# Patient Record
Sex: Female | Born: 1960 | Race: White | Hispanic: No | Marital: Married | Smoking: Current every day smoker
Health system: Southern US, Community
[De-identification: ages and names within clinical notes are randomized; demographics above are authoritative.]

## PROBLEM LIST (undated history)

## (undated) DIAGNOSIS — Z86718 Personal history of other venous thrombosis and embolism: Secondary | ICD-10-CM

## (undated) DIAGNOSIS — I499 Cardiac arrhythmia, unspecified: Secondary | ICD-10-CM

## (undated) DIAGNOSIS — E119 Type 2 diabetes mellitus without complications: Secondary | ICD-10-CM

## (undated) DIAGNOSIS — R079 Chest pain, unspecified: Secondary | ICD-10-CM

## (undated) DIAGNOSIS — M51369 Other intervertebral disc degeneration, lumbar region without mention of lumbar back pain or lower extremity pain: Secondary | ICD-10-CM

## (undated) DIAGNOSIS — R413 Other amnesia: Secondary | ICD-10-CM

## (undated) DIAGNOSIS — R51 Headache: Secondary | ICD-10-CM

## (undated) DIAGNOSIS — J189 Pneumonia, unspecified organism: Secondary | ICD-10-CM

## (undated) DIAGNOSIS — G950 Syringomyelia and syringobulbia: Secondary | ICD-10-CM

## (undated) DIAGNOSIS — Z720 Tobacco use: Secondary | ICD-10-CM

## (undated) DIAGNOSIS — IMO0001 Reserved for inherently not codable concepts without codable children: Secondary | ICD-10-CM

## (undated) DIAGNOSIS — K644 Residual hemorrhoidal skin tags: Secondary | ICD-10-CM

## (undated) DIAGNOSIS — E785 Hyperlipidemia, unspecified: Secondary | ICD-10-CM

## (undated) DIAGNOSIS — G4700 Insomnia, unspecified: Secondary | ICD-10-CM

## (undated) DIAGNOSIS — Z87442 Personal history of urinary calculi: Secondary | ICD-10-CM

## (undated) DIAGNOSIS — R5383 Other fatigue: Secondary | ICD-10-CM

## (undated) DIAGNOSIS — Z9981 Dependence on supplemental oxygen: Secondary | ICD-10-CM

## (undated) DIAGNOSIS — J449 Chronic obstructive pulmonary disease, unspecified: Secondary | ICD-10-CM

## (undated) DIAGNOSIS — D649 Anemia, unspecified: Secondary | ICD-10-CM

## (undated) DIAGNOSIS — R251 Tremor, unspecified: Secondary | ICD-10-CM

## (undated) DIAGNOSIS — Z8679 Personal history of other diseases of the circulatory system: Secondary | ICD-10-CM

## (undated) DIAGNOSIS — T8859XA Other complications of anesthesia, initial encounter: Secondary | ICD-10-CM

## (undated) DIAGNOSIS — R011 Cardiac murmur, unspecified: Secondary | ICD-10-CM

## (undated) DIAGNOSIS — Z8719 Personal history of other diseases of the digestive system: Secondary | ICD-10-CM

## (undated) DIAGNOSIS — J40 Bronchitis, not specified as acute or chronic: Secondary | ICD-10-CM

## (undated) DIAGNOSIS — G47419 Narcolepsy without cataplexy: Secondary | ICD-10-CM

## (undated) DIAGNOSIS — G479 Sleep disorder, unspecified: Secondary | ICD-10-CM

## (undated) DIAGNOSIS — R0600 Dyspnea, unspecified: Secondary | ICD-10-CM

## (undated) DIAGNOSIS — J45909 Unspecified asthma, uncomplicated: Secondary | ICD-10-CM

## (undated) DIAGNOSIS — M5136 Other intervertebral disc degeneration, lumbar region: Secondary | ICD-10-CM

## (undated) DIAGNOSIS — M4802 Spinal stenosis, cervical region: Secondary | ICD-10-CM

## (undated) DIAGNOSIS — L98499 Non-pressure chronic ulcer of skin of other sites with unspecified severity: Secondary | ICD-10-CM

## (undated) DIAGNOSIS — K219 Gastro-esophageal reflux disease without esophagitis: Secondary | ICD-10-CM

## (undated) DIAGNOSIS — T7840XA Allergy, unspecified, initial encounter: Secondary | ICD-10-CM

## (undated) DIAGNOSIS — R0902 Hypoxemia: Secondary | ICD-10-CM

## (undated) DIAGNOSIS — G894 Chronic pain syndrome: Secondary | ICD-10-CM

## (undated) DIAGNOSIS — F418 Other specified anxiety disorders: Secondary | ICD-10-CM

## (undated) DIAGNOSIS — I10 Essential (primary) hypertension: Secondary | ICD-10-CM

## (undated) DIAGNOSIS — G4733 Obstructive sleep apnea (adult) (pediatric): Secondary | ICD-10-CM

## (undated) HISTORY — DX: Hypoxemia: R09.02

## (undated) HISTORY — DX: Bronchitis, not specified as acute or chronic: J40

## (undated) HISTORY — DX: Obstructive sleep apnea (adult) (pediatric): G47.33

## (undated) HISTORY — DX: Other amnesia: R41.3

## (undated) HISTORY — DX: Gastro-esophageal reflux disease without esophagitis: K21.9

## (undated) HISTORY — DX: Narcolepsy without cataplexy: G47.419

## (undated) HISTORY — DX: Syringomyelia and syringobulbia: G95.0

## (undated) HISTORY — PX: TONSILLECTOMY: SUR1361

## (undated) HISTORY — PX: OOPHORECTOMY: SHX86

## (undated) HISTORY — DX: Unspecified asthma, uncomplicated: J45.909

## (undated) HISTORY — DX: Headache: R51

## (undated) HISTORY — DX: Other specified anxiety disorders: F41.8

## (undated) HISTORY — PX: OTHER SURGICAL HISTORY: SHX169

## (undated) HISTORY — DX: Allergy, unspecified, initial encounter: T78.40XA

## (undated) HISTORY — DX: Chronic pain syndrome: G89.4

## (undated) HISTORY — DX: Other fatigue: R53.83

## (undated) HISTORY — DX: Chronic obstructive pulmonary disease, unspecified: J44.9

## (undated) HISTORY — DX: Essential (primary) hypertension: I10

## (undated) HISTORY — PX: CHOLECYSTECTOMY: SHX55

## (undated) HISTORY — DX: Residual hemorrhoidal skin tags: K64.4

## (undated) HISTORY — DX: Reserved for inherently not codable concepts without codable children: IMO0001

## (undated) HISTORY — DX: Insomnia, unspecified: G47.00

## (undated) HISTORY — DX: Other intervertebral disc degeneration, lumbar region without mention of lumbar back pain or lower extremity pain: M51.369

## (undated) HISTORY — DX: Hyperlipidemia, unspecified: E78.5

## (undated) HISTORY — DX: Sleep disorder, unspecified: G47.9

## (undated) HISTORY — DX: Non-pressure chronic ulcer of skin of other sites with unspecified severity: L98.499

## (undated) HISTORY — DX: Chest pain, unspecified: R07.9

## (undated) HISTORY — DX: Other intervertebral disc degeneration, lumbar region: M51.36

## (undated) HISTORY — PX: ABDOMINAL HYSTERECTOMY: SHX81

## (undated) HISTORY — DX: Spinal stenosis, cervical region: M48.02

## (undated) HISTORY — PX: TUBAL LIGATION: SHX77

## (undated) HISTORY — DX: Tremor, unspecified: R25.1

## (undated) HISTORY — PX: HERNIA REPAIR: SHX51

## (undated) HISTORY — DX: Tobacco use: Z72.0

---

## 1987-07-28 DIAGNOSIS — Z9289 Personal history of other medical treatment: Secondary | ICD-10-CM

## 1987-07-28 HISTORY — DX: Personal history of other medical treatment: Z92.89

## 1990-07-27 HISTORY — PX: APPENDECTOMY: SHX54

## 1990-07-27 HISTORY — PX: DIAGNOSTIC LAPAROSCOPY: SUR761

## 1990-07-27 HISTORY — PX: CHOLECYSTECTOMY: SHX55

## 2002-02-06 ENCOUNTER — Ambulatory Visit (HOSPITAL_COMMUNITY): Admission: RE | Admit: 2002-02-06 | Discharge: 2002-02-06 | Payer: Self-pay | Admitting: Family Medicine

## 2002-02-06 ENCOUNTER — Encounter: Payer: Self-pay | Admitting: Family Medicine

## 2002-05-24 ENCOUNTER — Encounter: Payer: Self-pay | Admitting: Family Medicine

## 2002-05-24 ENCOUNTER — Ambulatory Visit (HOSPITAL_COMMUNITY): Admission: RE | Admit: 2002-05-24 | Discharge: 2002-05-24 | Payer: Self-pay | Admitting: Family Medicine

## 2003-05-22 ENCOUNTER — Ambulatory Visit (HOSPITAL_COMMUNITY): Admission: RE | Admit: 2003-05-22 | Discharge: 2003-05-22 | Payer: Self-pay | Admitting: Obstetrics & Gynecology

## 2004-01-24 ENCOUNTER — Ambulatory Visit (HOSPITAL_COMMUNITY): Admission: RE | Admit: 2004-01-24 | Discharge: 2004-01-24 | Payer: Self-pay | Admitting: Family Medicine

## 2004-06-09 ENCOUNTER — Ambulatory Visit: Payer: Self-pay | Admitting: Orthopedic Surgery

## 2004-06-20 ENCOUNTER — Encounter: Admission: RE | Admit: 2004-06-20 | Discharge: 2004-06-20 | Payer: Self-pay | Admitting: Orthopedic Surgery

## 2004-07-04 ENCOUNTER — Encounter: Admission: RE | Admit: 2004-07-04 | Discharge: 2004-07-04 | Payer: Self-pay | Admitting: Orthopedic Surgery

## 2004-11-06 ENCOUNTER — Encounter
Admission: RE | Admit: 2004-11-06 | Discharge: 2005-02-04 | Payer: Self-pay | Admitting: Physical Medicine & Rehabilitation

## 2004-12-02 ENCOUNTER — Ambulatory Visit: Payer: Self-pay | Admitting: Physical Medicine & Rehabilitation

## 2005-03-26 ENCOUNTER — Ambulatory Visit (HOSPITAL_COMMUNITY): Admission: RE | Admit: 2005-03-26 | Discharge: 2005-03-26 | Payer: Self-pay | Admitting: Obstetrics & Gynecology

## 2005-11-19 ENCOUNTER — Ambulatory Visit (HOSPITAL_COMMUNITY): Admission: RE | Admit: 2005-11-19 | Discharge: 2005-11-19 | Payer: Self-pay | Admitting: Family Medicine

## 2005-11-30 ENCOUNTER — Ambulatory Visit (HOSPITAL_COMMUNITY): Admission: RE | Admit: 2005-11-30 | Discharge: 2005-11-30 | Payer: Self-pay | Admitting: Unknown Physician Specialty

## 2006-03-09 ENCOUNTER — Emergency Department (HOSPITAL_COMMUNITY): Admission: EM | Admit: 2006-03-09 | Discharge: 2006-03-09 | Payer: Self-pay | Admitting: Emergency Medicine

## 2006-08-16 ENCOUNTER — Ambulatory Visit (HOSPITAL_COMMUNITY): Admission: RE | Admit: 2006-08-16 | Discharge: 2006-08-16 | Payer: Self-pay | Admitting: Family Medicine

## 2006-09-26 ENCOUNTER — Encounter: Admission: RE | Admit: 2006-09-26 | Discharge: 2006-09-26 | Payer: Self-pay | Admitting: Gastroenterology

## 2007-03-08 ENCOUNTER — Ambulatory Visit (HOSPITAL_COMMUNITY): Admission: RE | Admit: 2007-03-08 | Discharge: 2007-03-08 | Payer: Self-pay | Admitting: Family Medicine

## 2007-07-07 ENCOUNTER — Ambulatory Visit (HOSPITAL_COMMUNITY): Admission: RE | Admit: 2007-07-07 | Discharge: 2007-07-07 | Payer: Self-pay | Admitting: Family Medicine

## 2007-09-05 ENCOUNTER — Other Ambulatory Visit: Admission: RE | Admit: 2007-09-05 | Discharge: 2007-09-05 | Payer: Self-pay | Admitting: Obstetrics & Gynecology

## 2007-12-02 ENCOUNTER — Ambulatory Visit (HOSPITAL_COMMUNITY): Admission: RE | Admit: 2007-12-02 | Discharge: 2007-12-02 | Payer: Self-pay | Admitting: Family Medicine

## 2008-01-24 ENCOUNTER — Ambulatory Visit (HOSPITAL_COMMUNITY): Admission: RE | Admit: 2008-01-24 | Discharge: 2008-01-24 | Payer: Self-pay | Admitting: Family Medicine

## 2009-05-28 ENCOUNTER — Ambulatory Visit (HOSPITAL_COMMUNITY): Admission: RE | Admit: 2009-05-28 | Discharge: 2009-05-28 | Payer: Self-pay | Admitting: Obstetrics & Gynecology

## 2009-11-14 ENCOUNTER — Ambulatory Visit (HOSPITAL_COMMUNITY): Admission: RE | Admit: 2009-11-14 | Discharge: 2009-11-14 | Payer: Self-pay | Admitting: Family Medicine

## 2009-12-24 ENCOUNTER — Ambulatory Visit: Payer: Self-pay | Admitting: Internal Medicine

## 2009-12-24 DIAGNOSIS — R1319 Other dysphagia: Secondary | ICD-10-CM | POA: Insufficient documentation

## 2009-12-24 DIAGNOSIS — R198 Other specified symptoms and signs involving the digestive system and abdomen: Secondary | ICD-10-CM | POA: Insufficient documentation

## 2009-12-24 DIAGNOSIS — K219 Gastro-esophageal reflux disease without esophagitis: Secondary | ICD-10-CM | POA: Insufficient documentation

## 2009-12-27 ENCOUNTER — Encounter: Payer: Self-pay | Admitting: Internal Medicine

## 2009-12-30 ENCOUNTER — Ambulatory Visit (HOSPITAL_COMMUNITY): Admission: RE | Admit: 2009-12-30 | Discharge: 2009-12-30 | Payer: Self-pay | Admitting: Internal Medicine

## 2009-12-31 ENCOUNTER — Encounter: Payer: Self-pay | Admitting: Internal Medicine

## 2010-01-01 ENCOUNTER — Encounter: Payer: Self-pay | Admitting: Internal Medicine

## 2010-01-07 ENCOUNTER — Encounter: Payer: Self-pay | Admitting: Internal Medicine

## 2010-04-07 ENCOUNTER — Encounter: Payer: Self-pay | Admitting: Internal Medicine

## 2010-04-16 ENCOUNTER — Encounter: Payer: Self-pay | Admitting: Internal Medicine

## 2010-05-12 ENCOUNTER — Ambulatory Visit (HOSPITAL_COMMUNITY): Admission: RE | Admit: 2010-05-12 | Discharge: 2010-05-12 | Payer: Self-pay | Admitting: Internal Medicine

## 2010-06-11 ENCOUNTER — Encounter: Payer: Self-pay | Admitting: Internal Medicine

## 2010-08-17 ENCOUNTER — Encounter: Payer: Self-pay | Admitting: Unknown Physician Specialty

## 2010-08-17 ENCOUNTER — Encounter: Payer: Self-pay | Admitting: Internal Medicine

## 2010-08-17 ENCOUNTER — Encounter: Payer: Self-pay | Admitting: Family Medicine

## 2010-08-26 NOTE — Assessment & Plan Note (Signed)
Summary: DIVERTICULOSIS/FAMILY HX OF CRC/SS   Visit Type:  Initial Consult Primary Care Provider:  cresenzo  Chief Complaint:  change in bowel habits.  History of Present Illness: Very pleasant 50 year old lady with syringomyelia referred by Dr. Matilde Haymaker. to further evaluate recent abdominal pain change in bowel habits and a question of diverticulitis on the CT and a positive family history of colon cancer.  Patient states she has been constipated all of her life, however, over the past 6 months she has had intermittent explosive bouts of diarrhea. At other times she may go 10 days without a bowel movement ;has to take large doses of MiraLax to accomplish a BM. She has not passed any blood. She complains of quite a bit of yellow mucus in her stool from time to time. She has vague bilateral lower quadrant abdominal pain not necessarily relieved with having a bowel movement. She was recently treated for presumed diverticulitis; she had predominantly left-sided pain CT of abdomen and pelvis suggested possible inflammatory process around the descending colon. The acute symptoms subside. She denies weight loss. She does have almost daily reflux and regurgitation. She describes having an EGD in [place] for 5 years ago was told she had GERD. She's not on acid suppression therapy. Although she eats Pepcid and TUMS every day as she describes. She also describes frequent episodes of esophageal dysphagia and has had multiple episodes of transient due to impactions by report.  She's had major issues with her syringomyelia; she is on multiple medications and her including narcotics for management of this problem. She is followed by the [facility] neurological  clinic and [place], [place].  She tells me that she has a  difficult time waking up after sedation. She gives a history of narcolepsy, sleep apnea and the syringomyelia making sedation challenging proposition. She tells me that she had a a low  oxygen saturation during her EGD in [place] previously.  Preventive Screening-Counseling & Management  Alcohol-Tobacco     Smoking Status: current  Current Medications (verified): 1)  Lyrica 200 Mg Caps (Pregabalin) .... Three Times A Day 2)  Tizandine .... Once Daily 3)  Baclofen 10 Mg Tabs (Baclofen) .... Once Daily 4)  Lipitor 20 Mg Tabs (Atorvastatin Calcium) .... Once Daily 5)  Estradiol Valerate 10 Mg/ml Oil (Estradiol Valerate) .... Once Daily 6)  Proventil Hfa 108 (90 Base) Mcg/act Aers (Albuterol Sulfate) .... Once Daily 7)  Oxycontin 20 Mg Xr12h-Tab (Oxycodone Hcl) .... Once Daily and As Needed 8)  Singulair 10 Mg Tabs (Montelukast Sodium) .... Once Daily 9)  Percocet 10-325 Mg Tabs (Oxycodone-Acetaminophen) .... As Needed  Allergies (verified): 1)  ! Avelox  Past History:  Past Medical History: nerve damage rectocile diarrhea kidney stones asthma narcolepsy sleep apnea  Past Surgical History: hysterectomy gallbladder removed tonsils  Family History: Father: ? Mother: deceased- crc Siblings: 1 sister Family History of Colon Cancer:mother  Social History: Marital Status: Married Children: 5 Occupation: no Patient currently smokes.  Alcohol Use - no Smoking Status:  current  Vital Signs:  Patient profile:   50 year old female Height:      64 inches Weight:      165 pounds BMI:     28.42 Temp:     97.7 degrees F oral Pulse rate:   68 / minute BP sitting:   104 / 70  (left arm) Cuff size:   regular  Vitals Entered By: Hendricks Limes LPN (Dec 24, 2009 2:59 PM)  Physical Exam  General:  pleasant alert lady who has little difficult time ambulating Eyes:  no scleral icterus. Conjunctiva are pink Lungs:  clear to auscultation Heart:  regular rate rhythm without murmur gallop rub Abdomen:  flat positive bowel sounds soft nontender without obvious mass or organomegaly Rectal:  deferred until time of colonoscopy  Impression &  Recommendations: Impression: Pleasant 50 year old lady with syringomyelia referred for recent abdominal pain and change in bowel habits which are somewhat vague over six-month duration.  Recent CT implied some mild inflammatory changes about the descending colon. She was given antibiotics. She continues to have intermittent constipation and diarrhea has not had any rectal bleeding, however. She has a positive family history of colon cancer in her mother (age 92).  Bowel symptoms are non-specific. She has wide swings in bowel function. This may be in part due to a neurogenic bowel in evolution, narcotic effect or even occasional spurious diarrhea around a transient fecal impaction.  As a separate issue, she has daily reflux symptoms, very poorly controlled and describes esophageal dysphagia and transient food impactions from time to time. These  symptoms deserve further evaluation as well.  Recommendations: In addition to a diagnostic / high risk screening colonoscopy in the near future, I offered this nicely an EGD with esophageal dilation as appropriate.  Given her neurologic condition, polypharmacy, history of sleep apnea and narcolepsy along with difficulties with sedation with her EGD previously, I feel it would be best to enlist  the services of Dr. Jayme Cloud with [facility] in the OR for these procedures.  I discussed all including the risks, benefits, limitations, imponderables and alternatives with this nicely. Her questions have been answered she is agreeable .  Appended Document: Orders Update    Clinical Lists Changes  Problems: Added new problem of GERD (ICD-530.81) Added new problem of DYSPHAGIA (ICD-787.29) Added new problem of OTHER SYMPTOMS INVOLVING DIGESTIVE SYSTEM OTHER (ICD-787.99) Orders: Added new Service order of Consultation Level IV (16109) - Signed      Appended Document: DIVERTICULOSIS/FAMILY HX OF CRC/SS spoke to Dr. Jayme Cloud about the anesthesia for this nice lady. He  told me he does not want to give this lady anesthesia and she is most complicated given her multiple medical problems in her neurologic status, he feels she needs to be done at a Medical Center. He strongly suggested we canceled the procedure here. I have done so.  He recommended further evaluation and endoscopic evaluation appropriate a tertiary referral center.    I have called in as her and left her a message for her to return a call to the office so we can convey this information to this nicely. I recommend the next best thing would be a referral in the GI clinic at [facility]. Please notify [facility] of the situation.  Appended Document: DIVERTICULOSIS/FAMILY HX OF CRC/SS Referral faxed to [facility].  Appended Document: DIVERTICULOSIS/FAMILY HX OF CRC/SS pt aware, she is requesting a facility closer to [place] if available. She said her driver lived in [place] and it would be easier, but if there is not one she would be willing to go to [facility].  Appended Document: DIVERTICULOSIS/FAMILY HX OF CRC/SS Pt wanted to know if there was a Medical Center in [place] you could recommend..Please advise?  Appended Document: DIVERTICULOSIS/FAMILY HX OF CRC/SS could try [facility] w [facility] - I do not know of any one individual.  Appended Document: DIVERTICULOSIS/FAMILY HX OF CRC/SS Referral faxed to GI Clinic in [place].

## 2010-08-26 NOTE — Letter (Signed)
Summary: [facility] NOTES  [facility] NOTES   Imported By: Rexene Alberts 04/07/2010 12:03:03  _____________________________________________________________________  External Attachment:    Type:   Image     Comment:   External Document

## 2010-08-26 NOTE — Letter (Signed)
Summary: External Other  External Other   Imported By: Peggyann Shoals 01/07/2010 10:06:48  _____________________________________________________________________  External Attachment:    Type:   Image     Comment:   External Document

## 2010-08-26 NOTE — Letter (Signed)
Summary: CLINIC NOTE FROM [facility]  CLINIC NOTE FROM [facility]   Imported By: Rexene Alberts 04/16/2010 14:57:32  _____________________________________________________________________  External Attachment:    Type:   Image     Comment:   External Document

## 2010-08-26 NOTE — Letter (Signed)
Summary: LABS FROM [facility]  LABS FROM [facility]   Imported By: Rexene Alberts 06/11/2010 15:18:36  _____________________________________________________________________  External Attachment:    Type:   Image     Comment:   External Document

## 2010-08-26 NOTE — Letter (Signed)
Summary: [facility]  [facility]   Imported By: Ave Filter 01/01/2010 09:55:49  _____________________________________________________________________  External Attachment:    Type:   Image     Comment:   External Document  Appended Document: [facility] Per Britta Mccreedy pt needs to be seen at [facility].

## 2010-08-26 NOTE — Letter (Signed)
Summary: Internal Other /TCS/EGD ? ED  Internal Other /TCS/EGD ? ED   Imported By: Cloria Spring LPN 16/04/9603 54:09:81  _____________________________________________________________________  External Attachment:    Type:   Image     Comment:   External Document

## 2010-08-26 NOTE — Letter (Signed)
Summary: [facility] REFERRAL  [facility] REFERRAL   Imported By: Ave Filter 12/31/2009 13:35:22  _____________________________________________________________________  External Attachment:    Type:   Image     Comment:   External Document  Appended Document: [facility] REFERRAL Pt scheduled for ov at [facility] 02/12/10@1 :15p.m.  Pt aware of appt.

## 2010-10-02 ENCOUNTER — Encounter (HOSPITAL_COMMUNITY): Payer: Self-pay

## 2010-10-02 ENCOUNTER — Ambulatory Visit (HOSPITAL_COMMUNITY)
Admission: RE | Admit: 2010-10-02 | Discharge: 2010-10-02 | Disposition: A | Discharge status: Home / Self Care | Payer: Medicaid Other | Source: Ambulatory Visit | Attending: Internal Medicine | Admitting: Internal Medicine

## 2010-10-02 ENCOUNTER — Other Ambulatory Visit (HOSPITAL_COMMUNITY): Payer: Self-pay | Admitting: Internal Medicine

## 2010-10-02 DIAGNOSIS — R1032 Left lower quadrant pain: Secondary | ICD-10-CM | POA: Insufficient documentation

## 2010-10-02 DIAGNOSIS — R52 Pain, unspecified: Secondary | ICD-10-CM

## 2010-10-02 DIAGNOSIS — R1110 Vomiting, unspecified: Secondary | ICD-10-CM

## 2010-10-02 DIAGNOSIS — R112 Nausea with vomiting, unspecified: Secondary | ICD-10-CM | POA: Insufficient documentation

## 2010-10-02 DIAGNOSIS — R197 Diarrhea, unspecified: Secondary | ICD-10-CM | POA: Insufficient documentation

## 2010-10-13 LAB — BASIC METABOLIC PANEL
BUN: 8 mg/dL (ref 6–23)
CO2: 30 mEq/L (ref 19–32)
Calcium: 9.3 mg/dL (ref 8.4–10.5)
Chloride: 102 mEq/L (ref 96–112)
Creatinine, Ser: 0.75 mg/dL (ref 0.4–1.2)
GFR calc Af Amer: >60 mL/min (ref >60)
GFR calc non Af Amer: >60 mL/min (ref >60)
Glucose, Bld: 115 mg/dL — ABNORMAL HIGH (ref 70–99)
Potassium: 3.8 mEq/L (ref 3.5–5.1)
Sodium: 140 mEq/L (ref 135–145)

## 2010-10-13 LAB — CBC
HCT: 42.5 % (ref 36.0–46.0)
Hemoglobin: 14.8 g/dL (ref 12.0–15.0)
MCHC: 34.7 g/dL (ref 30.0–36.0)
MCV: 91.9 fL (ref 78.0–100.0)
Platelets: 257 10*3/uL (ref 150–400)
RBC: 4.63 MIL/uL (ref 3.87–5.11)
RDW: 13.6 % (ref 11.5–15.5)
WBC: 7.4 10*3/uL (ref 4.0–10.5)

## 2010-12-12 NOTE — Group Therapy Note (Signed)
DATE OF SERVICE:  Dec 02, 2004.   DATE OF BIRTH:  03-Nov-1960.   MEDICAL RECORD NUMBER:  [id number].   HISTORY:  A 50 year old female who had insidious onset of neck pain,  headaches and back pain starting around 1997-1998.  She was still employed  as a Radiation protection practitioner.  She does not have any history of trauma.  She states that  around that time she was living in [place].  She has seen at least  two different doctors there and diagnosed with migraines, although she  states that triptans did not work.  She was diagnosed with ankylosing  spondylitis a couple of years later.  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  She has had to do chest compressions at him  at home.  Her pain diagram has pain in her head, the back of her head and  neck, between the shoulder blades, as well as the low back, hands and feet.  Of these various pain areas, she states that her neck pain, upper back and  headache pain are the worse.  She states that she has two types of  headaches.  She had a nonfrontal headache that is nearly daily.  It involves  mostly other aspects of her head.  She also has what she terms are  migraines, which is a more exacerbation of her pain, but really no aura.  She has seen at least two neurologists.  She is seeing Dr. Gerilyn Pilgrim.  She  has had an MRI of her brain on January 24, 2004, which showed low cerebellar  tonsils, but no other significant abnormalities.  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  She  also had some right-sided C4-5 foraminal stenosis.  In addition, she has had  MRI of her right shoulder showing a right deltoid bursitis on May 24, 2002.  She has undergone  cervical epidural sternoid injections on July 04, 2004, and June 20, 2004, at C7-T1, which gave her about two weeks of  relief in each case.  She has had recent nerve conduction studies and sleep  study through Dr. Ronal Fear office.  She has seen the [facility]  chief, Velna Ochs, M.D., who records a normal neurologic examination.  CSF flow study showed some decreased flow into the back of her tonsils, but  this seemed out of proportion of what he would expect from her degree of  tonsillar herniation.  He suggested pain management.  The patient states  that overall she does not like taking pills.   MEDICATIONS:  1.  She does take one or two Endocet tablets a day.  2.  She has been taking about 12 Naprosyn a day and denies any abdominal      complaints related to this.  3.  She is on Effexor 75 mg two a day.  4.  Xanax 1 mg one-half to one t.i.d. p.r.n.  5.  Ambien 10 mg q.h.s.  6.  Flexeril scheduled as 10 mg t.i.d.  She really just takes this on a      p.r.n. basis.  She takes one or less tablets a day.  7.  Neurontin.  She has  been recommended to take 300 mg t.i.d., but she      states that when she started on this dosage, it made her too drowsy and      therefore she really just takes it p.r.n. at night and not even on a      daily basis.   ALLERGIES:  None known.   PAIN RATING:  Currently 5/10.  She states she averages 8/10.   PAIN INTERFERENCE SCORE:  General activity 8.  Relationships with other  people 8.  Enjoyment of life 10.  Her sleep quality is poor.  She states  that her pain medication relief is about 40%.  Pain is worse with walking,  bending, sitting and standing and improves with medication injections.  She  continues to drive.  She walks without assistance.  She cares for her son  and does all of his housework.   REVIEW OF SYSTEMS:  Positive for numbness, tremor, tingling, trouble  walking, dizziness, spasms, confusion, depression and anxiety,  but no  suicidal thoughts.  She has had some nausea, constipation, reflux and  gastritis symptoms.   PAST SURGICAL HISTORY:  1.  Tonsillectomy in 1964.  2.  Emergency D&C in 1986.  3.  Gallbladder removal in 1994.  4.  Complete hysterectomy in 2000.   PAST MEDICAL HISTORY:  She has had kidney stones in the past, but no surgery  for them.   SOCIAL HISTORY:  Separated.  Lives with autistic son.  One to two-pack per  day smoker.  States that she is down to one pack.   FAMILY HISTORY:  Heart disease, lung disease, high blood pressure and  cancer.   PHYSICAL EXAMINATION:  VITAL SIGNS:  Blood pressure 121/82, pulse 97,  respirations 16, O2 saturation 94%.  GENERAL APPEARANCE:  A well-developed female.  Mildly anxious.  Bright,  alert and oriented x 3.  Gait is normal for regular gait, but she loses her  balance with toe walking and heel walking, but has the muscular strength to  do this.  EXTREMITIES:  She has 5/5 strength in bilateral deltoids, biceps, triceps  and grip, as well as hip flexion, knee extension and ankle dorsiflexion.  She has full range of motion in bilateral hips, knees, ankles, shoulders,  elbows and wrists, as well as hands.  NECK:  Range of motion is 3/4 range of flexion and extension.  She has more  pain with extension than with flexion.  BACK:  She has no tenderness to palpation of bilateral upper trapezii, upper  back or lower back area.  Sensation is normal in bilateral upper and lower  extremities.  Her lumbar range of motion is limited to 25% flexion,  extension, lateral rotation and bending.  Very guarded with her motions.   IMPRESSION:  1.  Patient primarily with neck pain, partially relieved with cervical      epidural sternoid injections, increased pain with extension, but      otherwise no significant radicular symptoms.  I feel she may have      cervical facet arthropathy.  For this reason, I will send her to Celene Kras, M.D., to see if he  feels that cervical medial branch blocks are      indicated.  2.  Headaches.  I feel that she has two types.  She may have some medication      overuse headache and she is familiar with rebound headaches based on her  medication usage, particularly with nonsteroidal anti-inflammatories at      high dosages.  I did indicate that her Endocet even taken twice a day is      like taking Tylenol twice a day in addition to her NSAID.  In addition,      she may have migraine-type headaches versus tension headaches which      represent her more severe headaches.  Of note is that treatments for      migraines in the past with triptans did not help.  3.  She feels that she has some depression and in fact is taking      antidepressants at this time, but I think she would benefit from seeing      a pain psychologist.  4.  I would like to get some additional records on her as the records I have      are incomplete.  I would to particularly see nerve conduction studies      recently and the other neurological evaluations that she has had in the      past.  5.  Of note is that the patient has not been taking medications as directed      and even though she has may medications on her medication sheet, she is      really not taking them as directed.  She actually takes in general less      than are prescribed.  Given her poor tolerance of the 300 mg dose of      Neurontin, will reduce her to 100 mg t.i.d. to take on a regular basis      and also ask her to stop ibuprofen and Naprosyn and instead substitute      Celebrex 200 mg p.o. b.i.d.  If she continues to have reflux on this,      will either add some Protonix or take her off of this and try something      like tramadol.  I would plan to institute physical therapy in the      future.   I have explained a multimodal approach, need for close monitoring and  adjustments in medication and overall regimen of care.  She understands and  has no  questions.  This examination was done with nurse Desmond Lope in the  office.  I will see her back in one month.  She will see Celene Kras, M.D.,  in the meantime.      AEK/MedQ  D:  12/02/2004 13:40:50  T:  12/02/2004 15:17:29  Job #:  [id number]   cc:   Patrica Duel, M.D.  [address]  [place]  [place] [address]  Fax: [phone]   Velna Ochs, M.D.  [facility]   Kofi A. Gerilyn Pilgrim, M.D.  [address], [address]  [place]  [place] [address]  Fax: [phone]

## 2011-04-29 ENCOUNTER — Other Ambulatory Visit (HOSPITAL_COMMUNITY): Payer: Self-pay | Admitting: Internal Medicine

## 2011-04-29 DIAGNOSIS — G8929 Other chronic pain: Secondary | ICD-10-CM

## 2011-04-29 DIAGNOSIS — R51 Headache: Secondary | ICD-10-CM

## 2011-04-29 DIAGNOSIS — F411 Generalized anxiety disorder: Secondary | ICD-10-CM

## 2011-05-01 ENCOUNTER — Ambulatory Visit (HOSPITAL_COMMUNITY)
Admission: RE | Admit: 2011-05-01 | Discharge: 2011-05-01 | Disposition: A | Discharge status: Home / Self Care | Payer: Medicaid Other | Source: Ambulatory Visit | Attending: Internal Medicine | Admitting: Internal Medicine

## 2011-05-01 ENCOUNTER — Ambulatory Visit (HOSPITAL_COMMUNITY): Payer: Medicaid Other

## 2011-05-01 DIAGNOSIS — R4789 Other speech disturbances: Secondary | ICD-10-CM | POA: Insufficient documentation

## 2011-05-01 DIAGNOSIS — F411 Generalized anxiety disorder: Secondary | ICD-10-CM

## 2011-05-01 DIAGNOSIS — G8929 Other chronic pain: Secondary | ICD-10-CM

## 2011-05-01 DIAGNOSIS — R51 Headache: Secondary | ICD-10-CM

## 2012-08-10 ENCOUNTER — Encounter: Payer: Self-pay | Admitting: Cardiology

## 2012-08-10 ENCOUNTER — Encounter: Payer: Self-pay | Admitting: *Deleted

## 2012-08-10 ENCOUNTER — Ambulatory Visit (INDEPENDENT_AMBULATORY_CARE_PROVIDER_SITE_OTHER): Payer: Medicaid Other | Admitting: Cardiology

## 2012-08-10 VITALS — BP 140/94 | HR 89 | Ht 64.0 in | Wt 184.0 lb

## 2012-08-10 DIAGNOSIS — E1169 Type 2 diabetes mellitus with other specified complication: Secondary | ICD-10-CM | POA: Insufficient documentation

## 2012-08-10 DIAGNOSIS — R0609 Other forms of dyspnea: Secondary | ICD-10-CM

## 2012-08-10 DIAGNOSIS — R079 Chest pain, unspecified: Secondary | ICD-10-CM | POA: Insufficient documentation

## 2012-08-10 DIAGNOSIS — R0989 Other specified symptoms and signs involving the circulatory and respiratory systems: Secondary | ICD-10-CM

## 2012-08-10 DIAGNOSIS — F17200 Nicotine dependence, unspecified, uncomplicated: Secondary | ICD-10-CM

## 2012-08-10 DIAGNOSIS — Z720 Tobacco use: Secondary | ICD-10-CM

## 2012-08-10 DIAGNOSIS — E785 Hyperlipidemia, unspecified: Secondary | ICD-10-CM

## 2012-08-10 DIAGNOSIS — R0600 Dyspnea, unspecified: Secondary | ICD-10-CM

## 2012-08-10 LAB — CBC WITH DIFFERENTIAL/PLATELET
Basophils Absolute: 0 10*3/uL (ref 0.0–0.1)
Basophils Relative: 0.2 % (ref 0.0–3.0)
Eosinophils Absolute: 0.1 10*3/uL (ref 0.0–0.7)
Eosinophils Relative: 0.9 % (ref 0.0–5.0)
HCT: 50.5 % — ABNORMAL HIGH (ref 36.0–46.0)
Hemoglobin: 16.8 g/dL — ABNORMAL HIGH (ref 12.0–15.0)
Lymphocytes Relative: 20.2 % (ref 12.0–46.0)
Lymphs Abs: 1.9 10*3/uL (ref 0.7–4.0)
MCHC: 33.2 g/dL (ref 30.0–36.0)
MCV: 92.7 fl (ref 78.0–100.0)
Monocytes Absolute: 0.8 10*3/uL (ref 0.1–1.0)
Monocytes Relative: 8.8 % (ref 3.0–12.0)
Neutro Abs: 6.7 10*3/uL (ref 1.4–7.7)
Neutrophils Relative %: 69.9 % (ref 43.0–77.0)
Platelets: 289 10*3/uL (ref 150.0–400.0)
RBC: 5.45 Mil/uL — ABNORMAL HIGH (ref 3.87–5.11)
RDW: 15.3 % — ABNORMAL HIGH (ref 11.5–14.6)
WBC: 9.6 10*3/uL (ref 4.5–10.5)

## 2012-08-10 LAB — LIPID PANEL
Cholesterol: 204 mg/dL — ABNORMAL HIGH (ref 0–200)
HDL: 43.2 mg/dL (ref >39.00)
Total CHOL/HDL Ratio: 5
Triglycerides: 279 mg/dL — ABNORMAL HIGH (ref 0.0–149.0)
VLDL: 55.8 mg/dL — ABNORMAL HIGH (ref 0.0–40.0)

## 2012-08-10 LAB — HEPATIC FUNCTION PANEL
ALT: 27 U/L (ref 0–35)
AST: 26 U/L (ref 0–37)
Albumin: 4.2 g/dL (ref 3.5–5.2)
Alkaline Phosphatase: 76 U/L (ref 39–117)
Bilirubin, Direct: 0.1 mg/dL (ref 0.0–0.3)
Total Bilirubin: 0.5 mg/dL (ref 0.3–1.2)
Total Protein: 7.8 g/dL (ref 6.0–8.3)

## 2012-08-10 LAB — BASIC METABOLIC PANEL
BUN: 7 mg/dL (ref 6–23)
CO2: 31 mEq/L (ref 19–32)
Calcium: 10.1 mg/dL (ref 8.4–10.5)
Chloride: 102 mEq/L (ref 96–112)
Creatinine, Ser: 0.8 mg/dL (ref 0.4–1.2)
GFR: 82.36 mL/min (ref >60.00)
Glucose, Bld: 123 mg/dL — ABNORMAL HIGH (ref 70–99)
Potassium: 4.2 mEq/L (ref 3.5–5.1)
Sodium: 141 mEq/L (ref 135–145)

## 2012-08-10 LAB — LDL CHOLESTEROL, DIRECT: Direct LDL: 120 mg/dL

## 2012-08-10 NOTE — Progress Notes (Signed)
[name] Date of Birth: 1960/12/09 Medical Record #[id number]  History of Present Illness: Margaret Vaughn is seen at the request of Dr. Johnell Comings for evaluation of dyspnea, hypoxemia, and chest pain. She is a 52 year old white female who has a chronic pain syndrome. She relates this to syringomyelia. She has a history of chronic tobacco abuse and hyperlipidemia. She reports symptoms of chest pain. It sometimes feels like an elephant sitting on her chest and at other times feels like a tight band around her chest. She complains of shortness of breath with a mild nonproductive cough. She has wheezing. She complains of heartburn. She reports a labile blood pressure readings especially when her pain is not controlled. She states that she had a chemical stress test 3 years ago. She relates a prior history of mitral valve prolapse, PSVT, and atrial fibrillation but is somewhat vague on her prior workup and treatment. She complains of constant fatigue. She has had some edema in the past. She states that sometimes her legs swell so bad that they pit. Her activity is very limited. She relates an episode 5 or 6 months ago where she passed out in a drugstore. She had nausea, vomiting, and broke out in a cold sweat. She did go to the emergency room but the extent of her evaluation is unclear. She does have a history of obstructive sleep apnea and is on BiPAP. Previously her primary care was Dr. Foster Simpson in [place]. Since his retirement she states she does not have a primary care.  Current Outpatient Prescriptions on File Prior to Visit  Medication Sig Dispense Refill  . estradiol (ESTRACE) 2 MG tablet Take 2 mg by mouth daily.      . mirtazapine (REMERON) 30 MG tablet Take 30 mg by mouth 3 (three) times daily as needed.      . niacin-simvastatin (SIMCOR) 500-20 MG 24 hr tablet Take 1 tablet by mouth at bedtime.      . pregabalin (LYRICA) 200 MG capsule Take 200 mg by mouth daily.         Allergies    Allergen Reactions  . Iohexol      Desc: ANAPHYLAXSIS? PT.GIVEN OMNI 300 FOR CT ANGIO WAS GIVEN 50 MG.BENADRYL PRIOR TO SCAN HAD NO PROBLEMS   . Levofloxacin   . Moxifloxacin   . Shellfish Allergy     Hives     Past Medical History  Diagnosis Date  . Ulcer disease   . Sleeping difficulty   . Asthma   . HTN (hypertension)   . Headache   . Bronchitis   . Hypoxemia   . Myalgia and myositis   . Depression with anxiety   . OSA (obstructive sleep apnea)   . Chronic pain syndrome   . Fatigue   . Insomnia   . Narcolepsy   . Memory loss   . Hyperlipidemia     Past Surgical History  Procedure Date  . Cholecystectomy   . Abdominal hysterectomy     History  Smoking status  . Current Every Day Smoker -- 1.0 packs/day for 30 years  . Types: Cigarettes  Smokeless tobacco  . Not on file    History  Alcohol Use No    Family History  Problem Relation Age of Onset  . Ulcers Mother   . Sleep apnea Mother   . Migraines Mother   . Cancer Mother   . Arthritis Mother   . Hypertension Mother   . Heart disease Mother   .  Arrhythmia Son     Review of Systems: The review of systems is positive for chronic stress.she cares for a son who is autistic and has some type of rare congenital heart defect. She reports that she has run out of all her medications but she actually told my nurse that she was taking her medications.  All other systems were reviewed and are negative.  Physical Exam: BP 140/94  Pulse 89  Ht 5\' 4"  (1.626 m)  Wt 184 lb (83.462 kg)  BMI 31.58 kg/m2Oxygen saturation is 93%. She is a chronically ill-appearing white female who appears much older than her stated age. HEENT: Normocephalic, atraumatic. Pupils are equal round and reactive. Extraocular movements are full. Oropharynx is clear. Neck is supple no jugular venous distention, adenopathy, thyromegaly, or bruits. Lungs: Diffuse coarse rhonchi and wheezes. Cardiovascular: Regular rate and rhythm, normal  S1 and S2, no gallop, murmur, or click. Abdomen: Overweight, soft, nontender. No masses or hepatosplenomegaly. No bruits. Extremities: No cyanosis, clubbing, or edema. Pedal pulses are palpable. Patient is missing several fingernails which she states she has pulled out. Skin: Warm and dry. Neuro: Alert and oriented x3. Cranial nerves II through XII are intact. No focal findings. Mood is depressed.  LABORATORY DATA: ECG demonstrates normal sinus rhythm with a rate of 89 beats per minute. It is a normal ECG.  Assessment / Plan: 1. Dyspnea. I think her symptoms are most related to COPD and bronchospastic pulmonary disease. This is related to her chronic tobacco abuse. She may have chronic bronchitis. I have offered referral to pulmonary for further evaluation. We will obtain an echocardiogram to rule out structural heart disease. While she has a history of mitral valve prolapse I do not hear a significant murmur. I recommended smoking cessation.  2. Chest pain. Patient has multiple cardiac risk factors including hypercholesterolemia, tobacco abuse, and family history of early coronary disease. I recommended a LexiScan Myoview study to further define her cardiac risk.  3. Hypercholesterolemia. It is listed that she is taking Simcor. We will obtain fasting blood work today including chemistries, CBC, and lipid panel.  4. Chronic pain syndrome  5. Depression/anxiety/dysthymic disorder  6. Tobacco abuse.

## 2012-08-10 NOTE — Patient Instructions (Addendum)
Lab work today    Your physician has requested that you have an echocardiogram. Echocardiography is a painless test that uses sound waves to create images of your heart. It provides your doctor with information about the size and shape of your heart and how well your heart's chambers and valves are working. This procedure takes approximately one hour. There are no restrictions for this procedure.   Need to schedule Lexiscan Myoview follow myoview instructions   Need to be referred to pulmonology

## 2012-08-16 ENCOUNTER — Encounter: Payer: Self-pay | Admitting: Cardiology

## 2012-08-17 ENCOUNTER — Ambulatory Visit (HOSPITAL_COMMUNITY): Payer: Medicaid Other | Attending: Cardiovascular Disease | Admitting: Radiology

## 2012-08-17 VITALS — BP 132/92 | Ht 64.0 in | Wt 190.0 lb

## 2012-08-17 DIAGNOSIS — R42 Dizziness and giddiness: Secondary | ICD-10-CM | POA: Insufficient documentation

## 2012-08-17 DIAGNOSIS — R0989 Other specified symptoms and signs involving the circulatory and respiratory systems: Secondary | ICD-10-CM | POA: Insufficient documentation

## 2012-08-17 DIAGNOSIS — Z720 Tobacco use: Secondary | ICD-10-CM

## 2012-08-17 DIAGNOSIS — R0789 Other chest pain: Secondary | ICD-10-CM | POA: Insufficient documentation

## 2012-08-17 DIAGNOSIS — I10 Essential (primary) hypertension: Secondary | ICD-10-CM | POA: Insufficient documentation

## 2012-08-17 DIAGNOSIS — E785 Hyperlipidemia, unspecified: Secondary | ICD-10-CM

## 2012-08-17 DIAGNOSIS — J45909 Unspecified asthma, uncomplicated: Secondary | ICD-10-CM | POA: Insufficient documentation

## 2012-08-17 DIAGNOSIS — R079 Chest pain, unspecified: Secondary | ICD-10-CM

## 2012-08-17 DIAGNOSIS — R0602 Shortness of breath: Secondary | ICD-10-CM

## 2012-08-17 DIAGNOSIS — F17200 Nicotine dependence, unspecified, uncomplicated: Secondary | ICD-10-CM | POA: Insufficient documentation

## 2012-08-17 DIAGNOSIS — R0609 Other forms of dyspnea: Secondary | ICD-10-CM | POA: Insufficient documentation

## 2012-08-17 DIAGNOSIS — R0600 Dyspnea, unspecified: Secondary | ICD-10-CM

## 2012-08-17 MED ORDER — TECHNETIUM TC 99M SESTAMIBI GENERIC - CARDIOLITE
11.0000 | Freq: Once | INTRAVENOUS | Status: AC | PRN
  Administered 2012-08-17: 11 via INTRAVENOUS

## 2012-08-17 MED ORDER — TECHNETIUM TC 99M SESTAMIBI GENERIC - CARDIOLITE
33.0000 | Freq: Once | INTRAVENOUS | Status: AC | PRN
  Administered 2012-08-17: 33 via INTRAVENOUS

## 2012-08-17 MED ORDER — REGADENOSON 0.4 MG/5ML IV SOLN
0.4000 mg | Freq: Once | INTRAVENOUS | Status: AC
  Administered 2012-08-17: 0.4 mg via INTRAVENOUS

## 2012-08-17 NOTE — Progress Notes (Signed)
[facility] SITE 3 NUCLEAR MED [address] [place], [place] [address] [phone]    Cardiology Nuclear Med Study  Margaret Vaughn is a 52 y.o. female     MRN : [id number]     DOB: Jul 27, 1961  Procedure Date: 08/17/2012  Nuclear Med Background Indication for Stress Test:  Evaluation for Ischemia History:  MVP, Asthma Cardiac Risk Factors: Family History - CAD, Hypertension, Lipids, Obesity and Smoker  Symptoms:  Chest Pressure.  (last date of chest discomfort 3 weeks ago), Diaphoresis, Dizziness, DOE, Fatigue, Nausea, Syncope and Vomiting   Nuclear Pre-Procedure Caffeine/Decaff Intake:  None NPO After: 6:00am   Lungs:  clear O2 Sat: 94% on room air. IV 0.9% NS with Angio Cath:  22g  IV Site: R Antecubital  IV Started by:  Bonnita Levan, RN  Chest Size (in):  40 Cup Size: G  Height: 5\' 4"  (1.626 m)  Weight:  190 lb (86.183 kg)  BMI:  Body mass index is 32.61 kg/(m^2). Tech Comments:  N/A    Nuclear Med Study 1 or 2 day study: 1 day  Stress Test Type:  Lexiscan  Reading MD: Charlton Haws, MD  Order Authorizing Provider:  Peter Swaziland, MD  Resting Radionuclide: Technetium 36m Sestamibi  Resting Radionuclide Dose: 11.0 mCi   Stress Radionuclide:  Technetium 44m Sestamibi  Stress Radionuclide Dose: 33.0 mCi           Stress Protocol Rest HR: 99 Stress HR: 123  Rest BP: 132/92 Stress BP: 141/92  Exercise Time (min): n/a METS: n/a   Predicted Max HR: 168 bpm % Max HR: 73.21 bpm Rate Pressure Product: 21308    Dose of Adenosine (mg):  n/a Dose of Lexiscan: 0.4 mg  Dose of Atropine (mg): n/a Dose of Dobutamine: n/a mcg/kg/min (at max HR)  Stress Test Technologist: Bonnita Levan, RN  Nuclear Technologist:  Domenic Polite, CNMT     Rest Procedure:  Myocardial perfusion imaging was performed at rest 45 minutes following the intravenous administration of Technetium 42m Sestamibi. Rest ECG: NSR - Normal EKG  Stress Procedure:  The patient received IV Lexiscan  0.4 mg over 15-seconds.  Technetium 19m Sestamibi injected at 30-seconds.  Quantitative spect images were obtained after a 45 minute delay. Stress ECG: No significant change from baseline ECG  QPS Raw Data Images:  Normal; no motion artifact; normal heart/lung ratio. Stress Images:  Normal homogeneous uptake in all areas of the myocardium. Rest Images:  Normal homogeneous uptake in all areas of the myocardium. Subtraction (SDS):  Normal Transient Ischemic Dilatation (Normal <1.22):  0.98 Lung/Heart Ratio (Normal <0.45):  0.29  Quantitative Gated Spect Images QGS EDV:  84 ml QGS ESV:  31 ml  Impression Exercise Capacity:  Lexiscan with no exercise. BP Response:  Normal blood pressure response. Clinical Symptoms:  Atypical chest pain. ECG Impression:  No significant ST segment change suggestive of ischemia. Comparison with Prior Nuclear Study: No images to compare  Overall Impression:  Normal stress nuclear study.  LV Ejection Fraction: 63%.  LV Wall Motion:  NL LV Function; NL Wall Motion   Charlton Haws

## 2012-08-25 ENCOUNTER — Institutional Professional Consult (permissible substitution): Payer: Medicaid Other | Admitting: Pulmonary Disease

## 2012-08-26 ENCOUNTER — Institutional Professional Consult (permissible substitution): Payer: Medicaid Other | Admitting: Pulmonary Disease

## 2012-08-26 ENCOUNTER — Other Ambulatory Visit (HOSPITAL_COMMUNITY): Payer: Medicaid Other

## 2012-09-02 ENCOUNTER — Other Ambulatory Visit (HOSPITAL_COMMUNITY): Payer: Medicaid Other

## 2012-09-13 ENCOUNTER — Institutional Professional Consult (permissible substitution): Payer: Medicaid Other | Admitting: Pulmonary Disease

## 2012-10-03 ENCOUNTER — Institutional Professional Consult (permissible substitution): Payer: Medicaid Other | Admitting: Pulmonary Disease

## 2012-10-03 ENCOUNTER — Other Ambulatory Visit (HOSPITAL_COMMUNITY): Payer: Medicaid Other

## 2012-12-23 ENCOUNTER — Other Ambulatory Visit: Payer: Self-pay | Admitting: *Deleted

## 2012-12-23 MED ORDER — EST ESTROGENS-METHYLTEST 0.625-1.25 MG PO TABS: 1.0000 | ORAL_TABLET | Freq: Every day | ORAL | Status: DC

## 2013-03-22 DIAGNOSIS — F341 Dysthymic disorder: Secondary | ICD-10-CM | POA: Insufficient documentation

## 2013-03-22 DIAGNOSIS — R413 Other amnesia: Secondary | ICD-10-CM | POA: Insufficient documentation

## 2013-05-10 ENCOUNTER — Telehealth (HOSPITAL_COMMUNITY): Payer: Self-pay | Admitting: Dietician

## 2013-05-10 NOTE — Telephone Encounter (Signed)
Received voicemail from  Burton of [facility] left at 1101. She reports pt is cancelling appointment today at 1100 due to death in the family. Advised that once pt is settled office will call back to reschedule.

## 2013-06-29 ENCOUNTER — Other Ambulatory Visit: Payer: Self-pay | Admitting: Obstetrics & Gynecology

## 2013-06-30 ENCOUNTER — Telehealth: Payer: Self-pay

## 2013-06-30 NOTE — Progress Notes (Unsigned)
Patient ID: Margaret Vaughn, female   DOB: 1960-09-27, 52 y.o.   MRN: [id number] Rx EST ESTROGENS-METHYLTES is ready for pt per Dr. Despina Hidden. Call pt today to let her know her Rx is OK to pick up and that cannot be faxed to her pharmacy. Pt stated that she does not drive anymore and was not going to be able to pick it up. Pt said that she did not have anybody to drive her or get it for her either. Advised pt to call office when she finds someone to help her. 06/30/13.

## 2013-08-17 NOTE — Telephone Encounter (Signed)
Closed encounter °

## 2013-11-23 DIAGNOSIS — G4733 Obstructive sleep apnea (adult) (pediatric): Secondary | ICD-10-CM | POA: Insufficient documentation

## 2013-12-13 ENCOUNTER — Telehealth: Payer: Self-pay | Admitting: Family Medicine

## 2013-12-14 NOTE — Telephone Encounter (Signed)
appts given with christy and patient to get records

## 2013-12-28 ENCOUNTER — Ambulatory Visit: Payer: Self-pay | Admitting: Family

## 2014-01-24 ENCOUNTER — Other Ambulatory Visit: Payer: Self-pay | Admitting: Obstetrics & Gynecology

## 2014-01-30 ENCOUNTER — Ambulatory Visit (INDEPENDENT_AMBULATORY_CARE_PROVIDER_SITE_OTHER): Payer: Medicaid Other | Admitting: Family

## 2014-01-30 ENCOUNTER — Encounter (INDEPENDENT_AMBULATORY_CARE_PROVIDER_SITE_OTHER): Payer: Self-pay

## 2014-01-30 ENCOUNTER — Encounter: Payer: Self-pay | Admitting: Family

## 2014-01-30 VITALS — BP 115/75 | HR 87 | Temp 97.0°F | Ht 64.0 in | Wt 176.0 lb

## 2014-01-30 DIAGNOSIS — M545 Low back pain, unspecified: Secondary | ICD-10-CM

## 2014-01-30 DIAGNOSIS — E1159 Type 2 diabetes mellitus with other circulatory complications: Secondary | ICD-10-CM | POA: Insufficient documentation

## 2014-01-30 DIAGNOSIS — I10 Essential (primary) hypertension: Secondary | ICD-10-CM | POA: Insufficient documentation

## 2014-01-30 DIAGNOSIS — K219 Gastro-esophageal reflux disease without esophagitis: Secondary | ICD-10-CM

## 2014-01-30 DIAGNOSIS — F411 Generalized anxiety disorder: Secondary | ICD-10-CM | POA: Insufficient documentation

## 2014-01-30 DIAGNOSIS — Z1321 Encounter for screening for nutritional disorder: Secondary | ICD-10-CM

## 2014-01-30 DIAGNOSIS — E785 Hyperlipidemia, unspecified: Secondary | ICD-10-CM

## 2014-01-30 NOTE — Patient Instructions (Signed)

## 2014-01-30 NOTE — Progress Notes (Signed)
Subjective:    Patient ID: Margaret Vaughn, female    DOB: 04-12-1961, 53 y.o.   MRN: [id number]  Hyperlipidemia This is a chronic problem. The current episode started more than 1 year ago. The problem is uncontrolled. Recent lipid tests were reviewed and are high. She has no history of diabetes or hypothyroidism. Factors aggravating her hyperlipidemia include smoking and fatty foods. Pertinent negatives include no leg pain, myalgias or shortness of breath. Current antihyperlipidemic treatment includes statins and fibric acid derivatives. The current treatment provides moderate improvement of lipids. Risk factors for coronary artery disease include dyslipidemia, hypertension and post-menopausal.  Hypertension This is a chronic problem. The current episode started more than 1 year ago. The problem has been resolved since onset. The problem is controlled. Associated symptoms include anxiety and peripheral edema ("at times when I took the lyric TID"). Pertinent negatives include no headaches, palpitations or shortness of breath. Risk factors for coronary artery disease include dyslipidemia, obesity, post-menopausal state and smoking/tobacco exposure. Past treatments include ACE inhibitors. The current treatment provides moderate improvement. There is no history of kidney disease, CAD/MI, heart failure or a thyroid problem. Identifiable causes of hypertension include sleep apnea.  Anxiety Presents for follow-up visit. Symptoms include excessive worry, nervous/anxious behavior and restlessness. Patient reports no depressed mood, irritability, palpitations, panic or shortness of breath. Symptoms occur rarely. The severity of symptoms is moderate. The symptoms are aggravated by family issues.   Her past medical history is significant for anxiety/panic attacks and depression. Past treatments include benzodiazephines and SSRIs. The treatment provided mild relief. Compliance with prior treatments has been  variable.   *Pt has a neurologists that manages all of her pain medications.    Review of Systems  Constitutional: Negative.  Negative for irritability.  HENT: Negative.   Eyes: Negative.   Respiratory: Negative.  Negative for shortness of breath.   Cardiovascular: Negative.  Negative for palpitations.  Gastrointestinal: Negative.   Endocrine: Negative.   Genitourinary: Negative.   Musculoskeletal: Negative.  Negative for myalgias.  Neurological: Negative.  Negative for headaches.  Hematological: Negative.   Psychiatric/Behavioral: The patient is nervous/anxious.   All other systems reviewed and are negative.      Objective:   Physical Exam  Vitals reviewed. Constitutional: She is oriented to person, place, and time. She appears well-developed and well-nourished. No distress.  HENT:  Head: Normocephalic and atraumatic.  Right Ear: External ear normal.  Left Ear: External ear normal.  Mouth/Throat: Oropharynx is clear and moist.  Eyes: Pupils are equal, round, and reactive to light.  Neck: Normal range of motion. Neck supple. No thyromegaly present.  Cardiovascular: Normal rate, regular rhythm, normal heart sounds and intact distal pulses.   No murmur heard. Pulmonary/Chest: Effort normal and breath sounds normal. No respiratory distress. She has no wheezes.  Abdominal: Soft. Bowel sounds are normal. She exhibits no distension. There is no tenderness.  Musculoskeletal: Normal range of motion. She exhibits no edema and no tenderness.  Neurological: She is alert and oriented to person, place, and time. She has normal reflexes. No cranial nerve deficit.  Skin: Skin is warm and dry.  Psychiatric: She has a normal mood and affect. Her behavior is normal. Judgment and thought content normal.      BP 115/75  Pulse 87  Temp(Src) 97 F (36.1 C) (Oral)  Ht 5' 4"  (1.626 m)  Wt 176 lb (79.833 kg)  BMI 30.20 kg/m2     Assessment & Plan:  1. Gastroesophageal reflux disease  without esophagitis  2. Hyperlipidemia - Lipid panel; Future  3. GAD (generalized anxiety disorder)  4. Essential hypertension, benign - CMP14+EGFR; Future  5. Encounter for vitamin deficiency screening - Vit D  25 hydroxy (rtn osteoporosis monitoring); Future  6. Bilateral low back pain without sciatica - Arthritis Panel; Future  Continue all meds Labs pending Keep all appointments with neurologists Health Maintenance reviewed Diet and exercise encouraged RTO 6 months  Evelina Dun, FNP

## 2014-06-15 ENCOUNTER — Encounter (HOSPITAL_COMMUNITY): Payer: Self-pay | Admitting: *Deleted

## 2014-06-15 ENCOUNTER — Emergency Department (HOSPITAL_COMMUNITY): Payer: Medicaid Other

## 2014-06-15 ENCOUNTER — Emergency Department (HOSPITAL_COMMUNITY)
Admission: EM | Admit: 2014-06-15 | Discharge: 2014-06-15 | Disposition: A | Discharge status: Home / Self Care | Payer: Medicaid Other | Attending: Emergency Medicine | Admitting: Emergency Medicine

## 2014-06-15 DIAGNOSIS — Z79891 Long term (current) use of opiate analgesic: Secondary | ICD-10-CM | POA: Insufficient documentation

## 2014-06-15 DIAGNOSIS — G894 Chronic pain syndrome: Secondary | ICD-10-CM | POA: Diagnosis not present

## 2014-06-15 DIAGNOSIS — E119 Type 2 diabetes mellitus without complications: Secondary | ICD-10-CM | POA: Diagnosis not present

## 2014-06-15 DIAGNOSIS — Z7951 Long term (current) use of inhaled steroids: Secondary | ICD-10-CM | POA: Diagnosis not present

## 2014-06-15 DIAGNOSIS — G47419 Narcolepsy without cataplexy: Secondary | ICD-10-CM | POA: Diagnosis not present

## 2014-06-15 DIAGNOSIS — Z720 Tobacco use: Secondary | ICD-10-CM | POA: Insufficient documentation

## 2014-06-15 DIAGNOSIS — G4700 Insomnia, unspecified: Secondary | ICD-10-CM | POA: Insufficient documentation

## 2014-06-15 DIAGNOSIS — G4733 Obstructive sleep apnea (adult) (pediatric): Secondary | ICD-10-CM | POA: Insufficient documentation

## 2014-06-15 DIAGNOSIS — F341 Dysthymic disorder: Secondary | ICD-10-CM | POA: Insufficient documentation

## 2014-06-15 DIAGNOSIS — I10 Essential (primary) hypertension: Secondary | ICD-10-CM | POA: Insufficient documentation

## 2014-06-15 DIAGNOSIS — R062 Wheezing: Secondary | ICD-10-CM

## 2014-06-15 DIAGNOSIS — E785 Hyperlipidemia, unspecified: Secondary | ICD-10-CM | POA: Diagnosis not present

## 2014-06-15 DIAGNOSIS — M79672 Pain in left foot: Secondary | ICD-10-CM

## 2014-06-15 DIAGNOSIS — Z872 Personal history of diseases of the skin and subcutaneous tissue: Secondary | ICD-10-CM | POA: Diagnosis not present

## 2014-06-15 DIAGNOSIS — Z862 Personal history of diseases of the blood and blood-forming organs and certain disorders involving the immune mechanism: Secondary | ICD-10-CM | POA: Insufficient documentation

## 2014-06-15 DIAGNOSIS — Z79899 Other long term (current) drug therapy: Secondary | ICD-10-CM | POA: Insufficient documentation

## 2014-06-15 DIAGNOSIS — J441 Chronic obstructive pulmonary disease with (acute) exacerbation: Secondary | ICD-10-CM | POA: Diagnosis not present

## 2014-06-15 HISTORY — DX: Personal history of other venous thrombosis and embolism: Z86.718

## 2014-06-15 HISTORY — DX: Type 2 diabetes mellitus without complications: E11.9

## 2014-06-15 MED ORDER — ALBUTEROL SULFATE (2.5 MG/3ML) 0.083% IN NEBU: 2.5000 mg | INHALATION_SOLUTION | RESPIRATORY_TRACT | Status: DC | PRN

## 2014-06-15 MED ORDER — ALBUTEROL SULFATE HFA 108 (90 BASE) MCG/ACT IN AERS: 1.0000 | INHALATION_SPRAY | Freq: Four times a day (QID) | RESPIRATORY_TRACT | Status: DC | PRN

## 2014-06-15 MED ORDER — OXYCODONE-ACETAMINOPHEN 5-325 MG PO TABS: 1.0000 | ORAL_TABLET | ORAL | Status: DC | PRN

## 2014-06-15 MED ORDER — IPRATROPIUM-ALBUTEROL 0.5-2.5 (3) MG/3ML IN SOLN
3.0000 mL | Freq: Once | RESPIRATORY_TRACT | Status: AC
  Administered 2014-06-15: 3 mL via RESPIRATORY_TRACT
  Filled 2014-06-15: qty 3

## 2014-06-15 MED ORDER — PREDNISONE 50 MG PO TABS: ORAL_TABLET | ORAL | Status: DC

## 2014-06-15 MED ORDER — ALBUTEROL SULFATE (2.5 MG/3ML) 0.083% IN NEBU: 2.5000 mg | INHALATION_SOLUTION | Freq: Once | RESPIRATORY_TRACT | Status: DC

## 2014-06-15 NOTE — Discharge Instructions (Signed)
X-ray showed no acute problems. Medication for pain, prednisone, albuterol inhaler, albuterol solution for nebulizer machine. Ankle brace. Ice to ankle.

## 2014-06-15 NOTE — ED Notes (Signed)
Patient with no complaints at this time. Respirations even and unlabored. Skin warm/dry. Discharge instructions reviewed with patient at this time. Patient given opportunity to voice concerns/ask questions. IV removed per policy and band-aid applied to site. Patient discharged at this time and left Emergency Department with steady gait.  

## 2014-06-15 NOTE — ED Notes (Addendum)
Pain lt foot, onset today,  Says she has been having problem with bronchitis.  Green sputum.  Onset yesterday.  Pt says she has had a blood clot in her foot in past and thinks that is the problem today

## 2014-06-15 NOTE — ED Provider Notes (Signed)
CSN: [id number]     Arrival date & time 06/15/14  1600 History  This chart was scribe for Margaret Christen, MD by Judithann Sauger, ED Scribe. The patient was seen in room APA11/APA11 and the patient's care was started at 4:51 PM.    Chief Complaint  Patient presents with  . Foot Pain   The history is provided by the patient. No language interpreter was used.   HPI Comments: Margaret Vaughn is a 53 y.o. female who presents to the Emergency Department complaining of a left foot pain onset today. She reports associated soreness to the area. She has had superficial thrombophlebitis in her feet in the past. She denies any recent  injuries. She reports taking aspirin daily. She reports being a current smoker. She complains of chronic cough and wheezing. No fever or chills. No thigh or posterior calf pain.   Past Medical History  Diagnosis Date  . Ulcer disease   . Sleeping difficulty   . Asthma   . HTN (hypertension)   . Headache(784.0)   . Bronchitis   . Hypoxemia   . Myalgia and myositis   . Depression with anxiety   . OSA (obstructive sleep apnea)   . Chronic pain syndrome   . Fatigue   . Insomnia   . Narcolepsy   . Memory loss   . Hyperlipidemia   . Tremor   . Diabetes mellitus without complication   . H/O blood clots    Past Surgical History  Procedure Laterality Date  . Cholecystectomy    . Abdominal hysterectomy     Family History  Problem Relation Age of Onset  . Ulcers Mother   . Sleep apnea Mother   . Migraines Mother   . Cancer Mother   . Arthritis Mother   . Hypertension Mother   . Heart disease Mother   . Arrhythmia Son    History  Substance Use Topics  . Smoking status: Current Every Day Smoker -- 1.00 packs/day for 30 years    Types: Cigarettes  . Smokeless tobacco: Never Used  . Alcohol Use: No   OB History    No data available     Review of Systems  Constitutional: Negative for fever.  Musculoskeletal: Positive for arthralgias (Left foot).       Allergies  Moxifloxacin; Iohexol; and Shellfish allergy  Home Medications   Prior to Admission medications   Medication Sig Start Date End Date Taking? Authorizing Provider  albuterol (PROVENTIL HFA;VENTOLIN HFA) 108 (90 BASE) MCG/ACT inhaler Inhale 1-2 puffs into the lungs every 6 (six) hours as needed for wheezing or shortness of breath. 06/15/14   Margaret Christen, MD  albuterol (PROVENTIL) (2.5 MG/3ML) 0.083% nebulizer solution Take 3 mLs (2.5 mg total) by nebulization every 4 (four) hours as needed for wheezing or shortness of breath. 06/15/14   Margaret Christen, MD  ALPRAZolam Duanne Moron) 1 MG tablet Take 1 mg by mouth 4 (four) times daily as needed.    Historical Provider, MD  aspirin 325 MG tablet Take 325 mg by mouth as needed.     Historical Provider, MD  baclofen (LIORESAL) 10 MG tablet Take 10 mg by mouth 4 (four) times daily as needed.    Historical Provider, MD  beclomethasone (QVAR) 40 MCG/ACT inhaler Inhale 1 puff into the lungs 2 (two) times daily.    Historical Provider, MD  cyclobenzaprine (FLEXERIL) 10 MG tablet Take 10 mg by mouth 3 (three) times daily as needed.    Historical Provider, MD  EST ESTROGENS-METHYLTEST HS 0.625-1.25 MG per tablet TAKE 1 TABLET BY MOUTH EVERY DAY 01/24/14   Florian Buff, MD  fenofibrate (TRICOR) 145 MG tablet Take 145 mg by mouth daily.    Historical Provider, MD  fish oil-omega-3 fatty acids 1000 MG capsule Take 2 g by mouth 2 (two) times daily.    Historical Provider, MD  mirtazapine (REMERON) 30 MG tablet Take 30 mg by mouth 3 (three) times daily as needed.    Historical Provider, MD  oxyCODONE-acetaminophen (PERCOCET) 5-325 MG per tablet Take 1-2 tablets by mouth every 4 (four) hours as needed. 06/15/14   Margaret Christen, MD  oxymorphone (OPANA ER) 40 MG 12 hr tablet Take 40 mg by mouth every 12 (twelve) hours.    Historical Provider, MD  predniSONE (DELTASONE) 50 MG tablet One tab daily for 7 days 06/15/14   Margaret Christen, MD  pregabalin (LYRICA) 200 MG  capsule Take 200 mg by mouth daily.     Historical Provider, MD  ramipril (ALTACE) 5 MG capsule Take 5 mg by mouth daily.    Historical Provider, MD  simvastatin (ZOCOR) 40 MG tablet Take 40 mg by mouth daily.    Historical Provider, MD  venlafaxine XR (EFFEXOR-XR) 75 MG 24 hr capsule Take 75 mg by mouth daily with breakfast.    Historical Provider, MD   Pulse 103  Temp(Src) 98.9 F (37.2 C) (Oral)  Resp 20  Ht 5\' 4"  (1.626 m)  Wt 172 lb (78.019 kg)  BMI 29.51 kg/m2  SpO2 90% Physical Exam  Constitutional: She is oriented to person, place, and time. She appears well-developed and well-nourished.  HENT:  Head: Normocephalic and atraumatic.  Eyes: Conjunctivae and EOM are normal. Pupils are equal, round, and reactive to light.  Neck: Normal range of motion. Neck supple.  Cardiovascular: Normal rate, regular rhythm and normal heart sounds.   Pulmonary/Chest: Effort normal and breath sounds normal.  Lungs are clear  Abdominal: Soft. Bowel sounds are normal.  Musculoskeletal: Normal range of motion.  Tender on her left medial and lateral calcaneous.  No posterior thigh or calf tenderness on the left  Neurological: She is alert and oriented to person, place, and time.  Skin: Skin is warm and dry.  Psychiatric: She has a normal mood and affect. Her behavior is normal.  Nursing note and vitals reviewed.   ED Course  Procedures (including critical care time) DIAGNOSTIC STUDIES: Oxygen Saturation is 95% on RA, adequate by my interpretation.    COORDINATION OF CARE: 5:02 PM- Pt advised of plan for treatment and pt agrees.    Labs Review Labs Reviewed - No data to display  Imaging Review Dg Chest 2 View  06/15/2014   CLINICAL DATA:  Wheezing.  Left foot pain.  EXAM: CHEST  2 VIEW  COMPARISON:  12/15/2013  FINDINGS: The heart size and mediastinal contours are within normal limits. Both lungs are clear. The visualized skeletal structures are unremarkable.  IMPRESSION: No active  cardiopulmonary disease.   Electronically Signed   By: Sherryl Barters M.D.   On: 06/15/2014 17:59   Dg Foot Complete Left  06/15/2014   CLINICAL DATA:  Lateral ankle and heel pain.  No acute injury.  EXAM: LEFT FOOT - COMPLETE 3+ VIEW  COMPARISON:  None.  FINDINGS: The mineralization and alignment are normal. There is no evidence of acute fracture or dislocation. The joint spaces are maintained. There is no focal soft tissue swelling or evidence of foreign body.  IMPRESSION: Negative left  foot radiographs.   Electronically Signed   By: Camie Patience M.D.   On: 06/15/2014 18:01     EKG Interpretation None      MDM   Final diagnoses:  Wheezing  Left foot pain  COPD with acute exacerbation    Chest x-ray shows no active pathology. Left foot shows no fracture. Patient feels better after a breathing treatment of albuterol and Atrovent. Discharge medications Percocet, prednisone 50 mg, albuterol inhaler, albuterol nebulizer solution. ASO for foot and ankle support.  I personally performed the services described in this documentation, which was scribed in my presence. The recorded information has been reviewed and is accurate.    Margaret Christen, MD 06/15/14 2000

## 2014-08-07 ENCOUNTER — Other Ambulatory Visit: Payer: Self-pay | Admitting: Obstetrics & Gynecology

## 2014-12-04 ENCOUNTER — Other Ambulatory Visit (HOSPITAL_COMMUNITY): Payer: Self-pay | Admitting: Unknown Physician Specialty

## 2014-12-04 DIAGNOSIS — R208 Other disturbances of skin sensation: Secondary | ICD-10-CM

## 2014-12-12 ENCOUNTER — Ambulatory Visit (HOSPITAL_COMMUNITY): Payer: Medicaid Other

## 2015-01-14 ENCOUNTER — Other Ambulatory Visit: Payer: Self-pay | Admitting: Obstetrics & Gynecology

## 2015-08-14 ENCOUNTER — Telehealth: Payer: Self-pay | Admitting: Obstetrics & Gynecology

## 2015-08-14 NOTE — Telephone Encounter (Signed)
Pt c/o shortness of breathe, feels like she is having trouble with her lungs,congestion,  fell at home and bruised left breast. Pt states checked her O2 and was in 70's and 80's. Pt states taking Mucinex everyday since Thanksgiving Day. Pt advised to go to ER for evaluation. Pt has an appt here with Dr.Eure on 08/22/2015 for pap and physical.

## 2015-08-21 ENCOUNTER — Encounter (HOSPITAL_COMMUNITY): Payer: Self-pay

## 2015-08-21 ENCOUNTER — Emergency Department (HOSPITAL_COMMUNITY)
Admission: EM | Admit: 2015-08-21 | Discharge: 2015-08-22 | Disposition: A | Discharge status: Home / Self Care | Payer: Medicaid Other | Attending: Emergency Medicine | Admitting: Emergency Medicine

## 2015-08-21 DIAGNOSIS — Z872 Personal history of diseases of the skin and subcutaneous tissue: Secondary | ICD-10-CM | POA: Diagnosis not present

## 2015-08-21 DIAGNOSIS — X58XXXA Exposure to other specified factors, initial encounter: Secondary | ICD-10-CM | POA: Insufficient documentation

## 2015-08-21 DIAGNOSIS — F418 Other specified anxiety disorders: Secondary | ICD-10-CM | POA: Insufficient documentation

## 2015-08-21 DIAGNOSIS — Z86718 Personal history of other venous thrombosis and embolism: Secondary | ICD-10-CM | POA: Insufficient documentation

## 2015-08-21 DIAGNOSIS — E119 Type 2 diabetes mellitus without complications: Secondary | ICD-10-CM | POA: Diagnosis not present

## 2015-08-21 DIAGNOSIS — G4700 Insomnia, unspecified: Secondary | ICD-10-CM | POA: Diagnosis not present

## 2015-08-21 DIAGNOSIS — Y9289 Other specified places as the place of occurrence of the external cause: Secondary | ICD-10-CM | POA: Diagnosis not present

## 2015-08-21 DIAGNOSIS — Z7952 Long term (current) use of systemic steroids: Secondary | ICD-10-CM | POA: Insufficient documentation

## 2015-08-21 DIAGNOSIS — Z79899 Other long term (current) drug therapy: Secondary | ICD-10-CM | POA: Insufficient documentation

## 2015-08-21 DIAGNOSIS — Y9389 Activity, other specified: Secondary | ICD-10-CM | POA: Diagnosis not present

## 2015-08-21 DIAGNOSIS — E785 Hyperlipidemia, unspecified: Secondary | ICD-10-CM | POA: Insufficient documentation

## 2015-08-21 DIAGNOSIS — I10 Essential (primary) hypertension: Secondary | ICD-10-CM | POA: Insufficient documentation

## 2015-08-21 DIAGNOSIS — Y998 Other external cause status: Secondary | ICD-10-CM | POA: Insufficient documentation

## 2015-08-21 DIAGNOSIS — J45909 Unspecified asthma, uncomplicated: Secondary | ICD-10-CM | POA: Insufficient documentation

## 2015-08-21 DIAGNOSIS — Z7982 Long term (current) use of aspirin: Secondary | ICD-10-CM | POA: Insufficient documentation

## 2015-08-21 DIAGNOSIS — Z8739 Personal history of other diseases of the musculoskeletal system and connective tissue: Secondary | ICD-10-CM | POA: Diagnosis not present

## 2015-08-21 DIAGNOSIS — S0501XA Injury of conjunctiva and corneal abrasion without foreign body, right eye, initial encounter: Secondary | ICD-10-CM

## 2015-08-21 DIAGNOSIS — G894 Chronic pain syndrome: Secondary | ICD-10-CM | POA: Insufficient documentation

## 2015-08-21 DIAGNOSIS — Z23 Encounter for immunization: Secondary | ICD-10-CM | POA: Diagnosis not present

## 2015-08-21 DIAGNOSIS — S0591XA Unspecified injury of right eye and orbit, initial encounter: Secondary | ICD-10-CM | POA: Diagnosis present

## 2015-08-21 DIAGNOSIS — J3489 Other specified disorders of nose and nasal sinuses: Secondary | ICD-10-CM | POA: Insufficient documentation

## 2015-08-21 DIAGNOSIS — F17210 Nicotine dependence, cigarettes, uncomplicated: Secondary | ICD-10-CM | POA: Diagnosis not present

## 2015-08-21 NOTE — ED Notes (Signed)
Pt states she poked herself in the right eye with a toothpick Tuesday night.  Pt states she has put neosporin and kept an eye patch over it.

## 2015-08-22 ENCOUNTER — Other Ambulatory Visit: Payer: Medicaid Other | Admitting: Obstetrics & Gynecology

## 2015-08-22 MED ORDER — TETANUS-DIPHTH-ACELL PERTUSSIS 5-2.5-18.5 LF-MCG/0.5 IM SUSP
0.5000 mL | Freq: Once | INTRAMUSCULAR | Status: AC
  Administered 2015-08-22: 0.5 mL via INTRAMUSCULAR
  Filled 2015-08-22: qty 0.5

## 2015-08-22 MED ORDER — KETOROLAC TROMETHAMINE 0.5 % OP SOLN
1.0000 [drp] | Freq: Once | OPHTHALMIC | Status: AC
  Administered 2015-08-22: 1 [drp] via OPHTHALMIC
  Filled 2015-08-22: qty 5

## 2015-08-22 MED ORDER — FLUORESCEIN SODIUM 1 MG OP STRP
ORAL_STRIP | OPHTHALMIC | Status: AC
  Administered 2015-08-22: 01:00:00
  Filled 2015-08-22: qty 1

## 2015-08-22 MED ORDER — ERYTHROMYCIN 5 MG/GM OP OINT
TOPICAL_OINTMENT | Freq: Once | OPHTHALMIC | Status: AC
  Administered 2015-08-22: 1 via OPHTHALMIC
  Filled 2015-08-22: qty 3.5

## 2015-08-22 MED ORDER — TETRACAINE HCL 0.5 % OP SOLN
OPHTHALMIC | Status: AC
  Administered 2015-08-22: 01:00:00
  Filled 2015-08-22: qty 4

## 2015-08-22 NOTE — ED Provider Notes (Signed)
CSN: [facility]:[id number]     Arrival date & time 08/21/15  2320 History   First MD Initiated Contact with Patient 08/21/15 2356     Chief Complaint  Patient presents with  . Eye Injury     (Consider location/radiation/quality/duration/timing/severity/associated sxs/prior Treatment) The history is provided by the patient.   Margaret Vaughn is a 55 y.o. female with past medical history indicated below presenting with right eye pain, photophobia, redness and foreign body sensation since yesterday when she accidentally poked her eye with a toothpick.  She does not wear contacts, is supposed to wear reading glasses but rarely does.  She does have decreased vision in the affected eye, reporting blurring due to continued tearing and drainage.  She has applied neosporin with lidocaine last night and also took her chronic pain medicine to help her sleep last night.  She is unsure of her tetanus status.     Past Medical History  Diagnosis Date  . Ulcer disease   . Sleeping difficulty   . Asthma   . HTN (hypertension)   . Headache(784.0)   . Bronchitis   . Hypoxemia   . Myalgia and myositis   . Depression with anxiety   . OSA (obstructive sleep apnea)   . Chronic pain syndrome   . Fatigue   . Insomnia   . Narcolepsy   . Memory loss   . Hyperlipidemia   . Tremor   . Diabetes mellitus without complication ([place])   . H/O blood clots    Past Surgical History  Procedure Laterality Date  . Cholecystectomy    . Abdominal hysterectomy     Family History  Problem Relation Age of Onset  . Ulcers Mother   . Sleep apnea Mother   . Migraines Mother   . Cancer Mother   . Arthritis Mother   . Hypertension Mother   . Heart disease Mother   . Arrhythmia Son    Social History  Substance Use Topics  . Smoking status: Current Every Day Smoker -- 1.00 packs/day for 30 years    Types: Cigarettes  . Smokeless tobacco: Never Used  . Alcohol Use: No   OB History    No data available     Review  of Systems  Constitutional: Negative for fever.  HENT: Negative.  Negative for congestion and sore throat.   Eyes: Positive for photophobia, pain, redness and visual disturbance.  Respiratory: Negative for chest tightness and shortness of breath.   Cardiovascular: Negative for chest pain.  Gastrointestinal: Negative for nausea.  Genitourinary: Negative.   Musculoskeletal: Negative.   Skin: Negative.  Negative for rash and wound.  Neurological: Negative for dizziness, weakness, light-headedness, numbness and headaches.  Psychiatric/Behavioral: Negative.       Allergies  Moxifloxacin; Iohexol; and Shellfish allergy  Home Medications   Prior to Admission medications   Medication Sig Start Date End Date Taking? Authorizing Provider  albuterol (PROVENTIL HFA;VENTOLIN HFA) 108 (90 BASE) MCG/ACT inhaler Inhale 1-2 puffs into the lungs every 6 (six) hours as needed for wheezing or shortness of breath. 06/15/14   Nat Christen, MD  albuterol (PROVENTIL) (2.5 MG/3ML) 0.083% nebulizer solution Take 3 mLs (2.5 mg total) by nebulization every 4 (four) hours as needed for wheezing or shortness of breath. 06/15/14   Nat Christen, MD  ALPRAZolam Duanne Moron) 1 MG tablet Take 1 mg by mouth 4 (four) times daily as needed.    Historical Provider, MD  aspirin 325 MG tablet Take 325 mg by mouth as  needed.     Historical Provider, MD  baclofen (LIORESAL) 10 MG tablet Take 10 mg by mouth 4 (four) times daily as needed.    Historical Provider, MD  beclomethasone (QVAR) 40 MCG/ACT inhaler Inhale 1 puff into the lungs 2 (two) times daily.    Historical Provider, MD  cyclobenzaprine (FLEXERIL) 10 MG tablet Take 10 mg by mouth 3 (three) times daily as needed.    Historical Provider, MD  EEMT HS 0.625-1.25 MG per tablet TAKE 1 TABLET BY MOUTH EVERY DAY 01/15/15   Florian Buff, MD  fenofibrate (TRICOR) 145 MG tablet Take 145 mg by mouth daily.    Historical Provider, MD  fish oil-omega-3 fatty acids 1000 MG capsule Take 2 g  by mouth 2 (two) times daily.    Historical Provider, MD  mirtazapine (REMERON) 30 MG tablet Take 30 mg by mouth 3 (three) times daily as needed.    Historical Provider, MD  oxyCODONE-acetaminophen (PERCOCET) 5-325 MG per tablet Take 1-2 tablets by mouth every 4 (four) hours as needed. 06/15/14   Nat Christen, MD  oxymorphone (OPANA ER) 40 MG 12 hr tablet Take 40 mg by mouth every 12 (twelve) hours.    Historical Provider, MD  predniSONE (DELTASONE) 50 MG tablet One tab daily for 7 days 06/15/14   Nat Christen, MD  pregabalin (LYRICA) 200 MG capsule Take 200 mg by mouth daily.     Historical Provider, MD  ramipril (ALTACE) 5 MG capsule Take 5 mg by mouth daily.    Historical Provider, MD  simvastatin (ZOCOR) 40 MG tablet Take 40 mg by mouth daily.    Historical Provider, MD  venlafaxine XR (EFFEXOR-XR) 75 MG 24 hr capsule Take 75 mg by mouth daily with breakfast.    Historical Provider, MD   BP 140/96 mmHg  Pulse 112  Temp(Src) 98.3 F (36.8 C) (Oral)  Resp 20  Ht 5\' 4"  (1.626 m)  Wt 80.74 kg  BMI 30.54 kg/m2  SpO2 97% Physical Exam  Constitutional: She is oriented to person, place, and time. She appears well-developed and well-nourished.  HENT:  Head: Normocephalic and atraumatic.  Right Ear: Ear canal normal.  Left Ear: Ear canal normal.  Nose: Rhinorrhea present.  Mouth/Throat: Uvula is midline, oropharynx is clear and moist and mucous membranes are normal. No oropharyngeal exudate, posterior oropharyngeal edema, posterior oropharyngeal erythema or tonsillar abscesses.  Eyes: EOM and lids are normal. Pupils are equal, round, and reactive to light. Lids are everted and swept, no foreign bodies found. No foreign body present in the right eye. Right conjunctiva is injected.  Slit lamp exam:      The right eye shows corneal abrasion and fluorescein uptake. The right eye shows no corneal ulcer, no foreign body and no hyphema.  Small c shaped corneal abrasion with dye uptake at the 9 oclock  position.  No fb, no puncture or communication with the anterior chamber, no leaking of fluorescein through the abrasion.  Cardiovascular: Normal rate and normal heart sounds.   Pulmonary/Chest: Effort normal. No respiratory distress. She has no wheezes. She has no rales.  Abdominal: Soft. There is no tenderness.  Musculoskeletal: Normal range of motion.  Neurological: She is alert and oriented to person, place, and time.  Skin: Skin is warm and dry. No rash noted.  Psychiatric: She has a normal mood and affect.    ED Course  Procedures (including critical care time) Labs Review Labs Reviewed - No data to display  Imaging Review No  results found. I have personally reviewed and evaluated these images and lab results as part of my medical decision-making.   EKG Interpretation None      MDM   Final diagnoses:  Corneal abrasion, right, initial encounter    Tetanus updated, pt given erythromycin ophthal ointment, ketorolac gtts.  Referral to Dr Iona Hansen for a recheck of her injury within 48 hours, unless greatly improved by then.  Also advised f/u here for any worsened sx.     Evalee Jefferson, PA-C 08/22/15 1654  Rolland Porter, MD 08/25/15 2300

## 2015-08-22 NOTE — Discharge Instructions (Signed)
Corneal Abrasion The cornea is the clear covering at the front and center of the eye. When looking at the colored portion of the eye (iris), you are looking through the cornea. This very thin tissue is made up of many layers. The surface layer is a single layer of cells (corneal epithelium) and is one of the most sensitive tissues in the body. If a scratch or injury causes the corneal epithelium to come off, it is called a corneal abrasion. If the injury extends to the tissues below the epithelium, the condition is called a corneal ulcer. CAUSES   Scratches.  Trauma.  Foreign body in the eye. Some people have recurrences of abrasions in the area of the original injury even after it has healed (recurrent erosion syndrome). Recurrent erosion syndrome generally improves and goes away with time. SYMPTOMS   Eye pain.  Difficulty or inability to keep the injured eye open.  The eye becomes very sensitive to light.  Recurrent erosions tend to happen suddenly, first thing in the morning, usually after waking up and opening the eye. DIAGNOSIS  Your health care provider can diagnose a corneal abrasion during an eye exam. Dye is usually placed in the eye using a drop or a small paper strip moistened by your tears. When the eye is examined with a special light, the abrasion shows up clearly because of the dye. TREATMENT   Small abrasions may be treated with antibiotic drops or ointment alone.  A pressure patch may be put over the eye. If this is done, follow your doctor's instructions for when to remove the patch. Do not drive or use machines while the eye patch is on. Judging distances is hard to do with a patch on. If the abrasion becomes infected and spreads to the deeper tissues of the cornea, a corneal ulcer can result. This is serious because it can cause corneal scarring. Corneal scars interfere with light passing through the cornea and cause a loss of vision in the involved eye. HOME CARE  INSTRUCTIONS  Use medicine or ointment as directed. Only take over-the-counter or prescription medicines for pain, discomfort, or fever as directed by your health care provider.  Do not drive or operate machinery if your eye is patched. Your ability to judge distances is impaired.  If your health care provider has given you a follow-up appointment, it is very important to keep that appointment. Not keeping the appointment could result in a severe eye infection or permanent loss of vision. If there is any problem keeping the appointment, let your health care provider know. SEEK MEDICAL CARE IF:   You have pain, light sensitivity, and a scratchy feeling in one eye or both eyes.  Your pressure patch keeps loosening up, and you can blink your eye under the patch after treatment.  Any kind of discharge develops from the eye after treatment or if the lids stick together in the morning.  You have the same symptoms in the morning as you did with the original abrasion days, weeks, or months after the abrasion healed.   This information is not intended to replace advice given to you by your health care provider. Make sure you discuss any questions you have with your health care provider.   Document Released: 07/10/2000 Document Revised: 04/03/2015 Document Reviewed: 03/20/2013 Elsevier Interactive Patient Education 2016 [place] one drop of the ketorolac (anti inflammatory) drop in your right eye every 4 hours, then apply a small ribbon of the erythromycin  ointment.

## 2016-11-21 DIAGNOSIS — J45909 Unspecified asthma, uncomplicated: Secondary | ICD-10-CM | POA: Diagnosis not present

## 2016-11-22 ENCOUNTER — Emergency Department (HOSPITAL_COMMUNITY): Payer: Medicaid Other

## 2016-11-22 ENCOUNTER — Emergency Department (HOSPITAL_COMMUNITY)
Admission: EM | Admit: 2016-11-22 | Discharge: 2016-11-23 | Disposition: A | Discharge status: Home / Self Care | Payer: Medicaid Other | Attending: Emergency Medicine | Admitting: Emergency Medicine

## 2016-11-22 ENCOUNTER — Encounter (HOSPITAL_COMMUNITY): Payer: Self-pay | Admitting: Emergency Medicine

## 2016-11-22 DIAGNOSIS — F17210 Nicotine dependence, cigarettes, uncomplicated: Secondary | ICD-10-CM | POA: Diagnosis not present

## 2016-11-22 DIAGNOSIS — R0602 Shortness of breath: Secondary | ICD-10-CM | POA: Diagnosis present

## 2016-11-22 DIAGNOSIS — Z79899 Other long term (current) drug therapy: Secondary | ICD-10-CM | POA: Diagnosis not present

## 2016-11-22 DIAGNOSIS — J441 Chronic obstructive pulmonary disease with (acute) exacerbation: Secondary | ICD-10-CM | POA: Diagnosis not present

## 2016-11-22 DIAGNOSIS — E119 Type 2 diabetes mellitus without complications: Secondary | ICD-10-CM | POA: Insufficient documentation

## 2016-11-22 LAB — CBC
HCT: 46.5 % — ABNORMAL HIGH (ref 36.0–46.0)
Hemoglobin: 15.4 g/dL — ABNORMAL HIGH (ref 12.0–15.0)
MCH: 31.4 pg (ref 26.0–34.0)
MCHC: 33.1 g/dL (ref 30.0–36.0)
MCV: 94.7 fL (ref 78.0–100.0)
Platelets: 311 10*3/uL (ref 150–400)
RBC: 4.91 MIL/uL (ref 3.87–5.11)
RDW: 14.9 % (ref 11.5–15.5)
WBC: 10.7 10*3/uL — ABNORMAL HIGH (ref 4.0–10.5)

## 2016-11-22 LAB — BASIC METABOLIC PANEL
Anion gap: 11 (ref 5–15)
BUN: 5 mg/dL — ABNORMAL LOW (ref 6–20)
CO2: 32 mmol/L (ref 22–32)
Calcium: 9.7 mg/dL (ref 8.9–10.3)
Chloride: 93 mmol/L — ABNORMAL LOW (ref 101–111)
Creatinine, Ser: 0.69 mg/dL (ref 0.44–1.00)
GFR calc Af Amer: >60 mL/min (ref >60)
GFR calc non Af Amer: >60 mL/min (ref >60)
Glucose, Bld: 112 mg/dL — ABNORMAL HIGH (ref 65–99)
Potassium: 3.6 mmol/L (ref 3.5–5.1)
Sodium: 136 mmol/L (ref 135–145)

## 2016-11-22 MED ORDER — ALBUTEROL (5 MG/ML) CONTINUOUS INHALATION SOLN
15.0000 mg/h | INHALATION_SOLUTION | RESPIRATORY_TRACT | Status: AC
  Administered 2016-11-22: 15 mg/h via RESPIRATORY_TRACT
  Filled 2016-11-22: qty 20

## 2016-11-22 MED ORDER — ALBUTEROL SULFATE (2.5 MG/3ML) 0.083% IN NEBU
5.0000 mg | INHALATION_SOLUTION | Freq: Once | RESPIRATORY_TRACT | Status: DC
  Filled 2016-11-22: qty 6

## 2016-11-22 MED ORDER — ALBUTEROL SULFATE HFA 108 (90 BASE) MCG/ACT IN AERS
INHALATION_SPRAY | RESPIRATORY_TRACT | Status: AC
  Filled 2016-11-22: qty 6.7

## 2016-11-22 MED ORDER — DEXAMETHASONE SODIUM PHOSPHATE 10 MG/ML IJ SOLN
10.0000 mg | Freq: Once | INTRAMUSCULAR | Status: AC
  Administered 2016-11-22: 10 mg via INTRAVENOUS
  Filled 2016-11-22: qty 1

## 2016-11-22 MED ORDER — IPRATROPIUM BROMIDE 0.02 % IN SOLN
0.5000 mg | RESPIRATORY_TRACT | Status: AC
  Administered 2016-11-22: 0.5 mg via RESPIRATORY_TRACT
  Filled 2016-11-22: qty 2.5

## 2016-11-22 MED ORDER — ALBUTEROL SULFATE (2.5 MG/3ML) 0.083% IN NEBU
INHALATION_SOLUTION | RESPIRATORY_TRACT | Status: AC
  Filled 2016-11-22: qty 3

## 2016-11-22 NOTE — ED Triage Notes (Signed)
Pt presents with severe SHOB, audible rhonchi, pt states she has been out of nebs for several weeks, speaking in short sentences. +cough.

## 2016-11-23 MED ORDER — ALBUTEROL SULFATE (2.5 MG/3ML) 0.083% IN NEBU: 2.5000 mg | INHALATION_SOLUTION | RESPIRATORY_TRACT | 2 refills | Status: DC | PRN

## 2016-11-23 MED ORDER — BECLOMETHASONE DIPROPIONATE 40 MCG/ACT IN AERS: 1.0000 | INHALATION_SPRAY | Freq: Two times a day (BID) | RESPIRATORY_TRACT | 0 refills | Status: DC

## 2016-11-23 MED ORDER — ALBUTEROL SULFATE HFA 108 (90 BASE) MCG/ACT IN AERS: 1.0000 | INHALATION_SPRAY | Freq: Four times a day (QID) | RESPIRATORY_TRACT | 2 refills | Status: DC | PRN

## 2016-11-23 MED ORDER — PREDNISONE 50 MG PO TABS: 50.0000 mg | ORAL_TABLET | Freq: Every day | ORAL | 0 refills | Status: DC

## 2016-11-23 NOTE — ED Provider Notes (Signed)
[facility] DEPT Provider Note   CSN: [id number] Arrival date & time: 11/22/16  1922     History   Chief Complaint Chief Complaint  Patient presents with  . Shortness of Breath    HPI Margaret Vaughn is a 56 y.o. female.  HPI Patient presents to the emergency department with increasing shortness of breath and wheezing.  The patient states she ran out of her medications and does not have them over the last 2 weeks.  She states that her symptoms and got worse over that time frame.  Patient states that she feels like she does not need admission to the hospital.  I feel that she does need her medications. The patient denies chest pain, shortness of breath, headache,blurred vision, neck pain, fever, cough, weakness, numbness, dizziness, anorexia, edema, abdominal pain, nausea, vomiting, diarrhea, rash, back pain, dysuria, hematemesis, bloody stool, near syncope, or syncope. Past Medical History:  Diagnosis Date  . Asthma   . Bronchitis   . Chronic pain syndrome   . Depression with anxiety   . Diabetes mellitus without complication ([place])   . Fatigue   . H/O blood clots   . Headache(784.0)   . HTN (hypertension)   . Hyperlipidemia   . Hypoxemia   . Insomnia   . Memory loss   . Myalgia and myositis   . Narcolepsy   . OSA (obstructive sleep apnea)   . Sleeping difficulty   . Tremor   . Ulcer disease     Patient Active Problem List   Diagnosis Date Noted  . GAD (generalized anxiety disorder) 01/30/2014  . Essential hypertension, benign 01/30/2014  . Dyspnea 08/10/2012  . Chest pain at rest 08/10/2012  . Hyperlipidemia 08/10/2012  . Tobacco abuse 08/10/2012  . GERD 12/24/2009  . DYSPHAGIA 12/24/2009  . OTHER SYMPTOMS INVOLVING DIGESTIVE SYSTEM OTHER 12/24/2009    Past Surgical History:  Procedure Laterality Date  . ABDOMINAL HYSTERECTOMY    . CHOLECYSTECTOMY      OB History    No data available       Home Medications    Prior to Admission medications     Medication Sig Start Date End Date Taking? Authorizing Provider  albuterol (PROVENTIL HFA;VENTOLIN HFA) 108 (90 BASE) MCG/ACT inhaler Inhale 1-2 puffs into the lungs every 6 (six) hours as needed for wheezing or shortness of breath. 06/15/14  Yes Nat Christen, MD  albuterol (PROVENTIL) (2.5 MG/3ML) 0.083% nebulizer solution Take 3 mLs (2.5 mg total) by nebulization every 4 (four) hours as needed for wheezing or shortness of breath. 06/15/14  Yes Nat Christen, MD  cyclobenzaprine (FLEXERIL) 10 MG tablet Take 10 mg by mouth 3 (three) times daily as needed for muscle spasms.  12/12/15  Yes Historical Provider, MD  eletriptan (RELPAX) 40 MG tablet Take 40 mg by mouth once as needed for migraine (MAY REPEAT ONCE IN 2 HOURS IF NO RELIEF).  01/30/15  Yes Historical Provider, MD  fish oil-omega-3 fatty acids 1000 MG capsule Take 2 g by mouth 2 (two) times daily.   Yes Historical Provider, MD  guaiFENesin (MUCINEX) 600 MG 12 hr tablet Take 600 mg by mouth 2 (two) times daily.   Yes Historical Provider, MD  oxyCODONE (OXYCONTIN) 40 mg 12 hr tablet Take 40 mg by mouth 2 (two) times daily as needed for pain. 10/08/16  Yes Historical Provider, MD  oxyCODONE-acetaminophen (PERCOCET) 10-325 MG tablet Take 1 tablet by mouth every 6 (six) hours as needed for pain. 10/08/16  Yes Historical  Provider, MD  pregabalin (LYRICA) 200 MG capsule Take 200 mg by mouth at bedtime.    Yes Historical Provider, MD  ramipril (ALTACE) 5 MG capsule Take 5 mg by mouth daily.   Yes Historical Provider, MD  simvastatin (ZOCOR) 40 MG tablet Take 40 mg by mouth daily.   Yes Historical Provider, MD  Suvorexant (BELSOMRA) 20 MG TABS Take 20 mg by mouth at bedtime as needed (for sleep).  08/19/16  Yes Historical Provider, MD  venlafaxine XR (EFFEXOR-XR) 150 MG 24 hr capsule Take 150 mg by mouth at bedtime. 08/30/16  Yes Historical Provider, MD  beclomethasone (QVAR) 40 MCG/ACT inhaler Inhale 1 puff into the lungs 2 (two) times daily.    Historical  Provider, MD  EEMT HS 0.625-1.25 MG per tablet TAKE 1 TABLET BY MOUTH EVERY DAY Patient not taking: Reported on 11/22/2016 01/15/15   Florian Buff, MD  oxyCODONE-acetaminophen (PERCOCET) 5-325 MG per tablet Take 1-2 tablets by mouth every 4 (four) hours as needed. Patient not taking: Reported on 11/22/2016 06/15/14   Nat Christen, MD  predniSONE (DELTASONE) 50 MG tablet One tab daily for 7 days Patient not taking: Reported on 11/22/2016 06/15/14   Nat Christen, MD    Family History Family History  Problem Relation Age of Onset  . Ulcers Mother   . Sleep apnea Mother   . Migraines Mother   . Cancer Mother   . Arthritis Mother   . Hypertension Mother   . Heart disease Mother   . Arrhythmia Son     Social History Social History  Substance Use Topics  . Smoking status: Current Every Day Smoker    Packs/day: 1.00    Years: 30.00    Types: Cigarettes  . Smokeless tobacco: Never Used  . Alcohol use No     Allergies   Iohexol; Moxifloxacin; Pregabalin; and Shellfish allergy   Review of Systems Review of Systems All other systems negative except as documented in the HPI. All pertinent positives and negatives as reviewed in the HPI.  Physical Exam Updated Vital Signs BP 133/77   Pulse 100   Temp 98.4 F (36.9 C) (Oral)   Resp 15   Ht 5\' 4"  (1.626 m)   Wt 80.7 kg   SpO2 95%   BMI 30.55 kg/m   Physical Exam  Constitutional: She is oriented to person, place, and time. She appears well-developed and well-nourished. No distress.  HENT:  Head: Normocephalic and atraumatic.  Mouth/Throat: Oropharynx is clear and moist.  Eyes: Pupils are equal, round, and reactive to light.  Neck: Normal range of motion. Neck supple.  Cardiovascular: Normal rate, regular rhythm and normal heart sounds.  Exam reveals no gallop and no friction rub.   No murmur heard. Pulmonary/Chest: Effort normal. She has wheezes.  Neurological: She is alert and oriented to person, place, and time. She exhibits  normal muscle tone. Coordination normal.  Skin: Skin is warm and dry. Capillary refill takes less than 2 seconds. No rash noted. No erythema.  Psychiatric: She has a normal mood and affect. Her behavior is normal.  Nursing note and vitals reviewed.    ED Treatments / Results  Labs (all labs ordered are listed, but only abnormal results are displayed) Labs Reviewed  BASIC METABOLIC PANEL - Abnormal; Notable for the following:       Result Value   Chloride 93 (*)    Glucose, Bld 112 (*)    BUN 5 (*)    All other components within  normal limits  CBC - Abnormal; Notable for the following:    WBC 10.7 (*)    Hemoglobin 15.4 (*)    HCT 46.5 (*)    All other components within normal limits    EKG  EKG Interpretation None       Radiology Dg Chest 2 View  Result Date: 11/22/2016 CLINICAL DATA:  COPD with worsening shortness of breath EXAM: CHEST  2 VIEW COMPARISON:  06/15/2014 FINDINGS: Mild hyperinflation. No focal infiltrate or effusion. Normal cardiomediastinal silhouette. No pneumothorax. IMPRESSION: No active cardiopulmonary disease. Electronically Signed   By: Donavan Foil M.D.   On: 11/22/2016 22:28    Procedures Procedures (including critical care time)  Medications Ordered in ED Medications  albuterol (PROVENTIL) (2.5 MG/3ML) 0.083% nebulizer solution 5 mg (not administered)  albuterol (PROVENTIL,VENTOLIN) solution continuous neb (15 mg/hr Nebulization New Bag/Given 11/22/16 2153)  ipratropium (ATROVENT) nebulizer solution 0.5 mg (0.5 mg Nebulization Given 11/22/16 2146)  dexamethasone (DECADRON) injection 10 mg (10 mg Intravenous Given 11/22/16 2152)     Initial Impression / Assessment and Plan / ED Course  I have reviewed the triage vital signs and the nursing notes.  Pertinent labs & imaging results that were available during my care of the patient were reviewed by me and considered in my medical decision making (see chart for details).     Patient will be  discharged home with her medications.  Patient is advised to follow up with her primary care Dr. told to return here as needed.  Patient agrees the plan and all questions were answered.  Patient is vastly improved following an hour-long neb treatment and steroids  Final Clinical Impressions(s) / ED Diagnoses   Final diagnoses:  None    New Prescriptions New Prescriptions   No medications on file     Dalia Heading, PA-C 11/23/16 0026    Daleen Bo, MD 11/23/16 1510

## 2016-11-23 NOTE — Discharge Instructions (Signed)
Follow-up with your primary care doctor.  Return here as needed °

## 2017-09-23 DIAGNOSIS — M7918 Myalgia, other site: Secondary | ICD-10-CM | POA: Insufficient documentation

## 2017-10-11 ENCOUNTER — Other Ambulatory Visit (HOSPITAL_COMMUNITY): Payer: Self-pay | Admitting: Physician Assistant

## 2017-10-11 DIAGNOSIS — G894 Chronic pain syndrome: Secondary | ICD-10-CM

## 2017-10-11 DIAGNOSIS — G950 Syringomyelia and syringobulbia: Secondary | ICD-10-CM

## 2017-10-14 ENCOUNTER — Ambulatory Visit (HOSPITAL_COMMUNITY): Payer: Medicaid Other

## 2017-10-14 ENCOUNTER — Encounter (HOSPITAL_COMMUNITY): Payer: Self-pay

## 2017-10-27 ENCOUNTER — Ambulatory Visit (HOSPITAL_COMMUNITY)
Admission: RE | Admit: 2017-10-27 | Discharge: 2017-10-27 | Disposition: A | Discharge status: Home / Self Care | Payer: Medicaid Other | Source: Ambulatory Visit | Attending: Physician Assistant | Admitting: Physician Assistant

## 2017-10-27 DIAGNOSIS — M5030 Other cervical disc degeneration, unspecified cervical region: Secondary | ICD-10-CM | POA: Insufficient documentation

## 2017-10-27 DIAGNOSIS — G894 Chronic pain syndrome: Secondary | ICD-10-CM

## 2017-10-27 DIAGNOSIS — M50222 Other cervical disc displacement at C5-C6 level: Secondary | ICD-10-CM | POA: Insufficient documentation

## 2017-10-27 DIAGNOSIS — G950 Syringomyelia and syringobulbia: Secondary | ICD-10-CM | POA: Insufficient documentation

## 2017-10-27 DIAGNOSIS — M4802 Spinal stenosis, cervical region: Secondary | ICD-10-CM | POA: Insufficient documentation

## 2017-11-23 DIAGNOSIS — H2513 Age-related nuclear cataract, bilateral: Secondary | ICD-10-CM | POA: Diagnosis not present

## 2017-11-23 DIAGNOSIS — E119 Type 2 diabetes mellitus without complications: Secondary | ICD-10-CM | POA: Diagnosis not present

## 2017-11-23 DIAGNOSIS — H35101 Retinopathy of prematurity, unspecified, right eye: Secondary | ICD-10-CM | POA: Diagnosis not present

## 2018-01-03 DIAGNOSIS — H04123 Dry eye syndrome of bilateral lacrimal glands: Secondary | ICD-10-CM | POA: Diagnosis not present

## 2018-01-03 DIAGNOSIS — H2511 Age-related nuclear cataract, right eye: Secondary | ICD-10-CM | POA: Diagnosis not present

## 2018-01-24 DIAGNOSIS — R0902 Hypoxemia: Secondary | ICD-10-CM | POA: Diagnosis not present

## 2018-02-21 DIAGNOSIS — G894 Chronic pain syndrome: Secondary | ICD-10-CM | POA: Diagnosis not present

## 2018-02-21 DIAGNOSIS — M7918 Myalgia, other site: Secondary | ICD-10-CM | POA: Diagnosis not present

## 2018-02-21 DIAGNOSIS — M545 Low back pain: Secondary | ICD-10-CM | POA: Diagnosis not present

## 2018-02-21 DIAGNOSIS — G950 Syringomyelia and syringobulbia: Secondary | ICD-10-CM | POA: Diagnosis not present

## 2018-02-21 DIAGNOSIS — G8929 Other chronic pain: Secondary | ICD-10-CM | POA: Diagnosis not present

## 2018-02-24 DIAGNOSIS — R0902 Hypoxemia: Secondary | ICD-10-CM | POA: Diagnosis not present

## 2018-03-11 ENCOUNTER — Ambulatory Visit: Payer: Medicaid Other | Admitting: Family Medicine

## 2018-03-11 ENCOUNTER — Other Ambulatory Visit: Payer: Self-pay | Admitting: Family Medicine

## 2018-03-11 ENCOUNTER — Encounter: Payer: Self-pay | Admitting: Family Medicine

## 2018-03-11 VITALS — BP 138/75 | HR 109 | Temp 98.1°F | Ht 64.0 in | Wt 171.4 lb

## 2018-03-11 DIAGNOSIS — J449 Chronic obstructive pulmonary disease, unspecified: Secondary | ICD-10-CM | POA: Insufficient documentation

## 2018-03-11 DIAGNOSIS — E782 Mixed hyperlipidemia: Secondary | ICD-10-CM

## 2018-03-11 DIAGNOSIS — F411 Generalized anxiety disorder: Secondary | ICD-10-CM

## 2018-03-11 DIAGNOSIS — J410 Simple chronic bronchitis: Secondary | ICD-10-CM

## 2018-03-11 DIAGNOSIS — M5136 Other intervertebral disc degeneration, lumbar region: Secondary | ICD-10-CM

## 2018-03-11 DIAGNOSIS — E663 Overweight: Secondary | ICD-10-CM | POA: Diagnosis not present

## 2018-03-11 DIAGNOSIS — K219 Gastro-esophageal reflux disease without esophagitis: Secondary | ICD-10-CM

## 2018-03-11 DIAGNOSIS — E1169 Type 2 diabetes mellitus with other specified complication: Secondary | ICD-10-CM | POA: Diagnosis not present

## 2018-03-11 DIAGNOSIS — F4310 Post-traumatic stress disorder, unspecified: Secondary | ICD-10-CM

## 2018-03-11 DIAGNOSIS — I10 Essential (primary) hypertension: Secondary | ICD-10-CM | POA: Diagnosis not present

## 2018-03-11 DIAGNOSIS — Z720 Tobacco use: Secondary | ICD-10-CM

## 2018-03-11 LAB — CBC WITH DIFFERENTIAL/PLATELET
Basophils Absolute: 0 10*3/uL (ref 0.0–0.2)
Basos: 0 %
EOS (ABSOLUTE): 0.1 10*3/uL (ref 0.0–0.4)
Eos: 1 %
Hematocrit: 42.9 % (ref 34.0–46.6)
Hemoglobin: 14.4 g/dL (ref 11.1–15.9)
Immature Grans (Abs): 0.1 10*3/uL (ref 0.0–0.1)
Immature Granulocytes: 1 %
Lymphocytes Absolute: 2.2 10*3/uL (ref 0.7–3.1)
Lymphs: 23 %
MCH: 30.7 pg (ref 26.6–33.0)
MCHC: 33.6 g/dL (ref 31.5–35.7)
MCV: 92 fL (ref 79–97)
Monocytes Absolute: 0.7 10*3/uL (ref 0.1–0.9)
Monocytes: 7 %
Neutrophils Absolute: 6.5 10*3/uL (ref 1.4–7.0)
Neutrophils: 68 %
Platelets: 350 10*3/uL (ref 150–450)
RBC: 4.69 x10E6/uL (ref 3.77–5.28)
RDW: 14.1 % (ref 12.3–15.4)
WBC: 9.6 10*3/uL (ref 3.4–10.8)

## 2018-03-11 LAB — CMP14+EGFR
ALT: 20 IU/L (ref 0–32)
AST: 18 IU/L (ref 0–40)
Albumin/Globulin Ratio: 1.7 (ref 1.2–2.2)
Albumin: 4.2 g/dL (ref 3.5–5.5)
Alkaline Phosphatase: 89 IU/L (ref 39–117)
BUN/Creatinine Ratio: 13 (ref 9–23)
BUN: 8 mg/dL (ref 6–24)
Bilirubin Total: <0.2 mg/dL (ref 0.0–1.2)
CO2: 28 mmol/L (ref 20–29)
Calcium: 9.7 mg/dL (ref 8.7–10.2)
Chloride: 96 mmol/L (ref 96–106)
Creatinine, Ser: 0.62 mg/dL (ref 0.57–1.00)
GFR calc Af Amer: 116 mL/min/{1.73_m2} (ref >59)
GFR calc non Af Amer: 100 mL/min/{1.73_m2} (ref >59)
Globulin, Total: 2.5 g/dL (ref 1.5–4.5)
Glucose: 159 mg/dL — ABNORMAL HIGH (ref 65–99)
Potassium: 4 mmol/L (ref 3.5–5.2)
Sodium: 141 mmol/L (ref 134–144)
Total Protein: 6.7 g/dL (ref 6.0–8.5)

## 2018-03-11 LAB — LIPID PANEL
Chol/HDL Ratio: 7.7 ratio — ABNORMAL HIGH (ref 0.0–4.4)
Cholesterol, Total: 292 mg/dL — ABNORMAL HIGH (ref 100–199)
HDL: 38 mg/dL — ABNORMAL LOW (ref >39)
Triglycerides: 621 mg/dL (ref 0–149)

## 2018-03-11 LAB — BAYER DCA HB A1C WAIVED: HB A1C (BAYER DCA - WAIVED): 8.9 % — ABNORMAL HIGH (ref <7.0)

## 2018-03-11 MED ORDER — ESOMEPRAZOLE MAGNESIUM 40 MG PO CPDR: 40.0000 mg | DELAYED_RELEASE_CAPSULE | Freq: Every day | ORAL | 3 refills | Status: DC
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
## 2018-03-15 ENCOUNTER — Telehealth: Payer: Self-pay | Admitting: Family Medicine

## 2018-03-15 ENCOUNTER — Other Ambulatory Visit: Payer: Self-pay | Admitting: *Deleted

## 2018-03-15 MED ORDER — SITAGLIPTIN PHOS-METFORMIN HCL 50-1000 MG PO TABS: 1.0000 | ORAL_TABLET | Freq: Two times a day (BID) | ORAL | 1 refills | Status: DC

## 2018-03-15 MED ORDER — SIMVASTATIN 40 MG PO TABS: 40.0000 mg | ORAL_TABLET | Freq: Every day | ORAL | 1 refills | Status: DC

## 2018-03-15 NOTE — Telephone Encounter (Signed)
Patient has very elevated triglycerides and an A1c of 8.9.  The triglycerides are likely elevated because of the diabetes.  Her diabetes is very much out of control and I have sent a medication Janumet which I want her to initially start taking once a day until her stomach gets used to it for about for 5 days and then go up to taking it twice a day, we should recheck her A1c in 3 months Caryl Pina, MD [facility] 03/15/2018, 7:55 AM

## 2018-03-15 NOTE — Telephone Encounter (Signed)
Aware of results. 

## 2018-03-23 ENCOUNTER — Telehealth: Payer: Self-pay | Admitting: Family Medicine

## 2018-03-25 DIAGNOSIS — R300 Dysuria: Secondary | ICD-10-CM | POA: Diagnosis not present

## 2018-03-25 DIAGNOSIS — J441 Chronic obstructive pulmonary disease with (acute) exacerbation: Secondary | ICD-10-CM | POA: Diagnosis not present

## 2018-03-27 DIAGNOSIS — R0902 Hypoxemia: Secondary | ICD-10-CM | POA: Diagnosis not present

## 2018-04-04 ENCOUNTER — Encounter: Payer: Self-pay | Admitting: Family Medicine

## 2018-04-04 DIAGNOSIS — Z79899 Other long term (current) drug therapy: Secondary | ICD-10-CM | POA: Diagnosis not present

## 2018-04-04 DIAGNOSIS — G8929 Other chronic pain: Secondary | ICD-10-CM | POA: Diagnosis not present

## 2018-04-04 DIAGNOSIS — M542 Cervicalgia: Secondary | ICD-10-CM | POA: Diagnosis not present

## 2018-04-04 DIAGNOSIS — M545 Low back pain: Secondary | ICD-10-CM | POA: Diagnosis not present

## 2018-04-04 DIAGNOSIS — M129 Arthropathy, unspecified: Secondary | ICD-10-CM | POA: Diagnosis not present

## 2018-04-11 ENCOUNTER — Ambulatory Visit: Payer: Medicaid Other | Admitting: Family Medicine

## 2018-04-13 DIAGNOSIS — M5136 Other intervertebral disc degeneration, lumbar region: Secondary | ICD-10-CM | POA: Diagnosis not present

## 2018-04-13 DIAGNOSIS — Z79899 Other long term (current) drug therapy: Secondary | ICD-10-CM | POA: Diagnosis not present

## 2018-04-13 DIAGNOSIS — G894 Chronic pain syndrome: Secondary | ICD-10-CM | POA: Diagnosis not present

## 2018-04-13 DIAGNOSIS — G4700 Insomnia, unspecified: Secondary | ICD-10-CM | POA: Diagnosis not present

## 2018-04-14 ENCOUNTER — Ambulatory Visit: Payer: Medicaid Other | Admitting: Family Medicine

## 2018-04-14 ENCOUNTER — Encounter: Payer: Self-pay | Admitting: Family Medicine

## 2018-04-14 VITALS — BP 136/77 | HR 119 | Temp 98.5°F | Ht 64.0 in | Wt 169.4 lb

## 2018-04-14 DIAGNOSIS — E1169 Type 2 diabetes mellitus with other specified complication: Secondary | ICD-10-CM | POA: Diagnosis not present

## 2018-04-14 DIAGNOSIS — E782 Mixed hyperlipidemia: Secondary | ICD-10-CM

## 2018-04-14 DIAGNOSIS — I10 Essential (primary) hypertension: Secondary | ICD-10-CM | POA: Diagnosis not present

## 2018-04-14 MED ORDER — FLUTICASONE FUROATE-VILANTEROL 100-25 MCG/INH IN AEPB: 1.0000 | INHALATION_SPRAY | Freq: Every day | RESPIRATORY_TRACT | 3 refills | Status: DC

## 2018-04-14 MED ORDER — ROSUVASTATIN CALCIUM 20 MG PO TABS: 20.0000 mg | ORAL_TABLET | Freq: Every day | ORAL | 3 refills | Status: DC

## 2018-04-14 MED ORDER — NYSTATIN 100000 UNIT/ML MT SUSP: 5.0000 mL | Freq: Four times a day (QID) | OROMUCOSAL | 0 refills | Status: DC

## 2018-04-14 NOTE — Progress Notes (Signed)
BP 136/77   Pulse (!) 119   Temp 98.5 F (36.9 C) (Oral)   Ht 5\' 4"  (1.626 m)   Wt 169 lb 6.4 oz (76.8 kg)   BMI 29.08 kg/m    Subjective:    Patient ID: Margaret Vaughn, female    DOB: November 20, 1960, 57 y.o.   MRN: [id number]  HPI: Margaret Vaughn is a 57 y.o. female presenting on 04/14/2018 for Hypertension (1 month follow up)   HPI Hypertension Patient is currently on no blood pressure medication currently, and their blood pressure today is 136/77. Patient denies any lightheadedness or dizziness. Patient denies headaches, blurred vision, chest pains, shortness of breath, or weakness. Denies any side effects from medication and is content with current medication.   Hyperlipidemia Patient is coming in for recheck of his hyperlipidemia. The patient is currently taking fish oil and Crestor. They deny any issues with myalgias or history of liver damage from it. They deny any focal numbness or weakness or chest pain.   Type 2 diabetes mellitus Patient comes in today for recheck of his diabetes. Patient has been currently taking Janumet and Lyrica. Patient is not currently on an ACE inhibitor/ARB. Patient has not seen an ophthalmologist this year. Patient denies any issues with their feet.  Is on Lyrica for neuropathy and says that it is working well for her  Relevant past medical, surgical, family and social history reviewed and updated as indicated. Interim medical history since our last visit reviewed. Allergies and medications reviewed and updated.  Review of Systems  Constitutional: Negative for chills and fever.  Eyes: Negative for visual disturbance.  Respiratory: Negative for chest tightness and shortness of breath.   Cardiovascular: Negative for chest pain and leg swelling.  Musculoskeletal: Negative for back pain and gait problem.  Skin: Negative for rash.  Neurological: Negative for light-headedness and headaches.  Psychiatric/Behavioral: Negative for agitation and  behavioral problems.  All other systems reviewed and are negative.   Per HPI unless specifically indicated above   Allergies as of 04/14/2018      Reactions   Iohexol Anaphylaxis   PT.GIVEN OMNI 300 FOR CT ANGIO WAS GIVEN 50 MG.BENADRYL PRIOR TO SCAN HAD NO PROBLEMS   Moxifloxacin Anaphylaxis   Shellfish Allergy Hives      Medication List        Accurate as of 04/14/18  4:44 PM. Always use your most recent med list.          BELSOMRA 20 MG Tabs Generic drug:  Suvorexant Take 20 mg by mouth at bedtime as needed (for sleep).   cyclobenzaprine 10 MG tablet Commonly known as:  FLEXERIL Take 10 mg by mouth every 6 (six) hours.   esomeprazole 40 MG capsule Commonly known as:  NEXIUM Take 1 capsule (40 mg total) by mouth daily.   Fish Oil 1000 MG Caps Take 2 capsules by mouth at bedtime.   fluticasone furoate-vilanterol 100-25 MCG/INH Aepb Commonly known as:  BREO ELLIPTA Inhale 1 puff into the lungs daily.   HAIR SKIN NAILS PO Take 3 tablets by mouth daily.   multivitamin with minerals tablet Take 1 tablet by mouth daily.   Melatonin 300 MCG Tabs Take 1 tablet by mouth at bedtime.   nystatin 100000 UNIT/ML suspension Commonly known as:  MYCOSTATIN Take 5 mLs (500,000 Units total) by mouth 4 (four) times daily.   oxyCODONE-acetaminophen 10-325 MG tablet Commonly known as:  PERCOCET Take 1 tablet by mouth every 8 (eight) hours as  needed for pain.   pregabalin 150 MG capsule Commonly known as:  LYRICA Take 150 mg by mouth daily.   PROBIOTIC-10 Caps Take 1 tablet by mouth daily.   rosuvastatin 20 MG tablet Commonly known as:  CRESTOR Take 1 tablet (20 mg total) by mouth daily.   sitaGLIPtin-metformin 50-1000 MG tablet Commonly known as:  JANUMET Take 1 tablet by mouth 2 (two) times daily with a meal.   venlafaxine XR 150 MG 24 hr capsule Commonly known as:  EFFEXOR-XR TAKE 1 CAPSULE BY MOUTH EVERY DAY   Vitamin B-12 5000 MCG Tbdp Take 5,000 mcg by  mouth daily.   VITAMIN D (ERGOCALCIFEROL) PO Take 1 capsule by mouth daily.          Objective:    BP 136/77   Pulse (!) 119   Temp 98.5 F (36.9 C) (Oral)   Ht 5\' 4"  (1.626 m)   Wt 169 lb 6.4 oz (76.8 kg)   BMI 29.08 kg/m   Wt Readings from Last 3 Encounters:  04/14/18 169 lb 6.4 oz (76.8 kg)  03/11/18 171 lb 6.4 oz (77.7 kg)  11/22/16 178 lb (80.7 kg)    Physical Exam  Constitutional: She is oriented to person, place, and time. She appears well-developed and well-nourished. No distress.  Eyes: Conjunctivae are normal.  Neck: Neck supple. No thyromegaly present.  Cardiovascular: Normal rate, regular rhythm, normal heart sounds and intact distal pulses.  No murmur heard. Pulmonary/Chest: Effort normal and breath sounds normal. No respiratory distress. She has no wheezes.  Musculoskeletal: Normal range of motion. She exhibits no edema.  Lymphadenopathy:    She has no cervical adenopathy.  Neurological: She is alert and oriented to person, place, and time. Coordination normal.  Skin: Skin is warm and dry. No rash noted. She is not diaphoretic.  Psychiatric: She has a normal mood and affect. Her behavior is normal.  Nursing note and vitals reviewed.       Assessment & Plan:   Problem List Items Addressed This Visit      Cardiovascular and Mediastinum   Essential hypertension, benign   Relevant Medications   rosuvastatin (CRESTOR) 20 MG tablet     Endocrine   Type 2 diabetes mellitus with other specified complication (HCC) - Primary   Relevant Medications   rosuvastatin (CRESTOR) 20 MG tablet     Other   Hyperlipidemia   Relevant Medications   rosuvastatin (CRESTOR) 20 MG tablet      Continue Janumet and Crestor Follow up plan: Return in about 2 months (around 06/14/2018), or if symptoms worsen or fail to improve, for Type 2 diabetes cholesterol and hypertension recheck.  Counseling provided for all of the vaccine components No orders of the defined  types were placed in this encounter.   Caryl Pina, MD [facility] 04/14/2018, 4:44 PM

## 2018-04-19 ENCOUNTER — Telehealth: Payer: Self-pay

## 2018-04-19 NOTE — Telephone Encounter (Signed)
Medicaid non preferred Breo Ellipta  Preferred are Advair Diskus  Dulera Inhaler and Symbicort inhaler

## 2018-04-20 MED ORDER — FLUTICASONE-SALMETEROL 100-50 MCG/DOSE IN AEPB: 1.0000 | INHALATION_SPRAY | Freq: Two times a day (BID) | RESPIRATORY_TRACT | 3 refills | Status: DC

## 2018-04-20 NOTE — Telephone Encounter (Signed)
I sent Advair Diskus for patient because the Brio was not covered

## 2018-04-20 NOTE — Addendum Note (Signed)
Addended by: Caryl Pina on: 04/20/2018 03:53 PM   Modules accepted: Orders

## 2018-04-20 NOTE — Telephone Encounter (Signed)
Patient's phone voice mail full.     Details of change in prescribed inhalors, due to insurance,left on husband's voice mail.

## 2018-04-26 DIAGNOSIS — R0902 Hypoxemia: Secondary | ICD-10-CM | POA: Diagnosis not present

## 2018-05-10 DIAGNOSIS — G894 Chronic pain syndrome: Secondary | ICD-10-CM | POA: Diagnosis not present

## 2018-05-10 DIAGNOSIS — R079 Chest pain, unspecified: Secondary | ICD-10-CM | POA: Diagnosis not present

## 2018-05-10 DIAGNOSIS — Z79899 Other long term (current) drug therapy: Secondary | ICD-10-CM | POA: Diagnosis not present

## 2018-05-27 DIAGNOSIS — R0902 Hypoxemia: Secondary | ICD-10-CM | POA: Diagnosis not present
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
## 2018-06-09 ENCOUNTER — Other Ambulatory Visit: Payer: Self-pay | Admitting: Family Medicine

## 2018-06-09 NOTE — Telephone Encounter (Signed)
OV 06/27/18

## 2018-06-10 DIAGNOSIS — Z79899 Other long term (current) drug therapy: Secondary | ICD-10-CM | POA: Diagnosis not present

## 2018-06-14 ENCOUNTER — Encounter (INDEPENDENT_AMBULATORY_CARE_PROVIDER_SITE_OTHER): Payer: Self-pay

## 2018-06-14 ENCOUNTER — Encounter (HOSPITAL_COMMUNITY): Payer: Self-pay | Admitting: Psychiatry

## 2018-06-14 ENCOUNTER — Ambulatory Visit (INDEPENDENT_AMBULATORY_CARE_PROVIDER_SITE_OTHER): Payer: Medicaid Other | Admitting: Psychiatry

## 2018-06-14 VITALS — BP 144/88 | HR 104 | Ht 64.0 in | Wt 170.0 lb

## 2018-06-14 DIAGNOSIS — F331 Major depressive disorder, recurrent, moderate: Secondary | ICD-10-CM

## 2018-06-14 DIAGNOSIS — F4310 Post-traumatic stress disorder, unspecified: Secondary | ICD-10-CM

## 2018-06-14 MED ORDER — VENLAFAXINE HCL ER 75 MG PO CP24: ORAL_CAPSULE | ORAL | 0 refills | Status: DC

## 2018-06-14 NOTE — Patient Instructions (Signed)
1. Increase Effexor 225 mg daily 2. Check TSH 3. Return to clinic in one month for 30 mins

## 2018-06-26 DIAGNOSIS — R0902 Hypoxemia: Secondary | ICD-10-CM | POA: Diagnosis not present

## 2018-06-27 ENCOUNTER — Encounter: Payer: Self-pay | Admitting: Family Medicine

## 2018-06-27 ENCOUNTER — Ambulatory Visit: Payer: Medicaid Other | Admitting: Family Medicine

## 2018-06-27 VITALS — BP 156/82 | HR 100 | Temp 98.1°F | Ht 64.0 in | Wt 173.8 lb

## 2018-06-27 DIAGNOSIS — R809 Proteinuria, unspecified: Secondary | ICD-10-CM

## 2018-06-27 DIAGNOSIS — L308 Other specified dermatitis: Secondary | ICD-10-CM

## 2018-06-27 DIAGNOSIS — I10 Essential (primary) hypertension: Secondary | ICD-10-CM | POA: Diagnosis not present

## 2018-06-27 DIAGNOSIS — E1169 Type 2 diabetes mellitus with other specified complication: Secondary | ICD-10-CM | POA: Diagnosis not present

## 2018-06-27 DIAGNOSIS — K219 Gastro-esophageal reflux disease without esophagitis: Secondary | ICD-10-CM

## 2018-06-27 DIAGNOSIS — J410 Simple chronic bronchitis: Secondary | ICD-10-CM | POA: Diagnosis not present

## 2018-06-27 DIAGNOSIS — E782 Mixed hyperlipidemia: Secondary | ICD-10-CM

## 2018-06-27 LAB — BAYER DCA HB A1C WAIVED: HB A1C (BAYER DCA - WAIVED): 6.5 % (ref <7.0)

## 2018-06-27 LAB — URINALYSIS, COMPLETE
Bilirubin, UA: NEGATIVE
Glucose, UA: NEGATIVE
Ketones, UA: NEGATIVE
Nitrite, UA: NEGATIVE
RBC, UA: NEGATIVE
Specific Gravity, UA: 1.015 (ref 1.005–1.030)
Urobilinogen, Ur: 0.2 mg/dL (ref 0.2–1.0)
pH, UA: 6 (ref 5.0–7.5)

## 2018-06-27 LAB — MICROSCOPIC EXAMINATION
RBC, UA: NONE SEEN /hpf (ref 0–2)
Renal Epithel, UA: NONE SEEN /hpf

## 2018-06-27 MED ORDER — FLUTICASONE PROPIONATE 50 MCG/ACT NA SUSP: 1.0000 | Freq: Two times a day (BID) | NASAL | 6 refills | Status: DC | PRN

## 2018-06-27 MED ORDER — ENALAPRIL MALEATE 2.5 MG PO TABS: 2.5000 mg | ORAL_TABLET | Freq: Every day | ORAL | 3 refills | Status: DC

## 2018-06-27 NOTE — Progress Notes (Signed)
BP (!) 156/82   Pulse 100   Temp 98.1 F (36.7 C) (Oral)   Ht 5\' 4"  (1.626 m)   Wt 173 lb 12.8 oz (78.8 kg)   SpO2 (!) 89%   BMI 29.83 kg/m    Subjective:    Patient ID: Margaret Vaughn, female    DOB: 1961/05/17, 57 y.o.   MRN: [id number]  HPI: Margaret Vaughn is a 57 y.o. female presenting on 06/27/2018 for Diabetes (2 month follow up); Nasal Congestion (x 2 days); and Cough   HPI Type 2 diabetes mellitus Patient comes in today for recheck of his diabetes. Patient has been currently taking Janumet. Patient is not currently on an ACE inhibitor/ARB. Patient has not seen an ophthalmologist this year. Patient denies any issues with their feet.   Hyperlipidemia Patient is coming in for recheck of his hyperlipidemia. The patient is currently taking Crestor. They deny any issues with myalgias or history of liver damage from it. They deny any focal numbness or weakness or chest pain.   GERD Patient is currently on Nexium.  She denies any major symptoms or abdominal pain or belching or burping. She denies any blood in her stool or lightheadedness or dizziness.   Hypertension Patient is currently on enalapril, and their blood pressure today is 156/82. Patient denies any lightheadedness or dizziness. Patient denies headaches, blurred vision, chest pains, shortness of breath, or weakness. Denies any side effects from medication and is content with current medication.   COPD Patient is coming in for COPD recheck today.  He is currently on Advair.  He has a mild chronic cough but denies any major coughing spells or wheezing spells.  He has 2nighttime symptoms per week and 2daytime symptoms per week currently.   Proteinurea at her pain management doctor and is coming in for follow-up today.  She denies any major symptoms from it but was told to follow-up on the urinalysis that showed 2+ protein.  Patient has a scaly painful itchy rash on her thumb mostly on the medial aspect but also  extends across into the nail and some onto the palmar side of the thumb as well.  She says she is been fighting this off and on for quite some time and has been using triple antibiotic ointment on it but nothing else to this point.  She feels like it worsens especially in winter.  Relevant past medical, surgical, family and social history reviewed and updated as indicated. Interim medical history since our last visit reviewed. Allergies and medications reviewed and updated.  Review of Systems  Constitutional: Negative for chills and fever.  Eyes: Negative for redness and visual disturbance.  Respiratory: Negative for chest tightness and shortness of breath.   Cardiovascular: Negative for chest pain and leg swelling.  Gastrointestinal: Negative for abdominal distention, abdominal pain, constipation, diarrhea and vomiting.  Musculoskeletal: Negative for back pain and gait problem.  Skin: Negative for rash.  Neurological: Negative for light-headedness and headaches.  Psychiatric/Behavioral: Negative for agitation and behavioral problems.  All other systems reviewed and are negative.  Per HPI unless specifically indicated above   Allergies as of 06/27/2018      Reactions   Iohexol Anaphylaxis   PT.GIVEN OMNI 300 FOR CT ANGIO WAS GIVEN 50 MG.BENADRYL PRIOR TO SCAN HAD NO PROBLEMS   Moxifloxacin Anaphylaxis   Shellfish Allergy Hives      Medication List        Accurate as of 06/27/18  2:10 PM. Always use your  most recent med list.          cyclobenzaprine 10 MG tablet Commonly known as:  FLEXERIL Take 10 mg by mouth every 6 (six) hours.   enalapril 2.5 MG tablet Commonly known as:  VASOTEC Take 1 tablet (2.5 mg total) by mouth daily.   esomeprazole 40 MG capsule Commonly known as:  NEXIUM Take 1 capsule (40 mg total) by mouth daily.   Fish Oil 1000 MG Caps Take 2 capsules by mouth at bedtime.   fluticasone 50 MCG/ACT nasal spray Commonly known as:  FLONASE Place 1 spray  into both nostrils 2 (two) times daily as needed for allergies or rhinitis.   Fluticasone-Salmeterol 100-50 MCG/DOSE Aepb Commonly known as:  ADVAIR Inhale 1 puff into the lungs 2 (two) times daily.   HAIR SKIN NAILS PO Take 3 tablets by mouth daily.   multivitamin with minerals tablet Take 1 tablet by mouth daily.   Melatonin 300 MCG Tabs Take 1 tablet by mouth at bedtime.   nystatin 100000 UNIT/ML suspension Commonly known as:  MYCOSTATIN Take 5 mLs (500,000 Units total) by mouth 4 (four) times daily.   oxyCODONE-acetaminophen 10-325 MG tablet Commonly known as:  PERCOCET Take 1 tablet by mouth every 8 (eight) hours as needed for pain.   pregabalin 150 MG capsule Commonly known as:  LYRICA Take 150 mg by mouth daily.   PROBIOTIC-10 Caps Take 1 tablet by mouth daily.   rosuvastatin 20 MG tablet Commonly known as:  CRESTOR Take 1 tablet (20 mg total) by mouth daily.   sitaGLIPtin-metformin 50-1000 MG tablet Commonly known as:  JANUMET Take 1 tablet by mouth 2 (two) times daily with a meal.   venlafaxine XR 150 MG 24 hr capsule Commonly known as:  EFFEXOR-XR TAKE 1 CAPSULE BY MOUTH EVERY DAY   venlafaxine XR 75 MG 24 hr capsule Commonly known as:  EFFEXOR-XR 225 mg daily (150 mg + 75 mg daily)   Vitamin B-12 5000 MCG Tbdp Take 5,000 mcg by mouth daily.   VITAMIN D (ERGOCALCIFEROL) PO Take 1 capsule by mouth daily.          Objective:    BP (!) 156/82   Pulse 100   Temp 98.1 F (36.7 C) (Oral)   Ht 5\' 4"  (1.626 m)   Wt 173 lb 12.8 oz (78.8 kg)   SpO2 (!) 89%   BMI 29.83 kg/m   Wt Readings from Last 3 Encounters:  06/27/18 173 lb 12.8 oz (78.8 kg)  04/14/18 169 lb 6.4 oz (76.8 kg)  03/11/18 171 lb 6.4 oz (77.7 kg)    Physical Exam  Constitutional: She is oriented to person, place, and time. She appears well-developed and well-nourished. No distress.  Eyes: Conjunctivae are normal.  Neck: Neck supple. No thyromegaly present.  Cardiovascular:  Normal rate, regular rhythm, normal heart sounds and intact distal pulses.  No murmur heard. Pulmonary/Chest: Effort normal and breath sounds normal. No respiratory distress. She has no wheezes.  Musculoskeletal: Normal range of motion. She exhibits no edema.  Lymphadenopathy:    She has no cervical adenopathy.  Neurological: She is alert and oriented to person, place, and time. Coordination normal.  Skin: Skin is warm and dry. Rash (scaly plaque on thumb on lateral and medial and yellowing of thumbnail) noted. She is not diaphoretic.  Psychiatric: She has a normal mood and affect. Her behavior is normal.  Nursing note and vitals reviewed.       Assessment & Plan:   Problem  List Items Addressed This Visit      Cardiovascular and Mediastinum   Essential hypertension, benign   Relevant Medications   enalapril (VASOTEC) 2.5 MG tablet     Respiratory   COPD (chronic obstructive pulmonary disease) (HCC)   Relevant Medications   fluticasone (FLONASE) 50 MCG/ACT nasal spray     Digestive   GERD     Endocrine   Type 2 diabetes mellitus with other specified complication (HCC) - Primary   Relevant Medications   enalapril (VASOTEC) 2.5 MG tablet   Other Relevant Orders   Bayer DCA Hb A1c Waived   Urinalysis, Complete   Microalbumin / creatinine urine ratio     Other   Hyperlipidemia   Relevant Medications   enalapril (VASOTEC) 2.5 MG tablet    Other Visit Diagnoses    Other eczema       Right thumb including part of the nail, will refer to dermatology, possible psoriasis versus eczema versus fungal infection   Relevant Orders   Ambulatory referral to Dermatology   Proteinuria, unspecified type       Relevant Orders   Urinalysis, Complete   Microalbumin / creatinine urine ratio       Follow up plan: Return in about 3 months (around 09/26/2018), or if symptoms worsen or fail to improve, for Recheck diabetes and htn.  Counseling provided for all of the vaccine  components Orders Placed This Encounter  Procedures  . Bayer DCA Hb A1c Waived  . Urinalysis, Complete  . Microalbumin / creatinine urine ratio  . Ambulatory referral to Dermatology    Caryl Pina, MD [facility] 06/27/2018, 2:10 PM

## 2018-06-28 LAB — MICROALBUMIN / CREATININE URINE RATIO
Creatinine, Urine: 72.8 mg/dL
Microalb/Creat Ratio: 119.5 mg/g creat — ABNORMAL HIGH (ref 0.0–30.0)
Microalbumin, Urine: 87 ug/mL

## 2018-07-08 DIAGNOSIS — Z79899 Other long term (current) drug therapy: Secondary | ICD-10-CM | POA: Diagnosis not present

## 2018-07-14 DIAGNOSIS — L821 Other seborrheic keratosis: Secondary | ICD-10-CM | POA: Diagnosis not present

## 2018-07-14 DIAGNOSIS — B070 Plantar wart: Secondary | ICD-10-CM | POA: Diagnosis not present

## 2018-07-14 DIAGNOSIS — L299 Pruritus, unspecified: Secondary | ICD-10-CM | POA: Diagnosis not present

## 2018-07-14 DIAGNOSIS — D1801 Hemangioma of skin and subcutaneous tissue: Secondary | ICD-10-CM | POA: Diagnosis not present

## 2018-07-14 DIAGNOSIS — I781 Nevus, non-neoplastic: Secondary | ICD-10-CM | POA: Diagnosis not present

## 2018-07-14 DIAGNOSIS — B351 Tinea unguium: Secondary | ICD-10-CM | POA: Diagnosis not present

## 2018-07-14 DIAGNOSIS — L853 Xerosis cutis: Secondary | ICD-10-CM | POA: Diagnosis not present

## 2018-07-14 DIAGNOSIS — L603 Nail dystrophy: Secondary | ICD-10-CM | POA: Diagnosis not present
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
## 2018-07-25 ENCOUNTER — Ambulatory Visit (HOSPITAL_COMMUNITY): Payer: Medicaid Other | Admitting: Psychiatry

## 2018-07-25 DIAGNOSIS — N61 Mastitis without abscess: Secondary | ICD-10-CM | POA: Diagnosis not present

## 2018-07-25 DIAGNOSIS — S21001A Unspecified open wound of right breast, initial encounter: Secondary | ICD-10-CM | POA: Diagnosis not present

## 2018-07-25 DIAGNOSIS — J441 Chronic obstructive pulmonary disease with (acute) exacerbation: Secondary | ICD-10-CM | POA: Diagnosis not present

## 2018-07-27 DIAGNOSIS — R0902 Hypoxemia: Secondary | ICD-10-CM | POA: Diagnosis not present

## 2018-08-12 DIAGNOSIS — Z79899 Other long term (current) drug therapy: Secondary | ICD-10-CM | POA: Diagnosis not present

## 2018-08-18 DIAGNOSIS — B078 Other viral warts: Secondary | ICD-10-CM | POA: Diagnosis not present

## 2018-08-18 DIAGNOSIS — S20111A Abrasion of breast, right breast, initial encounter: Secondary | ICD-10-CM | POA: Diagnosis not present

## 2018-08-18 DIAGNOSIS — B079 Viral wart, unspecified: Secondary | ICD-10-CM | POA: Diagnosis not present

## 2018-08-27 DIAGNOSIS — R0902 Hypoxemia: Secondary | ICD-10-CM | POA: Diagnosis not present

## 2018-09-09 DIAGNOSIS — Z79899 Other long term (current) drug therapy: Secondary | ICD-10-CM | POA: Diagnosis not present

## 2018-09-16 ENCOUNTER — Other Ambulatory Visit: Payer: Self-pay | Admitting: Family Medicine

## 2018-09-21 DIAGNOSIS — B078 Other viral warts: Secondary | ICD-10-CM | POA: Diagnosis not present

## 2018-09-21 DIAGNOSIS — B079 Viral wart, unspecified: Secondary | ICD-10-CM | POA: Diagnosis not present

## 2018-09-25 DIAGNOSIS — R0902 Hypoxemia: Secondary | ICD-10-CM | POA: Diagnosis not present

## 2018-09-27 ENCOUNTER — Encounter: Payer: Self-pay | Admitting: Family Medicine

## 2018-09-27 ENCOUNTER — Ambulatory Visit: Payer: Medicaid Other | Admitting: Family Medicine

## 2018-09-27 VITALS — BP 141/87 | HR 106 | Temp 97.7°F | Ht 64.0 in | Wt 175.6 lb

## 2018-09-27 DIAGNOSIS — I10 Essential (primary) hypertension: Secondary | ICD-10-CM

## 2018-09-27 DIAGNOSIS — G8929 Other chronic pain: Secondary | ICD-10-CM

## 2018-09-27 DIAGNOSIS — J441 Chronic obstructive pulmonary disease with (acute) exacerbation: Secondary | ICD-10-CM | POA: Diagnosis not present

## 2018-09-27 DIAGNOSIS — M542 Cervicalgia: Secondary | ICD-10-CM

## 2018-09-27 DIAGNOSIS — E663 Overweight: Secondary | ICD-10-CM

## 2018-09-27 DIAGNOSIS — Z720 Tobacco use: Secondary | ICD-10-CM

## 2018-09-27 DIAGNOSIS — E782 Mixed hyperlipidemia: Secondary | ICD-10-CM | POA: Diagnosis not present

## 2018-09-27 DIAGNOSIS — E1169 Type 2 diabetes mellitus with other specified complication: Secondary | ICD-10-CM

## 2018-09-27 MED ORDER — PREDNISONE 20 MG PO TABS: ORAL_TABLET | ORAL | 0 refills | Status: DC

## 2018-09-27 MED ORDER — AZITHROMYCIN 250 MG PO TABS: ORAL_TABLET | ORAL | 0 refills | Status: DC

## 2018-09-27 MED ORDER — ENALAPRIL MALEATE 5 MG PO TABS: 5.0000 mg | ORAL_TABLET | Freq: Every day | ORAL | 3 refills | Status: DC

## 2018-09-27 MED ORDER — BENZONATATE 100 MG PO CAPS: 100.0000 mg | ORAL_CAPSULE | Freq: Two times a day (BID) | ORAL | 0 refills | Status: DC | PRN

## 2018-09-27 MED ORDER — ALBUTEROL SULFATE HFA 108 (90 BASE) MCG/ACT IN AERS: 2.0000 | INHALATION_SPRAY | Freq: Four times a day (QID) | RESPIRATORY_TRACT | 0 refills | Status: DC | PRN

## 2018-09-27 MED ORDER — ATORVASTATIN CALCIUM 40 MG PO TABS: 40.0000 mg | ORAL_TABLET | Freq: Every day | ORAL | 3 refills | Status: DC

## 2018-09-27 MED ORDER — SITAGLIPTIN PHOS-METFORMIN HCL 50-1000 MG PO TABS: 1.0000 | ORAL_TABLET | Freq: Two times a day (BID) | ORAL | 3 refills | Status: DC

## 2018-09-27 NOTE — Progress Notes (Signed)
BP (!) 141/87   Pulse (!) 106   Temp 97.7 F (36.5 C) (Oral)   Ht 5' 4"  (1.626 m)   Wt 175 lb 9.6 oz (79.7 kg)   SpO2 94%   BMI 30.14 kg/m    Subjective:    Patient ID: Margaret Vaughn, female    DOB: 1960/10/23, 58 y.o.   MRN: [id number]  HPI: Margaret Vaughn is a 58 y.o. female presenting on 09/27/2018 for Diabetes (3 month follow up); Hypertension; and Shortness of Breath (Patient states that it has been ongoing but has been getting worse x 1 week)   HPI Type 2 diabetes mellitus Patient comes in today for recheck of his diabetes. Patient has been currently taking Janumet but says she is been out for a week because of a prior authorization, gave samples today. Patient is currently on an ACE inhibitor/ARB. Patient has not seen an ophthalmologist this year. Patient denies any issues with their feet.   Hyperlipidemia Patient is coming in for recheck of his hyperlipidemia. The patient is currently taking fish oil and Crestor. They deny any issues with myalgias or history of liver damage from it. They deny any focal numbness or weakness or chest pain.   Hypertension Patient is currently on enalapril, and their blood pressure today is 141/87 but says is been higher when she is gone to other 2 doctors offices recently. Patient denies any lightheadedness or dizziness. Patient denies headaches, blurred vision, chest pains, shortness of breath, or weakness. Denies any side effects from medication and is content with current medication.   Trouble breathing and COPD acting up Patient is coming in complaining of trouble breathing and COPD acting up.  She says this is been bothering her more over the past week.  She says she did run out of her albuterol inhaler and she has been using it frequently.  She does admit that she is still smoking 2 packs/day and has no intentions of quitting.  She does have desires to quit but no actual intentions of quitting currently.  She says she has been wheezing  and coughing.  She denies any fevers or chills.  She does admit to having some shortness of breath along with it.  Patient has chronic neck pain and had been seeing a chiropractor Dr. Lovena Le in [place] and then had the change of insurance and she just needs a new referral back to him.  Relevant past medical, surgical, family and social history reviewed and updated as indicated. Interim medical history since our last visit reviewed. Allergies and medications reviewed and updated.  Review of Systems  Constitutional: Negative for chills and fever.  HENT: Positive for congestion. Negative for ear discharge, ear pain, sinus pressure, sneezing and sore throat.   Eyes: Negative for redness and visual disturbance.  Respiratory: Positive for cough, shortness of breath and wheezing. Negative for chest tightness.   Cardiovascular: Negative for chest pain and leg swelling.  Musculoskeletal: Positive for arthralgias and neck pain. Negative for back pain and gait problem.  Skin: Negative for rash.  Neurological: Negative for dizziness, light-headedness and headaches.  Psychiatric/Behavioral: Negative for agitation and behavioral problems.  All other systems reviewed and are negative.   Per HPI unless specifically indicated above   Allergies as of 09/27/2018      Reactions   Iohexol Anaphylaxis   PT.GIVEN OMNI 300 FOR CT ANGIO WAS GIVEN 50 MG.BENADRYL PRIOR TO SCAN HAD NO PROBLEMS   Moxifloxacin Anaphylaxis   Shellfish Allergy Hives  Medication List       Accurate as of September 27, 2018  2:50 PM. Always use your most recent med list.        albuterol 108 (90 Base) MCG/ACT inhaler Commonly known as:  PROVENTIL HFA;VENTOLIN HFA Inhale 2 puffs into the lungs every 6 (six) hours as needed for wheezing or shortness of breath.   azithromycin 250 MG tablet Commonly known as:  ZITHROMAX Take 2 the first day and then one each day after.   cyclobenzaprine 10 MG tablet Commonly known as:   FLEXERIL Take 10 mg by mouth every 6 (six) hours.   enalapril 5 MG tablet Commonly known as:  VASOTEC Take 1 tablet (5 mg total) by mouth daily.   esomeprazole 40 MG capsule Commonly known as:  NEXIUM Take 1 capsule (40 mg total) by mouth daily.   Fish Oil 1000 MG Caps Take 2 capsules by mouth at bedtime.   fluticasone 50 MCG/ACT nasal spray Commonly known as:  FLONASE Place 1 spray into both nostrils 2 (two) times daily as needed for allergies or rhinitis.   Fluticasone-Salmeterol 100-50 MCG/DOSE Aepb Commonly known as:  ADVAIR Inhale 1 puff into the lungs 2 (two) times daily.   HAIR SKIN NAILS PO Take 3 tablets by mouth daily.   multivitamin with minerals tablet Take 1 tablet by mouth daily.   Melatonin 300 MCG Tabs Take 1 tablet by mouth at bedtime.   nystatin 100000 UNIT/ML suspension Commonly known as:  MYCOSTATIN Take 5 mLs (500,000 Units total) by mouth 4 (four) times daily.   oxyCODONE-acetaminophen 10-325 MG tablet Commonly known as:  PERCOCET Take 1 tablet by mouth every 8 (eight) hours as needed for pain.   predniSONE 20 MG tablet Commonly known as:  DELTASONE 2 po at same time daily for 5 days   pregabalin 150 MG capsule Commonly known as:  LYRICA Take 150 mg by mouth daily.   PROBIOTIC-10 Caps Take 1 tablet by mouth daily.   rosuvastatin 20 MG tablet Commonly known as:  CRESTOR Take 1 tablet (20 mg total) by mouth daily.   sitaGLIPtin-metformin 50-1000 MG tablet Commonly known as:  JANUMET Take 1 tablet by mouth 2 (two) times daily with a meal.   venlafaxine XR 75 MG 24 hr capsule Commonly known as:  EFFEXOR-XR 225 mg daily (150 mg + 75 mg daily)   venlafaxine XR 150 MG 24 hr capsule Commonly known as:  EFFEXOR-XR Take 1 capsule (150 mg total) by mouth daily. (total 225 mg daily)   Vitamin B-12 5000 MCG Tbdp Take 5,000 mcg by mouth daily.   VITAMIN D (ERGOCALCIFEROL) PO Take 1 capsule by mouth daily.          Objective:    BP  (!) 141/87   Pulse (!) 106   Temp 97.7 F (36.5 C) (Oral)   Ht 5' 4"  (1.626 m)   Wt 175 lb 9.6 oz (79.7 kg)   SpO2 94%   BMI 30.14 kg/m   Wt Readings from Last 3 Encounters:  09/27/18 175 lb 9.6 oz (79.7 kg)  06/27/18 173 lb 12.8 oz (78.8 kg)  04/14/18 169 lb 6.4 oz (76.8 kg)    Physical Exam Vitals signs and nursing note reviewed.  Constitutional:      General: She is not in acute distress.    Appearance: She is well-developed. She is not diaphoretic.  HENT:     Right Ear: Tympanic membrane, ear canal and external ear normal.  Left Ear: Tympanic membrane, ear canal and external ear normal.     Nose: Mucosal edema and rhinorrhea present.     Right Sinus: No maxillary sinus tenderness or frontal sinus tenderness.     Left Sinus: No maxillary sinus tenderness or frontal sinus tenderness.     Mouth/Throat:     Pharynx: Uvula midline. Posterior oropharyngeal erythema present. No oropharyngeal exudate.     Tonsils: No tonsillar abscesses.  Eyes:     Conjunctiva/sclera: Conjunctivae normal.  Cardiovascular:     Rate and Rhythm: Normal rate and regular rhythm.     Heart sounds: Normal heart sounds. No murmur.  Pulmonary:     Effort: Pulmonary effort is normal. No respiratory distress.     Breath sounds: Wheezing and rhonchi present.  Musculoskeletal: Normal range of motion.        General: No tenderness.  Skin:    General: Skin is warm and dry.     Findings: No rash.  Neurological:     Mental Status: She is alert and oriented to person, place, and time.     Coordination: Coordination normal.  Psychiatric:        Behavior: Behavior normal.         Assessment & Plan:   Problem List Items Addressed This Visit      Cardiovascular and Mediastinum   Essential hypertension, benign   Relevant Medications   enalapril (VASOTEC) 5 MG tablet   Other Relevant Orders   CMP14+EGFR     Respiratory   COPD (chronic obstructive pulmonary disease) (HCC)   Relevant Medications     azithromycin (ZITHROMAX) 250 MG tablet   predniSONE (DELTASONE) 20 MG tablet   albuterol (PROVENTIL HFA;VENTOLIN HFA) 108 (90 Base) MCG/ACT inhaler     Endocrine   Type 2 diabetes mellitus with other specified complication (HCC) - Primary   Relevant Medications   sitaGLIPtin-metformin (JANUMET) 50-1000 MG tablet   enalapril (VASOTEC) 5 MG tablet   Other Relevant Orders   CBC with Differential/Platelet   CMP14+EGFR   Bayer DCA Hb A1c Waived     Other   Hyperlipidemia   Relevant Medications   enalapril (VASOTEC) 5 MG tablet   Other Relevant Orders   Lipid panel   Tobacco abuse   Overweight    Other Visit Diagnoses    Chronic neck pain       Relevant Medications   predniSONE (DELTASONE) 20 MG tablet   Other Relevant Orders   Ambulatory referral to Chiropractic      Will treat for COPD exacerbation with azithromycin and Tessalon Perles and prednisone, refill her Advair  Restart her Janumet because she ran out of it a week ago and gave samples of it today as that is currently under prior authorization. Follow up plan: Return in about 3 months (around 12/28/2018), or if symptoms worsen or fail to improve, for Diabetes and hypertension.  Counseling provided for all of the vaccine components Orders Placed This Encounter  Procedures  . CBC with Differential/Platelet  . CMP14+EGFR  . Lipid panel  . Bayer DCA Hb A1c Waived  . Ambulatory referral to [place], MD [place] 09/27/2018, 2:50 PM

## 2018-09-29 ENCOUNTER — Telehealth: Payer: Self-pay

## 2018-09-29 DIAGNOSIS — J441 Chronic obstructive pulmonary disease with (acute) exacerbation: Secondary | ICD-10-CM

## 2018-09-29 MED ORDER — PROVENTIL HFA 108 (90 BASE) MCG/ACT IN AERS: 1.0000 | INHALATION_SPRAY | Freq: Four times a day (QID) | RESPIRATORY_TRACT | 11 refills | Status: DC | PRN

## 2018-09-29 NOTE — Telephone Encounter (Signed)
Medicaid non preferred Ventolin HFA  Preferred are Proair HFA and Proventil HFA

## 2018-09-29 NOTE — Telephone Encounter (Signed)
I sent Proventil for the patient

## 2018-09-30 NOTE — Telephone Encounter (Signed)
Husband aware of new medication.

## 2018-10-07 DIAGNOSIS — Z79899 Other long term (current) drug therapy: Secondary | ICD-10-CM | POA: Diagnosis not present

## 2018-10-13 ENCOUNTER — Other Ambulatory Visit: Payer: Self-pay | Admitting: Nurse Practitioner

## 2018-10-13 DIAGNOSIS — M4802 Spinal stenosis, cervical region: Secondary | ICD-10-CM

## 2018-10-16 ENCOUNTER — Other Ambulatory Visit: Payer: Self-pay | Admitting: Family Medicine

## 2018-10-26 DIAGNOSIS — R0902 Hypoxemia: Secondary | ICD-10-CM | POA: Diagnosis not present

## 2018-11-04 ENCOUNTER — Other Ambulatory Visit: Payer: Self-pay

## 2018-11-10 ENCOUNTER — Other Ambulatory Visit: Payer: Self-pay | Admitting: Family Medicine

## 2018-11-10 DIAGNOSIS — J441 Chronic obstructive pulmonary disease with (acute) exacerbation: Secondary | ICD-10-CM

## 2018-11-16 DIAGNOSIS — B070 Plantar wart: Secondary | ICD-10-CM | POA: Diagnosis not present

## 2018-11-16 DIAGNOSIS — B351 Tinea unguium: Secondary | ICD-10-CM | POA: Diagnosis not present

## 2018-11-25 ENCOUNTER — Ambulatory Visit (INDEPENDENT_AMBULATORY_CARE_PROVIDER_SITE_OTHER): Payer: Medicaid Other | Admitting: Family

## 2018-11-25 ENCOUNTER — Encounter: Payer: Self-pay | Admitting: Family

## 2018-11-25 ENCOUNTER — Other Ambulatory Visit: Payer: Self-pay

## 2018-11-25 DIAGNOSIS — R399 Unspecified symptoms and signs involving the genitourinary system: Secondary | ICD-10-CM

## 2018-11-25 DIAGNOSIS — R0902 Hypoxemia: Secondary | ICD-10-CM | POA: Diagnosis not present

## 2018-11-25 MED ORDER — CEPHALEXIN 500 MG PO CAPS: 500.0000 mg | ORAL_CAPSULE | Freq: Two times a day (BID) | ORAL | 0 refills | Status: DC

## 2018-11-25 NOTE — Progress Notes (Signed)
   Virtual Visit via telephone Note  I connected with Margaret Vaughn on 11/25/18 at 2:57 pm by telephone and verified that I am speaking with the correct person using two identifiers. Margaret Vaughn is currently located at home  and son is currently with her during visit. The provider, Evelina Dun, FNP is located in their office at time of visit.  I discussed the limitations, risks, security and privacy concerns of performing an evaluation and management service by telephone and the availability of in person appointments. I also discussed with the patient that there may be a patient responsible charge related to this service. The patient expressed understanding and agreed to proceed.   History and Present Illness:  Dysuria   The current episode started 1 to 4 weeks ago. The problem occurs every urination. The problem has been gradually worsening. The quality of the pain is described as burning. The pain is at a severity of 9/10. The pain is moderate. Maximum temperature: 99. Associated symptoms include chills, frequency, nausea and urgency. Pertinent negatives include no discharge, hematuria or vomiting. She has tried acetaminophen for the symptoms. The treatment provided mild relief.      Review of Systems  Constitutional: Positive for chills.  Gastrointestinal: Positive for nausea. Negative for vomiting.  Genitourinary: Positive for dysuria, frequency and urgency. Negative for hematuria.  All other systems reviewed and are negative.    Observations/Objective: No SOB or distress noted  Assessment and Plan: 1. UTI symptoms Force fluids AZO over the counter X2 days RTO if symptoms worsen or do not improve  - cephALEXin (KEFLEX) 500 MG capsule; Take 1 capsule (500 mg total) by mouth 2 (two) times daily.  Dispense: 14 capsule; Refill: 0     I discussed the assessment and treatment plan with the patient. The patient was provided an opportunity to ask questions and all were  answered. The patient agreed with the plan and demonstrated an understanding of the instructions.   The patient was advised to call back or seek an in-person evaluation if the symptoms worsen or if the condition fails to improve as anticipated.  The above assessment and management plan was discussed with the patient. The patient verbalized understanding of and has agreed to the management plan. Patient is aware to call the clinic if symptoms persist or worsen. Patient is aware when to return to the clinic for a follow-up visit. Patient educated on when it is appropriate to go to the emergency department.   Time call ended:  3:06 pm  I provided 9 minutes of non-face-to-face time during this encounter.    Evelina Dun, FNP

## 2018-12-09 DIAGNOSIS — Z79891 Long term (current) use of opiate analgesic: Secondary | ICD-10-CM | POA: Diagnosis not present

## 2018-12-09 DIAGNOSIS — G894 Chronic pain syndrome: Secondary | ICD-10-CM | POA: Diagnosis not present

## 2018-12-09 DIAGNOSIS — Z79899 Other long term (current) drug therapy: Secondary | ICD-10-CM | POA: Diagnosis not present

## 2018-12-09 DIAGNOSIS — M5136 Other intervertebral disc degeneration, lumbar region: Secondary | ICD-10-CM | POA: Diagnosis not present

## 2018-12-12 DIAGNOSIS — M5136 Other intervertebral disc degeneration, lumbar region: Secondary | ICD-10-CM | POA: Diagnosis not present

## 2018-12-12 DIAGNOSIS — Z9189 Other specified personal risk factors, not elsewhere classified: Secondary | ICD-10-CM | POA: Diagnosis not present

## 2018-12-12 DIAGNOSIS — G894 Chronic pain syndrome: Secondary | ICD-10-CM | POA: Diagnosis not present

## 2018-12-12 DIAGNOSIS — Z79899 Other long term (current) drug therapy: Secondary | ICD-10-CM | POA: Diagnosis not present

## 2018-12-16 ENCOUNTER — Other Ambulatory Visit: Payer: Self-pay | Admitting: Family Medicine

## 2018-12-21 ENCOUNTER — Encounter: Payer: Self-pay | Admitting: Family Medicine

## 2018-12-21 ENCOUNTER — Ambulatory Visit (INDEPENDENT_AMBULATORY_CARE_PROVIDER_SITE_OTHER): Payer: Medicaid Other | Admitting: Family Medicine

## 2018-12-21 ENCOUNTER — Encounter (HOSPITAL_COMMUNITY): Payer: Self-pay | Admitting: Emergency Medicine

## 2018-12-21 ENCOUNTER — Emergency Department (HOSPITAL_COMMUNITY): Payer: Medicaid Other

## 2018-12-21 ENCOUNTER — Emergency Department (HOSPITAL_COMMUNITY)
Admission: EM | Admit: 2018-12-21 | Discharge: 2018-12-21 | Disposition: A | Discharge status: Home / Self Care | Payer: Medicaid Other | Attending: Emergency Medicine | Admitting: Emergency Medicine

## 2018-12-21 ENCOUNTER — Other Ambulatory Visit: Payer: Medicaid Other

## 2018-12-21 ENCOUNTER — Other Ambulatory Visit: Payer: Self-pay

## 2018-12-21 DIAGNOSIS — R51 Headache: Secondary | ICD-10-CM | POA: Diagnosis not present

## 2018-12-21 DIAGNOSIS — Z7984 Long term (current) use of oral hypoglycemic drugs: Secondary | ICD-10-CM | POA: Diagnosis not present

## 2018-12-21 DIAGNOSIS — F17210 Nicotine dependence, cigarettes, uncomplicated: Secondary | ICD-10-CM | POA: Insufficient documentation

## 2018-12-21 DIAGNOSIS — N390 Urinary tract infection, site not specified: Secondary | ICD-10-CM | POA: Diagnosis not present

## 2018-12-21 DIAGNOSIS — I10 Essential (primary) hypertension: Secondary | ICD-10-CM | POA: Diagnosis not present

## 2018-12-21 DIAGNOSIS — E119 Type 2 diabetes mellitus without complications: Secondary | ICD-10-CM | POA: Diagnosis not present

## 2018-12-21 DIAGNOSIS — R109 Unspecified abdominal pain: Secondary | ICD-10-CM

## 2018-12-21 DIAGNOSIS — R319 Hematuria, unspecified: Secondary | ICD-10-CM | POA: Diagnosis not present

## 2018-12-21 DIAGNOSIS — I700 Atherosclerosis of aorta: Secondary | ICD-10-CM | POA: Diagnosis not present

## 2018-12-21 DIAGNOSIS — R102 Pelvic and perineal pain: Secondary | ICD-10-CM | POA: Diagnosis present

## 2018-12-21 DIAGNOSIS — Z79899 Other long term (current) drug therapy: Secondary | ICD-10-CM | POA: Diagnosis not present

## 2018-12-21 DIAGNOSIS — R1030 Lower abdominal pain, unspecified: Secondary | ICD-10-CM | POA: Diagnosis not present

## 2018-12-21 DIAGNOSIS — R519 Headache, unspecified: Secondary | ICD-10-CM

## 2018-12-21 DIAGNOSIS — J449 Chronic obstructive pulmonary disease, unspecified: Secondary | ICD-10-CM | POA: Diagnosis not present

## 2018-12-21 DIAGNOSIS — I714 Abdominal aortic aneurysm, without rupture: Secondary | ICD-10-CM | POA: Diagnosis not present

## 2018-12-21 DIAGNOSIS — Z1159 Encounter for screening for other viral diseases: Secondary | ICD-10-CM | POA: Diagnosis not present

## 2018-12-21 LAB — CBC WITH DIFFERENTIAL/PLATELET
Abs Immature Granulocytes: 0.06 10*3/uL (ref 0.00–0.07)
Basophils Absolute: 0 10*3/uL (ref 0.0–0.1)
Basophils Relative: 0 %
Eosinophils Absolute: 0.2 10*3/uL (ref 0.0–0.5)
Eosinophils Relative: 1 %
HCT: 50.8 % — ABNORMAL HIGH (ref 36.0–46.0)
Hemoglobin: 16.4 g/dL — ABNORMAL HIGH (ref 12.0–15.0)
Immature Granulocytes: 0 %
Lymphocytes Relative: 17 %
Lymphs Abs: 2.7 10*3/uL (ref 0.7–4.0)
MCH: 29.7 pg (ref 26.0–34.0)
MCHC: 32.3 g/dL (ref 30.0–36.0)
MCV: 91.9 fL (ref 80.0–100.0)
Monocytes Absolute: 1.3 10*3/uL — ABNORMAL HIGH (ref 0.1–1.0)
Monocytes Relative: 8 %
Neutro Abs: 11.8 10*3/uL — ABNORMAL HIGH (ref 1.7–7.7)
Neutrophils Relative %: 74 %
Platelets: 335 10*3/uL (ref 150–400)
RBC: 5.53 MIL/uL — ABNORMAL HIGH (ref 3.87–5.11)
RDW: 14.9 % (ref 11.5–15.5)
WBC: 16.1 10*3/uL — ABNORMAL HIGH (ref 4.0–10.5)
nRBC: 0 % (ref 0.0–0.2)

## 2018-12-21 LAB — URINALYSIS, ROUTINE W REFLEX MICROSCOPIC
Bacteria, UA: NONE SEEN
Bilirubin Urine: NEGATIVE
Glucose, UA: NEGATIVE mg/dL
Ketones, ur: NEGATIVE mg/dL
Nitrite: NEGATIVE
Protein, ur: 30 mg/dL — AB
Specific Gravity, Urine: 1.004 — ABNORMAL LOW (ref 1.005–1.030)
pH: 6 (ref 5.0–8.0)

## 2018-12-21 LAB — COMPREHENSIVE METABOLIC PANEL
ALT: 23 U/L (ref 0–44)
AST: 21 U/L (ref 15–41)
Albumin: 4 g/dL (ref 3.5–5.0)
Alkaline Phosphatase: 94 U/L (ref 38–126)
Anion gap: 15 (ref 5–15)
BUN: 10 mg/dL (ref 6–20)
CO2: 25 mmol/L (ref 22–32)
Calcium: 10.1 mg/dL (ref 8.9–10.3)
Chloride: 100 mmol/L (ref 98–111)
Creatinine, Ser: 0.79 mg/dL (ref 0.44–1.00)
GFR calc Af Amer: >60 mL/min (ref >60)
GFR calc non Af Amer: >60 mL/min (ref >60)
Glucose, Bld: 130 mg/dL — ABNORMAL HIGH (ref 70–99)
Potassium: 3.3 mmol/L — ABNORMAL LOW (ref 3.5–5.1)
Sodium: 140 mmol/L (ref 135–145)
Total Bilirubin: 0.3 mg/dL (ref 0.3–1.2)
Total Protein: 7.8 g/dL (ref 6.5–8.1)

## 2018-12-21 LAB — SARS CORONAVIRUS 2 BY RT PCR (HOSPITAL ORDER, PERFORMED IN ~~LOC~~ HOSPITAL LAB): SARS Coronavirus 2: NEGATIVE

## 2018-12-21 LAB — LIPASE, BLOOD: Lipase: 141 U/L — ABNORMAL HIGH (ref 11–51)

## 2018-12-21 MED ORDER — HALOPERIDOL LACTATE 5 MG/ML IJ SOLN
2.0000 mg | Freq: Once | INTRAMUSCULAR | Status: AC
  Administered 2018-12-21: 16:00:00 2 mg via INTRAVENOUS
  Filled 2018-12-21: qty 1

## 2018-12-21 MED ORDER — LACTATED RINGERS IV BOLUS
1000.0000 mL | Freq: Once | INTRAVENOUS | Status: AC
  Administered 2018-12-21: 14:00:00 1000 mL via INTRAVENOUS

## 2018-12-21 MED ORDER — ONDANSETRON HCL 4 MG/2ML IJ SOLN
4.0000 mg | Freq: Once | INTRAMUSCULAR | Status: AC
  Administered 2018-12-21: 4 mg via INTRAVENOUS
  Filled 2018-12-21: qty 2

## 2018-12-21 MED ORDER — MORPHINE SULFATE (PF) 4 MG/ML IV SOLN
6.0000 mg | Freq: Once | INTRAVENOUS | Status: AC
  Administered 2018-12-21: 6 mg via INTRAVENOUS
  Filled 2018-12-21: qty 2

## 2018-12-21 MED ORDER — SULFAMETHOXAZOLE-TRIMETHOPRIM 800-160 MG PO TABS
1.0000 | ORAL_TABLET | Freq: Once | ORAL | Status: AC
  Administered 2018-12-21: 1 via ORAL
  Filled 2018-12-21: qty 1

## 2018-12-21 MED ORDER — HYDROMORPHONE HCL 1 MG/ML IJ SOLN
0.5000 mg | Freq: Once | INTRAMUSCULAR | Status: AC
  Administered 2018-12-21: 0.5 mg via INTRAVENOUS
  Filled 2018-12-21: qty 1

## 2018-12-21 MED ORDER — HYDROCODONE-ACETAMINOPHEN 5-325 MG PO TABS: 2.0000 | ORAL_TABLET | Freq: Four times a day (QID) | ORAL | 0 refills | Status: DC | PRN

## 2018-12-21 MED ORDER — SULFAMETHOXAZOLE-TRIMETHOPRIM 800-160 MG PO TABS: 1.0000 | ORAL_TABLET | Freq: Two times a day (BID) | ORAL | 0 refills | Status: AC

## 2018-12-21 NOTE — Progress Notes (Signed)
Telephone visit  Subjective: CC: ?UTI PCP: Dettinger, Fransisca Kaufmann, MD MLY:[id number] Darene Lamer Margaret Vaughn is a 58 y.o. female calls for telephone consult today. Patient provides verbal consent for consult held via phone.  Location of patient: home Location of provider: [facility] Others present for call: none  1. Urinary symptoms Patient reports she thought she was getting a UTI about 1 month ago.  She took Keflex 500mg  BID x1 week.  She feels that is has progressively gotten worse over the last several days.  She reports urinary frequency, urgency, hematuria, fevers, chills.  She reports abdominal pain, nausea and vomiting.  She has chronic back pain but pain has been worse on the left side.  Denies vaginal discharge.  She is reporting decreased urine output over the last day.  Patient reports a h/o frequent or recurrent UTIs and renal stones.  ROS: Per HPI  Allergies  Allergen Reactions  . Iohexol Anaphylaxis    PT.GIVEN OMNI 300 FOR CT ANGIO WAS GIVEN 50 MG.BENADRYL PRIOR TO SCAN HAD NO PROBLEMS   . Moxifloxacin Anaphylaxis  . Shellfish Allergy Hives   Past Medical History:  Diagnosis Date  . Asthma   . Bronchitis   . Chronic pain syndrome   . Depression with anxiety   . Diabetes mellitus without complication ([place])   . Fatigue   . H/O blood clots   . Headache(784.0)   . HTN (hypertension)   . Hyperlipidemia   . Hypoxemia   . Insomnia   . Memory loss   . Myalgia and myositis   . Narcolepsy   . OSA (obstructive sleep apnea)   . Sleeping difficulty   . 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 Tremor   . Ulcer disease     Current Outpatient Medications:  .  cephALEXin (KEFLEX) 500 MG capsule, Take 1 capsule (500 mg total) by mouth 2 (two) times daily., Disp: 14 capsule, Rfl: 0 .  Cyanocobalamin (VITAMIN B-12) 5000 MCG TBDP, Take 5,000 mcg by mouth daily., Disp: , Rfl:  .  enalapril (VASOTEC) 5 MG tablet, Take 1 tablet (5 mg total) by mouth daily., Disp: 90 tablet, Rfl: 3 .  esomeprazole (NEXIUM) 40 MG  capsule, Take 1 capsule (40 mg total) by mouth daily., Disp: 90 capsule, Rfl: 3 .  fluticasone (FLONASE) 50 MCG/ACT nasal spray, Place 1 spray into both nostrils 2 (two) times daily as needed for allergies or rhinitis., Disp: 16 g, Rfl: 6 .  Fluticasone-Salmeterol (ADVAIR) 100-50 MCG/DOSE AEPB, Inhale 1 puff into the lungs 2 (two) times daily., Disp: 1 each, Rfl: 3 .  Melatonin 300 MCG TABS, Take 1 tablet by mouth at bedtime., Disp: , Rfl:  .  Multiple Vitamins-Minerals (HAIR SKIN NAILS PO), Take 3 tablets by mouth daily., Disp: , Rfl:  .  Multiple Vitamins-Minerals (MULTIVITAMIN WITH MINERALS) tablet, Take 1 tablet by mouth daily., Disp: , Rfl:  .  nystatin (MYCOSTATIN) 100000 UNIT/ML suspension, Take 5 mLs (500,000 Units total) by mouth 4 (four) times daily. (Patient not taking: Reported on 11/25/2018), Disp: 60 mL, Rfl: 0 .  Omega-3 Fatty Acids (FISH OIL) 1000 MG CAPS, Take 2 capsules by mouth at bedtime., Disp: , Rfl:  .  oxyCODONE-acetaminophen (PERCOCET) 10-325 MG tablet, Take 1 tablet by mouth every 8 (eight) hours as needed for pain., Disp: , Rfl:  .  pregabalin (LYRICA) 150 MG capsule, Take 150 mg by mouth daily., Disp: , Rfl:  .  PROAIR HFA 108 (90 Base) MCG/ACT inhaler, TAKE 2 PUFFS BY MOUTH EVERY 6 HOURS  AS NEEDED FOR WHEEZE OR SHORTNESS OF BREATH, Disp: 18 Inhaler, Rfl: 1 .  Probiotic Product (PROBIOTIC-10) CAPS, Take 1 tablet by mouth daily., Disp: , Rfl:  .  rosuvastatin (CRESTOR) 20 MG tablet, Take 1 tablet (20 mg total) by mouth daily., Disp: 90 tablet, Rfl: 3 .  sitaGLIPtin-metformin (JANUMET) 50-1000 MG tablet, Take 1 tablet by mouth 2 (two) times daily with a meal., Disp: 180 tablet, Rfl: 3 .  venlafaxine XR (EFFEXOR-XR) 150 MG 24 hr capsule, TAKE 1 CAPSULE (150 MG TOTAL) BY MOUTH DAILY. (TOTAL 225 MG DAILY), Disp: 90 capsule, Rfl: 0 .  venlafaxine XR (EFFEXOR-XR) 75 MG 24 hr capsule, 225 mg daily (150 mg + 75 mg daily), Disp: 30 capsule, Rfl: 0 .  VITAMIN D, ERGOCALCIFEROL, PO,  Take 1 capsule by mouth daily., Disp: , Rfl:   Assessment/ Plan: 58 y.o. female   1. Hematuria, unspecified type I am very concerned for pyelonephritis with kidney injury in this patient.  We obtain a urine sample but it could not be run because of the amount of sediment and cloudiness in it.  I have gone ahead and sent this for urine culture but after further discussion with the patient I am concerned that she may becoming severely ill related to this infection.  I have advised her to go to the emergency department.  She will likely need stat CBC, BMP and imaging of the kidney to rule out renal stone/hydronephrosis/kidney injury.  We discussed the urgency needed.  Of note, she is allergic to fluoroquinolones and has failed oral cephalosporins.  Will likely need at minimum a short admission for IV antibiotics.  She will go to any [facility] and their emergency department has been called.  Information provided to the triage nurse. - Urinalysis, Complete - Urine Culture - CBC - Basic Metabolic Panel  2. Flank pain As above - CBC - Basic Metabolic Panel   Start time: 11:33am End time: 11:47am  Total time spent on patient care (including telephone call/ virtual visit): 22 minutes  [place], [place] [phone]

## 2018-12-21 NOTE — ED Triage Notes (Signed)
Pt was sent from [facility] for uti possible kidney failure and she is having abd pain with nausea and vomiting

## 2018-12-22 LAB — URINE CULTURE: Culture: <10000 — AB

## 2018-12-22 LAB — BASIC METABOLIC PANEL
BUN/Creatinine Ratio: 10 (ref 9–23)
BUN: 10 mg/dL (ref 6–24)
CO2: 21 mmol/L (ref 20–29)
Calcium: 10.3 mg/dL — ABNORMAL HIGH (ref 8.7–10.2)
Chloride: 97 mmol/L (ref 96–106)
Creatinine, Ser: 1.02 mg/dL — ABNORMAL HIGH (ref 0.57–1.00)
GFR calc Af Amer: 70 mL/min/{1.73_m2} (ref >59)
GFR calc non Af Amer: 61 mL/min/{1.73_m2} (ref >59)
Glucose: 187 mg/dL — ABNORMAL HIGH (ref 65–99)
Potassium: 3.4 mmol/L — ABNORMAL LOW (ref 3.5–5.2)
Sodium: 138 mmol/L (ref 134–144)

## 2018-12-22 LAB — CBC
Hematocrit: 47.4 % — ABNORMAL HIGH (ref 34.0–46.6)
Hemoglobin: 16 g/dL — ABNORMAL HIGH (ref 11.1–15.9)
MCH: 30.6 pg (ref 26.6–33.0)
MCHC: 33.8 g/dL (ref 31.5–35.7)
MCV: 91 fL (ref 79–97)
Platelets: 363 10*3/uL (ref 150–450)
RBC: 5.23 x10E6/uL (ref 3.77–5.28)
RDW: 14.2 % (ref 11.7–15.4)
WBC: 16.3 10*3/uL — ABNORMAL HIGH (ref 3.4–10.8)

## 2018-12-22 NOTE — ED Provider Notes (Signed)
[facility] EMERGENCY DEPARTMENT Provider Note   CSN: [id number] Arrival date & time: 12/21/18  1252    History   Chief Complaint Chief Complaint  Patient presents with  . Urinary Tract Infection  . Abdominal Pain    HPI Margaret Vaughn is a 58 y.o. female.  HPI   16yF with abdominal pain. Ongoing for over a month. Prescribed keflex for UTI initially and had some improvement. Feels like symptoms worsened in the past fews days. +n/v. +dysuria and hematuria. Subjective fever. No sick contacts. Past kx of kidney stones.   Past Medical History:  Diagnosis Date  . Asthma   . Bronchitis   . Chronic pain syndrome   . Depression with anxiety   . Diabetes mellitus without complication ([place])   . Fatigue   . H/O blood clots   . Headache(784.0)   . HTN (hypertension)   . Hyperlipidemia   . Hypoxemia   . Insomnia   . Memory loss   . Myalgia and myositis   . Narcolepsy   . OSA (obstructive sleep apnea)   . Sleeping difficulty   . 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 Tremor   . Ulcer disease     Patient Active Problem List   Diagnosis Date Noted  . Overweight 03/11/2018  . COPD (chronic obstructive pulmonary disease) ([place]) 03/11/2018  . Type 2 diabetes mellitus with other specified complication ([place]) 21/30/8657  . PTSD (post-traumatic stress disorder) 03/11/2018  . GAD (generalized anxiety disorder) 01/30/2014  . Essential hypertension, benign 01/30/2014  . Hyperlipidemia 08/10/2012  . Tobacco abuse 08/10/2012  . GERD 12/24/2009    Past Surgical History:  Procedure Laterality Date  . ABDOMINAL HYSTERECTOMY    . CHOLECYSTECTOMY       OB History   No obstetric history on file.      Home Medications    Prior to Admission medications   Medication Sig Start Date End Date Taking? Authorizing Provider  cephALEXin (KEFLEX) 500 MG capsule Take 1 capsule (500 mg total) by mouth 2 (two) times daily. 11/25/18   Evelina Dun A, FNP  Cyanocobalamin (VITAMIN B-12) 5000 MCG TBDP  Take 5,000 mcg by mouth daily.    [provider]  enalapril (VASOTEC) 5 MG tablet Take 1 tablet (5 mg total) by mouth daily. 09/27/18   Dettinger, Fransisca Kaufmann, MD  esomeprazole (NEXIUM) 40 MG capsule Take 1 capsule (40 mg total) by mouth daily. 03/11/18   Dettinger, Fransisca Kaufmann, MD  fluticasone (FLONASE) 50 MCG/ACT nasal spray Place 1 spray into both nostrils 2 (two) times daily as needed for allergies or rhinitis. 06/27/18   Dettinger, Fransisca Kaufmann, MD  Fluticasone-Salmeterol (ADVAIR) 100-50 MCG/DOSE AEPB Inhale 1 puff into the lungs 2 (two) times daily. 04/20/18   Dettinger, Fransisca Kaufmann, MD  HYDROcodone-acetaminophen (NORCO/VICODIN) 5-325 MG tablet Take 2 tablets by mouth every 6 (six) hours as needed. 12/21/18   Virgel Manifold, MD  Melatonin 300 MCG TABS Take 1 tablet by mouth at bedtime.    [provider]  Multiple Vitamins-Minerals (HAIR SKIN NAILS PO) Take 3 tablets by mouth daily.    [provider]  Multiple Vitamins-Minerals (MULTIVITAMIN WITH MINERALS) tablet Take 1 tablet by mouth daily.    [provider]  nystatin (MYCOSTATIN) 100000 UNIT/ML suspension Take 5 mLs (500,000 Units total) by mouth 4 (four) times daily. Patient not taking: Reported on 11/25/2018 04/14/18   Dettinger, Fransisca Kaufmann, MD  Omega-3 Fatty Acids (FISH OIL) 1000 MG CAPS Take 2 capsules by mouth  at bedtime.    [provider]  oxyCODONE-acetaminophen (PERCOCET) 10-325 MG tablet Take 1 tablet by mouth every 8 (eight) hours as needed for pain.    [provider]  pregabalin (LYRICA) 150 MG capsule Take 150 mg by mouth daily.    [provider]  PROAIR HFA 108 (90 Base) MCG/ACT inhaler TAKE 2 PUFFS BY MOUTH EVERY 6 HOURS AS NEEDED FOR WHEEZE OR SHORTNESS OF BREATH 11/11/18   Dettinger, Fransisca Kaufmann, MD  Probiotic Product (PROBIOTIC-10) CAPS Take 1 tablet by mouth daily.    [provider]  rosuvastatin (CRESTOR) 20 MG tablet Take 1 tablet (20 mg total) by mouth daily. 04/14/18    Dettinger, Fransisca Kaufmann, MD  sitaGLIPtin-metformin (JANUMET) 50-1000 MG tablet Take 1 tablet by mouth 2 (two) times daily with a meal. 09/27/18   Dettinger, Fransisca Kaufmann, MD  sulfamethoxazole-trimethoprim (BACTRIM DS) 800-160 MG tablet Take 1 tablet by mouth 2 (two) times daily for 7 days. 12/21/18 12/28/18  Virgel Manifold, MD  venlafaxine XR (EFFEXOR-XR) 150 MG 24 hr capsule TAKE 1 CAPSULE (150 MG TOTAL) BY MOUTH DAILY. (TOTAL 225 MG DAILY) 12/16/18   Dettinger, Fransisca Kaufmann, MD  venlafaxine XR (EFFEXOR-XR) 75 MG 24 hr capsule 225 mg daily (150 mg + 75 mg daily) 06/14/18   Hisada, Elie Goody, MD  VITAMIN D, ERGOCALCIFEROL, PO Take 1 capsule by mouth daily.    [provider]    Family History Family History  Problem Relation Age of Onset  . Ulcers Mother   . Sleep apnea Mother   . Migraines Mother   . Cancer Mother        uterine  . Arthritis Mother   . Hypertension Mother   . Heart disease Mother   . Depression Mother   . Arrhythmia Son     Social History Social History   Tobacco Use  . Smoking status: Current Every Day Smoker    Packs/day: 2.00    Years: 30.00    Pack years: 60.00    Types: Cigarettes  . Smokeless tobacco: Never Used  Substance Use Topics  . Alcohol use: No  . Drug use: No     Allergies   Iohexol; Moxifloxacin; and Shellfish allergy   Review of Systems Review of Systems  All systems reviewed and negative, other than as noted in HPI.  Physical Exam Updated Vital Signs BP 114/74   Pulse 96   Temp 98.1 F (36.7 C) (Oral)   Resp 16   Ht 5\' 4"  (1.626 m)   Wt 74.8 kg   SpO2 95%   BMI 28.32 kg/m   Physical Exam Vitals signs and nursing note reviewed.  Constitutional:      General: She is not in acute distress.    Appearance: She is well-developed.  HENT:     Head: Normocephalic and atraumatic.  Eyes:     General:        Right eye: No discharge.        Left eye: No discharge.     Conjunctiva/sclera: Conjunctivae normal.  Neck:      Musculoskeletal: Neck supple.  Cardiovascular:     Rate and Rhythm: Normal rate and regular rhythm.     Heart sounds: Normal heart sounds. No murmur. No friction rub. No gallop.   Pulmonary:     Effort: Pulmonary effort is normal. No respiratory distress.     Breath sounds: Normal breath sounds.  Abdominal:     General: There is no distension.  Palpations: Abdomen is soft.     Tenderness: There is abdominal tenderness in the suprapubic area. There is no guarding or rebound.  Musculoskeletal:        General: No tenderness.  Skin:    General: Skin is warm and dry.  Neurological:     Mental Status: She is alert.  Psychiatric:        Behavior: Behavior normal.        Thought Content: Thought content normal.      ED Treatments / Results  Labs (all labs ordered are listed, but only abnormal results are displayed) Labs Reviewed  COMPREHENSIVE METABOLIC PANEL - Abnormal; Notable for the following components:      Result Value   Potassium 3.3 (*)    Glucose, Bld 130 (*)    All other components within normal limits  LIPASE, BLOOD - Abnormal; Notable for the following components:   Lipase 141 (*)    All other components within normal limits  URINALYSIS, ROUTINE W REFLEX MICROSCOPIC - Abnormal; Notable for the following components:   Specific Gravity, Urine 1.004 (*)    Hgb urine dipstick LARGE (*)    Protein, ur 30 (*)    Leukocytes,Ua SMALL (*)    All other components within normal limits  CBC WITH DIFFERENTIAL/PLATELET - Abnormal; Notable for the following components:   WBC 16.1 (*)    RBC 5.53 (*)    Hemoglobin 16.4 (*)    HCT 50.8 (*)    Neutro Abs 11.8 (*)    Monocytes Absolute 1.3 (*)    All other components within normal limits  SARS CORONAVIRUS 2 (HOSPITAL ORDER, [place] [facility])  URINE CULTURE    EKG None  Radiology Ct Head Wo Contrast  Result Date: 12/21/2018 CLINICAL DATA:  Headache. EXAM: CT HEAD WITHOUT CONTRAST TECHNIQUE:  Contiguous axial images were obtained from the base of the skull through the vertex without intravenous contrast. COMPARISON:  CT scan of May 01, 2011. FINDINGS: Brain: No evidence of acute infarction, hemorrhage, hydrocephalus, extra-axial collection or mass lesion/mass effect. Vascular: No hyperdense vessel or unexpected calcification. Skull: Normal. Negative for fracture or focal lesion. Sinuses/Orbits: No acute finding. Other: None. IMPRESSION: Normal head CT. Electronically Signed   By: Marijo Conception M.D.   On: 12/21/2018 16:04   Ct Renal Stone Study  Result Date: 12/21/2018 CLINICAL DATA:  Worsening UTI. EXAM: CT ABDOMEN AND PELVIS WITHOUT CONTRAST TECHNIQUE: Multidetector CT imaging of the abdomen and pelvis was performed following the standard protocol without IV contrast. COMPARISON:  CT dated 04/19/2013. FINDINGS: Lower chest: No acute abnormality. Hepatobiliary: No focal liver abnormality is seen. Status post cholecystectomy. The common bile duct is somewhat dilated which is stable from prior study. Pancreas: Unremarkable. No pancreatic ductal dilatation or surrounding inflammatory changes. Spleen: Normal in size without focal abnormality. Adrenals/Urinary Tract: Adrenal glands are unremarkable. Kidneys are normal, without renal calculi, focal lesion, or hydronephrosis. Bladder is unremarkable. Stomach/Bowel: Stomach is within normal limits. Appendix appears normal. No evidence of bowel wall thickening, distention, or inflammatory changes. Vascular/Lymphatic: There is an infrarenal abdominal aortic aneurysm measuring approximately 3.3 cm. There are no pathologically enlarged retroperitoneal lymph nodes. Aortic calcifications are noted. Again noted are stable prominent mesenteric lymph nodes in the low abdomen. Reproductive: Status post hysterectomy. No adnexal masses. Other: No abdominal wall hernia or abnormality. No abdominopelvic ascites. Musculoskeletal: No acute or significant osseous  findings. IMPRESSION: 1. No acute intra-abdominal abnormality detected. There is no hydronephrosis. There are no  radiopaque obstructing kidney stones. 2. Normal appendix in the right lower quadrant. 3. Status post cholecystectomy. 4. 3.3 cm infrarenal abdominal aortic aneurysm. Recommend followup by ultrasound in 3 years. This recommendation follows ACR consensus guidelines: White Paper of the ACR Incidental Findings Committee II on Vascular Findings. J Am Coll Radiol 2013; 10:789-794. Aortic aneurysm NOS (ICD10-I71.9) Aortic Atherosclerosis (ICD10-I70.0). Electronically Signed   By: Constance Holster M.D.   On: 12/21/2018 14:54    Procedures Procedures (including critical care time)  Medications Ordered in ED Medications  lactated ringers bolus 1,000 mL (0 mLs Intravenous Stopped 12/21/18 1500)  morphine 4 MG/ML injection 6 mg (6 mg Intravenous Given 12/21/18 1352)  ondansetron (ZOFRAN) injection 4 mg (4 mg Intravenous Given 12/21/18 1352)  sulfamethoxazole-trimethoprim (BACTRIM DS) 800-160 MG per tablet 1 tablet (1 tablet Oral Given 12/21/18 1546)  HYDROmorphone (DILAUDID) injection 0.5 mg (0.5 mg Intravenous Given 12/21/18 1546)  haloperidol lactate (HALDOL) injection 2 mg (2 mg Intravenous Given 12/21/18 1546)     Initial Impression / Assessment and Plan / ED Course  I have reviewed the triage vital signs and the nursing notes.  Pertinent labs & imaging results that were available during my care of the patient were reviewed by me and considered in my medical decision making (see chart for details).     57yF with abdominal pain. Probably from UTI. Small incidentally noted AAA. Routine surveillance. Also c/o acute onset of severe HA while in the ED. Neuro exam nonfocal. Did do a CT head which was negative for acute explaantory pathology. Abx for UTI. PRN pain meds. Return precautions discussed. Outpt FU otherwise.    Final Clinical Impressions(s) / ED Diagnoses   Final diagnoses:  Urinary  tract infection with hematuria, site unspecified  Nonintractable headache, unspecified chronicity pattern, unspecified headache type    ED Discharge Orders         Ordered    sulfamethoxazole-trimethoprim (BACTRIM DS) 800-160 MG tablet  2 times daily     12/21/18 1632    HYDROcodone-acetaminophen (NORCO/VICODIN) 5-325 MG tablet  Every 6 hours PRN     12/21/18 1632           Virgel Manifold, MD 12/22/18 1617

## 2018-12-23 ENCOUNTER — Other Ambulatory Visit: Payer: Self-pay | Admitting: Physician Assistant

## 2018-12-23 LAB — URINE CULTURE

## 2018-12-23 MED ORDER — CIPROFLOXACIN HCL 500 MG PO TABS: 500.0000 mg | ORAL_TABLET | Freq: Two times a day (BID) | ORAL | 0 refills | Status: DC

## 2018-12-23 MED ORDER — PENICILLIN V POTASSIUM 500 MG PO TABS: 500.0000 mg | ORAL_TABLET | Freq: Four times a day (QID) | ORAL | 1 refills | Status: DC

## 2018-12-23 NOTE — Progress Notes (Signed)
a 

## 2018-12-23 NOTE — Progress Notes (Signed)
cipro

## 2018-12-26 DIAGNOSIS — R0902 Hypoxemia: Secondary | ICD-10-CM | POA: Diagnosis not present

## 2018-12-28 ENCOUNTER — Encounter: Payer: Self-pay | Admitting: Family Medicine

## 2018-12-28 ENCOUNTER — Ambulatory Visit (INDEPENDENT_AMBULATORY_CARE_PROVIDER_SITE_OTHER): Payer: Medicaid Other | Admitting: Family Medicine

## 2018-12-28 ENCOUNTER — Other Ambulatory Visit: Payer: Self-pay

## 2018-12-28 DIAGNOSIS — E1169 Type 2 diabetes mellitus with other specified complication: Secondary | ICD-10-CM

## 2018-12-28 DIAGNOSIS — E1149 Type 2 diabetes mellitus with other diabetic neurological complication: Secondary | ICD-10-CM | POA: Diagnosis not present

## 2018-12-28 DIAGNOSIS — E782 Mixed hyperlipidemia: Secondary | ICD-10-CM

## 2018-12-28 DIAGNOSIS — I10 Essential (primary) hypertension: Secondary | ICD-10-CM

## 2018-12-28 MED ORDER — PREGABALIN 150 MG PO CAPS: 150.0000 mg | ORAL_CAPSULE | Freq: Three times a day (TID) | ORAL | 2 refills | Status: DC

## 2018-12-28 NOTE — Progress Notes (Signed)
Virtual Visit via telephone Note  I connected with Margaret Vaughn on 12/28/18 at 1600 by telephone and verified that I am speaking with the correct person using two identifiers. Margaret Vaughn is currently located at home and no other people are currently with her during visit. The provider, Fransisca Kaufmann Townes Fuhs, MD is located in their office at time of visit.  Call ended at 1616  I discussed the limitations, risks, security and privacy concerns of performing an evaluation and management service by telephone and the availability of in person appointments. I also discussed with the patient that there may be a patient responsible charge related to this service. The patient expressed understanding and agreed to proceed.   History and Present Illness: She had bloody urine and was seen in ed and given antibiotics and has not been have issues since that time. She is currently taking penVk and feels like her symptoms are improving. Patient was discharged from ED 12/21/2018  Type 2 diabetes mellitus Patient comes in today for recheck of his diabetes. Patient has been currently taking Janumet. Patient is not currently on an ACE inhibitor/ARB. Patient has not seen an ophthalmologist this year. Patient denies any issues with their feet. 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  Hypertension Patient is currently on enalapril, and their blood pressure today is unknown because she does not have a blood pressure cuff today at home. Patient denies any lightheadedness or dizziness. Patient denies headaches, blurred vision, chest pains, shortness of breath, or weakness. Denies any side effects from medication and is content with current medication.   Hyperlipidemia Patient is coming in for recheck of his hyperlipidemia. The patient is currently taking Crestor. They deny any issues with myalgias or history of liver damage from it. They deny any focal numbness or weakness or chest pain.   No diagnosis found.   Outpatient Encounter Medications as of 12/28/2018  Medication Sig  . cephALEXin (KEFLEX) 500 MG capsule Take 1 capsule (500 mg total) by mouth 2 (two) times daily.  . ciprofloxacin (CIPRO) 500 MG tablet Take 1 tablet (500 mg total) by mouth 2 (two) times daily.  . Cyanocobalamin (VITAMIN B-12) 5000 MCG TBDP Take 5,000 mcg by mouth daily.  . enalapril (VASOTEC) 5 MG tablet Take 1 tablet (5 mg total) by mouth daily.  Marland Kitchen esomeprazole (NEXIUM) 40 MG capsule Take 1 capsule (40 mg total) by mouth daily.  . fluticasone (FLONASE) 50 MCG/ACT nasal spray Place 1 spray into both nostrils 2 (two) times daily as needed for allergies or rhinitis.  . Fluticasone-Salmeterol (ADVAIR) 100-50 MCG/DOSE AEPB Inhale 1 puff into the lungs 2 (two) times daily.  Marland Kitchen HYDROcodone-acetaminophen (NORCO/VICODIN) 5-325 MG tablet Take 2 tablets by mouth every 6 (six) hours as needed.  . Melatonin 300 MCG TABS Take 1 tablet by mouth at bedtime.  . Multiple Vitamins-Minerals (HAIR SKIN NAILS PO) Take 3 tablets by mouth daily.  . Multiple Vitamins-Minerals (MULTIVITAMIN WITH MINERALS) tablet Take 1 tablet by mouth daily.  Marland Kitchen nystatin (MYCOSTATIN) 100000 UNIT/ML suspension Take 5 mLs (500,000 Units total) by mouth 4 (four) times daily. (Patient not taking: Reported on 11/25/2018)  . Omega-3 Fatty Acids (FISH OIL) 1000 MG CAPS Take 2 capsules by mouth at bedtime.  Marland Kitchen oxyCODONE-acetaminophen (PERCOCET) 10-325 MG tablet Take 1 tablet by mouth every 8 (eight) hours as needed for pain.  Marland Kitchen penicillin v potassium (VEETID) 500 MG tablet Take 1 tablet (500 mg total) by mouth 4 (four) times daily.  . pregabalin (LYRICA) 150  MG capsule Take 150 mg by mouth daily.  Marland Kitchen PROAIR HFA 108 (90 Base) MCG/ACT inhaler TAKE 2 PUFFS BY MOUTH EVERY 6 HOURS AS NEEDED FOR WHEEZE OR SHORTNESS OF BREATH  . Probiotic Product (PROBIOTIC-10) CAPS Take 1 tablet by mouth daily.  . rosuvastatin (CRESTOR) 20 MG tablet Take 1 tablet (20 mg total) by mouth daily.  .  sitaGLIPtin-metformin (JANUMET) 50-1000 MG tablet Take 1 tablet by mouth 2 (two) times daily with a meal.  . sulfamethoxazole-trimethoprim (BACTRIM DS) 800-160 MG tablet Take 1 tablet by mouth 2 (two) times daily for 7 days.  Marland Kitchen venlafaxine XR (EFFEXOR-XR) 150 MG 24 hr capsule TAKE 1 CAPSULE (150 MG TOTAL) BY MOUTH DAILY. (TOTAL 225 MG DAILY)  . venlafaxine XR (EFFEXOR-XR) 75 MG 24 hr capsule 225 mg daily (150 mg + 75 mg daily)  . VITAMIN D, ERGOCALCIFEROL, PO Take 1 capsule by mouth daily.   No facility-administered encounter medications on file as of 12/28/2018.     Review of Systems  Constitutional: Negative for chills and fever.  HENT: Negative for congestion, ear discharge and ear pain.   Eyes: Negative for redness and visual disturbance.  Respiratory: Negative for chest tightness and shortness of breath.   Cardiovascular: Negative for chest pain and leg swelling.  Genitourinary: Negative for difficulty urinating and dysuria.  Musculoskeletal: Negative for back pain and gait problem.  Skin: Negative for rash.  Neurological: Positive for numbness (Her neuropathy is worsened because she ran out of her Lyrica that is why she is calling in today). Negative for light-headedness and headaches.  Psychiatric/Behavioral: Negative for agitation and behavioral problems.  All other systems reviewed and are negative.   Observations/Objective: Patient sounds comfortable and in no acute distress  Assessment and Plan: Problem List Items Addressed This Visit      Cardiovascular and Mediastinum   Essential hypertension, benign     Endocrine   Type 2 diabetes mellitus with other specified complication ([place]) - Primary   Relevant Medications   pregabalin (LYRICA) 150 MG capsule     Other   Hyperlipidemia    Other Visit Diagnoses    Type 2 diabetes mellitus with neurological complications ([facility])       Relevant Medications   pregabalin (LYRICA) 150 MG capsule       Follow Up Instructions: 3  month recheck    I discussed the assessment and treatment plan with the patient. The patient was provided an opportunity to ask questions and all were answered. The patient agreed with the plan and demonstrated an understanding of the instructions.   The patient was advised to call back or seek an in-person evaluation if the symptoms worsen or if the condition fails to improve as anticipated.  The above assessment and management plan was discussed with the patient. The patient verbalized understanding of and has agreed to the management plan. Patient is aware to call the clinic if symptoms persist or worsen. Patient is aware when to return to the clinic for a follow-up visit. Patient educated on when it is appropriate to go to the emergency department.    I provided 16 minutes of non-face-to-face time during this encounter.    [name], MD

## 2019-01-03 ENCOUNTER — Telehealth: Payer: Self-pay

## 2019-01-03 NOTE — Telephone Encounter (Signed)
Patient's Medicaid will not pay for Pregabalin.  Alternatives are:  Duloxetine capsule Gabapentin capsule or tablet

## 2019-01-05 NOTE — Telephone Encounter (Signed)
Patient either needs to be scheduled for a virtual appointment to discuss this or if she knows that she wants to try either the gabapentin or Cymbalta we can go ahead and do that but if she does not or wants to discuss it then she needs to be scheduled for a virtual or telephone appointment

## 2019-01-05 NOTE — Telephone Encounter (Signed)
Pt is scheduled for televisit on 6/12 with dr dettinger

## 2019-01-06 ENCOUNTER — Other Ambulatory Visit: Payer: Self-pay

## 2019-01-06 ENCOUNTER — Ambulatory Visit (INDEPENDENT_AMBULATORY_CARE_PROVIDER_SITE_OTHER): Payer: Medicaid Other | Admitting: Family Medicine

## 2019-01-06 ENCOUNTER — Encounter: Payer: Self-pay | Admitting: Family Medicine

## 2019-01-06 DIAGNOSIS — G629 Polyneuropathy, unspecified: Secondary | ICD-10-CM | POA: Insufficient documentation

## 2019-01-06 DIAGNOSIS — Z79891 Long term (current) use of opiate analgesic: Secondary | ICD-10-CM | POA: Diagnosis not present

## 2019-01-06 DIAGNOSIS — G894 Chronic pain syndrome: Secondary | ICD-10-CM | POA: Diagnosis not present

## 2019-01-06 DIAGNOSIS — M797 Fibromyalgia: Secondary | ICD-10-CM | POA: Insufficient documentation

## 2019-01-06 DIAGNOSIS — E1169 Type 2 diabetes mellitus with other specified complication: Secondary | ICD-10-CM

## 2019-01-06 DIAGNOSIS — M4802 Spinal stenosis, cervical region: Secondary | ICD-10-CM | POA: Diagnosis not present

## 2019-01-06 DIAGNOSIS — E1149 Type 2 diabetes mellitus with other diabetic neurological complication: Secondary | ICD-10-CM | POA: Diagnosis not present

## 2019-01-06 DIAGNOSIS — M5136 Other intervertebral disc degeneration, lumbar region: Secondary | ICD-10-CM | POA: Diagnosis not present

## 2019-01-06 DIAGNOSIS — Z79899 Other long term (current) drug therapy: Secondary | ICD-10-CM | POA: Diagnosis not present

## 2019-01-06 DIAGNOSIS — J441 Chronic obstructive pulmonary disease with (acute) exacerbation: Secondary | ICD-10-CM

## 2019-01-06 DIAGNOSIS — I499 Cardiac arrhythmia, unspecified: Secondary | ICD-10-CM | POA: Diagnosis not present

## 2019-01-06 MED ORDER — PREDNISONE 20 MG PO TABS: ORAL_TABLET | ORAL | 0 refills | Status: DC

## 2019-01-06 MED ORDER — PREGABALIN 150 MG PO CAPS: 150.0000 mg | ORAL_CAPSULE | Freq: Three times a day (TID) | ORAL | 2 refills | Status: DC

## 2019-01-06 MED ORDER — SPIRIVA HANDIHALER 18 MCG IN CAPS: 18.0000 ug | ORAL_CAPSULE | Freq: Every day | RESPIRATORY_TRACT | 12 refills | Status: DC

## 2019-01-06 NOTE — Progress Notes (Signed)
Virtual Visit via telephone Note  I connected with Margaret Vaughn on 01/06/19 at 0846 by telephone and verified that I am speaking with the correct person using two identifiers. Margaret Vaughn is currently located at home and no other people are currently with her during visit. The provider, Fransisca Kaufmann Dettinger, MD is located in their office at time of visit.  Call ended at 0910  I discussed the limitations, risks, security and privacy concerns of performing an evaluation and management service by telephone and the availability of in person appointments. I also discussed with the patient that there may be a patient responsible charge related to this service. The patient expressed understanding and agreed to proceed.   History and Present Illness: Patient has tried and failed gabapentin because of side effects and she can't take cymbalta because she is already on effexor and she has been on lyrica for more than 10 years. She has tried the other options or can't take them.  She takes the lyrica and has been doing bad since she ran out.  Patient has been having increased work of breathing with her copd and she has been using advair but still feels like she is having more issues.  She denies any fevers or chills but does have congestion and trouble breathing. She says she has been gradually.  She says she has been using her home oxygen all the time and has tested her O2 sats and they have been as low as in the 70s and then she will put the oxygen on and felt better.  She is wondering if she can get portable oxygen and we will schedule her for a face-to-face visit via video or in person for this.    Outpatient Encounter Medications as of 01/06/2019  Medication Sig  . Cyanocobalamin (VITAMIN B-12) 5000 MCG TBDP Take 5,000 mcg by mouth daily.  . enalapril (VASOTEC) 5 MG tablet Take 1 tablet (5 mg total) by mouth daily.  Marland Kitchen esomeprazole (NEXIUM) 40 MG capsule Take 1 capsule (40 mg total) by mouth  daily.  . fluticasone (FLONASE) 50 MCG/ACT nasal spray Place 1 spray into both nostrils 2 (two) times daily as needed for allergies or rhinitis.  . Fluticasone-Salmeterol (ADVAIR) 100-50 MCG/DOSE AEPB Inhale 1 puff into the lungs 2 (two) times daily.  Marland Kitchen HYDROcodone-acetaminophen (NORCO/VICODIN) 5-325 MG tablet Take 2 tablets by mouth every 6 (six) hours as needed.  . Melatonin 300 MCG TABS Take 1 tablet by mouth at bedtime.  . Multiple Vitamins-Minerals (HAIR SKIN NAILS PO) Take 3 tablets by mouth daily.  . Multiple Vitamins-Minerals (MULTIVITAMIN WITH MINERALS) tablet Take 1 tablet by mouth daily.  Marland Kitchen nystatin (MYCOSTATIN) 100000 UNIT/ML suspension Take 5 mLs (500,000 Units total) by mouth 4 (four) times daily. (Patient not taking: Reported on 11/25/2018)  . Omega-3 Fatty Acids (FISH OIL) 1000 MG CAPS Take 2 capsules by mouth at bedtime.  Marland Kitchen oxyCODONE-acetaminophen (PERCOCET) 10-325 MG tablet Take 1 tablet by mouth every 8 (eight) hours as needed for pain.  Marland Kitchen penicillin v potassium (VEETID) 500 MG tablet Take 1 tablet (500 mg total) by mouth 4 (four) times daily.  . predniSONE (DELTASONE) 20 MG tablet 2 po at same time daily for 5 days  . pregabalin (LYRICA) 150 MG capsule Take 1 capsule (150 mg total) by mouth 3 (three) times daily.  Marland Kitchen PROAIR HFA 108 (90 Base) MCG/ACT inhaler TAKE 2 PUFFS BY MOUTH EVERY 6 HOURS AS NEEDED FOR WHEEZE OR SHORTNESS OF BREATH  .  Probiotic Product (PROBIOTIC-10) CAPS Take 1 tablet by mouth daily.  . rosuvastatin (CRESTOR) 20 MG tablet Take 1 tablet (20 mg total) by mouth daily.  . sitaGLIPtin-metformin (JANUMET) 50-1000 MG tablet Take 1 tablet by mouth 2 (two) times daily with a meal.  . tiotropium (SPIRIVA HANDIHALER) 18 MCG inhalation capsule Place 1 capsule (18 mcg total) into inhaler and inhale daily.  Marland Kitchen venlafaxine XR (EFFEXOR-XR) 150 MG 24 hr capsule TAKE 1 CAPSULE (150 MG TOTAL) BY MOUTH DAILY. (TOTAL 225 MG DAILY)  . venlafaxine XR (EFFEXOR-XR) 75 MG 24 hr capsule  225 mg daily (150 mg + 75 mg daily)  . VITAMIN D, ERGOCALCIFEROL, PO Take 1 capsule by mouth daily.  . [DISCONTINUED] pregabalin (LYRICA) 150 MG capsule Take 1 capsule (150 mg total) by mouth 3 (three) times daily.   No facility-administered encounter medications on file as of 01/06/2019.     Review of Systems  Constitutional: Negative for chills and fever.  Eyes: Negative for visual disturbance.  Respiratory: Positive for shortness of breath and wheezing. Negative for chest tightness.   Cardiovascular: Negative for chest pain and leg swelling.  Musculoskeletal: Positive for myalgias.  Skin: Negative for rash.  Neurological: Positive for numbness. Negative for light-headedness and headaches.  Psychiatric/Behavioral: Negative for agitation and behavioral problems.  All other systems reviewed and are negative.   Observations/Objective: Patient sounds comfortable and in no acute distress  Assessment and Plan: Problem List Items Addressed This Visit      Endocrine   Type 2 diabetes mellitus with other specified complication ([place])     Nervous and Auditory   Neuropathy   Relevant Medications   pregabalin (LYRICA) 150 MG capsule     Other   Fibromyalgia   Relevant Medications   pregabalin (LYRICA) 150 MG capsule   predniSONE (DELTASONE) 20 MG tablet    Other Visit Diagnoses    COPD exacerbation ([place])    -  Primary   Relevant Medications   tiotropium (SPIRIVA HANDIHALER) 18 MCG inhalation capsule   predniSONE (DELTASONE) 20 MG tablet   Type 2 diabetes mellitus with neurological complications (HCC)       Relevant Medications   pregabalin (LYRICA) 150 MG capsule       Follow Up Instructions: 3 months recheck and refills    I discussed the assessment and treatment plan with the patient. The patient was provided an opportunity to ask questions and all were answered. The patient agreed with the plan and demonstrated an understanding of the instructions.   The patient was  advised to call back or seek an in-person evaluation if the symptoms worsen or if the condition fails to improve as anticipated.  The above assessment and management plan was discussed with the patient. The patient verbalized understanding of and has agreed to the management plan. Patient is aware to call the clinic if symptoms persist or worsen. Patient is aware when to return to the clinic for a follow-up visit. Patient educated on when it is appropriate to go to the emergency department.    I provided 24 minutes of non-face-to-face time during this encounter.    [name], MD

## 2019-01-16 ENCOUNTER — Telehealth: Payer: Self-pay | Admitting: Family Medicine

## 2019-01-16 DIAGNOSIS — N390 Urinary tract infection, site not specified: Secondary | ICD-10-CM

## 2019-01-16 NOTE — Telephone Encounter (Signed)
Go ahead and put in referral to urology, diagnosis recurrent UTI

## 2019-01-16 NOTE — Telephone Encounter (Signed)
Referral placed patient aware  

## 2019-01-17 ENCOUNTER — Telehealth: Payer: Self-pay | Admitting: Family Medicine

## 2019-01-17 ENCOUNTER — Telehealth: Payer: Self-pay

## 2019-01-17 NOTE — Telephone Encounter (Signed)
Patient called and left message to return call to schedule COVID-19 testing.

## 2019-01-17 NOTE — Telephone Encounter (Signed)
See Covid Testing Encounter.

## 2019-01-17 NOTE — Telephone Encounter (Signed)
If pain continues to worsen and they are concerned about it than they may need to come in to be evaluated or go to the emergency department.  We will get her to urology as soon as possible

## 2019-01-17 NOTE — Telephone Encounter (Signed)
Patient called, left VM to return the call to [phone] between the hours 0700-1900 Monday through Friday for scheduling.  Advice Only (spouse [name] is calling to ask Dettinger to stat pt referral to urology. I explained the referral has been placed and that covid may affect for apt to be scheduled. pt and spouse are concened about back pain and kidney damage.)

## 2019-01-18 NOTE — Telephone Encounter (Signed)
Gave husband phone number for [facility]. He will call to see how quickly they can see his wife.

## 2019-01-18 NOTE — Telephone Encounter (Signed)
I do not know if this means that they want [place] to contact the patient or if they are going to try again.  Obviously until the patient is tested she will likely not be seen by the urologist

## 2019-01-20 ENCOUNTER — Telehealth: Payer: Self-pay

## 2019-01-20 NOTE — Telephone Encounter (Signed)
FYI, patient's Medicaid will not pay for Pregabalin capsules.  Covered alternatives are:  Duloxetine capsule Gabapentin capsule or tablet

## 2019-01-23 NOTE — Telephone Encounter (Signed)
Patient is already on Effexor so she cannot take duloxetine and has already tried and failed gabapentin in the past, can we try for prior authorization

## 2019-01-25 DIAGNOSIS — R0902 Hypoxemia: Secondary | ICD-10-CM | POA: Diagnosis not present

## 2019-01-26 DIAGNOSIS — N3941 Urge incontinence: Secondary | ICD-10-CM | POA: Diagnosis not present

## 2019-01-26 DIAGNOSIS — R351 Nocturia: Secondary | ICD-10-CM | POA: Diagnosis not present

## 2019-01-26 DIAGNOSIS — N390 Urinary tract infection, site not specified: Secondary | ICD-10-CM | POA: Diagnosis not present

## 2019-02-03 NOTE — Telephone Encounter (Addendum)
Prior Auth Pregabalin 150MG  capsules-APPROVED  Expires 01/29/2020  Spoke to [place] at [employer] ID #[id number] [place] conf # [id number]   [employer] Call-In Form To initiate the prior authorization process for this medication, please contact [employer] at [phone].

## 2019-02-08 ENCOUNTER — Other Ambulatory Visit: Payer: Self-pay | Admitting: Family Medicine

## 2019-02-10 DIAGNOSIS — N3020 Other chronic cystitis without hematuria: Secondary | ICD-10-CM | POA: Diagnosis not present

## 2019-02-10 DIAGNOSIS — N3941 Urge incontinence: Secondary | ICD-10-CM | POA: Diagnosis not present

## 2019-02-10 DIAGNOSIS — N1330 Unspecified hydronephrosis: Secondary | ICD-10-CM | POA: Diagnosis not present

## 2019-02-16 ENCOUNTER — Telehealth: Payer: Self-pay | Admitting: Family Medicine

## 2019-02-16 NOTE — Telephone Encounter (Signed)
lmtcb- refill's should be on file at [facility]. Patient needs to contact pharmacy.

## 2019-02-24 NOTE — Telephone Encounter (Signed)
Patient will need to contact [facility] for refill on medication

## 2019-02-25 DIAGNOSIS — R0902 Hypoxemia: Secondary | ICD-10-CM | POA: Diagnosis not present

## 2019-02-27 DIAGNOSIS — G894 Chronic pain syndrome: Secondary | ICD-10-CM | POA: Diagnosis not present

## 2019-02-27 DIAGNOSIS — M5136 Other intervertebral disc degeneration, lumbar region: Secondary | ICD-10-CM | POA: Diagnosis not present

## 2019-02-27 DIAGNOSIS — J449 Chronic obstructive pulmonary disease, unspecified: Secondary | ICD-10-CM | POA: Diagnosis not present

## 2019-02-27 DIAGNOSIS — M4802 Spinal stenosis, cervical region: Secondary | ICD-10-CM | POA: Diagnosis not present

## 2019-02-27 DIAGNOSIS — Z79891 Long term (current) use of opiate analgesic: Secondary | ICD-10-CM | POA: Diagnosis not present

## 2019-03-02 ENCOUNTER — Telehealth: Payer: Self-pay | Admitting: Family Medicine

## 2019-03-03 NOTE — Telephone Encounter (Signed)
Patient aware refills at pharmacy and approved through insurance until 01/29/2020

## 2019-03-14 ENCOUNTER — Other Ambulatory Visit: Payer: Self-pay | Admitting: Family Medicine

## 2019-03-28 DIAGNOSIS — R0902 Hypoxemia: Secondary | ICD-10-CM | POA: Diagnosis not present

## 2019-03-31 DIAGNOSIS — M4802 Spinal stenosis, cervical region: Secondary | ICD-10-CM | POA: Diagnosis not present

## 2019-03-31 DIAGNOSIS — Z79899 Other long term (current) drug therapy: Secondary | ICD-10-CM | POA: Diagnosis not present

## 2019-03-31 DIAGNOSIS — G894 Chronic pain syndrome: Secondary | ICD-10-CM | POA: Diagnosis not present

## 2019-03-31 DIAGNOSIS — Z79891 Long term (current) use of opiate analgesic: Secondary | ICD-10-CM | POA: Diagnosis not present

## 2019-03-31 DIAGNOSIS — J449 Chronic obstructive pulmonary disease, unspecified: Secondary | ICD-10-CM | POA: Diagnosis not present

## 2019-04-06 ENCOUNTER — Telehealth: Payer: Self-pay | Admitting: Family Medicine

## 2019-04-06 NOTE — Telephone Encounter (Signed)
Left message to call back  

## 2019-04-07 ENCOUNTER — Ambulatory Visit (INDEPENDENT_AMBULATORY_CARE_PROVIDER_SITE_OTHER): Payer: Medicaid Other | Admitting: Family Medicine

## 2019-04-07 ENCOUNTER — Encounter: Payer: Self-pay | Admitting: Family Medicine

## 2019-04-07 DIAGNOSIS — R221 Localized swelling, mass and lump, neck: Secondary | ICD-10-CM

## 2019-04-07 MED ORDER — BENZONATATE 100 MG PO CAPS: 100.0000 mg | ORAL_CAPSULE | Freq: Two times a day (BID) | ORAL | 0 refills | Status: DC | PRN

## 2019-04-07 NOTE — Progress Notes (Signed)
   Virtual Visit via telephone Note  I connected with Margaret Vaughn on 04/07/19 at 1527 by telephone and verified that I am speaking with the correct person using two identifiers. Margaret Vaughn is currently located at home and no other people are currently with her during visit. The provider, Fransisca Kaufmann , MD is located in their office at time of visit.  Call ended at 1533  I discussed the limitations, risks, security and privacy concerns of performing an evaluation and management service by telephone and the availability of in person appointments. I also discussed with the patient that there may be a patient responsible charge related to this service. The patient expressed understanding and agreed to proceed.   History and Present Illness: Patient is calling in about swelling in her neck that started a few months ago that swollen up and it is pressing against her ear and she has pressure in that. It is on her right side and is tight around her neck and into her ear. She denies any skin changes.  The swelling is inside her neck and throat.   No diagnosis found.    Review of Systems  Constitutional: Negative for chills and fever.  HENT: Negative for congestion, ear discharge and ear pain.   Eyes: Negative for redness and visual disturbance.  Respiratory: Positive for cough. Negative for chest tightness and shortness of breath.   Cardiovascular: Negative for chest pain and leg swelling.  Musculoskeletal: Negative for back pain, gait problem, neck pain and neck stiffness.  Skin: Negative for rash.  Neurological: Negative for dizziness, light-headedness and headaches.  Psychiatric/Behavioral: Negative for agitation and behavioral problems.  All other systems reviewed and are negative.   Observations/Objective: Patient sounds comfortable and in no acute distress  Assessment and Plan: Problem List Items Addressed This Visit    None    Visit Diagnoses    Localized  swelling, mass and lump, neck    -  Primary   Relevant Orders   Ambulatory referral to ENT   US Soft Tissue Head/Neck       Follow Up Instructions: As needed    I discussed the assessment and treatment plan with the patient. The patient was provided an opportunity to ask questions and all were answered. The patient agreed with the plan and demonstrated an understanding of the instructions.   The patient was advised to call back or seek an in-person evaluation if the symptoms worsen or if the condition fails to improve as anticipated.  The above assessment and management plan was discussed with the patient. The patient verbalized understanding of and has agreed to the management plan. Patient is aware to call the clinic if symptoms persist or worsen. Patient is aware when to return to the clinic for a follow-up visit. Patient educated on when it is appropriate to go to the emergency department.    I provided 6 minutes of non-face-to-face time during this encounter.    [name], MD

## 2019-04-13 ENCOUNTER — Telehealth: Payer: Self-pay | Admitting: Family Medicine

## 2019-04-13 NOTE — Telephone Encounter (Signed)
I have made 2 attempts to contact this patient concerning her Ref for [place] and her ENT ref - In Proficient I see that her Ref is in review for Dr. Deeann Saint Office.

## 2019-04-13 NOTE — Telephone Encounter (Signed)
Patient was seen on 04/07/2019 to address

## 2019-04-14 NOTE — Telephone Encounter (Signed)
Patient was called and made aware of U/S appt this morning.

## 2019-04-14 NOTE — Telephone Encounter (Signed)
Okay thanks I guess we can try again

## 2019-04-16 ENCOUNTER — Other Ambulatory Visit: Payer: Self-pay | Admitting: Family Medicine

## 2019-04-17 ENCOUNTER — Ambulatory Visit (HOSPITAL_COMMUNITY)
Admission: RE | Admit: 2019-04-17 | Discharge: 2019-04-17 | Disposition: A | Discharge status: Home / Self Care | Payer: Medicaid Other | Source: Ambulatory Visit | Attending: Family Medicine | Admitting: Family Medicine

## 2019-04-17 ENCOUNTER — Other Ambulatory Visit: Payer: Self-pay

## 2019-04-17 DIAGNOSIS — R221 Localized swelling, mass and lump, neck: Secondary | ICD-10-CM | POA: Diagnosis not present

## 2019-04-27 DIAGNOSIS — R0902 Hypoxemia: Secondary | ICD-10-CM | POA: Diagnosis not present

## 2019-05-12 DIAGNOSIS — Z1159 Encounter for screening for other viral diseases: Secondary | ICD-10-CM | POA: Diagnosis not present

## 2019-05-12 DIAGNOSIS — Z7189 Other specified counseling: Secondary | ICD-10-CM | POA: Diagnosis not present

## 2019-05-12 DIAGNOSIS — Z23 Encounter for immunization: Secondary | ICD-10-CM | POA: Diagnosis not present

## 2019-05-12 DIAGNOSIS — M4802 Spinal stenosis, cervical region: Secondary | ICD-10-CM | POA: Diagnosis not present

## 2019-05-12 DIAGNOSIS — Z79899 Other long term (current) drug therapy: Secondary | ICD-10-CM | POA: Diagnosis not present

## 2019-05-12 DIAGNOSIS — G894 Chronic pain syndrome: Secondary | ICD-10-CM | POA: Diagnosis not present

## 2019-05-14 ENCOUNTER — Other Ambulatory Visit: Payer: Self-pay | Admitting: Family Medicine

## 2019-05-14 DIAGNOSIS — J441 Chronic obstructive pulmonary disease with (acute) exacerbation: Secondary | ICD-10-CM

## 2019-05-14 DIAGNOSIS — K219 Gastro-esophageal reflux disease without esophagitis: Secondary | ICD-10-CM

## 2019-05-28 DIAGNOSIS — R0902 Hypoxemia: Secondary | ICD-10-CM | POA: Diagnosis not present

## 2019-06-01 ENCOUNTER — Ambulatory Visit (INDEPENDENT_AMBULATORY_CARE_PROVIDER_SITE_OTHER): Payer: Medicaid Other | Admitting: Otolaryngology

## 2019-06-09 DIAGNOSIS — M4802 Spinal stenosis, cervical region: Secondary | ICD-10-CM | POA: Diagnosis not present

## 2019-06-09 DIAGNOSIS — G894 Chronic pain syndrome: Secondary | ICD-10-CM | POA: Diagnosis not present

## 2019-06-09 DIAGNOSIS — Z1159 Encounter for screening for other viral diseases: Secondary | ICD-10-CM | POA: Diagnosis not present

## 2019-06-09 DIAGNOSIS — J449 Chronic obstructive pulmonary disease, unspecified: Secondary | ICD-10-CM | POA: Diagnosis not present

## 2019-06-09 DIAGNOSIS — Z79899 Other long term (current) drug therapy: Secondary | ICD-10-CM | POA: Diagnosis not present

## 2019-06-10 ENCOUNTER — Other Ambulatory Visit: Payer: Self-pay | Admitting: Family Medicine

## 2019-06-11 ENCOUNTER — Other Ambulatory Visit: Payer: Self-pay | Admitting: Family Medicine

## 2019-06-12 NOTE — Telephone Encounter (Signed)
Pt called and aware - appt made

## 2019-06-12 NOTE — Telephone Encounter (Signed)
Dettinger. NTBS 30 days given 05/12/19

## 2019-06-14 ENCOUNTER — Other Ambulatory Visit: Payer: Self-pay | Admitting: Nurse Practitioner

## 2019-06-14 DIAGNOSIS — M4802 Spinal stenosis, cervical region: Secondary | ICD-10-CM

## 2019-06-15 ENCOUNTER — Encounter: Payer: Self-pay | Admitting: Family Medicine

## 2019-06-15 ENCOUNTER — Ambulatory Visit (INDEPENDENT_AMBULATORY_CARE_PROVIDER_SITE_OTHER): Payer: Medicaid Other | Admitting: Family Medicine

## 2019-06-15 DIAGNOSIS — F4310 Post-traumatic stress disorder, unspecified: Secondary | ICD-10-CM | POA: Diagnosis not present

## 2019-06-15 DIAGNOSIS — E782 Mixed hyperlipidemia: Secondary | ICD-10-CM

## 2019-06-15 DIAGNOSIS — M797 Fibromyalgia: Secondary | ICD-10-CM

## 2019-06-15 DIAGNOSIS — F411 Generalized anxiety disorder: Secondary | ICD-10-CM | POA: Diagnosis not present

## 2019-06-15 DIAGNOSIS — J439 Emphysema, unspecified: Secondary | ICD-10-CM

## 2019-06-15 DIAGNOSIS — I10 Essential (primary) hypertension: Secondary | ICD-10-CM | POA: Diagnosis not present

## 2019-06-15 DIAGNOSIS — E1149 Type 2 diabetes mellitus with other diabetic neurological complication: Secondary | ICD-10-CM

## 2019-06-15 DIAGNOSIS — K219 Gastro-esophageal reflux disease without esophagitis: Secondary | ICD-10-CM | POA: Diagnosis not present

## 2019-06-15 DIAGNOSIS — G629 Polyneuropathy, unspecified: Secondary | ICD-10-CM

## 2019-06-15 DIAGNOSIS — H7291 Unspecified perforation of tympanic membrane, right ear: Secondary | ICD-10-CM | POA: Diagnosis not present

## 2019-06-15 MED ORDER — PREGABALIN 150 MG PO CAPS: 150.0000 mg | ORAL_CAPSULE | Freq: Three times a day (TID) | ORAL | 2 refills | Status: DC

## 2019-06-15 MED ORDER — FLUTICASONE-SALMETEROL 100-50 MCG/DOSE IN AEPB: 1.0000 | INHALATION_SPRAY | Freq: Two times a day (BID) | RESPIRATORY_TRACT | 11 refills | Status: DC

## 2019-06-15 MED ORDER — ROSUVASTATIN CALCIUM 20 MG PO TABS: 20.0000 mg | ORAL_TABLET | Freq: Every day | ORAL | 3 refills | Status: DC

## 2019-06-15 MED ORDER — ESOMEPRAZOLE MAGNESIUM 40 MG PO CPDR: 40.0000 mg | DELAYED_RELEASE_CAPSULE | Freq: Every day | ORAL | 3 refills | Status: DC

## 2019-06-15 MED ORDER — FLUTICASONE PROPIONATE 50 MCG/ACT NA SUSP: 1.0000 | Freq: Two times a day (BID) | NASAL | 1 refills | Status: DC | PRN

## 2019-06-15 MED ORDER — VENLAFAXINE HCL ER 150 MG PO CP24: 150.0000 mg | ORAL_CAPSULE | Freq: Every day | ORAL | 3 refills | Status: DC

## 2019-06-15 NOTE — Progress Notes (Signed)
Virtual Visit via telephone Note  I connected with Margaret Vaughn on 06/15/19 at 1500 by telephone and verified that I am speaking with the correct person using two identifiers. Margaret Vaughn is currently located at home and no other people are currently with her during visit. The provider, Fransisca Kaufmann Theodora Lalanne, MD is located in their office at time of visit.  Call ended at 1520  I discussed the limitations, risks, security and privacy concerns of performing an evaluation and management service by telephone and the availability of in person appointments. I also discussed with the patient that there may be a patient responsible charge related to this service. The patient expressed understanding and agreed to proceed.   History and Present Illness: Patient is taking PTSD and fibromyalgia and takes effexor and feels like it is doing well. She denies any suicidal ideations or thoughts of hurting herself.  Current rx- lyrica # meds rx- 90 Effectiveness of current meds-works well Adverse reactions form meds- none  Pill count performed-No Last drug screen - n/a ( high risk q45m moderate risk q652mlow risk yearly ) Urine drug screen today- No Was the [place]- yes  If yes were their any concerning findings? - sees pain management as well  No flowsheet data found.   Controlled substance contract signed on: n/a   Patient punctured ear drum with toothpick and is down on hearing and has pain in the ear.  Type 2 diabetes mellitus Patient comes in today for recheck of his diabetes. Patient has been currently taking janumet. Patient is currently on an ACE inhibitor/ARB. Patient has not seen an ophthalmologist this year. Patient denies any issues with their feet.   COPD Patient is coming in for COPD recheck today.  He is currently on spiriva and advair.  He has a mild chronic cough but denies any major coughing spells or wheezing spells.  He has 1nighttime symptoms per week and  3daytime symptoms per week currently.   Hyperlipidemia Patient is coming in for recheck of his hyperlipidemia. The patient is currently taking crestor. They deny any issues with myalgias or history of liver damage from it. They deny any focal numbness or weakness or chest pain.   Hypertension Patient is currently on enalapril, and their blood pressure today is unknown. Patient denies any lightheadedness or dizziness. Patient denies headaches, blurred vision, chest pains, shortness of breath, or weakness. Denies any side effects from medication and is content with current medication.   No diagnosis found.  Outpatient Encounter Medications as of 06/15/2019  Medication Sig  . ADVAIR DISKUS 100-50 MCG/DOSE AEPB TAKE 1 PUFF BY MOUTH EVERY DAY  . benzonatate (TESSALON) 100 MG capsule Take 1 capsule (100 mg total) by mouth 2 (two) times daily as needed for cough.  . Cyanocobalamin (VITAMIN B-12) 5000 MCG TBDP Take 5,000 mcg by mouth daily.  . enalapril (VASOTEC) 5 MG tablet Take 1 tablet (5 mg total) by mouth daily.  . Marland Kitchensomeprazole (NEXIUM) 40 MG capsule TAKE 1 CAPSULE BY MOUTH EVERY DAY  . fluticasone (FLONASE) 50 MCG/ACT nasal spray PLACE 1 SPRAY INTO BOTH NOSTRILS 2 (TWO) TIMES DAILY AS NEEDED FOR ALLERGIES OR RHINITIS.  . HYDROcodone-acetaminophen (NORCO/VICODIN) 5-325 MG tablet Take 2 tablets by mouth every 6 (six) hours as needed.  . Melatonin 300 MCG TABS Take 1 tablet by mouth at bedtime.  . Multiple Vitamins-Minerals (HAIR SKIN NAILS PO) Take 3 tablets by mouth daily.  . Multiple Vitamins-Minerals (MULTIVITAMIN WITH MINERALS) tablet Take 1  tablet by mouth daily.  Marland Kitchen nystatin (MYCOSTATIN) 100000 UNIT/ML suspension Take 5 mLs (500,000 Units total) by mouth 4 (four) times daily. (Patient not taking: Reported on 11/25/2018)  . Omega-3 Fatty Acids (FISH OIL) 1000 MG CAPS Take 2 capsules by mouth at bedtime.  Marland Kitchen oxyCODONE-acetaminophen (PERCOCET) 10-325 MG tablet Take 1 tablet by mouth every 8  (eight) hours as needed for pain.  Marland Kitchen penicillin v potassium (VEETID) 500 MG tablet Take 1 tablet (500 mg total) by mouth 4 (four) times daily.  . predniSONE (DELTASONE) 20 MG tablet 2 po at same time daily for 5 days  . pregabalin (LYRICA) 150 MG capsule Take 1 capsule (150 mg total) by mouth 3 (three) times daily.  Marland Kitchen PROAIR HFA 108 (90 Base) MCG/ACT inhaler TAKE 2 PUFFS BY MOUTH EVERY 6 HOURS AS NEEDED FOR WHEEZE OR SHORTNESS OF BREATH  . Probiotic Product (PROBIOTIC-10) CAPS Take 1 tablet by mouth daily.  . rosuvastatin (CRESTOR) 20 MG tablet Take 1 tablet (20 mg total) by mouth daily.  . sitaGLIPtin-metformin (JANUMET) 50-1000 MG tablet Take 1 tablet by mouth 2 (two) times daily with a meal.  . tiotropium (SPIRIVA HANDIHALER) 18 MCG inhalation capsule Place 1 capsule (18 mcg total) into inhaler and inhale daily.  Marland Kitchen venlafaxine XR (EFFEXOR-XR) 150 MG 24 hr capsule TAKE 1 CAPSULE (150 MG TOTAL) DAILY. (TOTAL 225 MG DAILY)(NEEDS TO BE SEEN BEFORE NEXT REFILL)  . venlafaxine XR (EFFEXOR-XR) 75 MG 24 hr capsule 225 mg daily (150 mg + 75 mg daily)  . VITAMIN D, ERGOCALCIFEROL, PO Take 1 capsule by mouth daily.   No facility-administered encounter medications on file as of 06/15/2019.     Review of Systems  Constitutional: Negative for chills and fever.  HENT: Positive for congestion, ear pain and hearing loss. Negative for ear discharge, sinus pressure, sinus pain, sneezing and sore throat.   Respiratory: Negative for chest tightness and shortness of breath.   Cardiovascular: Negative for chest pain and leg swelling.  Musculoskeletal: Negative for back pain and gait problem.  Skin: Negative for rash.  Neurological: Positive for numbness. Negative for dizziness, light-headedness and headaches.  Psychiatric/Behavioral: Negative for agitation, behavioral problems, dysphoric mood, self-injury, sleep disturbance and suicidal ideas. The patient is not nervous/anxious.   All other systems reviewed and  are negative.   Observations/Objective: Patient sounds comfortable and in no acute distress  Assessment and Plan: Problem List Items Addressed This Visit      Cardiovascular and Mediastinum   Essential hypertension, benign   Relevant Medications   rosuvastatin (CRESTOR) 20 MG tablet   Other Relevant Orders   CMP14+EGFR     Respiratory   COPD (chronic obstructive pulmonary disease) (HCC)   Relevant Medications   Fluticasone-Salmeterol (ADVAIR DISKUS) 100-50 MCG/DOSE AEPB   fluticasone (FLONASE) 50 MCG/ACT nasal spray     Digestive   GERD   Relevant Medications   esomeprazole (NEXIUM) 40 MG capsule   Other Relevant Orders   CBC with Differential/Platelet     Nervous and Auditory   Neuropathy   Relevant Medications   pregabalin (LYRICA) 150 MG capsule     Other   Hyperlipidemia   Relevant Medications   rosuvastatin (CRESTOR) 20 MG tablet   Other Relevant Orders   Lipid panel   GAD (generalized anxiety disorder) - Primary   Relevant Medications   venlafaxine XR (EFFEXOR-XR) 150 MG 24 hr capsule   Other Relevant Orders   CBC with Differential/Platelet   PTSD (post-traumatic stress disorder)  Relevant Medications   venlafaxine XR (EFFEXOR-XR) 150 MG 24 hr capsule   Other Relevant Orders   CBC with Differential/Platelet   Fibromyalgia   Relevant Medications   venlafaxine XR (EFFEXOR-XR) 150 MG 24 hr capsule   pregabalin (LYRICA) 150 MG capsule   Other Relevant Orders   CBC with Differential/Platelet    Other Visit Diagnoses    Type 2 diabetes mellitus with neurological complications (HCC)       Relevant Medications   rosuvastatin (CRESTOR) 20 MG tablet   pregabalin (LYRICA) 150 MG capsule   Other Relevant Orders   hgba1c   Ruptured eardrum, right       Relevant Orders   Ambulatory referral to ENT      No change in medications, keep effexor and lyrica.  Refer to ent for eardrum  Continue medications for blood pressure and diabetes and will have her  come in to do blood work if she feels comfortable, with the Covid increasing she does not feel comfortable right now and will hold off until she does feel little bit more comfortable coming in.  Continue copd meds Follow Up Instructions:  Follow up in 3 months    I discussed the assessment and treatment plan with the patient. The patient was provided an opportunity to ask questions and all were answered. The patient agreed with the plan and demonstrated an understanding of the instructions.   The patient was advised to call back or seek an in-person evaluation if the symptoms worsen or if the condition fails to improve as anticipated.  The above assessment and management plan was discussed with the patient. The patient verbalized understanding of and has agreed to the management plan. Patient is aware to call the clinic if symptoms persist or worsen. Patient is aware when to return to the clinic for a follow-up visit. Patient educated on when it is appropriate to go to the emergency department.    I provided 20 minutes of non-face-to-face time during this encounter.    [name], MD

## 2019-06-27 DIAGNOSIS — R0902 Hypoxemia: Secondary | ICD-10-CM | POA: Diagnosis not present

## 2019-07-07 DIAGNOSIS — G894 Chronic pain syndrome: Secondary | ICD-10-CM | POA: Diagnosis not present

## 2019-07-07 DIAGNOSIS — Z79891 Long term (current) use of opiate analgesic: Secondary | ICD-10-CM | POA: Diagnosis not present

## 2019-07-07 DIAGNOSIS — Z79899 Other long term (current) drug therapy: Secondary | ICD-10-CM | POA: Diagnosis not present

## 2019-07-07 DIAGNOSIS — J449 Chronic obstructive pulmonary disease, unspecified: Secondary | ICD-10-CM | POA: Diagnosis not present

## 2019-07-07 DIAGNOSIS — M4802 Spinal stenosis, cervical region: Secondary | ICD-10-CM | POA: Diagnosis not present

## 2019-07-08 ENCOUNTER — Other Ambulatory Visit: Payer: Self-pay

## 2019-07-08 ENCOUNTER — Ambulatory Visit
Admission: RE | Admit: 2019-07-08 | Discharge: 2019-07-08 | Disposition: A | Discharge status: Home / Self Care | Payer: Medicaid Other | Source: Ambulatory Visit | Attending: Nurse Practitioner | Admitting: Nurse Practitioner

## 2019-07-08 DIAGNOSIS — M4802 Spinal stenosis, cervical region: Secondary | ICD-10-CM | POA: Diagnosis not present

## 2019-07-28 DIAGNOSIS — R0902 Hypoxemia: Secondary | ICD-10-CM | POA: Diagnosis not present

## 2019-08-04 DIAGNOSIS — Z79899 Other long term (current) drug therapy: Secondary | ICD-10-CM | POA: Diagnosis not present

## 2019-08-04 DIAGNOSIS — J449 Chronic obstructive pulmonary disease, unspecified: Secondary | ICD-10-CM | POA: Diagnosis not present

## 2019-08-04 DIAGNOSIS — M4802 Spinal stenosis, cervical region: Secondary | ICD-10-CM | POA: Diagnosis not present

## 2019-08-04 DIAGNOSIS — G894 Chronic pain syndrome: Secondary | ICD-10-CM | POA: Diagnosis not present

## 2019-08-04 DIAGNOSIS — Z79891 Long term (current) use of opiate analgesic: Secondary | ICD-10-CM | POA: Diagnosis not present

## 2019-08-06 ENCOUNTER — Other Ambulatory Visit: Payer: Self-pay | Admitting: Family Medicine

## 2019-08-06 DIAGNOSIS — G629 Polyneuropathy, unspecified: Secondary | ICD-10-CM

## 2019-08-06 DIAGNOSIS — M797 Fibromyalgia: Secondary | ICD-10-CM

## 2019-08-06 DIAGNOSIS — E1149 Type 2 diabetes mellitus with other diabetic neurological complication: Secondary | ICD-10-CM

## 2019-08-18 ENCOUNTER — Ambulatory Visit: Payer: Medicaid Other | Admitting: Family Medicine

## 2019-08-28 DIAGNOSIS — R0902 Hypoxemia: Secondary | ICD-10-CM | POA: Diagnosis not present

## 2019-09-08 DIAGNOSIS — G894 Chronic pain syndrome: Secondary | ICD-10-CM | POA: Diagnosis not present

## 2019-09-08 DIAGNOSIS — Z79899 Other long term (current) drug therapy: Secondary | ICD-10-CM | POA: Diagnosis not present

## 2019-09-08 DIAGNOSIS — M4802 Spinal stenosis, cervical region: Secondary | ICD-10-CM | POA: Diagnosis not present

## 2019-09-08 DIAGNOSIS — J449 Chronic obstructive pulmonary disease, unspecified: Secondary | ICD-10-CM | POA: Diagnosis not present

## 2019-09-23 ENCOUNTER — Other Ambulatory Visit: Payer: Self-pay | Admitting: Family Medicine

## 2019-09-23 DIAGNOSIS — J441 Chronic obstructive pulmonary disease with (acute) exacerbation: Secondary | ICD-10-CM

## 2019-09-23 DIAGNOSIS — I10 Essential (primary) hypertension: Secondary | ICD-10-CM

## 2019-09-23 DIAGNOSIS — E1149 Type 2 diabetes mellitus with other diabetic neurological complication: Secondary | ICD-10-CM

## 2019-09-23 DIAGNOSIS — M797 Fibromyalgia: Secondary | ICD-10-CM

## 2019-09-23 DIAGNOSIS — G629 Polyneuropathy, unspecified: Secondary | ICD-10-CM

## 2019-09-25 ENCOUNTER — Telehealth: Payer: Self-pay | Admitting: *Deleted

## 2019-09-25 DIAGNOSIS — R0902 Hypoxemia: Secondary | ICD-10-CM | POA: Diagnosis not present

## 2019-09-25 NOTE — Telephone Encounter (Signed)
Janumet 50/1000 BID Request Date: 09/25/2019 Confirmation Number: [id number] W Health Plan: [employer] Payer: [facility] [place] Type: PHARMACY Recipient ID: [id number] S Last Name: DYMENT First Name: Margaret Vaughn

## 2019-09-28 NOTE — Telephone Encounter (Signed)
Prior Auth for Janumet-APPROVED till 09/19/20  PA # [id number]  [employer] portal  Pharmacy notified

## 2019-10-09 ENCOUNTER — Other Ambulatory Visit: Payer: Self-pay | Admitting: Family Medicine

## 2019-10-09 DIAGNOSIS — E1169 Type 2 diabetes mellitus with other specified complication: Secondary | ICD-10-CM

## 2019-10-13 DIAGNOSIS — J449 Chronic obstructive pulmonary disease, unspecified: Secondary | ICD-10-CM | POA: Diagnosis not present

## 2019-10-13 DIAGNOSIS — L853 Xerosis cutis: Secondary | ICD-10-CM | POA: Diagnosis not present

## 2019-10-13 DIAGNOSIS — G894 Chronic pain syndrome: Secondary | ICD-10-CM | POA: Diagnosis not present

## 2019-10-13 DIAGNOSIS — Z79899 Other long term (current) drug therapy: Secondary | ICD-10-CM | POA: Diagnosis not present

## 2019-10-13 DIAGNOSIS — M5136 Other intervertebral disc degeneration, lumbar region: Secondary | ICD-10-CM | POA: Diagnosis not present

## 2019-10-19 DIAGNOSIS — Z23 Encounter for immunization: Secondary | ICD-10-CM | POA: Diagnosis not present

## 2019-10-26 DIAGNOSIS — R0902 Hypoxemia: Secondary | ICD-10-CM | POA: Diagnosis not present

## 2019-11-10 DIAGNOSIS — M4802 Spinal stenosis, cervical region: Secondary | ICD-10-CM | POA: Diagnosis not present

## 2019-11-10 DIAGNOSIS — G894 Chronic pain syndrome: Secondary | ICD-10-CM | POA: Diagnosis not present

## 2019-11-10 DIAGNOSIS — M5136 Other intervertebral disc degeneration, lumbar region: Secondary | ICD-10-CM | POA: Diagnosis not present

## 2019-11-10 DIAGNOSIS — J449 Chronic obstructive pulmonary disease, unspecified: Secondary | ICD-10-CM | POA: Diagnosis not present

## 2019-11-10 DIAGNOSIS — Z79899 Other long term (current) drug therapy: Secondary | ICD-10-CM | POA: Diagnosis not present

## 2019-11-11 DIAGNOSIS — Z23 Encounter for immunization: Secondary | ICD-10-CM | POA: Diagnosis not present

## 2019-11-15 ENCOUNTER — Ambulatory Visit (INDEPENDENT_AMBULATORY_CARE_PROVIDER_SITE_OTHER): Payer: Medicaid Other | Admitting: Family Medicine

## 2019-11-15 ENCOUNTER — Encounter: Payer: Self-pay | Admitting: Family Medicine

## 2019-11-15 DIAGNOSIS — K219 Gastro-esophageal reflux disease without esophagitis: Secondary | ICD-10-CM

## 2019-11-15 DIAGNOSIS — E1169 Type 2 diabetes mellitus with other specified complication: Secondary | ICD-10-CM

## 2019-11-15 DIAGNOSIS — M797 Fibromyalgia: Secondary | ICD-10-CM | POA: Diagnosis not present

## 2019-11-15 DIAGNOSIS — E782 Mixed hyperlipidemia: Secondary | ICD-10-CM | POA: Diagnosis not present

## 2019-11-15 DIAGNOSIS — F411 Generalized anxiety disorder: Secondary | ICD-10-CM

## 2019-11-15 DIAGNOSIS — E1149 Type 2 diabetes mellitus with other diabetic neurological complication: Secondary | ICD-10-CM

## 2019-11-15 DIAGNOSIS — I10 Essential (primary) hypertension: Secondary | ICD-10-CM | POA: Diagnosis not present

## 2019-11-15 MED ORDER — JANUMET 50-1000 MG PO TABS: 1.0000 | ORAL_TABLET | Freq: Two times a day (BID) | ORAL | 3 refills | Status: DC

## 2019-11-15 MED ORDER — ATORVASTATIN CALCIUM 40 MG PO TABS: 40.0000 mg | ORAL_TABLET | Freq: Every day | ORAL | 3 refills | Status: DC

## 2019-11-15 MED ORDER — CYCLOBENZAPRINE HCL 10 MG PO TABS: 10.0000 mg | ORAL_TABLET | Freq: Three times a day (TID) | ORAL | 3 refills | Status: DC | PRN

## 2019-11-15 MED ORDER — DULOXETINE HCL 30 MG PO CPEP: ORAL_CAPSULE | ORAL | 1 refills | Status: DC

## 2019-11-15 MED ORDER — VENLAFAXINE HCL ER 37.5 MG PO CP24: ORAL_CAPSULE | ORAL | 0 refills | Status: DC

## 2019-11-15 MED ORDER — PREGABALIN 150 MG PO CAPS: 150.0000 mg | ORAL_CAPSULE | Freq: Three times a day (TID) | ORAL | 2 refills | Status: DC

## 2019-11-15 NOTE — Progress Notes (Signed)
Virtual Visit via telephone Note  I connected with Margaret Vaughn on 11/15/19 at 1352 by telephone and verified that I am speaking with the correct person using two identifiers. Margaret Vaughn is currently located at home and no other people are currently with her during visit. The provider, Fransisca Kaufmann Brandace Cargle, MD is located in their office at time of visit.  Call ended at 1412  I discussed the limitations, risks, security and privacy concerns of performing an evaluation and management service by telephone and the availability of in person appointments. I also discussed with the patient that there may be a patient responsible charge related to this service. The patient expressed understanding and agreed to proceed.   History and Present Illness: Type 2 diabetes mellitus Patient comes in today for recheck of his diabetes. Patient has been currently taking janumet and bs is over 200 most of the time except one was 69-369. Patient is currently on an ACE inhibitor/ARB. Patient has not seen an ophthalmologist this year. Patient denies any issues with their feet.    Hypertension Patient is currently on enalipril, and their blood pressure today is unknown. Patient denies any lightheadedness or dizziness. Patient denies headaches, blurred vision, chest pains, shortness of breath, or weakness. Denies any side effects from medication and is content with current medication.   Hyperlipidemia Patient is coming in for recheck of his hyperlipidemia. The patient is currently taking omega 3 and crestor. They deny any issues with myalgias or history of liver damage from it. They deny any focal numbness or weakness or chest pain.   Anxiety and fibromyalgia Patient is taking lyrica and effexor and it is helping and she is doing well on it. She denies any suicidal ideations. She is stressed and anxious and is not sleeping well. , she is having muscle cramps as well.  She has been fighting this for several  months. The lyrica and effexor are not helping enough.  No diagnosis found.  Outpatient Encounter Medications as of 11/15/2019  Medication Sig  . albuterol (VENTOLIN HFA) 108 (90 Base) MCG/ACT inhaler TAKE 2 PUFFS BY MOUTH EVERY 6 HOURS AS NEEDED FOR WHEEZE OR SHORTNESS OF BREATH  . Cyanocobalamin (VITAMIN B-12) 5000 MCG TBDP Take 5,000 mcg by mouth daily.  . enalapril (VASOTEC) 5 MG tablet TAKE 1 TABLET BY MOUTH EVERY DAY  . esomeprazole (NEXIUM) 40 MG capsule Take 1 capsule (40 mg total) by mouth daily.  . fluticasone (FLONASE) 50 MCG/ACT nasal spray Place 1 spray into both nostrils 2 (two) times daily as needed for allergies or rhinitis.  . Fluticasone-Salmeterol (ADVAIR DISKUS) 100-50 MCG/DOSE AEPB Inhale 1 puff into the lungs 2 (two) times daily.  Marland Kitchen HYDROcodone-acetaminophen (NORCO/VICODIN) 5-325 MG tablet Take 2 tablets by mouth every 6 (six) hours as needed.  . Melatonin 300 MCG TABS Take 1 tablet by mouth at bedtime.  . Multiple Vitamins-Minerals (HAIR SKIN NAILS PO) Take 3 tablets by mouth daily.  . Multiple Vitamins-Minerals (MULTIVITAMIN WITH MINERALS) tablet Take 1 tablet by mouth daily.  . Omega-3 Fatty Acids (FISH OIL) 1000 MG CAPS Take 2 capsules by mouth at bedtime.  Marland Kitchen oxyCODONE-acetaminophen (PERCOCET) 10-325 MG tablet Take 1 tablet by mouth every 8 (eight) hours as needed for pain.  . pregabalin (LYRICA) 150 MG capsule Take 1 capsule (150 mg total) by mouth 3 (three) times daily.  . Probiotic Product (PROBIOTIC-10) CAPS Take 1 tablet by mouth daily.  . rosuvastatin (CRESTOR) 20 MG tablet Take 1 tablet (20 mg  total) by mouth daily.  . sitaGLIPtin-metformin (JANUMET) 50-1000 MG tablet Take 1 tablet by mouth 2 (two) times daily with a meal. (Needs to be seen before next refill)  . tiotropium (SPIRIVA HANDIHALER) 18 MCG inhalation capsule Place 1 capsule (18 mcg total) into inhaler and inhale daily.  Marland Kitchen venlafaxine XR (EFFEXOR-XR) 150 MG 24 hr capsule Take 1 capsule (150 mg total)  by mouth daily with breakfast.  . VITAMIN D, ERGOCALCIFEROL, PO Take 1 capsule by mouth daily.   No facility-administered encounter medications on file as of 11/15/2019.    Review of Systems  Constitutional: Negative for chills and fever.  Eyes: Negative for visual disturbance.  Respiratory: Negative for chest tightness and shortness of breath.   Cardiovascular: Negative for chest pain and leg swelling.  Musculoskeletal: Negative for back pain and gait problem.  Skin: Negative for rash.  Neurological: Positive for numbness. Negative for weakness, light-headedness and headaches.  Psychiatric/Behavioral: Negative for agitation and behavioral problems.  All other systems reviewed and are negative.   Observations/Objective: Patient sounds comfortable and in no acute distress  Assessment and Plan: Problem List Items Addressed This Visit      Cardiovascular and Mediastinum   Essential hypertension, benign   Relevant Medications   atorvastatin (LIPITOR) 40 MG tablet     Digestive   GERD   Relevant Orders   CBC with Differential/Platelet     Endocrine   Type 2 diabetes mellitus with other specified complication (HCC) - Primary   Relevant Medications   sitaGLIPtin-metformin (JANUMET) 50-1000 MG tablet   atorvastatin (LIPITOR) 40 MG tablet   Other Relevant Orders   CMP14+EGFR   Bayer DCA Hb A1c Waived   Microalbumin / creatinine urine ratio     Other   Hyperlipidemia   Relevant Medications   atorvastatin (LIPITOR) 40 MG tablet   Other Relevant Orders   Lipid panel   GAD (generalized anxiety disorder)   Relevant Medications   DULoxetine (CYMBALTA) 30 MG capsule   Other Relevant Orders   CBC with Differential/Platelet   Fibromyalgia   Relevant Medications   DULoxetine (CYMBALTA) 30 MG capsule   cyclobenzaprine (FLEXERIL) 10 MG tablet   pregabalin (LYRICA) 150 MG capsule    Other Visit Diagnoses    Type 2 diabetes mellitus with neurological complications (HCC)        Relevant Medications   pregabalin (LYRICA) 150 MG capsule   sitaGLIPtin-metformin (JANUMET) 50-1000 MG tablet   atorvastatin (LIPITOR) 40 MG tablet     keep lyrica, gave taper of effexor and onto cymbalta, gave patient instructions.   Follow up plan: Return in about 6 weeks (around 12/27/2019), or if symptoms worsen or fail to improve, for htn and anxiety.     I discussed the assessment and treatment plan with the patient. The patient was provided an opportunity to ask questions and all were answered. The patient agreed with the plan and demonstrated an understanding of the instructions.   The patient was advised to call back or seek an in-person evaluation if the symptoms worsen or if the condition fails to improve as anticipated.  The above assessment and management plan was discussed with the patient. The patient verbalized understanding of and has agreed to the management plan. Patient is aware to call the clinic if symptoms persist or worsen. Patient is aware when to return to the clinic for a follow-up visit. Patient educated on when it is appropriate to go to the emergency department.    I provided 20  minutes of non-face-to-face time during this encounter.    [name], MD

## 2019-11-25 DIAGNOSIS — R0902 Hypoxemia: Secondary | ICD-10-CM | POA: Diagnosis not present

## 2019-12-08 DIAGNOSIS — M4802 Spinal stenosis, cervical region: Secondary | ICD-10-CM | POA: Diagnosis not present

## 2019-12-08 DIAGNOSIS — B370 Candidal stomatitis: Secondary | ICD-10-CM | POA: Diagnosis not present

## 2019-12-08 DIAGNOSIS — Z79899 Other long term (current) drug therapy: Secondary | ICD-10-CM | POA: Diagnosis not present

## 2019-12-08 DIAGNOSIS — G894 Chronic pain syndrome: Secondary | ICD-10-CM | POA: Diagnosis not present

## 2019-12-24 DIAGNOSIS — R519 Headache, unspecified: Secondary | ICD-10-CM | POA: Diagnosis not present

## 2019-12-24 DIAGNOSIS — R0602 Shortness of breath: Secondary | ICD-10-CM | POA: Diagnosis not present

## 2019-12-24 DIAGNOSIS — I452 Bifascicular block: Secondary | ICD-10-CM | POA: Diagnosis not present

## 2019-12-24 DIAGNOSIS — I440 Atrioventricular block, first degree: Secondary | ICD-10-CM | POA: Diagnosis not present

## 2019-12-26 DIAGNOSIS — R0902 Hypoxemia: Secondary | ICD-10-CM | POA: Diagnosis not present

## 2020-01-05 DIAGNOSIS — Z79899 Other long term (current) drug therapy: Secondary | ICD-10-CM | POA: Diagnosis not present

## 2020-01-25 ENCOUNTER — Other Ambulatory Visit: Payer: Self-pay | Admitting: Family Medicine

## 2020-01-25 DIAGNOSIS — J441 Chronic obstructive pulmonary disease with (acute) exacerbation: Secondary | ICD-10-CM

## 2020-01-25 DIAGNOSIS — R0902 Hypoxemia: Secondary | ICD-10-CM | POA: Diagnosis not present

## 2020-02-09 DIAGNOSIS — Z79899 Other long term (current) drug therapy: Secondary | ICD-10-CM | POA: Diagnosis not present

## 2020-02-25 DIAGNOSIS — R0902 Hypoxemia: Secondary | ICD-10-CM | POA: Diagnosis not present

## 2020-02-27 ENCOUNTER — Telehealth: Payer: Self-pay | Admitting: Family Medicine

## 2020-02-27 DIAGNOSIS — R600 Localized edema: Secondary | ICD-10-CM | POA: Diagnosis not present

## 2020-02-27 DIAGNOSIS — M79671 Pain in right foot: Secondary | ICD-10-CM | POA: Diagnosis not present

## 2020-02-27 DIAGNOSIS — L03115 Cellulitis of right lower limb: Secondary | ICD-10-CM | POA: Diagnosis not present

## 2020-02-27 DIAGNOSIS — M7989 Other specified soft tissue disorders: Secondary | ICD-10-CM | POA: Diagnosis not present

## 2020-02-27 DIAGNOSIS — S99911A Unspecified injury of right ankle, initial encounter: Secondary | ICD-10-CM | POA: Diagnosis not present

## 2020-02-27 DIAGNOSIS — M25571 Pain in right ankle and joints of right foot: Secondary | ICD-10-CM | POA: Diagnosis not present

## 2020-02-27 NOTE — Telephone Encounter (Signed)
  Incoming Patient Call  02/27/2020  What symptoms do you have? Right foot is swollen and she twisted it the other day it rolled the ankle and now she has cellulitis it is bright red and incredibly painful to touch  How long have you been sick? Initial swelling started about two weeks ago and the cellulitis started Sunday  Have you been seen for this problem? no  If your provider decides to give you a prescription, which pharmacy would you like for it to be sent to? Pt would like antibiotic sent to [facility]    Patient informed that this information will be sent to the clinical staff for review and that they should receive a follow up call.

## 2020-02-27 NOTE — Telephone Encounter (Signed)
Patient aware she will need to be seen for an antibiotic.  Offered an appt for tomorrow here at the office.  Patient declined and states she will go to urgent care

## 2020-03-08 DIAGNOSIS — G8929 Other chronic pain: Secondary | ICD-10-CM | POA: Diagnosis not present

## 2020-03-08 DIAGNOSIS — M545 Low back pain: Secondary | ICD-10-CM | POA: Diagnosis not present

## 2020-03-08 DIAGNOSIS — Z79899 Other long term (current) drug therapy: Secondary | ICD-10-CM | POA: Diagnosis not present

## 2020-03-08 DIAGNOSIS — G894 Chronic pain syndrome: Secondary | ICD-10-CM | POA: Diagnosis not present

## 2020-03-12 ENCOUNTER — Encounter: Payer: Self-pay | Admitting: *Deleted

## 2020-03-12 NOTE — Progress Notes (Addendum)
[facility]    Provider:  Dr Jaynee Eagles Requesting Provider: Jeanella Anton, NP Primary Care Provider:  Dettinger, Fransisca Kaufmann, MD  CC:  Migraines and numbness  HPI:  Margaret Vaughn is a 59 y.o. female here as requested by  Jeanella Anton, NP for migraines. I reviewed  Jeanella Anton, NP's notes: This is a patient on chronic opioid management for neck and back pain, past medical history of chronic pain, tobacco abuse, lumbar degenerative disc disease, cervical stenosis of spine, COPD, irregular heartbeat, chronic pain syndrome, sleep apnea in adult, insomnia, migraine headache, depression, diabetes type 2, hypercholesterolemia, current 2 packs/day of tobacco.   Here with husband who also provides much information. Chronic headaches for years, intractable, tried many medications. She has headaches "out of the blue",  She says the barometric pressure really triggers, they start on the left, radiate straight back behind the eyes then spreads, pulsating, pounding, throbbing, +photophobia/phonophobia "everything sensitivity", nausea, vomiting. 8 migraine days a month, 15 headache days. The migraines are severe, a quiet dark room helps, pain is "explosive". She has been to the emergency room many times most recently in May. Numbness and tingling in the hands and feet which is worsening but chronic. Right lower leg with cellulitis recently and she was on antibiotics and prednisone, still tender. Numbness worsening since then, even in the hands. She has sleep apnea( see sleep evaluation on Care Everywhere) and Bipap. She has multiple neurologic complaints, pain symptoms and other.    Reviewed notes, labs and imaging from outside physicians, which showed:  Reviewed Jeanella Anton, NP's notes:  She denies facial pain, headache or neck pain in association with her back pain but her back pain is a current pain level of 7 out of 10 in the upper back in the mid back and in the low back is  aching and constant and daily, exacerbated by lifting bending twisting and standing no associated numbness tingling paresthesias or weakness, she also has a history of migraines, she was started on Relpax for acute management, she is already on Lyrica and baclofen (which are also medications that can be used in migraine management).  Other medications include Effexor XR (which again can also be used for migraine management).  I reviewed pain management notes from "Care Everywhere" and, patient also has myofascial pain and syrinx (unclear where)   I reviewed MRI report of the brain and c-spine  from 2006: Impression being unremarkable MRI of the brain.  CSF flow study was also performed.  The cerebellar tonsils extend only 2 mm below the level of the foramen magnum which does not meet clinical criteria for Chiari malformation, otherwise normal.  The cervical spine showed straightening of the normal cervical curvature, heights and alignment of the cervical bodies within normal limits, the cervical cord is normal in caliber and signal without evidence of abnormal enhancement, C6-C7 there is a tiny focus of increased T2 signal within the cord however this is not confirmed on the T1 sequence.  At C2-C3 there is a mild central bulge without significant canal stenosis or neural foraminal narrowing, at C4-C5 there is right paracentral disc protrusion which causes moderate narrowing of the right neural foramen, at C5-C6 there is a broad disc bulge which narrows the ventral sac space, C6-C7 there is a mild bulge which does not cause significant canal stenosis or neural foraminal narrowing.  CSF flow study: To and fro flow is demonstrated anterior to the brainstem.  Decreased flow was seen dorsal to  the cerebellum.  Positive flow was demonstrated from the tip of the opisthion caudally  I reviewed CT scan of the head report from Dec 24, 2019 conclusion showed no acute intracranial abnormality, I reviewed report which  appeared to be normal, the brain no acute large vascular territory infarct, no mass lesion or mass-effect, no hydrocephalus, no acute hemorrhage.  CMP Dec 24, 2019 included creatinine 0.69, BUN 12 otherwise unremarkable; C BC showed elevated white blood cells at 14 otherwise within normal limits.  From a review of records, medications patient has taken that can be used in migraine management include: Lyrica, baclofen, Relpax, Effexor, aspirin, Wellbutrin, Flexeril, Decadron injection, Cymbalta, enalapril, chronic opioids, prednisone tablets, tizanidine. She has also tried verapamil. Imitrex.   There also multiple EMGs, sleep studies and other imaging under "care everywhere"   Review of Systems: Patient complains of symptoms per HPI as well as the following symptoms: chronic pain, numbness, fatigue. Pertinent negatives and positives per HPI. All others negative.   Social History   Socioeconomic History   Marital status: Married    Spouse name: Not on file   Number of children: 5   Years of education: Not on file   Highest education level: Not on file  Occupational History   Occupation: disabled    Employer: UNEMPLOYED  Tobacco Use   Smoking status: Current Every Day Smoker    Packs/day: 2.00    Years: 30.00    Pack years: 60.00    Types: Cigarettes   Smokeless tobacco: Never Used  Scientific laboratory technician Use: Never used  Substance and Sexual Activity   Alcohol use: No   Drug use: No   Sexual activity: Yes    Birth control/protection: Surgical  Other Topics Concern   Not on file  Social History Narrative   Not on file   Social Determinants of Health   Financial Resource Strain:    Difficulty of Paying Living Expenses:   Food Insecurity:    Worried About Charity fundraiser in the Last Year:    Arboriculturist in the Last Year:   Transportation Needs:    Film/video editor (Medical):    Lack of Transportation (Non-Medical):   Physical Activity:     Days of Exercise per Week:    Minutes of Exercise per Session:   Stress:    Feeling of Stress :   Social Connections:    Frequency of Communication with Friends and Family:    Frequency of Social Gatherings with Friends and Family:    Attends Religious Services:    Active Member of Clubs or Organizations:    Attends Music therapist:    Marital Status:   Intimate Partner Violence:    Fear of Current or Ex-Partner:    Emotionally Abused:    Physically Abused:    Sexually Abused:     Family History  Problem Relation Age of Onset   Ulcers Mother    Sleep apnea Mother    Migraines Mother    Cancer Mother        uterine   Arthritis Mother    Hypertension Mother    Heart disease Mother    Depression Mother    Hyperlipidemia Mother    Colon cancer Mother    Arrhythmia Son     Past Medical History:  Diagnosis Date   Acid reflux    Allergies    Asthma    Bronchitis    Cervical stenosis of  spine    Chronic pain syndrome    COPD (chronic obstructive pulmonary disease) (HCC)    DDD (degenerative disc disease), lumbar    Depression with anxiety    Diabetes mellitus without complication (HCC)    Fatigue    H/O blood clots    Headache(784.0)    Hemorrhoids, external    HTN (hypertension)    Hyperlipidemia    Hypoxemia    Insomnia    Memory loss    Myalgia and myositis    Narcolepsy    OSA (obstructive sleep apnea)    Sleeping difficulty    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[place])    Thoracalgia    Tobacco abuse    Tremor    Ulcer disease     Patient Active Problem List   Diagnosis Date Noted   Fibromyalgia 01/06/2019   Neuropathy 01/06/2019   Overweight 03/11/2018   COPD (chronic obstructive pulmonary disease) ([place]) 03/11/2018   Type 2 diabetes mellitus with other specified complication ([place]) 24/58/0998   PTSD (post-traumatic stress disorder) 03/11/2018   GAD (generalized anxiety disorder) 01/30/2014    Essential hypertension, benign 01/30/2014   Hyperlipidemia 08/10/2012   Tobacco abuse 08/10/2012   GERD 12/24/2009    Past Surgical History:  Procedure Laterality Date   ABDOMINAL HYSTERECTOMY     CHOLECYSTECTOMY     OOPHORECTOMY     tonsillectomy      Current Outpatient Medications  Medication Sig Dispense Refill   albuterol (VENTOLIN HFA) 108 (90 Base) MCG/ACT inhaler TAKE 2 PUFFS BY MOUTH EVERY 6 HOURS AS NEEDED FOR WHEEZE OR SHORTNESS OF BREATH 18 g 2   atorvastatin (LIPITOR) 40 MG tablet Take 1 tablet (40 mg total) by mouth daily. 90 tablet 3   enalapril (VASOTEC) 5 MG tablet TAKE 1 TABLET BY MOUTH EVERY DAY 90 tablet 3   Fluticasone-Salmeterol (ADVAIR DISKUS) 100-50 MCG/DOSE AEPB Inhale 1 puff into the lungs 2 (two) times daily. 60 each 11   Melatonin 300 MCG TABS Take 1 tablet by mouth at bedtime.     Multiple Vitamins-Minerals (HAIR SKIN NAILS PO) Take 3 tablets by mouth daily.     Multiple Vitamins-Minerals (MULTIVITAMIN WITH MINERALS) tablet Take 1 tablet by mouth daily.     Omega-3 Fatty Acids (FISH OIL) 1000 MG CAPS Take 2 capsules by mouth at bedtime.     oxyCODONE-acetaminophen (PERCOCET) 10-325 MG tablet Take 1 tablet by mouth every 8 (eight) hours as needed for pain.     pregabalin (LYRICA) 150 MG capsule Take 1 capsule (150 mg total) by mouth 3 (three) times daily. 90 capsule 2   Probiotic Product (PROBIOTIC-10) CAPS Take 1 tablet by mouth daily.     sitaGLIPtin-metformin (JANUMET) 50-1000 MG tablet Take 1 tablet by mouth 2 (two) times daily with a meal. (Needs to be seen before next refill) 180 tablet 3   SPIRIVA HANDIHALER 18 MCG inhalation capsule INHALE CONTENTS OF 1 CAPSULE VIA HANDIHALER ONCE DAILY AT THE SAME TIME EVERY DAY 30 capsule 2   VITAMIN D, ERGOCALCIFEROL, PO Take 1 capsule by mouth daily.     Galcanezumab-gnlm (EMGALITY) 120 MG/ML SOAJ Inject 120 mg into the skin every 30 (thirty) days. 1 mL 11   rizatriptan (MAXALT-MLT) 10 MG  disintegrating tablet Take 1 tablet (10 mg total) by mouth as needed for migraine. May repeat in 2 hours if needed 9 tablet 11   No current facility-administered medications for this visit.    Allergies as of 03/13/2020 - Review Complete 03/13/2020  Allergen Reaction Noted  Iohexol Anaphylaxis 06/11/2004   Moxifloxacin Anaphylaxis    Shellfish allergy Hives 08/10/2012    Vitals: BP 127/85    Pulse 93    Ht 5' 4" (1.626 m)    Wt 160 lb (72.6 kg)    BMI 27.46 kg/m  Last Weight:  Wt Readings from Last 1 Encounters:  03/13/20 160 lb (72.6 kg)   Last Height:   Ht Readings from Last 1 Encounters:  03/13/20 5' 4" (1.626 m)     Physical exam: Exam: Gen: NAD, conversant, smells of cigarette smoke                   CV: RRR, no MRG. No Carotid Bruits. Edema and rubor right lower extremity Eyes: Conjunctivae clear without exudates or hemorrhage  Neuro: Detailed Neurologic Exam  Speech:    Speech is normal; fluent and spontaneous with normal comprehension.  Cognition:    The patient is oriented to person, place, and time;     recent and remote memory intact;     language fluent;     normal attention, concentration,     fund of knowledge Cranial Nerves:    The pupils are equal, round, and reactive to light. Attempted fundoscopy could not visualize due to small pupils.  The fundi are normal and spontaneous venous pulsations are present. Visual fields are full to finger confrontation. Extraocular movements are intact. Trigeminal sensation is intact and the muscles of mastication are normal. The face is symmetric. The palate elevates in the midline. Hearing intact. Voice is normal. Shoulder shrug is normal. The tongue has normal motion without fasciculations.   Coordination:    No dysmetria or ataxia  Gait:    Can stand unattended but difficulty walking  Motor Observation:    No asymmetry, no atrophy, and no involuntary movements noted. Tone:    Normal muscle tone.     Posture:  stooped    Strength:    Strength is V/V in the upper and lower limbs.      Sensation: pin prick decreased in a glove and stocking distribution, 4 secs vibration, intact but impaired proprioception     Reflex Exam:  DTR's:   Trace AJS otherwise Deep tendon reflexes in the upper and lower extremities are brisk bilaterally.   Toes:    The toes are equiv bilaterally.   Clonus:  2 beats right aj, otherwise clonus is absent.    Assessment/Plan:  59 y.o. female here as requested by  Jeanella Anton, NP for migraines. I reviewed  Jeanella Anton, NP's notes: This is a patient on chronic opioid management for neck and back pain, past medical history of chronic pain, tobacco abuse, lumbar degenerative disc disease, cervical stenosis of spine, COPD, irregular heartbeat, chronic pain syndrome, sleep apnea in adult, insomnia, migraine headache, depression, diabetes type 2, hypercholesterolemia, current 2 packs/day of tobacco.  Complicated patient, chronic pain, has seen neurology and multiple specialties in the past, multiple neurologic and somatic and other complaints.Polypharmacy, chronic opioids, sitting in a wheelchair.  Left arm and left leg emg: She has had multiple EMGs in the past, see "care everywhere", diagnosed with neuropathy and acute ongoing S1 radiculopathy in the past.  We will see if we find anything much different but do not think there is anything more that we can do for patient as far as her chronic pain, neck and back pain are concerned. Serum labs today for neuropathy Start CGRP today for migraine prevention (ask next time if she is compliant  on her bipap) Maxalt as needed for migraine acute management  From a review of records, medications patient has taken that can be used in migraine management include: Lyrica, baclofen, Relpax, Effexor, aspirin, Wellbutrin, Flexeril, Decadron injection, Cymbalta, enalapril, chronic opioids, prednisone tablets, tizanidine. She  has also tried verapamil. Imitrex. Long history, possibly more meds she has tried.   There also multiple EMGs, sleep studies and other imaging under "care everywhere"   Orders Placed This Encounter  Procedures   Vitamin B1   Vitamin B6   Comprehensive metabolic panel   CBC   D14 and Folate Panel   Methylmalonic acid, serum   Heavy metals, blood   Multiple Myeloma Panel (SPEP&IFE w/QIG)   Hemoglobin A1c   NCV with EMG(electromyography)   Meds ordered this encounter  Medications   rizatriptan (MAXALT-MLT) 10 MG disintegrating tablet    Sig: Take 1 tablet (10 mg total) by mouth as needed for migraine. May repeat in 2 hours if needed    Dispense:  9 tablet    Refill:  11   Galcanezumab-gnlm (EMGALITY) 120 MG/ML SOAJ    Sig: Inject 120 mg into the skin every 30 (thirty) days.    Dispense:  1 mL    Refill:  11    Cc: Dettinger, Fransisca Kaufmann, MD,  Dettinger, Fransisca Kaufmann, MD,  Jeanella Anton, NP  Sarina Ill, MD  [facility] [address] [place], [place] [address]  Phone [phone] Fax [phone]

## 2020-03-13 ENCOUNTER — Encounter: Payer: Self-pay | Admitting: Neurology

## 2020-03-13 ENCOUNTER — Ambulatory Visit: Payer: Medicaid Other | Admitting: Neurology

## 2020-03-13 VITALS — BP 127/85 | HR 93 | Ht 64.0 in | Wt 160.0 lb

## 2020-03-13 DIAGNOSIS — R200 Anesthesia of skin: Secondary | ICD-10-CM

## 2020-03-13 DIAGNOSIS — G43711 Chronic migraine without aura, intractable, with status migrainosus: Secondary | ICD-10-CM

## 2020-03-13 DIAGNOSIS — E1169 Type 2 diabetes mellitus with other specified complication: Secondary | ICD-10-CM

## 2020-03-13 MED ORDER — RIZATRIPTAN BENZOATE 10 MG PO TBDP: 10.0000 mg | ORAL_TABLET | ORAL | 11 refills | Status: DC | PRN

## 2020-03-13 MED ORDER — EMGALITY 120 MG/ML ~~LOC~~ SOAJ: 120.0000 mg | SUBCUTANEOUS | 11 refills | Status: DC

## 2020-03-13 NOTE — Patient Instructions (Signed)
Start emgality monthly Rizatriptan: Please take one tablet at the onset of your headache. If it does not improve the symptoms please take one additional tablet. Do not take more then 2 tablets in 24hrs. Do not take use more then 2 to 3 times in a week. Blood work Emg/ncs  Rizatriptan disintegrating tablets What is this medicine? RIZATRIPTAN (rye za TRIP tan) is used to treat migraines with or without aura. An aura is a strange feeling or visual disturbance that warns you of an attack. It is not used to prevent migraines. This medicine may be used for other purposes; ask your health care provider or pharmacist if you have questions. COMMON BRAND NAME(S): Maxalt-MLT What should I tell my health care provider before I take this medicine? They need to know if you have any of these conditions:  cigarette smoker  circulation problems in fingers and toes  diabetes  heart disease  high blood pressure  high cholesterol  history of irregular heartbeat  history of stroke  kidney disease  liver disease  stomach or intestine problems  an unusual or allergic reaction to rizatriptan, other medicines, foods, dyes, or preservatives  pregnant or trying to get pregnant  breast-feeding How should I use this medicine? Take this medicine by mouth. Follow the directions on the prescription label. Leave the tablet in the sealed blister pack until you are ready to take it. With dry hands, open the blister and gently remove the tablet. If the tablet breaks or crumbles, throw it away and take a new tablet out of the blister pack. Place the tablet in the mouth and allow it to dissolve, and then swallow. Do not cut, crush, or chew this medicine. You do not need water to take this medicine. Do not take it more often than directed. Talk to your pediatrician regarding the use of this medicine in children. While this drug may be prescribed for children as young as 6 years for selected conditions,  precautions do apply. Overdosage: If you think you have taken too much of this medicine contact a poison control center or emergency room at once. NOTE: This medicine is only for you. Do not share this medicine with others. What if I miss a dose? This does not apply. This medicine is not for regular use. What may interact with this medicine? Do not take this medicine with any of the following medicines:  certain medicines for migraine headache like almotriptan, eletriptan, frovatriptan, naratriptan, rizatriptan, sumatriptan, zolmitriptan  ergot alkaloids like dihydroergotamine, ergonovine, ergotamine, methylergonovine  MAOIs like Carbex, Eldepryl, Marplan, Nardil, and Parnate This medicine may also interact with the following medications:  certain medicines for depression, anxiety, or psychotic disorders  propranolol This list may not describe all possible interactions. Give your health care provider a list of all the medicines, herbs, non-prescription drugs, or dietary supplements you use. Also tell them if you smoke, drink alcohol, or use illegal drugs. Some items may interact with your medicine. What should I watch for while using this medicine? Visit your healthcare professional for regular checks on your progress. Tell your healthcare professional if your symptoms do not start to get better or if they get worse. You may get drowsy or dizzy. Do not drive, use machinery, or do anything that needs mental alertness until you know how this medicine affects you. Do not stand up or sit up quickly, especially if you are an older patient. This reduces the risk of dizzy or fainting spells. Alcohol may interfere with the  effect of this medicine. Your mouth may get dry. Chewing sugarless gum or sucking hard candy and drinking plenty of water may help. Contact your healthcare professional if the problem does not go away or is severe. If you take migraine medicines for 10 or more days a month, your  migraines may get worse. Keep a diary of headache days and medicine use. Contact your healthcare professional if your migraine attacks occur more frequently. What side effects may I notice from receiving this medicine? Side effects that you should report to your doctor or health care professional as soon as possible:  allergic reactions like skin rash, itching or hives, swelling of the face, lips, or tongue  chest pain or chest tightness  signs and symptoms of a dangerous change in heartbeat or heart rhythm like chest pain; dizziness; fast, irregular heartbeat; palpitations; feeling faint or lightheaded; falls; breathing problems  signs and symptoms of a stroke like changes in vision; confusion; trouble speaking or understanding; severe headaches; sudden numbness or weakness of the face, arm or leg; trouble walking; dizziness; loss of balance or coordination  signs and symptoms of serotonin syndrome like irritable; confusion; diarrhea; fast or irregular heartbeat; muscle twitching; stiff muscles; trouble walking; sweating; high fever; seizures; chills; vomiting Side effects that usually do not require medical attention (report to your doctor or health care professional if they continue or are bothersome):  diarrhea  dizziness  drowsiness  dry mouth  headache  nausea, vomiting  pain, tingling, numbness in the hands or feet  stomach pain This list may not describe all possible side effects. Call your doctor for medical advice about side effects. You may report side effects to FDA at 1-800-FDA-1088. Where should I keep my medicine? Keep out of the reach of children. Store at room temperature between 15 and 30 degrees C (59 and 86 degrees F). Protect from light and moisture. Throw away any unused medicine after the expiration date. NOTE: This sheet is a summary. It may not cover all possible information. If you have questions about this medicine, talk to your doctor, pharmacist, or  health care provider.  2020 Elsevier/Gold Standard (2018-01-25 14:58:08) Galcanezumab injection What is this medicine? GALCANEZUMAB (gal ka NEZ ue mab) is used to prevent migraines and treat cluster headaches. This medicine may be used for other purposes; ask your health care provider or pharmacist if you have questions. COMMON BRAND NAME(S): Emgality What should I tell my health care provider before I take this medicine? They need to know if you have any of these conditions:  an unusual or allergic reaction to galcanezumab, other medicines, foods, dyes, or preservatives  pregnant or trying to get pregnant  breast-feeding How should I use this medicine? This medicine is for injection under the skin. You will be taught how to prepare and give this medicine. Use exactly as directed. Take your medicine at regular intervals. Do not take your medicine more often than directed. It is important that you put your used needles and syringes in a special sharps container. Do not put them in a trash can. If you do not have a sharps container, call your pharmacist or healthcare provider to get one. Talk to your pediatrician regarding the use of this medicine in children. Special care may be needed. Overdosage: If you think you have taken too much of this medicine contact a poison control center or emergency room at once. NOTE: This medicine is only for you. Do not share this medicine with others. What if  I miss a dose? If you miss a dose, take it as soon as you can. If it is almost time for your next dose, take only that dose. Do not take double or extra doses. What may interact with this medicine? Interactions are not expected. This list may not describe all possible interactions. Give your health care provider a list of all the medicines, herbs, non-prescription drugs, or dietary supplements you use. Also tell them if you smoke, drink alcohol, or use illegal drugs. Some items may interact with your  medicine. What should I watch for while using this medicine? Tell your doctor or healthcare professional if your symptoms do not start to get better or if they get worse. What side effects may I notice from receiving this medicine? Side effects that you should report to your doctor or health care professional as soon as possible:  allergic reactions like skin rash, itching or hives, swelling of the face, lips, or tongue Side effects that usually do not require medical attention (report these to your doctor or health care professional if they continue or are bothersome):  pain, redness, or irritation at site where injected This list may not describe all possible side effects. Call your doctor for medical advice about side effects. You may report side effects to FDA at 1-800-FDA-1088. Where should I keep my medicine? Keep out of the reach of children. You will be instructed on how to store this medicine. Throw away any unused medicine after the expiration date on the label. NOTE: This sheet is a summary. It may not cover all possible information. If you have questions about this medicine, talk to your doctor, pharmacist, or health care provider.  2020 Elsevier/Gold Standard (2017-12-29 12:03:23)

## 2020-03-14 ENCOUNTER — Telehealth: Payer: Self-pay | Admitting: Neurology

## 2020-03-14 NOTE — Telephone Encounter (Signed)
Received a PA request for Emgality via [email or web address]. Key is [id number]. PA was submitted today. PA was approved from 03/14/2020 to 06/12/2020. Will fax a copy of the approval letter to pharmacy.

## 2020-03-17 LAB — COMPREHENSIVE METABOLIC PANEL
ALT: 14 IU/L (ref 0–32)
AST: 12 IU/L (ref 0–40)
Albumin/Globulin Ratio: 1.7 (ref 1.2–2.2)
Albumin: 3.9 g/dL (ref 3.8–4.9)
Alkaline Phosphatase: 91 IU/L (ref 48–121)
BUN/Creatinine Ratio: 14 (ref 9–23)
BUN: 9 mg/dL (ref 6–24)
Bilirubin Total: <0.2 mg/dL (ref 0.0–1.2)
CO2: 28 mmol/L (ref 20–29)
Calcium: 9.8 mg/dL (ref 8.7–10.2)
Chloride: 101 mmol/L (ref 96–106)
Creatinine, Ser: 0.66 mg/dL (ref 0.57–1.00)
GFR calc Af Amer: 112 mL/min/{1.73_m2} (ref >59)
GFR calc non Af Amer: 97 mL/min/{1.73_m2} (ref >59)
Globulin, Total: 2.3 g/dL (ref 1.5–4.5)
Glucose: 103 mg/dL — ABNORMAL HIGH (ref 65–99)
Potassium: 4.3 mmol/L (ref 3.5–5.2)
Sodium: 143 mmol/L (ref 134–144)
Total Protein: 6.2 g/dL (ref 6.0–8.5)

## 2020-03-17 LAB — METHYLMALONIC ACID, SERUM: Methylmalonic Acid: 204 nmol/L (ref 0–378)

## 2020-03-17 LAB — CBC
Hematocrit: 48.3 % — ABNORMAL HIGH (ref 34.0–46.6)
Hemoglobin: 15.9 g/dL (ref 11.1–15.9)
MCH: 29.9 pg (ref 26.6–33.0)
MCHC: 32.9 g/dL (ref 31.5–35.7)
MCV: 91 fL (ref 79–97)
Platelets: 343 10*3/uL (ref 150–450)
RBC: 5.31 x10E6/uL — ABNORMAL HIGH (ref 3.77–5.28)
RDW: 14.6 % (ref 11.7–15.4)
WBC: 10.9 10*3/uL — ABNORMAL HIGH (ref 3.4–10.8)

## 2020-03-17 LAB — MULTIPLE MYELOMA PANEL, SERUM
Albumin SerPl Elph-Mcnc: 3.2 g/dL (ref 2.9–4.4)
Albumin/Glob SerPl: 1.1 (ref 0.7–1.7)
Alpha 1: 0.2 g/dL (ref 0.0–0.4)
Alpha2 Glob SerPl Elph-Mcnc: 1.1 g/dL — ABNORMAL HIGH (ref 0.4–1.0)
B-Globulin SerPl Elph-Mcnc: 1 g/dL (ref 0.7–1.3)
Gamma Glob SerPl Elph-Mcnc: 0.7 g/dL (ref 0.4–1.8)
Globulin, Total: 3 g/dL (ref 2.2–3.9)
IgA/Immunoglobulin A, Serum: 208 mg/dL (ref 87–352)
IgG (Immunoglobin G), Serum: 711 mg/dL (ref 586–1602)
IgM (Immunoglobulin M), Srm: 105 mg/dL (ref 26–217)

## 2020-03-17 LAB — HEAVY METALS, BLOOD
Arsenic: 2 ug/L (ref 2–23)
Lead, Blood: <1 ug/dL (ref 0–4)
Mercury: <1 ug/L (ref 0.0–14.9)

## 2020-03-17 LAB — HEMOGLOBIN A1C
Est. average glucose Bld gHb Est-mCnc: 123 mg/dL
Hgb A1c MFr Bld: 5.9 % — ABNORMAL HIGH (ref 4.8–5.6)

## 2020-03-17 LAB — VITAMIN B6: Vitamin B6: 27.5 ug/L (ref 2.0–32.8)

## 2020-03-17 LAB — B12 AND FOLATE PANEL
Folate: 13.4 ng/mL (ref >3.0)
Vitamin B-12: 1610 pg/mL — ABNORMAL HIGH (ref 232–1245)

## 2020-03-17 LAB — VITAMIN B1: Thiamine: 225.8 nmol/L — ABNORMAL HIGH (ref 66.5–200.0)

## 2020-03-27 ENCOUNTER — Other Ambulatory Visit: Payer: Self-pay | Admitting: Family Medicine

## 2020-03-27 DIAGNOSIS — R0902 Hypoxemia: Secondary | ICD-10-CM | POA: Diagnosis not present

## 2020-03-27 DIAGNOSIS — M797 Fibromyalgia: Secondary | ICD-10-CM

## 2020-03-27 DIAGNOSIS — J441 Chronic obstructive pulmonary disease with (acute) exacerbation: Secondary | ICD-10-CM

## 2020-04-12 DIAGNOSIS — G8929 Other chronic pain: Secondary | ICD-10-CM | POA: Diagnosis not present

## 2020-04-12 DIAGNOSIS — Z20822 Contact with and (suspected) exposure to covid-19: Secondary | ICD-10-CM | POA: Diagnosis not present

## 2020-04-12 DIAGNOSIS — B370 Candidal stomatitis: Secondary | ICD-10-CM | POA: Diagnosis not present

## 2020-04-12 DIAGNOSIS — Z1159 Encounter for screening for other viral diseases: Secondary | ICD-10-CM | POA: Diagnosis not present

## 2020-04-12 DIAGNOSIS — G894 Chronic pain syndrome: Secondary | ICD-10-CM | POA: Diagnosis not present

## 2020-04-12 DIAGNOSIS — M545 Low back pain: Secondary | ICD-10-CM | POA: Diagnosis not present

## 2020-04-12 DIAGNOSIS — Z79899 Other long term (current) drug therapy: Secondary | ICD-10-CM | POA: Diagnosis not present

## 2020-04-24 NOTE — Progress Notes (Signed)
History: Complicated patient, chronic pain, has seen neurology and multiple specialties in the past, multiple neurologic and somatic and other complaints.Polypharmacy, chronic opioids, sitting in a wheelchair. Hands are stained brown likely from heavy smoking.  Performed left arm and left leg emg today: She has had multiple EMGs in the past, see "care everywhere", diagnosed with neuropathy and acute ongoing S1 radiculopathy in the past.    Reviewed prior EMG/nerve conductions while reviewing her chart, results were polyneuropathy as well as S1 acute radiculopathy. I could only find some notes on the emg/ncs and could not find the data to compare.  Today we discussed results of EMG nerve conduction study which showed moderately severe axonal polyneuropathy. She tells me today when she smokes cigarettes she cannot feel the cigarette in her hand anymore (I discussed smoking cessation and how smoking can affect the nerves). She also has likely acute on chronic left S1 radiculopathy. Cannot perform emg on the right leg due to what appears to be cellulitis right lower limb and I adbised to se her primary care asap to call or see them today as this can lead to many sequelae including blood infection and loss of limb. She has been evaluated by multiple neurologists, idiopathic neuropathy we discussed sending her back to [facility] so they can compare our findings with their data and see if any further evaluation is possible for her axonal neuropathy.In 2006 [facility] em/ncs showed right peroneal and motor and sural sensories stated normal and autonomic testing was normal per notes. EMG/NCS in 2019 at [facility] unable to see data. We will order MRI lumbar spine.   We will continue to treat her migraines and see if [facility] neurology can find anything much different and compare our emg/ncs results to their data in the past for progression but do not think there is anything more that [facility] can do for patient as far as her axonal sensorimotor  neuropathy, chronic pain, neck and pain are concerned.We can image the low back to look for S1 radic. Risk factors for neuropathy include heavy long-term smoking, type 2 diabetes,   I reviewed labs including hemoglobin A1c 5.9, thiamine 225, B6 normal 27.5, CMP unremarkable, CBC unremarkable, vitamin B12 1610, IFE did not show monoclonal antibody, I reviewed this with patient and her husband. Will order a few more labs to rule out other causes of peripheral polyneuropathy however discussed smoking and diabetes. Denies any alcohol use in the past.   MRI lumbar spine ordered as well as additional bloodwork  Orders Placed This Encounter  Procedures  . MR LUMBAR SPINE WO CONTRAST  . Sjogren's syndrome antibods(ssa + ssb)  . Hepatitis C antibody  . RPR  . HIV Antibody (routine testing w rflx)  . Rheumatoid factor  . ANA, IFA (with reflex)  . CYCLIC CITRUL PEPTIDE ANTIBODY, IGG/IGA    I spent 30 minutes of face-to-face and non-face-to-face time with patient on the  1. Neuropathy   2. Numbness   3. Weakness generalized   4. Idiopathic polyneuropathy   5. Lumbosacral radiculopathy at S1   6. Weakness of both lower extremities   7. Pain in both lower extremities    diagnosis.  This included previsit chart review, lab review, study review, order entry, electronic health record documentation, patient education on the different diagnostic and therapeutic options, counseling and coordination of care, risks and benefits of management, compliance, or risk factor reduction. This does not include time spent on emg/ncs procedure

## 2020-04-25 ENCOUNTER — Ambulatory Visit (INDEPENDENT_AMBULATORY_CARE_PROVIDER_SITE_OTHER): Payer: Medicaid Other | Admitting: Neurology

## 2020-04-25 ENCOUNTER — Telehealth: Payer: Self-pay | Admitting: Neurology

## 2020-04-25 ENCOUNTER — Other Ambulatory Visit: Payer: Self-pay

## 2020-04-25 ENCOUNTER — Encounter: Payer: Medicaid Other | Admitting: Neurology

## 2020-04-25 DIAGNOSIS — M79604 Pain in right leg: Secondary | ICD-10-CM

## 2020-04-25 DIAGNOSIS — R29898 Other symptoms and signs involving the musculoskeletal system: Secondary | ICD-10-CM | POA: Diagnosis not present

## 2020-04-25 DIAGNOSIS — M5417 Radiculopathy, lumbosacral region: Secondary | ICD-10-CM | POA: Diagnosis not present

## 2020-04-25 DIAGNOSIS — G609 Hereditary and idiopathic neuropathy, unspecified: Secondary | ICD-10-CM

## 2020-04-25 DIAGNOSIS — R531 Weakness: Secondary | ICD-10-CM | POA: Diagnosis not present

## 2020-04-25 DIAGNOSIS — R200 Anesthesia of skin: Secondary | ICD-10-CM

## 2020-04-25 DIAGNOSIS — M79605 Pain in left leg: Secondary | ICD-10-CM | POA: Diagnosis not present

## 2020-04-25 DIAGNOSIS — G629 Polyneuropathy, unspecified: Secondary | ICD-10-CM | POA: Diagnosis not present

## 2020-04-25 DIAGNOSIS — Z0289 Encounter for other administrative examinations: Secondary | ICD-10-CM

## 2020-04-25 NOTE — Progress Notes (Addendum)
Full Name: Margaret Vaughn Gender: Female MRN #: [id number] Date of Birth: 09/10/1960    Visit Date: 04/25/2020 08:41 Age: 59 Years Examining Physician: Margaret Ill, MD  Referring Physician: Jeanella Anton, NP Height: 5 feet 4 inch  History: Complicated patient, chronic pain, has seen neurology and multiple specialties in the past, multiple neurologic and somatic and other complaints.Polypharmacy, chronic opioids, sitting in a wheelchair. Heavy chronic smoker.  Summary: EMG/NCS performed on the left upper and lower extremities.  The left median motor nerve showed delayed distal onset latency (5.3 ms, normal less than 4.4) and decreased amplitude (44 m/s, normal greater than 49).  The left ulnar motor nerve showed delayed distal onset latency (4.8 ms, normal less than 3.3) and decreased conduction velocity (below elbow to wrist, 41 m/s, normal greater than 49) and decreased conduction velocity (above elbow to below elbow, 38 m/s, normal greater than 49).  The left peroneal motor nerve showed delayed distal onset latency (6.9 ms, normal less than 6.5) and decreased conduction velocity (fibular head to ankle, 35 m/s, normal greater than 44) and decreased conduction velocity (pop fossa to fib head, 42 m/s, normal greater than 44).  The left tibial motor nerve showed decreased conduction velocity (34 m/s, normal greater than 41).  The left radial sensory nerve showed delayed distal peak latency (3.4 ms, normal less than 2.9).  The left sural sensory nerve showed delayed distal peak latency (5.9 ms, normal less than 4.4).  The left superficial peroneal sensory nerve showed no response.  The left median sensory nerve showed delayed distal peak latency (4.3 ms, normal less than 3.4).  The left ulnar sensory nerve showed delayed distal peak latency (4 ms, normal less than 3.1). The left median/ulnar (palm) comparison nerve did not show abnormal peak latency difference.  The left peroneal F wave showed  delayed latency (59.9 ms, normal less than 56).  The left tibial F wave showed delayed latency (57.5 ms, normal less than 56).  The left median F wave showed delayed latency (34.5 ms, normal less than 31).  The left ulnar F wave showed delayed latency (36.6 ms, normal less than 32). All remaining nerves (as indicated in the following tables) were within normal limits.  The left gastrocnemius showed increased spontaneous activity.  The left extensor hallucis longus showed diminished motor unit recruitment.All remaining  muscles (as indicated in the following tables) were within normal limits.     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 There is also concomitant left s1 radiculopathy.   Margaret Vaughn, M.D.  [facility] [place], [place] [address] Tel: [phone] Fax: [phone]  Verbal informed consent was obtained from the patient, patient was informed of potential risk of procedure, including bruising, bleeding, hematoma formation, infection, muscle weakness, muscle pain, numbness, among others.         Margaret Vaughn    Nerve / Sites Muscle Latency Ref. Amplitude Ref. Rel Amp Segments Distance Velocity Ref. Area    ms ms mV mV %  cm m/s m/s mVms  L Median - APB     Wrist APB 5.3 ?4.4 8.6 ?4.0 100 Wrist - APB 7   38.7     Upper arm APB 9.8  8.6  99.6 Upper arm - Wrist 20 44 ?49 41.9  L Ulnar - ADM     Wrist ADM 4.8 ?3.3 13.0 ?6.0 100 Wrist - ADM 7   45.4     B.Elbow ADM 9.5  12.2  93.5 B.Elbow - Wrist 19 41 ?49 41.2     A.Elbow ADM 12.1  11.1  90.6 A.Elbow - B.Elbow 10 38 ?49 40.8         A.Elbow - Wrist      L Peroneal - EDB     Ankle EDB 6.9 ?6.5 5.7 ?2.0 100 Ankle - EDB 9   21.7     Fib head EDB 14.3  5.2  92.3 Fib head - Ankle 26 35 ?44 19.1     Pop fossa EDB 16.6  5.3  102 Pop fossa - Fib head 10 42 ?44 20.3         Pop fossa - Ankle      L Tibial - AH     Ankle AH 5.6 ?5.8 6.2 ?4.0 100 Ankle - AH 9   17.6     Pop fossa AH  15.5  4.5  72.3 Pop fossa - Ankle 34 34 ?41 16.3             SNC    Nerve / Sites Rec. Site Peak Lat Ref.  Amp Ref. Segments Distance Peak Diff Ref.    ms ms V V  cm ms ms  L Radial - Anatomical snuff box (Forearm)     Forearm Wrist 3.4 ?2.9 33 ?15 Forearm - Wrist 10    L Sural - Ankle (Calf)     Calf Ankle 5.9 ?4.4 6 ?6 Calf - Ankle 14    L Superficial peroneal - Ankle     Lat leg Ankle NR ?4.4 NR ?6 Lat leg - Ankle 14    L Median, Ulnar - Transcarpal comparison     Median Palm Wrist 2.8 ?2.2 95 ?35 Median Palm - Wrist 8       Ulnar Palm Wrist 2.8 ?2.2 21 ?12 Ulnar Palm - Wrist 8          Median Palm - Ulnar Palm  0.0 ?0.4  L Median - Orthodromic (Dig II, Mid palm)     Dig II Wrist 4.3 ?3.4 18 ?10 Dig II - Wrist 13    L Ulnar - Orthodromic, (Dig V, Mid palm)     Dig V Wrist 4.0 ?3.1 7 ?5 Dig V - Wrist 75                   F  Wave    Nerve F Lat Ref.   ms ms  L Peroneal - EDB 59.9 ?56.0  L Tibial - AH 57.5 ?56.0  L Median - APB 34.5 ?31.0  L Ulnar - ADM 36.6 ?32.0             EMG Summary Table    Spontaneous MUAP Recruitment  Muscle IA Fib PSW Fasc Other Amp Dur. Poly Pattern  L. Iliopsoas Normal None None None _______ Normal Normal Normal Normal  L. Vastus medialis Normal None None None _______ Normal Normal Normal Normal  L. Tibialis anterior Normal None None None _______ Normal Normal Normal Normal  L. Gastrocnemius (Medial head) Normal None 1+ None _______ Normal Normal Normal Normal  L. Biceps femoris (long head) Normal None None None _______ Normal Normal Normal Normal  L. Gluteus maximus Normal None None None _______ Normal Normal Normal Normal  L. Gluteus medius Normal None None None _______ Normal Normal Normal Normal  L. Lumbar paraspinals (low) Normal None None None _______ Normal Normal Normal Normal  L. Deltoid Normal None None None _______ Normal Normal Normal Normal  L. Triceps brachii Normal None None None _______ Normal Normal Normal Normal  L. Pronator  teres Normal None None None _______ Normal Normal Normal Normal  L. First dorsal interosseous Normal None None None _______ Normal Normal Normal Normal  L. Opponens pollicis Normal None None None _______ Normal Normal Normal Normal  L. Cervical paraspinals (low) Normal None None None _______ Normal Normal Normal Normal  L. Extensor hallucis longus Normal None None None _______ Normal Normal Normal Reduced  R. Cervical paraspinals (low) Normal None None None _______ Normal Normal Normal Normal  R. Lumbar paraspinals (low) Normal None None None _______ Normal Normal Normal Normal

## 2020-04-25 NOTE — Telephone Encounter (Signed)
mcd healthy blue pending  

## 2020-04-25 NOTE — Progress Notes (Signed)
See procedure note.

## 2020-04-26 DIAGNOSIS — R0902 Hypoxemia: Secondary | ICD-10-CM | POA: Diagnosis not present

## 2020-04-27 LAB — RHEUMATOID FACTOR: Rheumatoid fact SerPl-aCnc: <10 IU/mL (ref 0.0–13.9)

## 2020-04-27 LAB — RPR: RPR Ser Ql: NONREACTIVE

## 2020-04-27 LAB — SJOGREN'S SYNDROME ANTIBODS(SSA + SSB)
ENA SSA (RO) Ab: <0.2 AI (ref 0.0–0.9)
ENA SSB (LA) Ab: <0.2 AI (ref 0.0–0.9)

## 2020-04-27 LAB — CYCLIC CITRUL PEPTIDE ANTIBODY, IGG/IGA: Cyclic Citrullin Peptide Ab: 8 units (ref 0–19)

## 2020-04-27 LAB — HEPATITIS C ANTIBODY: Hep C Virus Ab: <0.1 s/co ratio (ref 0.0–0.9)

## 2020-04-27 LAB — ANTINUCLEAR ANTIBODIES, IFA: ANA Titer 1: NEGATIVE

## 2020-04-27 LAB — HIV ANTIBODY (ROUTINE TESTING W REFLEX): HIV Screen 4th Generation wRfx: NONREACTIVE

## 2020-04-29 NOTE — Telephone Encounter (Signed)
mcd healthy blue Margaret Vaughn: [id number] (exp. 04/25/20 to 06/23/20) order sent to GI. They will reach out to the patient to schedule.

## 2020-04-29 NOTE — Procedures (Signed)
Full Name: Margaret Vaughn Gender: Female MRN #: [id number] Date of Birth: 30-Aug-1960    Visit Date: 04/25/2020 08:41 Age: 59 Years Examining Physician: Sarina Ill, MD  Referring Physician: Jeanella Anton, NP Height: 5 feet 4 inch  History: Complicated patient, chronic pain, has seen neurology and multiple specialties in the past, multiple neurologic and somatic and other complaints.Polypharmacy, chronic opioids, sitting in a wheelchair. Heavy chronic smoker.  Summary: EMG/NCS performed on the left upper and lower extremities.  The left median motor nerve showed delayed distal onset latency (5.3 ms, normal less than 4.4) and decreased amplitude (44 m/s, normal greater than 49).  The left ulnar motor nerve showed delayed distal onset latency (4.8 ms, normal less than 3.3) and decreased conduction velocity (below elbow to wrist, 41 m/s, normal greater than 49) and decreased conduction velocity (above elbow to below elbow, 38 m/s, normal greater than 49).  The left peroneal motor nerve showed delayed distal onset latency (6.9 ms, normal less than 6.5) and decreased conduction velocity (fibular head to ankle, 35 m/s, normal greater than 44) and decreased conduction velocity (pop fossa to fib head, 42 m/s, normal greater than 44).  The left tibial motor nerve showed decreased conduction velocity (34 m/s, normal greater than 41).  The left radial sensory nerve showed delayed distal peak latency (3.4 ms, normal less than 2.9).  The left sural sensory nerve showed delayed distal peak latency (5.9 ms, normal less than 4.4).  The left superficial peroneal sensory nerve showed no response.  The left median sensory nerve showed delayed distal peak latency (4.3 ms, normal less than 3.4).  The left ulnar sensory nerve showed delayed distal peak latency (4 ms, normal less than 3.1). The left median/ulnar (palm) comparison nerve did not show abnormal peak latency difference.  The left peroneal F wave showed  delayed latency (59.9 ms, normal less than 56).  The left tibial F wave showed delayed latency (57.5 ms, normal less than 56).  The left median F wave showed delayed latency (34.5 ms, normal less than 31).  The left ulnar F wave showed delayed latency (36.6 ms, normal less than 32). All remaining nerves (as indicated in the following tables) were within normal limits.  The left gastrocnemius showed increased spontaneous activity.  The left extensor hallucis longus showed diminished motor unit recruitment.All remaining  muscles (as indicated in the following tables) were within normal limits.     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 There is also concomitant left s1 radiculopathy.   Sarina Ill, M.D.  [facility] [place], [place] [address] Tel: [phone] Fax: [phone]  Verbal informed consent was obtained from the patient, patient was informed of potential risk of procedure, including bruising, bleeding, hematoma formation, infection, muscle weakness, muscle pain, numbness, among others.         [place]    Nerve / Sites Muscle Latency Ref. Amplitude Ref. Rel Amp Segments Distance Velocity Ref. Area    ms ms mV mV %  cm m/s m/s mVms  L Median - APB     Wrist APB 5.3 ?4.4 8.6 ?4.0 100 Wrist - APB 7   38.7     Upper arm APB 9.8  8.6  99.6 Upper arm - Wrist 20 44 ?49 41.9  L Ulnar - ADM     Wrist ADM 4.8 ?3.3 13.0 ?6.0 100 Wrist - ADM 7   45.4     B.Elbow ADM 9.5  12.2  93.5 B.Elbow - Wrist 19 41 ?49 41.2     A.Elbow ADM 12.1  11.1  90.6 A.Elbow - B.Elbow 10 38 ?49 40.8         A.Elbow - Wrist      L Peroneal - EDB     Ankle EDB 6.9 ?6.5 5.7 ?2.0 100 Ankle - EDB 9   21.7     Fib head EDB 14.3  5.2  92.3 Fib head - Ankle 26 35 ?44 19.1     Pop fossa EDB 16.6  5.3  102 Pop fossa - Fib head 10 42 ?44 20.3         Pop fossa - Ankle      L Tibial - AH     Ankle AH 5.6 ?5.8 6.2 ?4.0 100 Ankle - AH 9   17.6     Pop fossa AH  15.5  4.5  72.3 Pop fossa - Ankle 34 34 ?41 16.3             SNC    Nerve / Sites Rec. Site Peak Lat Ref.  Amp Ref. Segments Distance Peak Diff Ref.    ms ms V V  cm ms ms  L Radial - Anatomical snuff box (Forearm)     Forearm Wrist 3.4 ?2.9 33 ?15 Forearm - Wrist 10    L Sural - Ankle (Calf)     Calf Ankle 5.9 ?4.4 6 ?6 Calf - Ankle 14    L Superficial peroneal - Ankle     Lat leg Ankle NR ?4.4 NR ?6 Lat leg - Ankle 14    L Median, Ulnar - Transcarpal comparison     Median Palm Wrist 2.8 ?2.2 95 ?35 Median Palm - Wrist 8       Ulnar Palm Wrist 2.8 ?2.2 21 ?12 Ulnar Palm - Wrist 8          Median Palm - Ulnar Palm  0.0 ?0.4  L Median - Orthodromic (Dig II, Mid palm)     Dig II Wrist 4.3 ?3.4 18 ?10 Dig II - Wrist 13    L Ulnar - Orthodromic, (Dig V, Mid palm)     Dig V Wrist 4.0 ?3.1 7 ?5 Dig V - Wrist 10                   F  Wave    Nerve F Lat Ref.   ms ms  L Peroneal - EDB 59.9 ?56.0  L Tibial - AH 57.5 ?56.0  L Median - APB 34.5 ?31.0  L Ulnar - ADM 36.6 ?32.0             EMG Summary Table    Spontaneous MUAP Recruitment  Muscle IA Fib PSW Fasc Other Amp Dur. Poly Pattern  L. Iliopsoas Normal None None None _______ Normal Normal Normal Normal  L. Vastus medialis Normal None None None _______ Normal Normal Normal Normal  L. Tibialis anterior Normal None None None _______ Normal Normal Normal Normal  L. Gastrocnemius (Medial head) Normal None 1+ None _______ Normal Normal Normal Normal  L. Biceps femoris (long head) Normal None None None _______ Normal Normal Normal Normal  L. Gluteus maximus Normal None None None _______ Normal Normal Normal Normal  L. Gluteus medius Normal None None None _______ Normal Normal Normal Normal  L. Lumbar paraspinals (low) Normal None None None _______ Normal Normal Normal Normal  L. Deltoid Normal None None None _______ Normal Normal Normal Normal  L. Triceps brachii Normal None None None _______ Normal Normal Normal Normal  L. Pronator  teres Normal None None None _______ Normal Normal Normal Normal  L. First dorsal interosseous Normal None None None _______ Normal Normal Normal Normal  L. Opponens pollicis Normal None None None _______ Normal Normal Normal Normal  L. Cervical paraspinals (low) Normal None None None _______ Normal Normal Normal Normal  L. Extensor hallucis longus Normal None None None _______ Normal Normal Normal Reduced  R. Cervical paraspinals (low) Normal None None None _______ Normal Normal Normal Normal  R. Lumbar paraspinals (low) Normal None None None _______ Normal Normal Normal Normal

## 2020-05-03 DIAGNOSIS — N3020 Other chronic cystitis without hematuria: Secondary | ICD-10-CM | POA: Diagnosis not present

## 2020-05-03 DIAGNOSIS — R3914 Feeling of incomplete bladder emptying: Secondary | ICD-10-CM | POA: Diagnosis not present

## 2020-05-05 ENCOUNTER — Other Ambulatory Visit: Payer: Self-pay | Admitting: Family Medicine

## 2020-05-05 DIAGNOSIS — J441 Chronic obstructive pulmonary disease with (acute) exacerbation: Secondary | ICD-10-CM

## 2020-05-10 DIAGNOSIS — G8929 Other chronic pain: Secondary | ICD-10-CM | POA: Diagnosis not present

## 2020-05-10 DIAGNOSIS — G894 Chronic pain syndrome: Secondary | ICD-10-CM | POA: Diagnosis not present

## 2020-05-10 DIAGNOSIS — M545 Low back pain, unspecified: Secondary | ICD-10-CM | POA: Diagnosis not present

## 2020-05-10 DIAGNOSIS — Z79899 Other long term (current) drug therapy: Secondary | ICD-10-CM | POA: Diagnosis not present

## 2020-05-10 DIAGNOSIS — J449 Chronic obstructive pulmonary disease, unspecified: Secondary | ICD-10-CM | POA: Diagnosis not present

## 2020-05-18 ENCOUNTER — Ambulatory Visit
Admission: RE | Admit: 2020-05-18 | Discharge: 2020-05-18 | Disposition: A | Discharge status: Home / Self Care | Payer: Medicaid Other | Source: Ambulatory Visit | Attending: Neurology | Admitting: Neurology

## 2020-05-18 ENCOUNTER — Other Ambulatory Visit: Payer: Self-pay

## 2020-05-18 DIAGNOSIS — M79604 Pain in right leg: Secondary | ICD-10-CM | POA: Diagnosis not present

## 2020-05-18 DIAGNOSIS — M5417 Radiculopathy, lumbosacral region: Secondary | ICD-10-CM | POA: Diagnosis not present

## 2020-05-18 DIAGNOSIS — R200 Anesthesia of skin: Secondary | ICD-10-CM

## 2020-05-18 DIAGNOSIS — G629 Polyneuropathy, unspecified: Secondary | ICD-10-CM

## 2020-05-18 DIAGNOSIS — R29898 Other symptoms and signs involving the musculoskeletal system: Secondary | ICD-10-CM | POA: Diagnosis not present

## 2020-05-18 DIAGNOSIS — M79605 Pain in left leg: Secondary | ICD-10-CM

## 2020-05-21 ENCOUNTER — Telehealth: Payer: Self-pay | Admitting: *Deleted

## 2020-05-21 DIAGNOSIS — M5416 Radiculopathy, lumbar region: Secondary | ICD-10-CM

## 2020-05-21 NOTE — Telephone Encounter (Signed)
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 Nelva Bush for evaluation and possibly steroid injections if she is ok with that. thanks

## 2020-05-21 NOTE — Telephone Encounter (Signed)
I called the pt and discussed MRI lumbar spine results per Dr Jaynee Eagles. Pt aware of recommendation to refer to Dr Nelva Bush for evaluation and possible steroid injections and she is amenable to this plan. Pt did not have any questions at this time. Pt aware she will be called separately by Dr Jeralyn Ruths office to schedule. She verbalized appreciation for the call.

## 2020-05-21 NOTE — Telephone Encounter (Signed)
V.o. placed per Dr Jaynee Eagles for referral to Dr Nelva Bush. Dx: Lumbar Radiculopathy.

## 2020-05-23 DIAGNOSIS — Z23 Encounter for immunization: Secondary | ICD-10-CM | POA: Diagnosis not present

## 2020-05-27 DIAGNOSIS — R0902 Hypoxemia: Secondary | ICD-10-CM | POA: Diagnosis not present

## 2020-06-05 ENCOUNTER — Other Ambulatory Visit: Payer: Self-pay | Admitting: Family Medicine

## 2020-06-05 DIAGNOSIS — J441 Chronic obstructive pulmonary disease with (acute) exacerbation: Secondary | ICD-10-CM

## 2020-06-07 DIAGNOSIS — N644 Mastodynia: Secondary | ICD-10-CM | POA: Diagnosis not present

## 2020-06-07 DIAGNOSIS — G894 Chronic pain syndrome: Secondary | ICD-10-CM | POA: Diagnosis not present

## 2020-06-07 DIAGNOSIS — G8929 Other chronic pain: Secondary | ICD-10-CM | POA: Diagnosis not present

## 2020-06-07 DIAGNOSIS — M545 Low back pain, unspecified: Secondary | ICD-10-CM | POA: Diagnosis not present

## 2020-06-13 ENCOUNTER — Telehealth: Payer: Self-pay

## 2020-06-13 ENCOUNTER — Other Ambulatory Visit: Payer: Self-pay | Admitting: *Deleted

## 2020-06-13 DIAGNOSIS — J441 Chronic obstructive pulmonary disease with (acute) exacerbation: Secondary | ICD-10-CM

## 2020-06-13 MED ORDER — SPIRIVA HANDIHALER 18 MCG IN CAPS: ORAL_CAPSULE | RESPIRATORY_TRACT | 0 refills | Status: DC

## 2020-06-13 NOTE — Telephone Encounter (Signed)
PATIENT HAVING NUMBNESS AND TINGLING IN ARMS, LEGS, HANDS, AND FEET  APPOINTMENT SCHEDULED

## 2020-06-26 ENCOUNTER — Other Ambulatory Visit: Payer: Self-pay

## 2020-06-26 ENCOUNTER — Encounter: Payer: Self-pay | Admitting: Family Medicine

## 2020-06-26 ENCOUNTER — Ambulatory Visit: Payer: Medicaid Other | Admitting: Family Medicine

## 2020-06-26 VITALS — HR 104 | Temp 98.0°F | Ht 64.0 in

## 2020-06-26 DIAGNOSIS — E1149 Type 2 diabetes mellitus with other diabetic neurological complication: Secondary | ICD-10-CM | POA: Diagnosis not present

## 2020-06-26 DIAGNOSIS — M797 Fibromyalgia: Secondary | ICD-10-CM

## 2020-06-26 DIAGNOSIS — Z23 Encounter for immunization: Secondary | ICD-10-CM

## 2020-06-26 DIAGNOSIS — M4802 Spinal stenosis, cervical region: Secondary | ICD-10-CM

## 2020-06-26 DIAGNOSIS — E782 Mixed hyperlipidemia: Secondary | ICD-10-CM

## 2020-06-26 DIAGNOSIS — E1169 Type 2 diabetes mellitus with other specified complication: Secondary | ICD-10-CM

## 2020-06-26 DIAGNOSIS — M5416 Radiculopathy, lumbar region: Secondary | ICD-10-CM

## 2020-06-26 DIAGNOSIS — I10 Essential (primary) hypertension: Secondary | ICD-10-CM

## 2020-06-26 DIAGNOSIS — G950 Syringomyelia and syringobulbia: Secondary | ICD-10-CM

## 2020-06-26 DIAGNOSIS — J441 Chronic obstructive pulmonary disease with (acute) exacerbation: Secondary | ICD-10-CM | POA: Diagnosis not present

## 2020-06-26 DIAGNOSIS — R0902 Hypoxemia: Secondary | ICD-10-CM | POA: Diagnosis not present

## 2020-06-26 MED ORDER — SPIRIVA HANDIHALER 18 MCG IN CAPS: ORAL_CAPSULE | RESPIRATORY_TRACT | 3 refills | Status: DC

## 2020-06-26 MED ORDER — OXYCODONE HCL ER 20 MG PO T12A: 20.0000 mg | EXTENDED_RELEASE_TABLET | Freq: Two times a day (BID) | ORAL | 0 refills | Status: DC

## 2020-06-26 MED ORDER — JANUMET 50-1000 MG PO TABS: 1.0000 | ORAL_TABLET | Freq: Two times a day (BID) | ORAL | 3 refills | Status: DC

## 2020-06-26 MED ORDER — CYCLOBENZAPRINE HCL 10 MG PO TABS: ORAL_TABLET | ORAL | 3 refills | Status: DC

## 2020-06-26 MED ORDER — ENALAPRIL MALEATE 5 MG PO TABS: 5.0000 mg | ORAL_TABLET | Freq: Every day | ORAL | 3 refills | Status: DC

## 2020-06-26 MED ORDER — ATORVASTATIN CALCIUM 40 MG PO TABS: 40.0000 mg | ORAL_TABLET | Freq: Every day | ORAL | 3 refills | Status: DC

## 2020-06-26 MED ORDER — PREGABALIN 150 MG PO CAPS: 150.0000 mg | ORAL_CAPSULE | Freq: Three times a day (TID) | ORAL | 2 refills | Status: DC

## 2020-06-26 NOTE — Progress Notes (Signed)
Pulse (!) 104   Temp 98 F (36.7 C)   Ht 5\' 4"  (1.626 m)   SpO2 (!) 88%   BMI 27.46 kg/m    Subjective:   Patient ID: Margaret Vaughn, female    DOB: 22-Mar-1961, 59 y.o.   MRN: [id number]  HPI: Margaret Vaughn is a 58 y.o. female presenting on 06/26/2020 for No chief complaint on file.   HPI Pain assessment: Cause was increased from of pain-lumbar back pain and syringomyelia Pain location-lower back going down both legs Pain on scale of 1-10- 7 Frequency-Daily constantly What increases pain-everything these days including weather and sitting and laying. What makes pain Better-medication, was seen by a pain management but her insurance changed and now she needs to find a new one. Effects on ADL -becoming increasingly less mobile. Any change in general medical condition-worsening pain  Current opioids rx-oxycodone 20 mg twice daily as needed # meds rx-60 Effectiveness of current meds-making a transition on to new dosing until gets new pain medicine. Adverse reactions from pain meds-is more sedated, had been on higher doses, trying to adjust down. Morphine equivalent- 60  Pill count performed-No Last drug screen -N/A, was seen pain management and will get to new pain i management ( high risk q66m, moderate risk q50m, low risk yearly ) Urine drug screen today- No Was the [facility] reviewed-yes  If yes were their any concerning findings? -None   No flowsheet data found.   Pain contract signed on: n/a  Type 2 diabetes mellitus Patient comes in today for recheck of his diabetes. Patient has been currently taking Janumet. Patient is not currently on an ACE inhibitor/ARB. Patient has not seen an ophthalmologist this year. Patient denies any new issues with their feet. The symptom started onset as an adult hypertension and hyperlipidemia ARE RELATED TO DM   Hypertension Patient is currently on enalapril, and their blood pressure today is slightly elevated but has been having  pain. Patient denies any lightheadedness or dizziness. Patient denies headaches, blurred vision, chest pains, shortness of breath, or weakness. Denies any side effects from medication and is content with current medication.   Hyperlipidemia Patient is coming in for recheck of his hyperlipidemia. The patient is currently taking atorvastatin. They deny any issues with myalgias or history of liver damage from it. They deny any focal numbness or weakness or chest pain.   COPD Patient is coming in for COPD recheck today.  He is currently on Spiriva and albuterol and Albert.  He has a mild chronic cough but denies any major coughing spells or wheezing spells.  He has 2nighttime symptoms per week and 2daytime symptoms per week currently.   Relevant past medical, surgical, family and social history reviewed and updated as indicated. Interim medical history since our last visit reviewed. Allergies and medications reviewed and updated.  Review of Systems  Constitutional: Negative for chills and fever.  Eyes: Negative for visual disturbance.  Respiratory: Positive for cough and wheezing. Negative for chest tightness and shortness of breath.   Cardiovascular: Negative for chest pain and leg swelling.  Musculoskeletal: Positive for arthralgias, back pain and myalgias. Negative for gait problem.  Skin: Negative for rash.  Neurological: Negative for light-headedness and headaches.  Psychiatric/Behavioral: Negative for agitation and behavioral problems.  All other systems reviewed and are negative.   Per HPI unless specifically indicated above   Allergies as of 06/26/2020      Reactions   Iohexol Anaphylaxis   PT.GIVEN OMNI 300 FOR  CT ANGIO WAS GIVEN 50 MG.BENADRYL PRIOR TO SCAN HAD NO PROBLEMS   Moxifloxacin Anaphylaxis   Shellfish Allergy Hives      Medication List       Accurate as of June 26, 2020 12:05 PM. If you have any questions, ask your nurse or doctor.        albuterol 108 (90  Base) MCG/ACT inhaler Commonly known as: VENTOLIN HFA TAKE 2 PUFFS BY MOUTH EVERY 6 HOURS AS NEEDED FOR WHEEZE OR SHORTNESS OF BREATH   atorvastatin 40 MG tablet Commonly known as: LIPITOR Take 1 tablet (40 mg total) by mouth daily.   cyclobenzaprine 10 MG tablet Commonly known as: FLEXERIL TAKE 1 TABLET BY MOUTH THREE TIMES A DAY AS NEEDED FOR MUSCLE SPASMS   Emgality 120 MG/ML Soaj Generic drug: Galcanezumab-gnlm Inject 120 mg into the skin every 30 (thirty) days.   enalapril 5 MG tablet Commonly known as: VASOTEC TAKE 1 TABLET BY MOUTH EVERY DAY   Fish Oil 1000 MG Caps Take 2 capsules by mouth at bedtime.   Fluticasone-Salmeterol 100-50 MCG/DOSE Aepb Commonly known as: Advair Diskus Inhale 1 puff into the lungs 2 (two) times daily.   HAIR SKIN NAILS PO Take 3 tablets by mouth daily.   multivitamin with minerals tablet Take 1 tablet by mouth daily.   Janumet 50-1000 MG tablet Generic drug: sitaGLIPtin-metformin Take 1 tablet by mouth 2 (two) times daily with a meal. (Needs to be seen before next refill)   Melatonin 300 MCG Tabs Take 1 tablet by mouth at bedtime.   oxyCODONE-acetaminophen 10-325 MG tablet Commonly known as: PERCOCET Take 1 tablet by mouth every 8 (eight) hours as needed for pain.   pregabalin 150 MG capsule Commonly known as: LYRICA Take 1 capsule (150 mg total) by mouth 3 (three) times daily.   Probiotic-10 Caps Take 1 tablet by mouth daily.   rizatriptan 10 MG disintegrating tablet Commonly known as: MAXALT-MLT Take 1 tablet (10 mg total) by mouth as needed for migraine. May repeat in 2 hours if needed   Spiriva HandiHaler 18 MCG inhalation capsule Generic drug: tiotropium INHALE CONTENTS OF 1 CAPSULE VIA HANDIHALER ONCE DAILY AT THE SAME TIME EVERY DAY   VITAMIN D (ERGOCALCIFEROL) PO Take 1 capsule by mouth daily.        Objective:   There were no vitals taken for this visit.  Wt Readings from Last 3 Encounters:  03/13/20 160  lb (72.6 kg)  12/21/18 165 lb (74.8 kg)  09/27/18 175 lb 9.6 oz (79.7 kg)    Physical Exam Vitals and nursing note reviewed.  Constitutional:      General: She is not in acute distress.    Appearance: She is well-developed and well-nourished. She is not diaphoretic.  Eyes:     Extraocular Movements: EOM normal.     Conjunctiva/sclera: Conjunctivae normal.  Cardiovascular:     Rate and Rhythm: Normal rate and regular rhythm.     Pulses: Intact distal pulses.     Heart sounds: Normal heart sounds. No murmur heard.   Pulmonary:     Effort: Pulmonary effort is normal. No respiratory distress.     Breath sounds: Normal breath sounds. No wheezing.  Musculoskeletal:        General: Tenderness present. No edema.  Skin:    General: Skin is warm and dry.     Findings: No rash.  Neurological:     Mental Status: She is alert and oriented to person, place, and time.  Coordination: Coordination normal.  Psychiatric:        Mood and Affect: Mood and affect normal.        Behavior: Behavior normal.       Assessment & Plan:   Problem List Items Addressed This Visit      Cardiovascular and Mediastinum   Essential hypertension, benign   Relevant Medications   atorvastatin (LIPITOR) 40 MG tablet   enalapril (VASOTEC) 5 MG tablet     Endocrine   Type 2 diabetes mellitus with other specified complication (HCC)   Relevant Medications   atorvastatin (LIPITOR) 40 MG tablet   enalapril (VASOTEC) 5 MG tablet   sitaGLIPtin-metformin (JANUMET) 50-1000 MG tablet     Other   Hyperlipidemia   Relevant Medications   atorvastatin (LIPITOR) 40 MG tablet   enalapril (VASOTEC) 5 MG tablet   Fibromyalgia   Relevant Medications   cyclobenzaprine (FLEXERIL) 10 MG tablet   pregabalin (LYRICA) 150 MG capsule   oxyCODONE (OXYCONTIN) 20 mg 12 hr tablet   oxyCODONE (OXYCONTIN) 20 mg 12 hr tablet (Start on 07/26/2020)    Other Visit Diagnoses    Spinal stenosis of cervical region    -   Primary   Relevant Orders   Ambulatory referral to Pain Clinic   Type 2 diabetes mellitus with neurological complications (HCC)       Relevant Medications   atorvastatin (LIPITOR) 40 MG tablet   enalapril (VASOTEC) 5 MG tablet   pregabalin (LYRICA) 150 MG capsule   sitaGLIPtin-metformin (JANUMET) 50-1000 MG tablet   COPD exacerbation (HCC)       Relevant Medications   tiotropium (SPIRIVA HANDIHALER) 18 MCG inhalation capsule   Need for immunization against influenza       Relevant Orders   Flu Vaccine QUAD 36+ mos IM (Completed)   Lumbar nerve root compression       Relevant Medications   cyclobenzaprine (FLEXERIL) 10 MG tablet   pregabalin (LYRICA) 150 MG capsule   Other Relevant Orders   Ambulatory referral to Pain Clinic   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  Had blood work done in August, no change for any of her other medications. Follow up plan: Return in about 2 months (around 08/27/2020), or if symptoms worsen or fail to improve, for diabetes.  Counseling provided for all of the vaccine components No orders of the defined types were placed in this encounter.   Caryl Pina, MD [facility] 06/26/2020, 12:05 PM

## 2020-06-27 ENCOUNTER — Encounter: Payer: Self-pay | Admitting: Family Medicine

## 2020-06-27 DIAGNOSIS — G950 Syringomyelia and syringobulbia: Secondary | ICD-10-CM

## 2020-07-05 DIAGNOSIS — G8929 Other chronic pain: Secondary | ICD-10-CM | POA: Diagnosis not present

## 2020-07-05 DIAGNOSIS — M545 Low back pain, unspecified: Secondary | ICD-10-CM | POA: Diagnosis not present

## 2020-07-05 DIAGNOSIS — R079 Chest pain, unspecified: Secondary | ICD-10-CM | POA: Diagnosis not present

## 2020-07-05 DIAGNOSIS — M4802 Spinal stenosis, cervical region: Secondary | ICD-10-CM | POA: Diagnosis not present

## 2020-07-05 DIAGNOSIS — Z79899 Other long term (current) drug therapy: Secondary | ICD-10-CM | POA: Diagnosis not present

## 2020-07-11 ENCOUNTER — Encounter: Payer: Self-pay | Admitting: Family Medicine

## 2020-07-11 DIAGNOSIS — G950 Syringomyelia and syringobulbia: Secondary | ICD-10-CM

## 2020-07-15 ENCOUNTER — Ambulatory Visit: Payer: Medicaid Other | Admitting: Family Medicine

## 2020-07-15 DIAGNOSIS — G950 Syringomyelia and syringobulbia: Secondary | ICD-10-CM | POA: Diagnosis not present

## 2020-07-15 DIAGNOSIS — J45909 Unspecified asthma, uncomplicated: Secondary | ICD-10-CM | POA: Diagnosis not present

## 2020-07-15 NOTE — Telephone Encounter (Signed)
Pts husband called to let Dr Dettinger know that he spoke with someone at [facility] who confirmed with him that they were only able to fill order for the bedside commode and shower seat.   Pts husband is going to call [facility] and see if they can fill the other orders that they have requested.  Will call office back with update.

## 2020-07-16 ENCOUNTER — Telehealth: Payer: Self-pay | Admitting: Neurology

## 2020-07-16 ENCOUNTER — Other Ambulatory Visit: Payer: Self-pay | Admitting: Family Medicine

## 2020-07-16 ENCOUNTER — Other Ambulatory Visit: Payer: Self-pay | Admitting: Neurology

## 2020-07-16 DIAGNOSIS — R531 Weakness: Secondary | ICD-10-CM

## 2020-07-16 DIAGNOSIS — R269 Unspecified abnormalities of gait and mobility: Secondary | ICD-10-CM

## 2020-07-16 DIAGNOSIS — W19XXXA Unspecified fall, initial encounter: Secondary | ICD-10-CM

## 2020-07-16 DIAGNOSIS — R270 Ataxia, unspecified: Secondary | ICD-10-CM

## 2020-07-16 DIAGNOSIS — G1229 Other motor neuron disease: Secondary | ICD-10-CM

## 2020-07-16 NOTE — Telephone Encounter (Signed)
I called the pt & LVM (ok per DPR) advising that I spoke with Dr Jaynee Eagles and even at our best efforts her insurance could still take a week or two to get back on the authorization for imaging. I stressed in the message that Dr Cathren Laine recommendation at this time based on their report that the pt is worsening is for pt to go to the ER. They can do the imaging much quicker there, admit if needed. I left the office number for call back if she has any questions.

## 2020-07-16 NOTE — Telephone Encounter (Signed)
They said [facility] called them recently so I don;t know what happened. But I placed neew order to [place]!

## 2020-07-16 NOTE — Telephone Encounter (Signed)
Patient has been transferred to [facility], nothing further from [place]. If patient requests appointment must discuss with Dr. Jaynee Eagles. I have nothing further for patient.

## 2020-07-16 NOTE — Telephone Encounter (Signed)
I spoke to husband, I don't know what else to do for patient. She has significantly progressed, rapidly, I recommended she go to [facility], if she is progressing that rapidly she needs admission to [facility] for thorough imaging. I am ordering stat MRI brain/cervical spine/thoracic spine but cannot get it quickly in outpatient.  I had referred her to [facility] but they have no scheduled Hinton Dyer can you call [facility] and see why they didn't get it?). Cancel apppointment tomorrow and we will try to expedite images.

## 2020-07-16 NOTE — Telephone Encounter (Signed)
I called the pt and LVM (ok per DPR) advising the MRIs that were ordered would show if there were concerns in the spine however it would not be testing for intestinal concerns and Dr Jaynee Eagles does not order that testing. Would need to be done by primary care or ER. Again advised to go to ER if the patient has declined. Left office number for call back if needed.

## 2020-07-16 NOTE — Telephone Encounter (Signed)
Pt.'s husband Margaret Vaughn called back to ask if Dr. can order a test to rule out the possibility of cancer. He states if the test could be done to at least determine if she does or does not have cancer in her intestines or spine. Please advise.

## 2020-07-16 NOTE — Telephone Encounter (Signed)
medicaid healthy blue pending

## 2020-07-16 NOTE — Telephone Encounter (Signed)
Spoke with Dr Jaynee Eagles regarding pt's husband's request to test for cancer in intestines and spine. She cannot test the intestines. The MRI will show if there are concerns in the spine. Again, if the patient has declined she needs to go to the ER.

## 2020-07-16 NOTE — Telephone Encounter (Signed)
[place] Patient is scheduled with Dr. Buck Mam , [facility]  02/22/2020 at 3:40 . This is First opening that they have . If sooner apt is needed PAL Line can be called at [phone] press opt # 5 .  I have called patient and left her a detailed message relaying that [facility] will send her e-mail for sooner apt's and CX's but she needed to register , also she could call [facility] .   Patient is a previous patient of [facility] last seen 2019 .  [facility] center Dr. Almyra Deforest Seen for Numbness and tingling.

## 2020-07-16 NOTE — Telephone Encounter (Signed)
Dr. Edwyna Ready didn't Sent her to [facility] Me looking back . You Did send her to Dr. Nelva Bush in October . Calling Dr. Nelva Bush to see about apt. Thanks Hinton Dyer . Dr. Jaynee Eagles If you put in Referral for [facility] I will be glad to send But [facility] I cant get her asap right now unless you call PAL line because of the Holidays . Please advise .

## 2020-07-17 ENCOUNTER — Encounter: Payer: Self-pay | Admitting: Family Medicine

## 2020-07-17 ENCOUNTER — Ambulatory Visit: Payer: Medicaid Other | Admitting: Neurology

## 2020-07-17 NOTE — Telephone Encounter (Signed)
Effexor was D/C'd in April. Please deny and review inhaler script

## 2020-07-18 ENCOUNTER — Other Ambulatory Visit: Payer: Self-pay

## 2020-07-18 ENCOUNTER — Emergency Department (HOSPITAL_COMMUNITY)
Admission: EM | Admit: 2020-07-18 | Discharge: 2020-07-19 | Disposition: A | Discharge status: Home / Self Care | Payer: Medicaid Other | Attending: Emergency Medicine | Admitting: Emergency Medicine

## 2020-07-18 ENCOUNTER — Telehealth: Payer: Self-pay | Admitting: Neurology

## 2020-07-18 ENCOUNTER — Emergency Department (HOSPITAL_COMMUNITY): Payer: Medicaid Other

## 2020-07-18 ENCOUNTER — Encounter (HOSPITAL_COMMUNITY): Payer: Self-pay | Admitting: Emergency Medicine

## 2020-07-18 DIAGNOSIS — G950 Syringomyelia and syringobulbia: Secondary | ICD-10-CM | POA: Diagnosis not present

## 2020-07-18 DIAGNOSIS — J449 Chronic obstructive pulmonary disease, unspecified: Secondary | ICD-10-CM | POA: Insufficient documentation

## 2020-07-18 DIAGNOSIS — J45909 Unspecified asthma, uncomplicated: Secondary | ICD-10-CM | POA: Insufficient documentation

## 2020-07-18 DIAGNOSIS — Z79899 Other long term (current) drug therapy: Secondary | ICD-10-CM | POA: Diagnosis not present

## 2020-07-18 DIAGNOSIS — E1169 Type 2 diabetes mellitus with other specified complication: Secondary | ICD-10-CM | POA: Diagnosis not present

## 2020-07-18 DIAGNOSIS — R531 Weakness: Secondary | ICD-10-CM | POA: Diagnosis not present

## 2020-07-18 DIAGNOSIS — R9431 Abnormal electrocardiogram [ECG] [EKG]: Secondary | ICD-10-CM | POA: Diagnosis not present

## 2020-07-18 DIAGNOSIS — F17210 Nicotine dependence, cigarettes, uncomplicated: Secondary | ICD-10-CM | POA: Diagnosis not present

## 2020-07-18 DIAGNOSIS — E785 Hyperlipidemia, unspecified: Secondary | ICD-10-CM | POA: Insufficient documentation

## 2020-07-18 DIAGNOSIS — I10 Essential (primary) hypertension: Secondary | ICD-10-CM | POA: Insufficient documentation

## 2020-07-18 DIAGNOSIS — Z7984 Long term (current) use of oral hypoglycemic drugs: Secondary | ICD-10-CM | POA: Insufficient documentation

## 2020-07-18 DIAGNOSIS — M6281 Muscle weakness (generalized): Secondary | ICD-10-CM | POA: Insufficient documentation

## 2020-07-18 DIAGNOSIS — Z20822 Contact with and (suspected) exposure to covid-19: Secondary | ICD-10-CM | POA: Diagnosis not present

## 2020-07-18 LAB — LACTIC ACID, PLASMA: Lactic Acid, Venous: 1.4 mmol/L (ref 0.5–1.9)

## 2020-07-18 LAB — COMPREHENSIVE METABOLIC PANEL
ALT: 23 U/L (ref 0–44)
AST: 22 U/L (ref 15–41)
Albumin: 3.8 g/dL (ref 3.5–5.0)
Alkaline Phosphatase: 112 U/L (ref 38–126)
Anion gap: 14 (ref 5–15)
BUN: 8 mg/dL (ref 6–20)
CO2: 28 mmol/L (ref 22–32)
Calcium: 9.6 mg/dL (ref 8.9–10.3)
Chloride: 94 mmol/L — ABNORMAL LOW (ref 98–111)
Creatinine, Ser: 0.53 mg/dL (ref 0.44–1.00)
GFR, Estimated: >60 mL/min (ref >60)
Glucose, Bld: 110 mg/dL — ABNORMAL HIGH (ref 70–99)
Potassium: 4.2 mmol/L (ref 3.5–5.1)
Sodium: 136 mmol/L (ref 135–145)
Total Bilirubin: 0.5 mg/dL (ref 0.3–1.2)
Total Protein: 7.7 g/dL (ref 6.5–8.1)

## 2020-07-18 LAB — RESP PANEL BY RT-PCR (FLU A&B, COVID) ARPGX2
Influenza A by PCR: NEGATIVE
Influenza B by PCR: NEGATIVE
SARS Coronavirus 2 by RT PCR: NEGATIVE

## 2020-07-18 LAB — CBC WITH DIFFERENTIAL/PLATELET
Abs Immature Granulocytes: 0.04 10*3/uL (ref 0.00–0.07)
Basophils Absolute: 0 10*3/uL (ref 0.0–0.1)
Basophils Relative: 0 %
Eosinophils Absolute: 0.1 10*3/uL (ref 0.0–0.5)
Eosinophils Relative: 1 %
HCT: 53.5 % — ABNORMAL HIGH (ref 36.0–46.0)
Hemoglobin: 17 g/dL — ABNORMAL HIGH (ref 12.0–15.0)
Immature Granulocytes: 0 %
Lymphocytes Relative: 11 %
Lymphs Abs: 1.1 10*3/uL (ref 0.7–4.0)
MCH: 29.1 pg (ref 26.0–34.0)
MCHC: 31.8 g/dL (ref 30.0–36.0)
MCV: 91.6 fL (ref 80.0–100.0)
Monocytes Absolute: 0.7 10*3/uL (ref 0.1–1.0)
Monocytes Relative: 7 %
Neutro Abs: 8.6 10*3/uL — ABNORMAL HIGH (ref 1.7–7.7)
Neutrophils Relative %: 81 %
Platelets: 304 10*3/uL (ref 150–400)
RBC: 5.84 MIL/uL — ABNORMAL HIGH (ref 3.87–5.11)
RDW: 15.4 % (ref 11.5–15.5)
WBC: 10.7 10*3/uL — ABNORMAL HIGH (ref 4.0–10.5)
nRBC: 0 % (ref 0.0–0.2)

## 2020-07-18 LAB — URINALYSIS, ROUTINE W REFLEX MICROSCOPIC
Bilirubin Urine: NEGATIVE
Glucose, UA: NEGATIVE mg/dL
Hgb urine dipstick: NEGATIVE
Ketones, ur: NEGATIVE mg/dL
Leukocytes,Ua: NEGATIVE
Nitrite: NEGATIVE
Protein, ur: NEGATIVE mg/dL
Specific Gravity, Urine: 1.005 (ref 1.005–1.030)
pH: 8 (ref 5.0–8.0)

## 2020-07-18 MED ORDER — SODIUM CHLORIDE 0.9 % IV BOLUS
500.0000 mL | Freq: Once | INTRAVENOUS | Status: AC
  Administered 2020-07-18: 500 mL via INTRAVENOUS

## 2020-07-18 MED ORDER — FLUTICASONE-SALMETEROL 100-50 MCG/DOSE IN AEPB: 1.0000 | INHALATION_SPRAY | Freq: Two times a day (BID) | RESPIRATORY_TRACT | 2 refills | Status: DC

## 2020-07-18 MED ORDER — MAGNESIUM SULFATE 2 GM/50ML IV SOLN
2.0000 g | Freq: Once | INTRAVENOUS | Status: AC
  Administered 2020-07-18: 2 g via INTRAVENOUS
  Filled 2020-07-18: qty 50

## 2020-07-18 MED ORDER — ALBUTEROL SULFATE HFA 108 (90 BASE) MCG/ACT IN AERS
2.0000 | INHALATION_SPRAY | Freq: Once | RESPIRATORY_TRACT | Status: AC
  Administered 2020-07-18: 2 via RESPIRATORY_TRACT
  Filled 2020-07-18: qty 6.7

## 2020-07-18 MED ORDER — METHYLPREDNISOLONE SODIUM SUCC 125 MG IJ SOLR
125.0000 mg | Freq: Once | INTRAMUSCULAR | Status: AC
  Administered 2020-07-18: 125 mg via INTRAVENOUS
  Filled 2020-07-18: qty 2

## 2020-07-18 NOTE — Telephone Encounter (Signed)
07/16/20 medicaid healthy blue pending EE -- 07/16/20 medicaid healthy blue pending EE -07-18-2020 Ref# [id number] Telephone [phone] . Fax [phone] . Eliezer Lofts B submitted a Faxed More Records Sending More records I will follow up 07/22/2020 Jayme Cloud

## 2020-07-18 NOTE — ED Triage Notes (Signed)
Pt to ED with c/o weakness for the last several days.  Pt states her weakness is getting progressively worse.

## 2020-07-18 NOTE — ED Provider Notes (Signed)
[facility] EMERGENCY DEPARTMENT Provider Note   CSN: [id number] Arrival date & time: 07/18/20  2120     History Chief Complaint  Patient presents with  . Weakness    Margaret Vaughn is a 59 y.o. female.  Patient presents with weakness in her upper and lower extremities worse in her lower extremities.  Patient has progressively gotten worse over the last few weeks.  She sees a neurologist and has spinal stenosis in her lumbar area along with syringomyelia.  She spoke to the neurologist a couple days ago and he stated her to go to the hospital to be admitted and that she needed an MRI of her head cervical spine and thoracic spine  The history is provided by the patient.  Weakness Severity:  Moderate Onset quality:  Sudden Timing:  Constant Progression:  Worsening Chronicity:  New Context: not alcohol use   Relieved by:  Nothing Associated symptoms: no abdominal pain, no chest pain, no cough, no diarrhea, no frequency, no headaches and no seizures        Past Medical History:  Diagnosis Date  . Acid reflux   . Allergies   . Asthma   . Bronchitis   . Cervical stenosis of spine   . Chronic pain syndrome   . COPD (chronic obstructive pulmonary disease) ([place])   . DDD (degenerative disc disease), lumbar   . Depression with anxiety   . Diabetes mellitus without complication ([place])   . Fatigue   . H/O blood clots   . Headache(784.0)   . Hemorrhoids, external   . HTN (hypertension)   . Hyperlipidemia   . Hypoxemia   . Insomnia   . Memory loss   . Myalgia and myositis   . Narcolepsy   . OSA (obstructive sleep apnea)   . Sleeping difficulty   . 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[place])   . Thoracalgia   . Tobacco abuse   . Tremor   . Ulcer disease     Patient Active Problem List   Diagnosis Date Noted  . Fibromyalgia 01/06/2019  . Neuropathy 01/06/2019  . Overweight 03/11/2018  . COPD (chronic obstructive pulmonary disease) ([place]) 03/11/2018  . Type 2 diabetes mellitus with other  specified complication ([place]) 99991111  . PTSD (post-traumatic stress disorder) 03/11/2018  . GAD (generalized anxiety disorder) 01/30/2014  . Essential hypertension, benign 01/30/2014  . Hyperlipidemia 08/10/2012  . Tobacco abuse 08/10/2012  . GERD 12/24/2009    Past Surgical History:  Procedure Laterality Date  . ABDOMINAL HYSTERECTOMY    . CHOLECYSTECTOMY    . OOPHORECTOMY    . tonsillectomy       OB History   No obstetric history on file.     Family History  Problem Relation Age of Onset  . Ulcers Mother   . Sleep apnea Mother   . Migraines Mother   . Cancer Mother        uterine  . Arthritis Mother   . Hypertension Mother   . Heart disease Mother   . Depression Mother   . Hyperlipidemia Mother   . Colon cancer Mother   . Arrhythmia Son     Social History   Tobacco Use  . Smoking status: Current Every Day Smoker    Packs/day: 2.00    Years: 30.00    Pack years: 60.00    Types: Cigarettes  . Smokeless tobacco: Never Used  Vaping Use  . Vaping Use: Never used  Substance Use Topics  . Alcohol use: No  .  Drug use: No    Home Medications Prior to Admission medications   Medication Sig Start Date End Date Taking? Authorizing Provider  albuterol (VENTOLIN HFA) 108 (90 Base) MCG/ACT inhaler TAKE 2 PUFFS BY MOUTH EVERY 6 HOURS AS NEEDED FOR WHEEZE OR SHORTNESS OF BREATH 03/27/20   Dettinger, Fransisca Kaufmann, MD  atorvastatin (LIPITOR) 40 MG tablet Take 1 tablet (40 mg total) by mouth daily. 06/26/20   Dettinger, Fransisca Kaufmann, MD  cyclobenzaprine (FLEXERIL) 10 MG tablet TAKE 1 TABLET BY MOUTH THREE TIMES A DAY AS NEEDED FOR MUSCLE SPASMS 06/26/20   Dettinger, Fransisca Kaufmann, MD  enalapril (VASOTEC) 5 MG tablet Take 1 tablet (5 mg total) by mouth daily. 06/26/20   Dettinger, Fransisca Kaufmann, MD  Fluticasone-Salmeterol (ADVAIR DISKUS) 100-50 MCG/DOSE AEPB Inhale 1 puff into the lungs 2 (two) times daily. 07/18/20   Dettinger, Fransisca Kaufmann, MD  Galcanezumab-gnlm (EMGALITY) 120 MG/ML SOAJ Inject  120 mg into the skin every 30 (thirty) days. 03/13/20   Melvenia Beam, MD  Melatonin 300 MCG TABS Take 1 tablet by mouth at bedtime.    [provider]  Multiple Vitamins-Minerals (HAIR SKIN NAILS PO) Take 3 tablets by mouth daily.    [provider]  Multiple Vitamins-Minerals (MULTIVITAMIN WITH MINERALS) tablet Take 1 tablet by mouth daily.    [provider]  Omega-3 Fatty Acids (FISH OIL) 1000 MG CAPS Take 2 capsules by mouth at bedtime.    [provider]  oxyCODONE (OXYCONTIN) 20 mg 12 hr tablet Take 1 tablet (20 mg total) by mouth every 12 (twelve) hours. 06/26/20   Dettinger, Fransisca Kaufmann, MD  oxyCODONE (OXYCONTIN) 20 mg 12 hr tablet Take 1 tablet (20 mg total) by mouth every 12 (twelve) hours. 07/26/20   Dettinger, Fransisca Kaufmann, MD  pregabalin (LYRICA) 150 MG capsule Take 1 capsule (150 mg total) by mouth 3 (three) times daily. 06/26/20   Dettinger, Fransisca Kaufmann, MD  Probiotic Product (PROBIOTIC-10) CAPS Take 1 tablet by mouth daily.    [provider]  rizatriptan (MAXALT-MLT) 10 MG disintegrating tablet Take 1 tablet (10 mg total) by mouth as needed for migraine. May repeat in 2 hours if needed 03/13/20   Melvenia Beam, MD  sitaGLIPtin-metformin (JANUMET) 50-1000 MG tablet Take 1 tablet by mouth 2 (two) times daily with a meal. 06/26/20   Dettinger, Fransisca Kaufmann, MD  tiotropium (SPIRIVA HANDIHALER) 18 MCG inhalation capsule INHALE CONTENTS OF 1 CAPSULE VIA HANDIHALER ONCE DAILY AT THE SAME TIME EVERY DAY 06/26/20   Dettinger, Fransisca Kaufmann, MD  VITAMIN D, ERGOCALCIFEROL, PO Take 1 capsule by mouth daily.    [provider]    Allergies    Iohexol, Moxifloxacin, and Shellfish allergy  Review of Systems   Review of Systems  Constitutional: Negative for appetite change and fatigue.  HENT: Negative for congestion, ear discharge and sinus pressure.   Eyes: Negative for discharge.  Respiratory: Negative for cough.   Cardiovascular: Negative for chest  pain.  Gastrointestinal: Negative for abdominal pain and diarrhea.  Genitourinary: Negative for frequency and hematuria.  Musculoskeletal: Negative for back pain.  Skin: Negative for rash.  Neurological: Positive for weakness. Negative for seizures and headaches.       LowProgressive weakness in the lower extremities with mild numbness in her upper extremities  Psychiatric/Behavioral: Negative for hallucinations.    Physical Exam Updated Vital Signs BP (!) 143/92   Pulse (!) 106   Temp 98.5 F (36.9 C)   Resp (!) 22  Ht 5\' 4"  (1.626 m)   Wt 68 kg   SpO2 92%   BMI 25.75 kg/m   Physical Exam Vitals and nursing note reviewed.  Constitutional:      Appearance: She is well-developed.  HENT:     Head: Normocephalic.     Nose: Nose normal.  Eyes:     General: No scleral icterus.    Extraocular Movements: EOM normal.     Conjunctiva/sclera: Conjunctivae normal.  Neck:     Thyroid: No thyromegaly.  Cardiovascular:     Rate and Rhythm: Normal rate and regular rhythm.     Heart sounds: No murmur heard. No friction rub. No gallop.   Pulmonary:     Breath sounds: No stridor. No wheezing or rales.  Chest:     Chest wall: No tenderness.  Abdominal:     General: There is no distension.     Tenderness: There is no abdominal tenderness. There is no rebound.  Genitourinary:    Comments: Rectal exam normal Musculoskeletal:        General: No edema.     Cervical back: Neck supple.     Comments: Patient has significant weakness in her lower extremities and can barely lift them off the bed  Lymphadenopathy:     Cervical: No cervical adenopathy.  Skin:    Findings: No erythema or rash.  Neurological:     Mental Status: She is alert and oriented to person, place, and time.     Motor: No abnormal muscle tone.     Coordination: Coordination normal.     Comments: Numbness in her hands with significant weakness in her lower extremities  Psychiatric:        Mood and Affect: Mood and  affect normal.        Behavior: Behavior normal.     ED Results / Procedures / Treatments   Labs (all labs ordered are listed, but only abnormal results are displayed) Labs Reviewed  CBC WITH DIFFERENTIAL/PLATELET - Abnormal; Notable for the following components:      Result Value   WBC 10.7 (*)    RBC 5.84 (*)    Hemoglobin 17.0 (*)    HCT 53.5 (*)    Neutro Abs 8.6 (*)    All other components within normal limits  COMPREHENSIVE METABOLIC PANEL - Abnormal; Notable for the following components:   Chloride 94 (*)    Glucose, Bld 110 (*)    All other components within normal limits  RESP PANEL BY RT-PCR (FLU A&B, COVID) ARPGX2  LACTIC ACID, PLASMA  URINALYSIS, ROUTINE W REFLEX MICROSCOPIC    EKG None  Radiology DG Chest Port 1 View  Result Date: 07/18/2020 CLINICAL DATA:  Worsening weakness for several days, tobacco abuse EXAM: PORTABLE CHEST 1 VIEW COMPARISON:  06/26/2017 FINDINGS: The heart size and mediastinal contours are within normal limits. Both lungs are clear. The visualized skeletal structures are unremarkable. IMPRESSION: No active disease. Electronically Signed   By: Randa Ngo M.D.   On: 07/18/2020 22:01    Procedures Procedures (including critical care time)  Medications Ordered in ED Medications  sodium chloride 0.9 % bolus 500 mL (0 mLs Intravenous Stopped 07/18/20 2314)  albuterol (VENTOLIN HFA) 108 (90 Base) MCG/ACT inhaler 2 puff (2 puffs Inhalation Given 07/18/20 2215)  methylPREDNISolone sodium succinate (SOLU-MEDROL) 125 mg/2 mL injection 125 mg (125 mg Intravenous Given 07/18/20 2217)  magnesium sulfate IVPB 2 g 50 mL (0 g Intravenous Stopped 07/18/20 2333)    ED Course  I have reviewed the triage vital signs and the nursing notes.  Pertinent labs & imaging results that were available during my care of the patient were reviewed by me and considered in my medical decision making (see chart for details).    MDM Rules/Calculators/A&P                           Patient with a history of spinal stenosis and syringomyelia.  Patient having increasing weakness in her lower extremities.  Patient has been told by neurology that she need to come to the hospital and get an MRI of her head cervical spine and thoracic spine.  She will be admitted to medicine with neurology consult Final Clinical Impression(s) / ED Diagnoses Final diagnoses:  Weakness    Rx / DC Orders ED Discharge Orders    None       Milton Ferguson, MD 07/18/20 2346

## 2020-07-19 DIAGNOSIS — G950 Syringomyelia and syringobulbia: Secondary | ICD-10-CM

## 2020-07-19 MED ORDER — SODIUM CHLORIDE 0.9 % IV BOLUS
1000.0000 mL | Freq: Once | INTRAVENOUS | Status: AC
  Administered 2020-07-19: 1000 mL via INTRAVENOUS

## 2020-07-19 MED ORDER — LORAZEPAM 2 MG/ML IJ SOLN
1.0000 mg | Freq: Once | INTRAMUSCULAR | Status: AC
  Administered 2020-07-19: 1 mg via INTRAVENOUS
  Filled 2020-07-19: qty 1

## 2020-07-19 NOTE — ED Notes (Signed)
Dr. Olevia Bowens paged, he returned call. Pt reports she is having severe muscle spasms, no PRNs ordered at this time. Pt reports she wants to leave AMA. Dr. Olevia Bowens to come see patient, no new orders at this time.

## 2020-07-19 NOTE — ED Notes (Signed)
Dr Ortiz at bedside 

## 2020-07-19 NOTE — ED Notes (Signed)
Patient discharged to home.  All discharge instructions reviewed.  Patient verbalized understanding via teachback method.  Wheelchair out of ED. 

## 2020-07-19 NOTE — ED Provider Notes (Signed)
I was informed by admitting hospitalist that the patient does not want to be admitted. She prefers to get some fluids and go home. I discussed with them. Didn't seem that this necessarily an emergent reason to be admitted and more of an exacerbation of chronic issue already being worked up as an outpatient. Thus, it was decided patient to be discharged to care of family.    Ezabella Teska, [name], MD 07/19/20 [phone]

## 2020-07-19 NOTE — Progress Notes (Signed)
TRH night shift signoff note.  I went to see the patient after being consulted for admission due to weakness secondary to syringomyelia.  She was recently seen by [facility] (Dr. Sarina Ill) who referred her to [facility], but also told her that she needed to go to the ED to get MRI of her head and spine along with neurology evaluation as soon as possible.  We do not have neurology service or MRI capability until Monday.  I offered the patient and her husband to transfer her to [facility] to [facility] so this can be done today.  They were not very agreeable to that idea and I offered to discuss the possibility to go to [facility] if she does not want to go to [place].  However, they were not interested in doing this at this time since the patient wants to spend Christmas at home.  They also requested from me MRI orders to be done on Monday here at [facility], but I told them that I was unable to do that request as I am not a neurologist, do not provide primary care services or even have an office to follow them with those results.  They requested IV hydration and medication for her muscle spasms after her husband stated that she came to the hospital due to having 4 large bowel movements yesterday evening and not due to her weakness.  I ordered lorazepam 1 mg IVP and a 1000 mL NS bolus.  I also told them that she would have to sign herself AMA.  I made Dr. Dayna Barker aware that the patient did not want to be admitted.  Tennis Must, MD.

## 2020-07-22 ENCOUNTER — Telehealth: Payer: Self-pay

## 2020-07-22 ENCOUNTER — Ambulatory Visit: Payer: Medicaid Other | Admitting: Family Medicine

## 2020-07-22 NOTE — Telephone Encounter (Signed)
Patient e-mailed me copy of signed appeal form. I faxed this, along with most recent office visit notes, NCV/EMG results, and most recent (04/2020) imaging results from Dr. Lucia Gaskins. Faxed to [facility] [phone]).

## 2020-07-22 NOTE — Telephone Encounter (Signed)
[employer] denied request for imaging. I called the patient and got e-mail address to send appeal form. Patient understands that she needs to sign this. Appeal form has been sent to patient.

## 2020-07-22 NOTE — Telephone Encounter (Signed)
Transition Care Management Follow-up Telephone Call  Date of discharge and from where: 07/18/2020 from [facility]  How have you been since you were released from the hospital? Patient is still feeling weak and the weakness is worsening.   Any questions or concerns? No  Items Reviewed:  Did the pt receive and understand the discharge instructions provided? Yes   Medications obtained and verified? Yes   Other? No   Any new allergies since your discharge? No   Dietary orders reviewed? N/A  Do you have support at home? Yes   Functional Questionnaire: (I = Independent and D = Dependent) ADLs: Dependent due to weakness  Bathing/Dressing- Dependent due to weakness  Meal Prep- Dependent due to weakness  Eating- I  Maintaining continence- I  Transferring/Ambulation- Dependent due to weakness  Managing Meds- I  Follow up appointments reviewed:   PCP Hospital f/u appt confirmed? Yes  Scheduled to see Arville Care, MD on 08/30/2020 @ 1:55pm.  Specialist Hospital f/u appt confirmed? No  Waiting on MRI results.   Are transportation arrangements needed? No   If their condition worsens, is the pt aware to call PCP or go to the Emergency Dept.? Yes  Was the patient provided with contact information for the PCP's office or ED? Yes  Was to pt encouraged to call back with questions or concerns? Yes    Sent message to [facility] for some information and assist with the paitent imaging order:   [9:48 AM] Reuel Boom Those are hard to do, but we'll usually call [facility] to see if they can work them in. or you may be able to get them in at one of the hospitals outpatient imaging. their number is [phone]. Sometimes [facility] will be able get people in pretty quickly. Their number is [phone] option 5. Her insurance will need approval also   This is the referral information for Neurology   Type Date User Summary Attachment  General 07/16/2020 3:13 PM Cox,  Christ Kick - -  Note   [facility] Patient is scheduled with Dr. Evern Bio , [facility]  02/22/2020 at 3:40 . This is First opening that they have . If sooner apt is needed PAL Line can be called at [phone] press opt # 5 .  I have called patient and left her a detailed message relaying that [facility] will send her e-mail for sooner apt's and CX's but she needed to register , also she could call [facility] .   Patient is a previous patient of NP Aimee kerns [facility] last seen 2019 .  [facility] Dr. Gregor Hams Seen for Numbness and tingling.

## 2020-07-22 NOTE — Telephone Encounter (Addendum)
Checked Availity Portal. Patient was denied for services. Denial based on insurance needing copies of scans and results. I will begin the appeal and send this information in. Appeal must be started by 09/16/20. Patient will need to sign the appeal form regardless if appeal is done by phone, fax or mail. I called the patient and LVM to advise that her signature will be needed on this form. Advised I could fax this form to her or she can come to the office and sign it. Requested she return my call.

## 2020-07-23 ENCOUNTER — Encounter: Payer: Self-pay | Admitting: Neurology

## 2020-07-24 ENCOUNTER — Telehealth: Payer: Self-pay | Admitting: Neurology

## 2020-07-24 NOTE — Telephone Encounter (Signed)
Spoke to Margaret Vaughn from [employer] and he is going to call me back if he needs anything else [place] . For MRI

## 2020-07-24 NOTE — Telephone Encounter (Signed)
Barbara Cower with [employer] called today to follow up on a recent denial for an MRI .

## 2020-07-27 DIAGNOSIS — R0902 Hypoxemia: Secondary | ICD-10-CM | POA: Diagnosis not present

## 2020-07-29 NOTE — Telephone Encounter (Addendum)
Barbara Cower Nurse Case Mgr. States if you resubmit patient's MRI as Urgent and change Location to [facility] in [place] . This should help with approval .   He said you can call him if you need to with any questions . [phone]

## 2020-07-29 NOTE — Telephone Encounter (Signed)
While I was out of the office last week her insurance denied the MRI right now it is in an appeal process.

## 2020-07-30 NOTE — Telephone Encounter (Signed)
Noted, I called and spoke with Barbara Cower he informed me that I need to start a new case on the availity portal and to have the location for [facility]. He stated that is the reason it was not approved because of the location. He also stated to mark it urgent that was it won't take up to 14 days for it to be reviewed. I should hear sooner than that since I marked it urgent.

## 2020-07-31 NOTE — Telephone Encounter (Signed)
Patient husband Freida Busman called me and stated that Barbara Cower informed him that if we changed the location to [facility] that everything will be okay. I informed Freida Busman when I spoke to [place] yesterday I did exactly everything what he told me to do but I still got a letter saying that it was denied and that I would have to wait 90 days to try again. I informed Freida Busman that I spoke with Barbara Cower again this morning and gave him that information and Barbara Cower informed me that he was going to have to call someone to figure it out. I did inform all of this to [facility]. He stated he was going to call Barbara Cower too.

## 2020-07-31 NOTE — Telephone Encounter (Signed)
I got a letter from medicaid healthy blue stating "request has been voided as member has a recent denial for CPT codes  49753, [id number] & [phone] on 07/24/20 by the medical director. Medicaid rules states must wait 90 days before requesting same services."  I called Barbara Cower and spoke with him since he is the one telling the steps to take and he states that he is going to reach out to the [facility] and see what can be done and he states he will give me a call back and I gave him my direct number.

## 2020-08-01 NOTE — Telephone Encounter (Signed)
I spoke with Barbara Cower with [employer] and told him about the address situation it not being the same exact address as the hospital that she is going to have it at. He told me to contact [facility] and go ahead and schedule it and ask them about the address situation and if it is a problem he states to call him back and he will figure it out..  I called [facility] and I spoke with Melissa I told her that the address had [facility] but it has the HR address on it and not the actual hospital she will have the MRI at.. she stated that should not be a problem but I had to fax the order and information to them now and they will contact the patient.  I did fax the order and they will reach out to the patient to schedule.

## 2020-08-01 NOTE — Telephone Encounter (Signed)
Also, when the patient husband Margaret Vaughn called me yesterday he stated that she will need some sort of sedation  to help her. I told him that Dr. Lucia Gaskins could order a Xanix to help her.. I don't know when she is scheduled yet.

## 2020-08-01 NOTE — Telephone Encounter (Signed)
Noted thank you

## 2020-08-01 NOTE — Telephone Encounter (Signed)
Patient husband Freida Busman called me asking me if I have sent the order to [facility]. I informed him that I did send the order and per their protocol I fax the order and they are the ones to reach out to the patient to schedule. He stated that he wanted to make sure it was clear to them that this needs to be urgent.. I told him I would contact [facility] and remind them that this is urgent and to schedule as soon as possible..  I spoke with Melissa at [facility] again. She stated she did receive my fax. I told her that this is urgent. She stated that they would not be able to do all three MRI's on the same day and that they would have to be broken up.. she also informed me that she has left a voicemail on the patient phone. I informed her to contact her husband Freida Busman he has been the main one dealing with this.   Melissa with [facility] just called me back again and informed me that she also left a voicemail on her husband phone Allen.

## 2020-08-01 NOTE — Telephone Encounter (Signed)
See telephone note for 07/24/20. All the information is in that one.

## 2020-08-01 NOTE — Telephone Encounter (Signed)
Medicaid healthy blue Berkley [place]: [id number] (exp. 07/30/20 to 08/30/20)

## 2020-08-02 DIAGNOSIS — G894 Chronic pain syndrome: Secondary | ICD-10-CM | POA: Diagnosis not present

## 2020-08-02 DIAGNOSIS — Z79899 Other long term (current) drug therapy: Secondary | ICD-10-CM | POA: Diagnosis not present

## 2020-08-02 DIAGNOSIS — M62838 Other muscle spasm: Secondary | ICD-10-CM | POA: Diagnosis not present

## 2020-08-05 ENCOUNTER — Other Ambulatory Visit: Payer: Self-pay | Admitting: Neurology

## 2020-08-05 ENCOUNTER — Telehealth: Payer: Self-pay | Admitting: Neurology

## 2020-08-05 MED ORDER — ALPRAZOLAM 0.25 MG PO TABS: ORAL_TABLET | ORAL | 0 refills | Status: DC

## 2020-08-05 NOTE — Telephone Encounter (Signed)
Spoke with pt and we discussed the Xanax prescription that was sent to her pharmacy for both tomorrow's MRI and Friday's. Pt aware of the instructions. She will have a driver. Pt's questions were answered. She still reports she has worsened and has less mobility in arms and legs. She was very appreciative of the assistance and I advised her that if she is continuing to worsen she should go to the ER. Pt stated Dr Jaynee Eagles is not to blame as she "got the ball at the last yard line". She was very appreciative for the call and she is aware we will be on the lookout for the MRI results.

## 2020-08-05 NOTE — Telephone Encounter (Signed)
Pt is having MRI done tomorrow at [facility] at 8:30am and is needing a sedative for her claustrophobia. Pt is also having two more MRI's done on Friday at 10:30am and is needing some called in for that day as well. [facility] [address]. Husband would like to be called once this is called in for the pt.

## 2020-08-05 NOTE — Telephone Encounter (Signed)
The MRI Brain is scheduled on 08/07/19 at 8:30 AM and the MR Cervical spine and Thoracic spine is scheduled on 08/10/19 at 10:30 AM. Patient husband stated that she will need something to help her to get through both of these appointments. [facility] [place]

## 2020-08-05 NOTE — Telephone Encounter (Signed)
Prescribed, thanks!

## 2020-08-06 NOTE — Telephone Encounter (Addendum)
This is a chronic issue with acute worsening, again have recommended ED for acute imaging. The office is short staffed due to Covid. He will have to wait until the MRI at [facility] can take them, we will not change the location for them again because it will take longer to change the location and get the MRI rescheduled than it would take to wait a day or two for [facility]. Again, I urge the ED to get these images stat.

## 2020-08-06 NOTE — Telephone Encounter (Signed)
Noted  

## 2020-08-06 NOTE — Telephone Encounter (Signed)
The patient husband Margaret Vaughn called me and left me a voicemail on my phone and stated that [facility] would not be able to do the MRI at the moment because they were having problems with their machines. He stated that he was wondering if she could go to [facility] today for the MRI. I called him back and I informed him that I would not be able to get her MRI scheduled at [place] today because the process to change the location with her insurance is a huge pain. Which he is aware of since it was a pain to change the location to [facility]. He then stated that we will leave it as it is for now but that he called [name] with [employer] and is waiting on his call back. I also advised him just like what Romelle Starcher has advised the patient that if she is getting worse then the fastest way to get an MRI is to go to the ER. Patient husband Margaret Vaughn is aware.

## 2020-08-06 NOTE — Telephone Encounter (Signed)
See phone note from 08/05/20

## 2020-08-07 ENCOUNTER — Telehealth: Payer: Self-pay | Admitting: Neurology

## 2020-08-07 ENCOUNTER — Emergency Department (HOSPITAL_COMMUNITY): Payer: Medicaid Other | Admitting: Anesthesiology

## 2020-08-07 ENCOUNTER — Emergency Department (HOSPITAL_COMMUNITY): Payer: Medicaid Other

## 2020-08-07 ENCOUNTER — Telehealth: Payer: Self-pay | Admitting: *Deleted

## 2020-08-07 ENCOUNTER — Other Ambulatory Visit: Payer: Self-pay | Admitting: Neurology

## 2020-08-07 ENCOUNTER — Inpatient Hospital Stay (HOSPITAL_COMMUNITY)
Admission: EM | Admit: 2020-08-07 | Discharge: 2020-08-13 | DRG: 519 | Disposition: A | Discharge status: Rehabilitation Facility | Payer: Medicaid Other | Attending: Neurosurgery | Admitting: Neurosurgery

## 2020-08-07 ENCOUNTER — Other Ambulatory Visit: Payer: Self-pay

## 2020-08-07 ENCOUNTER — Encounter (HOSPITAL_COMMUNITY)
Admission: EM | Disposition: A | Discharge status: Rehabilitation Facility | Payer: Self-pay | Source: Home / Self Care | Attending: Neurosurgery

## 2020-08-07 DIAGNOSIS — G992 Myelopathy in diseases classified elsewhere: Secondary | ICD-10-CM

## 2020-08-07 DIAGNOSIS — E1169 Type 2 diabetes mellitus with other specified complication: Secondary | ICD-10-CM | POA: Diagnosis present

## 2020-08-07 DIAGNOSIS — E782 Mixed hyperlipidemia: Secondary | ICD-10-CM | POA: Diagnosis present

## 2020-08-07 DIAGNOSIS — M50221 Other cervical disc displacement at C4-C5 level: Secondary | ICD-10-CM | POA: Diagnosis not present

## 2020-08-07 DIAGNOSIS — G4733 Obstructive sleep apnea (adult) (pediatric): Secondary | ICD-10-CM | POA: Diagnosis not present

## 2020-08-07 DIAGNOSIS — T2102XA Burn of unspecified degree of abdominal wall, initial encounter: Secondary | ICD-10-CM | POA: Diagnosis present

## 2020-08-07 DIAGNOSIS — Z888 Allergy status to other drugs, medicaments and biological substances status: Secondary | ICD-10-CM | POA: Diagnosis not present

## 2020-08-07 DIAGNOSIS — M50223 Other cervical disc displacement at C6-C7 level: Secondary | ICD-10-CM | POA: Diagnosis not present

## 2020-08-07 DIAGNOSIS — F4310 Post-traumatic stress disorder, unspecified: Secondary | ICD-10-CM | POA: Diagnosis present

## 2020-08-07 DIAGNOSIS — M47812 Spondylosis without myelopathy or radiculopathy, cervical region: Secondary | ICD-10-CM | POA: Diagnosis not present

## 2020-08-07 DIAGNOSIS — M4692 Unspecified inflammatory spondylopathy, cervical region: Secondary | ICD-10-CM | POA: Diagnosis not present

## 2020-08-07 DIAGNOSIS — E785 Hyperlipidemia, unspecified: Secondary | ICD-10-CM | POA: Diagnosis present

## 2020-08-07 DIAGNOSIS — T2122XA Burn of second degree of abdominal wall, initial encounter: Secondary | ICD-10-CM | POA: Diagnosis present

## 2020-08-07 DIAGNOSIS — G9520 Unspecified cord compression: Secondary | ICD-10-CM | POA: Diagnosis not present

## 2020-08-07 DIAGNOSIS — M4712 Other spondylosis with myelopathy, cervical region: Secondary | ICD-10-CM | POA: Diagnosis not present

## 2020-08-07 DIAGNOSIS — F17210 Nicotine dependence, cigarettes, uncomplicated: Secondary | ICD-10-CM | POA: Diagnosis present

## 2020-08-07 DIAGNOSIS — Z20822 Contact with and (suspected) exposure to covid-19: Secondary | ICD-10-CM | POA: Diagnosis present

## 2020-08-07 DIAGNOSIS — J9611 Chronic respiratory failure with hypoxia: Secondary | ICD-10-CM | POA: Diagnosis not present

## 2020-08-07 DIAGNOSIS — R531 Weakness: Secondary | ICD-10-CM | POA: Diagnosis not present

## 2020-08-07 DIAGNOSIS — E663 Overweight: Secondary | ICD-10-CM | POA: Diagnosis present

## 2020-08-07 DIAGNOSIS — S199XXA Unspecified injury of neck, initial encounter: Secondary | ICD-10-CM | POA: Diagnosis not present

## 2020-08-07 DIAGNOSIS — Z91013 Allergy to seafood: Secondary | ICD-10-CM

## 2020-08-07 DIAGNOSIS — G4700 Insomnia, unspecified: Secondary | ICD-10-CM | POA: Diagnosis not present

## 2020-08-07 DIAGNOSIS — R270 Ataxia, unspecified: Secondary | ICD-10-CM | POA: Diagnosis not present

## 2020-08-07 DIAGNOSIS — J449 Chronic obstructive pulmonary disease, unspecified: Secondary | ICD-10-CM | POA: Diagnosis not present

## 2020-08-07 DIAGNOSIS — Z9981 Dependence on supplemental oxygen: Secondary | ICD-10-CM

## 2020-08-07 DIAGNOSIS — M5021 Other cervical disc displacement,  high cervical region: Secondary | ICD-10-CM | POA: Diagnosis not present

## 2020-08-07 DIAGNOSIS — F418 Other specified anxiety disorders: Secondary | ICD-10-CM | POA: Diagnosis not present

## 2020-08-07 DIAGNOSIS — E1142 Type 2 diabetes mellitus with diabetic polyneuropathy: Secondary | ICD-10-CM | POA: Diagnosis present

## 2020-08-07 DIAGNOSIS — R296 Repeated falls: Secondary | ICD-10-CM | POA: Diagnosis present

## 2020-08-07 DIAGNOSIS — Z79899 Other long term (current) drug therapy: Secondary | ICD-10-CM

## 2020-08-07 DIAGNOSIS — Y929 Unspecified place or not applicable: Secondary | ICD-10-CM

## 2020-08-07 DIAGNOSIS — I10 Essential (primary) hypertension: Secondary | ICD-10-CM | POA: Diagnosis present

## 2020-08-07 DIAGNOSIS — E1159 Type 2 diabetes mellitus with other circulatory complications: Secondary | ICD-10-CM | POA: Diagnosis present

## 2020-08-07 DIAGNOSIS — Z818 Family history of other mental and behavioral disorders: Secondary | ICD-10-CM

## 2020-08-07 DIAGNOSIS — Z8249 Family history of ischemic heart disease and other diseases of the circulatory system: Secondary | ICD-10-CM

## 2020-08-07 DIAGNOSIS — Z8261 Family history of arthritis: Secondary | ICD-10-CM

## 2020-08-07 DIAGNOSIS — R9389 Abnormal findings on diagnostic imaging of other specified body structures: Secondary | ICD-10-CM | POA: Diagnosis not present

## 2020-08-07 DIAGNOSIS — T2121XA Burn of second degree of chest wall, initial encounter: Secondary | ICD-10-CM | POA: Diagnosis present

## 2020-08-07 DIAGNOSIS — M4802 Spinal stenosis, cervical region: Secondary | ICD-10-CM | POA: Diagnosis present

## 2020-08-07 DIAGNOSIS — E1165 Type 2 diabetes mellitus with hyperglycemia: Secondary | ICD-10-CM | POA: Diagnosis present

## 2020-08-07 DIAGNOSIS — Z83438 Family history of other disorder of lipoprotein metabolism and other lipidemia: Secondary | ICD-10-CM

## 2020-08-07 DIAGNOSIS — G894 Chronic pain syndrome: Secondary | ICD-10-CM | POA: Diagnosis present

## 2020-08-07 HISTORY — PX: POSTERIOR CERVICAL LAMINECTOMY: SHX2248

## 2020-08-07 HISTORY — PX: POSTERIOR CERVICAL FUSION/FORAMINOTOMY: SHX5038

## 2020-08-07 LAB — CBC
HCT: 49.3 % — ABNORMAL HIGH (ref 36.0–46.0)
Hemoglobin: 15.7 g/dL — ABNORMAL HIGH (ref 12.0–15.0)
MCH: 29.2 pg (ref 26.0–34.0)
MCHC: 31.8 g/dL (ref 30.0–36.0)
MCV: 91.6 fL (ref 80.0–100.0)
Platelets: 284 10*3/uL (ref 150–400)
RBC: 5.38 MIL/uL — ABNORMAL HIGH (ref 3.87–5.11)
RDW: 14.4 % (ref 11.5–15.5)
WBC: 8.4 10*3/uL (ref 4.0–10.5)
nRBC: 0 % (ref 0.0–0.2)

## 2020-08-07 LAB — BASIC METABOLIC PANEL
Anion gap: 11 (ref 5–15)
BUN: 5 mg/dL — ABNORMAL LOW (ref 6–20)
CO2: 30 mmol/L (ref 22–32)
Calcium: 9.6 mg/dL (ref 8.9–10.3)
Chloride: 99 mmol/L (ref 98–111)
Creatinine, Ser: 0.53 mg/dL (ref 0.44–1.00)
GFR, Estimated: >60 mL/min (ref >60)
Glucose, Bld: 122 mg/dL — ABNORMAL HIGH (ref 70–99)
Potassium: 3.9 mmol/L (ref 3.5–5.1)
Sodium: 140 mmol/L (ref 135–145)

## 2020-08-07 LAB — RESP PANEL BY RT-PCR (FLU A&B, COVID) ARPGX2
Influenza A by PCR: NEGATIVE
Influenza B by PCR: NEGATIVE
SARS Coronavirus 2 by RT PCR: NEGATIVE

## 2020-08-07 SURGERY — POSTERIOR CERVICAL FUSION/FORAMINOTOMY LEVEL 1
Anesthesia: General | Site: Neck

## 2020-08-07 MED ORDER — ROCURONIUM 10MG/ML (10ML) SYRINGE FOR MEDFUSION PUMP - OPTIME
INTRAVENOUS | Status: DC | PRN
  Administered 2020-08-07: 40 mg via INTRAVENOUS
  Administered 2020-08-08: 20 mg via INTRAVENOUS

## 2020-08-07 MED ORDER — BACITRACIN ZINC 500 UNIT/GM EX OINT
TOPICAL_OINTMENT | CUTANEOUS | Status: AC
  Filled 2020-08-07: qty 28.35

## 2020-08-07 MED ORDER — MIDAZOLAM HCL 2 MG/2ML IJ SOLN
INTRAMUSCULAR | Status: DC | PRN
  Administered 2020-08-07: 2 mg via INTRAVENOUS

## 2020-08-07 MED ORDER — THROMBIN (RECOMBINANT) 5000 UNITS EX SOLR
CUTANEOUS | Status: AC
  Filled 2020-08-07: qty 5000

## 2020-08-07 MED ORDER — DEXAMETHASONE SODIUM PHOSPHATE 10 MG/ML IJ SOLN
10.0000 mg | Freq: Once | INTRAMUSCULAR | Status: AC
  Administered 2020-08-07: 10 mg via INTRAVENOUS
  Filled 2020-08-07: qty 1

## 2020-08-07 MED ORDER — CEFAZOLIN SODIUM-DEXTROSE 2-3 GM-%(50ML) IV SOLR
INTRAVENOUS | Status: DC | PRN
  Administered 2020-08-07: 2 g via INTRAVENOUS

## 2020-08-07 MED ORDER — THROMBIN (RECOMBINANT) 20000 UNITS EX SOLR
CUTANEOUS | Status: AC
  Filled 2020-08-07: qty 20000

## 2020-08-07 MED ORDER — PROPOFOL 10 MG/ML IV BOLUS
INTRAVENOUS | Status: DC | PRN
  Administered 2020-08-07: 150 mg via INTRAVENOUS

## 2020-08-07 MED ORDER — FENTANYL CITRATE (PF) 250 MCG/5ML IJ SOLN
INTRAMUSCULAR | Status: AC
  Filled 2020-08-07: qty 5

## 2020-08-07 MED ORDER — CEFAZOLIN SODIUM-DEXTROSE 2-4 GM/100ML-% IV SOLN
INTRAVENOUS | Status: AC
  Filled 2020-08-07: qty 100

## 2020-08-07 MED ORDER — FENTANYL CITRATE (PF) 250 MCG/5ML IJ SOLN
INTRAMUSCULAR | Status: DC | PRN
  Administered 2020-08-07: 100 ug via INTRAVENOUS

## 2020-08-07 MED ORDER — MIDAZOLAM HCL 2 MG/2ML IJ SOLN
INTRAMUSCULAR | Status: AC
  Filled 2020-08-07: qty 2

## 2020-08-07 MED ORDER — PROPOFOL 10 MG/ML IV BOLUS
INTRAVENOUS | Status: AC
  Filled 2020-08-07: qty 20

## 2020-08-07 MED ORDER — LIDOCAINE HCL (CARDIAC) PF 100 MG/5ML IV SOSY
PREFILLED_SYRINGE | INTRAVENOUS | Status: DC | PRN
  Administered 2020-08-07: 80 mg via INTRAVENOUS

## 2020-08-07 MED ORDER — LIDOCAINE-EPINEPHRINE 1 %-1:100000 IJ SOLN
INTRAMUSCULAR | Status: AC
  Filled 2020-08-07: qty 1

## 2020-08-07 MED ORDER — SUCCINYLCHOLINE 20MG/ML (10ML) SYRINGE FOR MEDFUSION PUMP - OPTIME
INTRAMUSCULAR | Status: DC | PRN
  Administered 2020-08-07: 120 mg via INTRAVENOUS

## 2020-08-07 MED ORDER — THROMBIN 20000 UNITS EX SOLR
CUTANEOUS | Status: AC
  Filled 2020-08-07: qty 20000

## 2020-08-07 MED ORDER — BUPIVACAINE HCL (PF) 0.25 % IJ SOLN
INTRAMUSCULAR | Status: AC
  Filled 2020-08-07: qty 30

## 2020-08-07 MED ORDER — KETAMINE HCL 50 MG/5ML IJ SOSY
PREFILLED_SYRINGE | INTRAMUSCULAR | Status: AC
  Filled 2020-08-07: qty 5

## 2020-08-07 MED ORDER — PHENYLEPHRINE HCL (PRESSORS) 10 MG/ML IV SOLN
INTRAVENOUS | Status: DC | PRN
  Administered 2020-08-07: 160 ug via INTRAVENOUS
  Administered 2020-08-07: 120 ug via INTRAVENOUS
  Administered 2020-08-07: 80 ug via INTRAVENOUS

## 2020-08-07 MED ORDER — LIDOCAINE-EPINEPHRINE 1 %-1:100000 IJ SOLN
INTRAMUSCULAR | Status: DC | PRN
  Administered 2020-08-07: 6 mg via INTRADERMAL

## 2020-08-07 MED ORDER — PHENYLEPHRINE HCL-NACL 10-0.9 MG/250ML-% IV SOLN
INTRAVENOUS | Status: DC | PRN
  Administered 2020-08-07: 50 ug/min via INTRAVENOUS

## 2020-08-07 MED ORDER — THROMBIN 5000 UNITS EX SOLR
CUTANEOUS | Status: AC
  Filled 2020-08-07: qty 5000

## 2020-08-07 SURGICAL SUPPLY — 65 items
ADH SKN CLS APL DERMABOND .7 (GAUZE/BANDAGES/DRESSINGS) ×1
APL SKNCLS STERI-STRIP NONHPOA (GAUZE/BANDAGES/DRESSINGS) ×1
BAND INSRT 18 STRL LF DISP RB (MISCELLANEOUS)
BAND RUBBER #18 3X1/16 STRL (MISCELLANEOUS) IMPLANT
BENZOIN TINCTURE PRP APPL 2/3 (GAUZE/BANDAGES/DRESSINGS) ×3 IMPLANT
BLADE CLIPPER SURG (BLADE) ×3 IMPLANT
BLADE SURG 11 STRL SS (BLADE) ×3 IMPLANT
BUR MATCHSTICK NEURO 3.0 LAGG (BURR) ×3 IMPLANT
CANISTER SUCT 3000ML PPV (MISCELLANEOUS) ×3 IMPLANT
CARTRIDGE OIL MAESTRO DRILL (MISCELLANEOUS) ×1 IMPLANT
CLOSURE STERI-STRIP 1/2X4 (GAUZE/BANDAGES/DRESSINGS) ×1
CLOSURE WOUND 1/2 X4 (GAUZE/BANDAGES/DRESSINGS) ×1
CLSR STERI-STRIP ANTIMIC 1/2X4 (GAUZE/BANDAGES/DRESSINGS) ×2 IMPLANT
COVER WAND RF STERILE (DRAPES) ×3 IMPLANT
DECANTER SPIKE VIAL GLASS SM (MISCELLANEOUS) ×3 IMPLANT
DERMABOND ADVANCED (GAUZE/BANDAGES/DRESSINGS) ×2
DERMABOND ADVANCED .7 DNX12 (GAUZE/BANDAGES/DRESSINGS) ×1 IMPLANT
DIFFUSER DRILL AIR PNEUMATIC (MISCELLANEOUS) ×3 IMPLANT
DRAPE C-ARM 42X72 X-RAY (DRAPES) IMPLANT
DRAPE LAPAROTOMY 100X72 PEDS (DRAPES) ×3 IMPLANT
DRAPE MICROSCOPE LEICA (MISCELLANEOUS) IMPLANT
DRAPE SURG 17X23 STRL (DRAPES) ×12 IMPLANT
DRSG MEPILEX SACRM 8.7X9.8 (GAUZE/BANDAGES/DRESSINGS) ×3 IMPLANT
DRSG OPSITE POSTOP 4X6 (GAUZE/BANDAGES/DRESSINGS) ×3 IMPLANT
DURAPREP 26ML APPLICATOR (WOUND CARE) ×3 IMPLANT
ELECT REM PT RETURN 9FT ADLT (ELECTROSURGICAL) ×3
ELECTRODE REM PT RTRN 9FT ADLT (ELECTROSURGICAL) ×1 IMPLANT
GAUZE 4X4 16PLY RFD (DISPOSABLE) IMPLANT
GAUZE SPONGE 4X4 12PLY STRL (GAUZE/BANDAGES/DRESSINGS) ×3 IMPLANT
GLOVE BIO SURGEON STRL SZ7 (GLOVE) ×3 IMPLANT
GLOVE BIO SURGEON STRL SZ7.5 (GLOVE) ×3 IMPLANT
GLOVE BIO SURGEON STRL SZ8 (GLOVE) ×3 IMPLANT
GLOVE BIOGEL PI IND STRL 7.0 (GLOVE) IMPLANT
GLOVE BIOGEL PI IND STRL 7.5 (GLOVE) ×1 IMPLANT
GLOVE BIOGEL PI INDICATOR 7.0 (GLOVE)
GLOVE BIOGEL PI INDICATOR 7.5 (GLOVE) ×2
GLOVE EXAM NITRILE XL STR (GLOVE) IMPLANT
GLOVE INDICATOR 8.5 STRL (GLOVE) ×3 IMPLANT
GOWN STRL REUS W/ TWL LRG LVL3 (GOWN DISPOSABLE) IMPLANT
GOWN STRL REUS W/ TWL XL LVL3 (GOWN DISPOSABLE) ×1 IMPLANT
GOWN STRL REUS W/TWL 2XL LVL3 (GOWN DISPOSABLE) IMPLANT
GOWN STRL REUS W/TWL LRG LVL3 (GOWN DISPOSABLE)
GOWN STRL REUS W/TWL XL LVL3 (GOWN DISPOSABLE) ×3
KIT BASIN OR (CUSTOM PROCEDURE TRAY) ×3 IMPLANT
KIT TURNOVER KIT B (KITS) ×3 IMPLANT
MARKER SKIN DUAL TIP RULER LAB (MISCELLANEOUS) ×3 IMPLANT
NEEDLE HYPO 25X1 1.5 SAFETY (NEEDLE) ×3 IMPLANT
NEEDLE SPNL 20GX3.5 QUINCKE YW (NEEDLE) ×3 IMPLANT
NS IRRIG 1000ML POUR BTL (IV SOLUTION) ×3 IMPLANT
OIL CARTRIDGE MAESTRO DRILL (MISCELLANEOUS) ×3
PACK LAMINECTOMY NEURO (CUSTOM PROCEDURE TRAY) ×3 IMPLANT
PAD ARMBOARD 7.5X6 YLW CONV (MISCELLANEOUS) ×9 IMPLANT
PIN MAYFIELD SKULL DISP (PIN) ×3 IMPLANT
SPONGE LAP 4X18 RFD (DISPOSABLE) IMPLANT
SPONGE SURGIFOAM ABS GEL 100 (HEMOSTASIS) ×3 IMPLANT
STRIP CLOSURE SKIN 1/2X4 (GAUZE/BANDAGES/DRESSINGS) ×2 IMPLANT
SUT ETHILON 4 0 PS 2 18 (SUTURE) IMPLANT
SUT VIC AB 0 CT1 18XCR BRD8 (SUTURE) ×1 IMPLANT
SUT VIC AB 0 CT1 8-18 (SUTURE) ×3
SUT VIC AB 2-0 CT1 18 (SUTURE) ×3 IMPLANT
SUT VIC AB 4-0 PS2 27 (SUTURE) ×3 IMPLANT
TOWEL GREEN STERILE (TOWEL DISPOSABLE) ×3 IMPLANT
TOWEL GREEN STERILE FF (TOWEL DISPOSABLE) ×3 IMPLANT
TRAY FOLEY MTR SLVR 16FR STAT (SET/KITS/TRAYS/PACK) IMPLANT
WATER STERILE IRR 1000ML POUR (IV SOLUTION) ×3 IMPLANT

## 2020-08-07 NOTE — Anesthesia Preprocedure Evaluation (Addendum)
Anesthesia Evaluation  Patient identified by MRN, date of birth, ID band Patient awake    Reviewed: Allergy & Precautions, NPO status , Patient's Chart, lab work & pertinent test results  Airway Mallampati: II  TM Distance: >3 FB Neck ROM: Full    Dental  (+) Dental Advisory Given, Poor Dentition   Pulmonary asthma , sleep apnea , COPD,  COPD inhaler, Current SmokerPatient did not abstain from smoking.,     + wheezing      Cardiovascular hypertension, Pt. on medications negative cardio ROS   Rhythm:Regular Rate:Tachycardia     Neuro/Psych  Headaches, PSYCHIATRIC DISORDERS Anxiety Depression  Neuromuscular disease (Cervical myelopathy)    GI/Hepatic Neg liver ROS, GERD  ,  Endo/Other  diabetes, Type 2, Oral Hypoglycemic Agents  Renal/GU negative Renal ROS     Musculoskeletal  (+) Arthritis , Fibromyalgia -, narcotic dependent  Abdominal   Peds  Hematology negative hematology ROS (+)   Anesthesia Other Findings   Reproductive/Obstetrics                            Anesthesia Physical Anesthesia Plan  ASA: III and emergent  Anesthesia Plan: General   Post-op Pain Management:    Induction: Intravenous, Cricoid pressure planned and Rapid sequence  PONV Risk Score and Plan: 3 and Midazolam, Dexamethasone and Ondansetron  Airway Management Planned: Oral ETT and Video Laryngoscope Planned  Additional Equipment:   Intra-op Plan:   Post-operative Plan: Extubation in OR  Informed Consent: I have reviewed the patients History and Physical, chart, labs and discussed the procedure including the risks, benefits and alternatives for the proposed anesthesia with the patient or authorized representative who has indicated his/her understanding and acceptance.     Dental advisory given and Only emergency history available  Plan Discussed with: CRNA  Anesthesia Plan Comments:         Anesthesia Quick Evaluation

## 2020-08-07 NOTE — ED Notes (Signed)
Pt in mri 

## 2020-08-07 NOTE — ED Notes (Addendum)
Pt O2% at 89%. Pt has been placed on nasal cannula @ 4L. O2 improved to 93%

## 2020-08-07 NOTE — ED Provider Notes (Signed)
[place] EMERGENCY DEPARTMENT Provider Note   CSN: [id number] Arrival date & time: 08/07/20  1659     History No chief complaint on file.   Margaret Vaughn is a 60 y.o. female.  HPI   60 year old female presents the emergency department with profound weakness of her extremities.  Patient reports that started back in September/2021 is when she developed weakness in her lower extremities that then progressed to her upper extremities.  Has been followed by neurology as an outpatient, she has had outpatient MRI which has identified syrinx in the thoracic and lumbar spine.  She was having an MRI of the brain done today for further evaluation and this is when they caught severe C2/C3 spinal stenosis with concern for cord edema.  For this reason she was referred to the emergency room for an emergent cervical spine MRI and evaluation.  Patient states typically she can get up and walk, today she is so weak that she is unable to even stand or weight-bear, she has been wheelchair-bound today.  Denies any fever or acute injury.  Past Medical History:  Diagnosis Date  . Acid reflux   . Allergies   . Asthma   . Bronchitis   . Cervical stenosis of spine   . Chronic pain syndrome   . COPD (chronic obstructive pulmonary disease) ([place])   . DDD (degenerative disc disease), lumbar   . Depression with anxiety   . Diabetes mellitus without complication ([place])   . Fatigue   . H/O blood clots   . Headache(784.0)   . Hemorrhoids, external   . HTN (hypertension)   . Hyperlipidemia   . Hypoxemia   . Insomnia   . Memory loss   . Myalgia and myositis   . Narcolepsy   . OSA (obstructive sleep apnea)   . Sleeping difficulty   . 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 Thoracalgia   . Tobacco abuse   . Tremor   . Ulcer disease     Patient Active Problem List   Diagnosis Date Noted  . Fibromyalgia 01/06/2019  . Neuropathy 01/06/2019  . Overweight 03/11/2018  . COPD (chronic obstructive  pulmonary disease) ([place]) 03/11/2018  . Type 2 diabetes mellitus with other specified complication ([place]) 95/62/1308  . PTSD (post-traumatic stress disorder) 03/11/2018  . GAD (generalized anxiety disorder) 01/30/2014  . Essential hypertension, benign 01/30/2014  . Hyperlipidemia 08/10/2012  . Tobacco abuse 08/10/2012  . GERD 12/24/2009    Past Surgical History:  Procedure Laterality Date  . ABDOMINAL HYSTERECTOMY    . CHOLECYSTECTOMY    . OOPHORECTOMY    . tonsillectomy       OB History   No obstetric history on file.     Family History  Problem Relation Age of Onset  . Ulcers Mother   . Sleep apnea Mother   . Migraines Mother   . Cancer Mother        uterine  . Arthritis Mother   . Hypertension Mother   . Heart disease Mother   . Depression Mother   . Hyperlipidemia Mother   . Colon cancer Mother   . Arrhythmia Son     Social History   Tobacco Use  . Smoking status: Current Every Day Smoker    Packs/day: 2.00    Years: 30.00    Pack years: 60.00    Types: Cigarettes  . Smokeless tobacco: Never Used  Vaping Use  . Vaping Use: Never used  Substance Use Topics  .  Alcohol use: No  . Drug use: No    Home Medications Prior to Admission medications   Medication Sig Start Date End Date Taking? Authorizing Provider  albuterol (VENTOLIN HFA) 108 (90 Base) MCG/ACT inhaler TAKE 2 PUFFS BY MOUTH EVERY 6 HOURS AS NEEDED FOR WHEEZE OR SHORTNESS OF BREATH 03/27/20   Dettinger, Fransisca Kaufmann, MD  ALPRAZolam Duanne Moron) 0.25 MG tablet Take 1-2 tabs (0.25mg -0.50mg ) 30-60 minutes before procedure. May repeat if needed.Do not drive. 08/05/20   Melvenia Beam, MD  atorvastatin (LIPITOR) 40 MG tablet Take 1 tablet (40 mg total) by mouth daily. 06/26/20   Dettinger, Fransisca Kaufmann, MD  cyclobenzaprine (FLEXERIL) 10 MG tablet TAKE 1 TABLET BY MOUTH THREE TIMES A DAY AS NEEDED FOR MUSCLE SPASMS 06/26/20   Dettinger, Fransisca Kaufmann, MD  enalapril (VASOTEC) 5 MG tablet Take 1 tablet (5 mg total) by mouth  daily. 06/26/20   Dettinger, Fransisca Kaufmann, MD  Fluticasone-Salmeterol (ADVAIR DISKUS) 100-50 MCG/DOSE AEPB Inhale 1 puff into the lungs 2 (two) times daily. 07/18/20   Dettinger, Fransisca Kaufmann, MD  Galcanezumab-gnlm (EMGALITY) 120 MG/ML SOAJ Inject 120 mg into the skin every 30 (thirty) days. 03/13/20   Melvenia Beam, MD  Melatonin 300 MCG TABS Take 1 tablet by mouth at bedtime.    [provider]  Multiple Vitamins-Minerals (HAIR SKIN NAILS PO) Take 3 tablets by mouth daily.    [provider]  Multiple Vitamins-Minerals (MULTIVITAMIN WITH MINERALS) tablet Take 1 tablet by mouth daily.    [provider]  Omega-3 Fatty Acids (FISH OIL) 1000 MG CAPS Take 2 capsules by mouth at bedtime.    [provider]  oxyCODONE (OXYCONTIN) 20 mg 12 hr tablet Take 1 tablet (20 mg total) by mouth every 12 (twelve) hours. 06/26/20   Dettinger, Fransisca Kaufmann, MD  oxyCODONE (OXYCONTIN) 20 mg 12 hr tablet Take 1 tablet (20 mg total) by mouth every 12 (twelve) hours. 07/26/20   Dettinger, Fransisca Kaufmann, MD  pregabalin (LYRICA) 150 MG capsule Take 1 capsule (150 mg total) by mouth 3 (three) times daily. 06/26/20   Dettinger, Fransisca Kaufmann, MD  Probiotic Product (PROBIOTIC-10) CAPS Take 1 tablet by mouth daily.    [provider]  rizatriptan (MAXALT-MLT) 10 MG disintegrating tablet Take 1 tablet (10 mg total) by mouth as needed for migraine. May repeat in 2 hours if needed 03/13/20   Melvenia Beam, MD  sitaGLIPtin-metformin (JANUMET) 50-1000 MG tablet Take 1 tablet by mouth 2 (two) times daily with a meal. 06/26/20   Dettinger, Fransisca Kaufmann, MD  tiotropium (SPIRIVA HANDIHALER) 18 MCG inhalation capsule INHALE CONTENTS OF 1 CAPSULE VIA HANDIHALER ONCE DAILY AT THE SAME TIME EVERY DAY 06/26/20   Dettinger, Fransisca Kaufmann, MD  VITAMIN D, ERGOCALCIFEROL, PO Take 1 capsule by mouth daily.    [provider]    Allergies    Iohexol, Moxifloxacin, and Shellfish allergy  Review of Systems   Review of  Systems  Constitutional: Negative for chills and fever.  HENT: Negative for congestion.   Eyes: Negative for visual disturbance.  Respiratory: Negative for shortness of breath.   Cardiovascular: Negative for chest pain.  Gastrointestinal: Negative for abdominal pain, diarrhea and vomiting.  Genitourinary: Negative for dysuria.  Musculoskeletal:       Neck and back pain, significant weakness of 4/4 extremities with sensory change  Skin: Negative for rash.  Neurological: Positive for weakness and numbness. Negative for headaches.    Physical Exam Updated Vital Signs BP 105/84  Pulse (!) 104   Temp 97.9 F (36.6 C) (Oral)   Resp (!) 23   SpO2 96%   Physical Exam Vitals and nursing note reviewed.  Constitutional:      Appearance: Normal appearance.  HENT:     Head: Normocephalic.     Mouth/Throat:     Mouth: Mucous membranes are moist.  Cardiovascular:     Rate and Rhythm: Normal rate.  Pulmonary:     Effort: Pulmonary effort is normal. No respiratory distress.  Abdominal:     Palpations: Abdomen is soft.  Musculoskeletal:        General: Swelling present. No deformity.  Skin:    General: Skin is warm.  Neurological:     Mental Status: She is alert and oriented to person, place, and time.     Comments: 3/5 weakness in 4/4 extremities with loss of sensory across different dermatomes, patient states that this is acutely changed today  Psychiatric:        Mood and Affect: Mood normal.     ED Results / Procedures / Treatments   Labs (all labs ordered are listed, but only abnormal results are displayed) Labs Reviewed  CBC - Abnormal; Notable for the following components:      Result Value   RBC 5.38 (*)    Hemoglobin 15.7 (*)    HCT 49.3 (*)    All other components within normal limits  BASIC METABOLIC PANEL - Abnormal; Notable for the following components:   Glucose, Bld 122 (*)    BUN 5 (*)    All other components within normal limits  RESP PANEL BY RT-PCR  (FLU A&B, COVID) ARPGX2    EKG None  Radiology MR Cervical Spine Wo Contrast  Result Date: 08/07/2020 CLINICAL DATA:  Spinal stenosis MRI brain EXAM: MRI CERVICAL SPINE WITHOUT CONTRAST TECHNIQUE: Multiplanar, multisequence MR imaging of the cervical spine was performed. No intravenous contrast was administered. COMPARISON:  July 08, 2019 FINDINGS: Significant motion artifact is present. Alignment: Stable. Vertebrae: There is marrow edema at the posing C2-C3 endplates. There is also marrow edema involving C2 and C3 posterior elements. Stable vertebral body heights. Cord: Cord compression at C2-C3 with abnormal cord signal. Posterior Fossa, vertebral arteries, paraspinal tissues: Mild paraspinal edema adjacent to the C2-C3 facets. Otherwise unremarkable. Disc levels: C2-C3: Disc bulge eccentric to the right with endplate osteophytes. Facet and uncovertebral hypertrophy with ligamentum flavum thickening. Small subcentimeter area of cystic change within the ligamentum flavum superior to disc level. Severe canal stenosis with cord compression. Probable right much greater than left foraminal stenosis. C3-C4: Disc bulge with endplate osteophytes. Facet and uncovertebral hypertrophy. Mild to moderate canal stenosis. Probable left greater than right foraminal stenosis. C4-C5: Disc bulge with endplate osteophytes. Uncovertebral and facet hypertrophy. No canal stenosis. Probable bilateral foraminal stenosis. C5-C6: Disc bulge with endplate osteophytes. Canal left foraminal stenosis. Probable right foraminal stenosis. C6-C7: Disc bulge with endplate osteophytes. No canal foraminal stenosis. C7-T1: Disc bulge with endplate osteophytes. No canal or foraminal stenosis. IMPRESSION: Significantly motion degraded study. Severe canal stenosis at C2-C3 is confirmed with cord compression and abnormal cord signal reflecting edema or myelomalacia. Electronically Signed   By: Macy Mis M.D.   On: 08/07/2020 19:51     Procedures Procedures (including critical care time)  Medications Ordered in ED Medications  dexamethasone (DECADRON) injection 10 mg (10 mg Intravenous Given 08/07/20 2208)    ED Course  I have reviewed the triage vital signs and the nursing notes.  Pertinent  labs & imaging results that were available during my care of the patient were reviewed by me and considered in my medical decision making (see chart for details).    MDM Rules/Calculators/A&P                          Patient presents for evaluation of C2/C3 cervical spine stenosis is identified an MRI of the brain done as an outpatient.  MRI here reiterates the severe spinal canal stenosis with cord edema.  Findings have been discussed with on-call neurosurgery, they have evaluated the patient and deemed that she needs emergent decompressive surgery.  Patient will be admitted to the neurosurgical service.  Final Clinical Impression(s) / ED Diagnoses Final diagnoses:  Cervical stenosis of spine    Rx / DC Orders ED Discharge Orders    None       Lorelle Gibbs, DO 08/07/20 2234

## 2020-08-07 NOTE — Telephone Encounter (Signed)
Spoke with pt/husband. They are going to go to the ER. Due to potential wait in ER pt was recommended to make an appt with [facility]. Referral is being sent over. Per Lovena Le in our referrals dept, they will call pt to schedule the appt.

## 2020-08-07 NOTE — H&P (Addendum)
Margaret Vaughn is an 60 y.o. female.   HPI:  60 year old female presented to the ED today after progressive upper and lower extremity weakness that got significantly worse since yesterday morning. She has had this progress decline since September. Has apparently seen someone at [facility] who told them he did not understand why she was becoming so weak. Her husband contacted Dr. Jaynee Eagles today and informed her that she had gotten worse. She had an MRI brain done today that showed severe stenosis at C2-3 with cord compression. She was then instructed to go to the ED for an MRI of her C-spine. She has been unable to walk since yesterday morning. Reports pain in her neck with a lightening bolt sensation in her arms at times. Endorses numbness in her arms and her legs but no tingling. Has a history of COPD and asthma. Denies any blood thinners  Past Medical History:  Diagnosis Date  . Acid reflux   . Allergies   . Asthma   . Bronchitis   . Cervical stenosis of spine   . Chronic pain syndrome   . COPD (chronic obstructive pulmonary disease) ([place])   . DDD (degenerative disc disease), lumbar   . Depression with anxiety   . Diabetes mellitus without complication ([place])   . Fatigue   . H/O blood clots   . Headache(784.0)   . Hemorrhoids, external   . HTN (hypertension)   . Hyperlipidemia   . Hypoxemia   . Insomnia   . Memory loss   . Myalgia and myositis   . Narcolepsy   . OSA (obstructive sleep apnea)   . Sleeping difficulty   . 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 Thoracalgia   . Tobacco abuse   . Tremor   . Ulcer disease     Past Surgical History:  Procedure Laterality Date  . ABDOMINAL HYSTERECTOMY    . CHOLECYSTECTOMY    . OOPHORECTOMY    . tonsillectomy      Allergies  Allergen Reactions  . Iohexol Anaphylaxis    PT.GIVEN OMNI 300 FOR CT ANGIO WAS GIVEN 50 MG.BENADRYL PRIOR TO SCAN HAD NO PROBLEMS   . Moxifloxacin Anaphylaxis  . Shellfish Allergy Hives    Social History    Tobacco Use  . Smoking status: Current Every Day Smoker    Packs/day: 2.00    Years: 30.00    Pack years: 60.00    Types: Cigarettes  . Smokeless tobacco: Never Used  Substance Use Topics  . Alcohol use: No    Family History  Problem Relation Age of Onset  . Ulcers Mother   . Sleep apnea Mother   . Migraines Mother   . Cancer Mother        uterine  . Arthritis Mother   . Hypertension Mother   . Heart disease Mother   . Depression Mother   . Hyperlipidemia Mother   . Colon cancer Mother   . Arrhythmia Son      Review of Systems  Positive ROS: as above All other systems have been reviewed and were otherwise negative with the exception of those mentioned in the HPI and as above.  Objective: Vital signs in last 24 hours: Temp:  [97.6 F (36.4 C)-97.9 F (36.6 C)] 97.9 F (36.6 C) (01/12 2039) Pulse Rate:  [86-103] 103 (01/12 2039) Resp:  [16-17] 17 (01/12 2039) BP: (114-117)/(78-98) 117/78 (01/12 2039) SpO2:  [86 %-92 %] 92 % (01/12 2039)  General Appearance: Alert, cooperative, mild  distress, appears stated age,dishevled appearance Head: Normocephalic, without obvious abnormality, atraumatic Neck: Supple, symmetrical, trachea midline, no adenopathy; thyroid: No enlargement/tenderness/nodules; no carotid bruit or JVD, kyphotic posture Lungs: respirations unlabored Heart: Regular rate and rhythm, S1 and S2 normal, no murmur, rub or gallop Extremities: Extremities normal, atraumatic, no cyanosis or edema, burns to her right hand Pulses: 2+ and symmetric all extremities Skin: Skin color, texture, turgor normal, no rashes or lesions, various abrasions  NEUROLOGIC:   Mental status: A&O x4, no aphasia, good attention span, Memory and fund of knowledge Motor Exam - progressive dense quadraplegic, upper extremities 3/5, bilateral deltoid 2/5, hand grips 3/5, lower extremities 3/5 Sensory Exam - decreased sensation bilateral upper and lower extremities Reflexes:  symmetric, hyperreflexia in uppers and lowers, No Hoffman's, No clonus Coordination - grossly abnormal Gait - unable to test Balance -unable to test Cranial Nerves: I: smell Not tested  II: visual acuity  OS: na    OD: na  II: visual fields Full to confrontation  II: pupils Equal, round, reactive to light  III,VII: ptosis None  III,IV,VI: extraocular muscles  Full ROM  V: mastication   V: facial light touch sensation    V,VII: corneal reflex    VII: facial muscle function - upper    VII: facial muscle function - lower   VIII: hearing   IX: soft palate elevation    IX,X: gag reflex   XI: trapezius strength    XI: sternocleidomastoid strength   XI: neck flexion strength    XII: tongue strength      Data Review Lab Results  Component Value Date   WBC 8.4 08/07/2020   HGB 15.7 (H) 08/07/2020   HCT 49.3 (H) 08/07/2020   MCV 91.6 08/07/2020   PLT 284 08/07/2020   Lab Results  Component Value Date   NA 140 08/07/2020   K 3.9 08/07/2020   CL 99 08/07/2020   CO2 30 08/07/2020   BUN 5 (L) 08/07/2020   CREATININE 0.53 08/07/2020   GLUCOSE 122 (H) 08/07/2020   No results found for: INR, PROTIME  Radiology: MR Cervical Spine Wo Contrast  Result Date: 08/07/2020 CLINICAL DATA:  Spinal stenosis MRI brain EXAM: MRI CERVICAL SPINE WITHOUT CONTRAST TECHNIQUE: Multiplanar, multisequence MR imaging of the cervical spine was performed. No intravenous contrast was administered. COMPARISON:  July 08, 2019 FINDINGS: Significant motion artifact is present. Alignment: Stable. Vertebrae: There is marrow edema at the posing C2-C3 endplates. There is also marrow edema involving C2 and C3 posterior elements. Stable vertebral body heights. Cord: Cord compression at C2-C3 with abnormal cord signal. Posterior Fossa, vertebral arteries, paraspinal tissues: Mild paraspinal edema adjacent to the C2-C3 facets. Otherwise unremarkable. Disc levels: C2-C3: Disc bulge eccentric to the right with endplate  osteophytes. Facet and uncovertebral hypertrophy with ligamentum flavum thickening. Small subcentimeter area of cystic change within the ligamentum flavum superior to disc level. Severe canal stenosis with cord compression. Probable right much greater than left foraminal stenosis. C3-C4: Disc bulge with endplate osteophytes. Facet and uncovertebral hypertrophy. Mild to moderate canal stenosis. Probable left greater than right foraminal stenosis. C4-C5: Disc bulge with endplate osteophytes. Uncovertebral and facet hypertrophy. No canal stenosis. Probable bilateral foraminal stenosis. C5-C6: Disc bulge with endplate osteophytes. Canal left foraminal stenosis. Probable right foraminal stenosis. C6-C7: Disc bulge with endplate osteophytes. No canal foraminal stenosis. C7-T1: Disc bulge with endplate osteophytes. No canal or foraminal stenosis. IMPRESSION: Significantly motion degraded study. Severe canal stenosis at C2-C3 is confirmed with cord compression and abnormal  cord signal reflecting edema or myelomalacia. Electronically Signed   By: Macy Mis M.D.   On: 08/07/2020 19:51    Assessment/Plan: 60 year old female presented to the ED today after rapid progression of weakness in her upper and lower extremities. MRI cervical spine shows severe stenosis at C2-3 with cord compression. I do think that given her progression of weakness and myelopathy that she does need to undergo emergent surgery to decompress her spinal cord. We will plan for a C2-3 laminectomy. We have discussed all the risks and benefits associated with the surgery. She understood and agrees to move forward. Would like to get a stat CT c spine before surgery. Ordered 10mg  Decadron IV now.    Ocie [facility] 08/07/2020 9:46 PM   Agree with above we have extensively gone over the risks and benefits of a posterior cervical decompressive laminectomy that would extend from the inferior aspect of C1 to the superior aspect of C3.  We have  also gone over perioperative course expectations of outcome and alternatives of surgery and they understand agree to proceed forward.  As above she will be given 10 mg of Decadron get a CT scan of her neck COVID test cervical collar.

## 2020-08-07 NOTE — Telephone Encounter (Signed)
Arby Barrette from [facility] called about pt.'s MRI study & she states they are temporarily suspending outpatient MRI imagining due to being short staffed. Please contact her at [phone].

## 2020-08-07 NOTE — Telephone Encounter (Signed)
I reviewed the images, appears to be very severe cervical stenosis on MRI. Husband said she is significantly declining.  Again, I recommend ED where they can get an MRI of the cervical spine stat and call neurosurgery. If she waits until Friday then it won;t be until next week she can see neurosurgery outpatient. I highly encourage her to go to [facility] and we can call the triage nurse. Dr. Saintclair Halsted is on call I will cc him here so he can review just in case patient goes to the ED and neurosurgery is called.

## 2020-08-07 NOTE — Telephone Encounter (Signed)
Noted patient is on the way to [facility].

## 2020-08-07 NOTE — Telephone Encounter (Signed)
We spoke with patient today and discussed findings on MRI of the brain that shows high level severe cervical stenosis C2-c3 with cord edema.  Patient states that she has been acutely worsening, she has not been able to get up, I fear that if we do not get her in for an immediate MRI of the cervical spine that she may become tetraplegic or may affect her breathing given the level of the cord injury.  I spoke with [facility] who is the Triage nurse and explained the seriousness of her condition that she needs an MRI of the cervical spine as quickly as possible and, if above confirmed by MRI of the cervical spine, neurosurgery consult.  I will loop in Dr. Saintclair Halsted who is covering neurosurgery today. thanks

## 2020-08-07 NOTE — Telephone Encounter (Signed)
Received stat call from [facility] radiology. She stated the MRI brain has been completed and per Dr Carlis Abbott, brain is normal. However there is significant spinal stenosis and cord compression at C2-3. Appears to have worsened since last MRI. They were able to visualize part of c-spine on brain MRI. She is faxing the results over to [place].   Patient has upcoming MRI c-spine & T-spine on Friday 08/09/20. Received MRI brain report via fax and gave it to Dr Jaynee Eagles. She was also able to locate images on [place].  Per Dr. Jaynee Eagles, need urgent referral to [facility] Neurosurgery however with pt having been reporting worsening she should go to the ER so they can do MRI c-spine quicker and admit for possible surgery. I spoke with the husband and let him know of the findings and what Dr Jaynee Eagles has advised. He is aware we are prepared to call triage in ER to let them know. He stated he would call the pt and discuss and call me right back.

## 2020-08-07 NOTE — Anesthesia Procedure Notes (Signed)
Procedure Name: Intubation Date/Time: 08/07/2020 11:18 PM Performed by: Valetta Fuller, CRNA Pre-anesthesia Checklist: Patient identified, Emergency Drugs available, Suction available and Patient being monitored Patient Re-evaluated:Patient Re-evaluated prior to induction Oxygen Delivery Method: Circle system utilized Preoxygenation: Pre-oxygenation with 100% oxygen Induction Type: IV induction, Rapid sequence and Cricoid Pressure applied Laryngoscope Size: Glidescope Grade View: Grade I Tube type: Oral Tube size: 7.5 mm Number of attempts: 1 Airway Equipment and Method: Stylet Placement Confirmation: ETT inserted through vocal cords under direct vision,  positive ETCO2 and breath sounds checked- equal and bilateral Secured at: 22 cm Tube secured with: Tape Dental Injury: Teeth and Oropharynx as per pre-operative assessment  Comments: Cervical collar front removed for DL and intubation, keeping head neutral. Collar replaced after intubation.

## 2020-08-07 NOTE — ED Notes (Signed)
J [place] c-collar place on pt

## 2020-08-07 NOTE — ED Triage Notes (Signed)
Pt from home, severe weakness in legs and numbness in bilateral legs and arms. Pt in contact with neurosurgeon who advised to schedule appointment tomorrow or go to ED for MRI of cervical spine d/t c2-3 stenosis.

## 2020-08-08 ENCOUNTER — Encounter (HOSPITAL_COMMUNITY): Payer: Self-pay | Admitting: Neurosurgery

## 2020-08-08 DIAGNOSIS — J9611 Chronic respiratory failure with hypoxia: Secondary | ICD-10-CM | POA: Diagnosis not present

## 2020-08-08 DIAGNOSIS — E785 Hyperlipidemia, unspecified: Secondary | ICD-10-CM | POA: Diagnosis not present

## 2020-08-08 DIAGNOSIS — Z888 Allergy status to other drugs, medicaments and biological substances status: Secondary | ICD-10-CM | POA: Diagnosis not present

## 2020-08-08 DIAGNOSIS — Z7984 Long term (current) use of oral hypoglycemic drugs: Secondary | ICD-10-CM | POA: Diagnosis not present

## 2020-08-08 DIAGNOSIS — G4733 Obstructive sleep apnea (adult) (pediatric): Secondary | ICD-10-CM | POA: Diagnosis not present

## 2020-08-08 DIAGNOSIS — G47419 Narcolepsy without cataplexy: Secondary | ICD-10-CM | POA: Diagnosis not present

## 2020-08-08 DIAGNOSIS — M50223 Other cervical disc displacement at C6-C7 level: Secondary | ICD-10-CM | POA: Diagnosis not present

## 2020-08-08 DIAGNOSIS — M50221 Other cervical disc displacement at C4-C5 level: Secondary | ICD-10-CM | POA: Diagnosis not present

## 2020-08-08 DIAGNOSIS — T2121XA Burn of second degree of chest wall, initial encounter: Secondary | ICD-10-CM | POA: Diagnosis present

## 2020-08-08 DIAGNOSIS — Z90710 Acquired absence of both cervix and uterus: Secondary | ICD-10-CM | POA: Diagnosis not present

## 2020-08-08 DIAGNOSIS — J439 Emphysema, unspecified: Secondary | ICD-10-CM

## 2020-08-08 DIAGNOSIS — Z8249 Family history of ischemic heart disease and other diseases of the circulatory system: Secondary | ICD-10-CM | POA: Diagnosis not present

## 2020-08-08 DIAGNOSIS — Z818 Family history of other mental and behavioral disorders: Secondary | ICD-10-CM | POA: Diagnosis not present

## 2020-08-08 DIAGNOSIS — F418 Other specified anxiety disorders: Secondary | ICD-10-CM | POA: Diagnosis not present

## 2020-08-08 DIAGNOSIS — G8252 Quadriplegia, C1-C4 incomplete: Secondary | ICD-10-CM | POA: Diagnosis not present

## 2020-08-08 DIAGNOSIS — M62838 Other muscle spasm: Secondary | ICD-10-CM | POA: Diagnosis not present

## 2020-08-08 DIAGNOSIS — E663 Overweight: Secondary | ICD-10-CM | POA: Diagnosis present

## 2020-08-08 DIAGNOSIS — R296 Repeated falls: Secondary | ICD-10-CM | POA: Diagnosis present

## 2020-08-08 DIAGNOSIS — G894 Chronic pain syndrome: Secondary | ICD-10-CM | POA: Diagnosis not present

## 2020-08-08 DIAGNOSIS — Z881 Allergy status to other antibiotic agents status: Secondary | ICD-10-CM | POA: Diagnosis not present

## 2020-08-08 DIAGNOSIS — S199XXA Unspecified injury of neck, initial encounter: Secondary | ICD-10-CM | POA: Diagnosis not present

## 2020-08-08 DIAGNOSIS — K5900 Constipation, unspecified: Secondary | ICD-10-CM | POA: Diagnosis not present

## 2020-08-08 DIAGNOSIS — F17210 Nicotine dependence, cigarettes, uncomplicated: Secondary | ICD-10-CM | POA: Diagnosis present

## 2020-08-08 DIAGNOSIS — E782 Mixed hyperlipidemia: Secondary | ICD-10-CM

## 2020-08-08 DIAGNOSIS — E1169 Type 2 diabetes mellitus with other specified complication: Secondary | ICD-10-CM

## 2020-08-08 DIAGNOSIS — J9610 Chronic respiratory failure, unspecified whether with hypoxia or hypercapnia: Secondary | ICD-10-CM | POA: Diagnosis not present

## 2020-08-08 DIAGNOSIS — Z20822 Contact with and (suspected) exposure to covid-19: Secondary | ICD-10-CM | POA: Diagnosis not present

## 2020-08-08 DIAGNOSIS — Z83438 Family history of other disorder of lipoprotein metabolism and other lipidemia: Secondary | ICD-10-CM | POA: Diagnosis not present

## 2020-08-08 DIAGNOSIS — E1165 Type 2 diabetes mellitus with hyperglycemia: Secondary | ICD-10-CM | POA: Diagnosis present

## 2020-08-08 DIAGNOSIS — E1142 Type 2 diabetes mellitus with diabetic polyneuropathy: Secondary | ICD-10-CM | POA: Diagnosis not present

## 2020-08-08 DIAGNOSIS — I10 Essential (primary) hypertension: Secondary | ICD-10-CM | POA: Diagnosis not present

## 2020-08-08 DIAGNOSIS — M4802 Spinal stenosis, cervical region: Secondary | ICD-10-CM | POA: Diagnosis not present

## 2020-08-08 DIAGNOSIS — Z91041 Radiographic dye allergy status: Secondary | ICD-10-CM | POA: Diagnosis not present

## 2020-08-08 DIAGNOSIS — K219 Gastro-esophageal reflux disease without esophagitis: Secondary | ICD-10-CM | POA: Diagnosis not present

## 2020-08-08 DIAGNOSIS — Z981 Arthrodesis status: Secondary | ICD-10-CM | POA: Diagnosis not present

## 2020-08-08 DIAGNOSIS — G4700 Insomnia, unspecified: Secondary | ICD-10-CM | POA: Diagnosis not present

## 2020-08-08 DIAGNOSIS — Y929 Unspecified place or not applicable: Secondary | ICD-10-CM | POA: Diagnosis not present

## 2020-08-08 DIAGNOSIS — Z90722 Acquired absence of ovaries, bilateral: Secondary | ICD-10-CM | POA: Diagnosis not present

## 2020-08-08 DIAGNOSIS — Z91013 Allergy to seafood: Secondary | ICD-10-CM | POA: Diagnosis not present

## 2020-08-08 DIAGNOSIS — Z8261 Family history of arthritis: Secondary | ICD-10-CM | POA: Diagnosis not present

## 2020-08-08 DIAGNOSIS — Z9981 Dependence on supplemental oxygen: Secondary | ICD-10-CM | POA: Diagnosis not present

## 2020-08-08 DIAGNOSIS — F4310 Post-traumatic stress disorder, unspecified: Secondary | ICD-10-CM | POA: Diagnosis present

## 2020-08-08 DIAGNOSIS — J449 Chronic obstructive pulmonary disease, unspecified: Secondary | ICD-10-CM | POA: Diagnosis not present

## 2020-08-08 DIAGNOSIS — T2122XA Burn of second degree of abdominal wall, initial encounter: Secondary | ICD-10-CM | POA: Diagnosis present

## 2020-08-08 DIAGNOSIS — M5021 Other cervical disc displacement,  high cervical region: Secondary | ICD-10-CM | POA: Diagnosis not present

## 2020-08-08 DIAGNOSIS — Z9049 Acquired absence of other specified parts of digestive tract: Secondary | ICD-10-CM | POA: Diagnosis not present

## 2020-08-08 DIAGNOSIS — M4692 Unspecified inflammatory spondylopathy, cervical region: Secondary | ICD-10-CM | POA: Diagnosis not present

## 2020-08-08 DIAGNOSIS — G9520 Unspecified cord compression: Secondary | ICD-10-CM | POA: Diagnosis not present

## 2020-08-08 DIAGNOSIS — Z79899 Other long term (current) drug therapy: Secondary | ICD-10-CM | POA: Diagnosis not present

## 2020-08-08 DIAGNOSIS — M4712 Other spondylosis with myelopathy, cervical region: Secondary | ICD-10-CM | POA: Diagnosis not present

## 2020-08-08 DIAGNOSIS — M47812 Spondylosis without myelopathy or radiculopathy, cervical region: Secondary | ICD-10-CM | POA: Diagnosis not present

## 2020-08-08 LAB — HEMOGLOBIN A1C
Hgb A1c MFr Bld: 6.1 % — ABNORMAL HIGH (ref 4.8–5.6)
Mean Plasma Glucose: 128.37 mg/dL

## 2020-08-08 LAB — GLUCOSE, CAPILLARY
Glucose-Capillary: 126 mg/dL — ABNORMAL HIGH (ref 70–99)
Glucose-Capillary: 165 mg/dL — ABNORMAL HIGH (ref 70–99)
Glucose-Capillary: 236 mg/dL — ABNORMAL HIGH (ref 70–99)
Glucose-Capillary: 144 mg/dL — ABNORMAL HIGH (ref 70–99)
Glucose-Capillary: 179 mg/dL — ABNORMAL HIGH (ref 70–99)

## 2020-08-08 MED ORDER — ENALAPRIL MALEATE 5 MG PO TABS
5.0000 mg | ORAL_TABLET | Freq: Every day | ORAL | Status: DC
Start: 2020-08-08 — End: 2020-08-13
  Administered 2020-08-08 – 2020-08-13 (×6): 5 mg via ORAL
  Filled 2020-08-08 (×6): qty 1

## 2020-08-08 MED ORDER — ACETAMINOPHEN 325 MG PO TABS: 650.0000 mg | ORAL_TABLET | Freq: Four times a day (QID) | ORAL | Status: DC | PRN

## 2020-08-08 MED ORDER — HYDROMORPHONE HCL 1 MG/ML IJ SOLN
0.5000 mg | INTRAMUSCULAR | Status: DC | PRN
  Administered 2020-08-09 – 2020-08-10 (×6): 1 mg via INTRAVENOUS
  Filled 2020-08-08 (×6): qty 1

## 2020-08-08 MED ORDER — LACTATED RINGERS IV SOLN: INTRAVENOUS | Status: DC | PRN

## 2020-08-08 MED ORDER — KETAMINE HCL 10 MG/ML IJ SOLN
INTRAMUSCULAR | Status: DC | PRN
  Administered 2020-08-07 – 2020-08-08 (×5): 10 mg via INTRAVENOUS

## 2020-08-08 MED ORDER — MELATONIN 3 MG PO TABS
3.0000 mg | ORAL_TABLET | Freq: Every day | ORAL | Status: DC
  Administered 2020-08-08 – 2020-08-12 (×5): 3 mg via ORAL
  Filled 2020-08-08 (×5): qty 1

## 2020-08-08 MED ORDER — BISACODYL 5 MG PO TBEC: 5.0000 mg | DELAYED_RELEASE_TABLET | Freq: Every day | ORAL | Status: DC | PRN

## 2020-08-08 MED ORDER — ADULT MULTIVITAMIN W/MINERALS CH
1.0000 | ORAL_TABLET | Freq: Every day | ORAL | Status: DC
  Administered 2020-08-08 – 2020-08-13 (×6): 1 via ORAL
  Filled 2020-08-08 (×6): qty 1

## 2020-08-08 MED ORDER — METFORMIN HCL 500 MG PO TABS: 1000.0000 mg | ORAL_TABLET | Freq: Two times a day (BID) | ORAL | Status: DC

## 2020-08-08 MED ORDER — FENTANYL CITRATE (PF) 100 MCG/2ML IJ SOLN: 25.0000 ug | INTRAMUSCULAR | Status: DC | PRN

## 2020-08-08 MED ORDER — BACITRACIN ZINC 500 UNIT/GM EX OINT
TOPICAL_OINTMENT | CUTANEOUS | Status: DC | PRN
  Administered 2020-08-08: 1 via TOPICAL

## 2020-08-08 MED ORDER — UMECLIDINIUM BROMIDE 62.5 MCG/INH IN AEPB
1.0000 | INHALATION_SPRAY | Freq: Every day | RESPIRATORY_TRACT | Status: DC
  Administered 2020-08-09: 1 via RESPIRATORY_TRACT
  Filled 2020-08-08: qty 7

## 2020-08-08 MED ORDER — HYDRALAZINE HCL 10 MG PO TABS: 10.0000 mg | ORAL_TABLET | Freq: Four times a day (QID) | ORAL | Status: DC | PRN

## 2020-08-08 MED ORDER — NALOXONE HCL 0.4 MG/ML IJ SOLN
INTRAMUSCULAR | Status: DC | PRN
Start: 2020-08-08 — End: 2020-08-08
  Administered 2020-08-08 (×2): 80 ug via INTRAVENOUS

## 2020-08-08 MED ORDER — SENNA 8.6 MG PO TABS
1.0000 | ORAL_TABLET | Freq: Two times a day (BID) | ORAL | Status: DC
  Administered 2020-08-08 – 2020-08-13 (×11): 8.6 mg via ORAL
  Filled 2020-08-08 (×11): qty 1

## 2020-08-08 MED ORDER — SILVER SULFADIAZINE 1 % EX CREA
TOPICAL_CREAM | Freq: Every day | CUTANEOUS | Status: DC
  Administered 2020-08-09: 1 via TOPICAL
  Filled 2020-08-08 (×2): qty 85

## 2020-08-08 MED ORDER — LINAGLIPTIN 5 MG PO TABS: 5.0000 mg | ORAL_TABLET | Freq: Every day | ORAL | Status: DC

## 2020-08-08 MED ORDER — VITAMIN D 25 MCG (1000 UNIT) PO TABS
1000.0000 [IU] | ORAL_TABLET | Freq: Every day | ORAL | Status: DC
  Administered 2020-08-08 – 2020-08-13 (×6): 1000 [IU] via ORAL
  Filled 2020-08-08 (×6): qty 1

## 2020-08-08 MED ORDER — ALBUTEROL SULFATE HFA 108 (90 BASE) MCG/ACT IN AERS
2.0000 | INHALATION_SPRAY | Freq: Four times a day (QID) | RESPIRATORY_TRACT | Status: DC
  Filled 2020-08-08: qty 6.7

## 2020-08-08 MED ORDER — 0.9 % SODIUM CHLORIDE (POUR BTL) OPTIME
TOPICAL | Status: DC | PRN
  Administered 2020-08-08: 1000 mL

## 2020-08-08 MED ORDER — SUMATRIPTAN SUCCINATE 50 MG PO TABS: 50.0000 mg | ORAL_TABLET | ORAL | Status: DC | PRN

## 2020-08-08 MED ORDER — ONDANSETRON HCL 4 MG/2ML IJ SOLN: 4.0000 mg | Freq: Four times a day (QID) | INTRAMUSCULAR | Status: DC | PRN

## 2020-08-08 MED ORDER — THROMBIN 20000 UNITS EX SOLR
CUTANEOUS | Status: DC | PRN
  Administered 2020-08-08: 20 mL via TOPICAL

## 2020-08-08 MED ORDER — DM-GUAIFENESIN ER 30-600 MG PO TB12
1.0000 | ORAL_TABLET | Freq: Two times a day (BID) | ORAL | Status: DC
  Administered 2020-08-08 – 2020-08-13 (×11): 1 via ORAL
  Filled 2020-08-08 (×11): qty 1

## 2020-08-08 MED ORDER — POLYETHYLENE GLYCOL 3350 17 G PO PACK
17.0000 g | PACK | Freq: Every day | ORAL | Status: DC | PRN
  Administered 2020-08-13: 17 g via ORAL
  Filled 2020-08-08: qty 1

## 2020-08-08 MED ORDER — DOCUSATE SODIUM 100 MG PO CAPS
100.0000 mg | ORAL_CAPSULE | Freq: Two times a day (BID) | ORAL | Status: DC
  Administered 2020-08-08 – 2020-08-13 (×11): 100 mg via ORAL
  Filled 2020-08-08 (×11): qty 1

## 2020-08-08 MED ORDER — DEXAMETHASONE SODIUM PHOSPHATE 4 MG/ML IJ SOLN
4.0000 mg | Freq: Four times a day (QID) | INTRAMUSCULAR | Status: DC
  Administered 2020-08-08 – 2020-08-09 (×5): 4 mg via INTRAVENOUS
  Filled 2020-08-08 (×5): qty 1

## 2020-08-08 MED ORDER — THROMBIN 5000 UNITS EX SOLR
OROMUCOSAL | Status: DC | PRN
  Administered 2020-08-08: 5 mL via TOPICAL

## 2020-08-08 MED ORDER — PREGABALIN 75 MG PO CAPS
150.0000 mg | ORAL_CAPSULE | Freq: Three times a day (TID) | ORAL | Status: DC
  Administered 2020-08-08 – 2020-08-13 (×17): 150 mg via ORAL
  Filled 2020-08-08 (×17): qty 2

## 2020-08-08 MED ORDER — INSULIN ASPART 100 UNIT/ML ~~LOC~~ SOLN
0.0000 [IU] | Freq: Three times a day (TID) | SUBCUTANEOUS | Status: DC
  Administered 2020-08-08: 5 [IU] via SUBCUTANEOUS
  Administered 2020-08-08: 3 [IU] via SUBCUTANEOUS
  Administered 2020-08-08: 2 [IU] via SUBCUTANEOUS
  Administered 2020-08-08 – 2020-08-09 (×5): 3 [IU] via SUBCUTANEOUS
  Administered 2020-08-10: 2 [IU] via SUBCUTANEOUS
  Administered 2020-08-10 – 2020-08-11 (×5): 3 [IU] via SUBCUTANEOUS
  Administered 2020-08-11: 2 [IU] via SUBCUTANEOUS
  Administered 2020-08-11 – 2020-08-12 (×2): 3 [IU] via SUBCUTANEOUS
  Administered 2020-08-12: 2 [IU] via SUBCUTANEOUS
  Administered 2020-08-12: 5 [IU] via SUBCUTANEOUS
  Administered 2020-08-13: 3 [IU] via SUBCUTANEOUS

## 2020-08-08 MED ORDER — NICOTINE 21 MG/24HR TD PT24
21.0000 mg | MEDICATED_PATCH | Freq: Every day | TRANSDERMAL | Status: DC
  Administered 2020-08-08 – 2020-08-13 (×6): 21 mg via TRANSDERMAL
  Filled 2020-08-08 (×6): qty 1

## 2020-08-08 MED ORDER — MOMETASONE FURO-FORMOTEROL FUM 100-5 MCG/ACT IN AERO
2.0000 | INHALATION_SPRAY | Freq: Two times a day (BID) | RESPIRATORY_TRACT | Status: DC
  Administered 2020-08-08 – 2020-08-09 (×3): 2 via RESPIRATORY_TRACT
  Filled 2020-08-08 (×2): qty 8.8

## 2020-08-08 MED ORDER — CYCLOBENZAPRINE HCL 10 MG PO TABS
10.0000 mg | ORAL_TABLET | Freq: Three times a day (TID) | ORAL | Status: DC | PRN
  Administered 2020-08-08 – 2020-08-13 (×12): 10 mg via ORAL
  Filled 2020-08-08 (×12): qty 1

## 2020-08-08 MED ORDER — ONDANSETRON HCL 4 MG PO TABS: 4.0000 mg | ORAL_TABLET | Freq: Four times a day (QID) | ORAL | Status: DC | PRN

## 2020-08-08 MED ORDER — ACETAMINOPHEN 650 MG RE SUPP: 650.0000 mg | Freq: Four times a day (QID) | RECTAL | Status: DC | PRN

## 2020-08-08 MED ORDER — ACETAMINOPHEN 10 MG/ML IV SOLN
INTRAVENOUS | Status: DC | PRN
  Administered 2020-08-08: 1000 mg via INTRAVENOUS

## 2020-08-08 MED ORDER — OXYCODONE HCL 5 MG PO TABS
5.0000 mg | ORAL_TABLET | ORAL | Status: DC | PRN
  Administered 2020-08-08 – 2020-08-10 (×10): 5 mg via ORAL
  Filled 2020-08-08 (×10): qty 1

## 2020-08-08 MED ORDER — NICOTINE POLACRILEX 2 MG MT GUM
2.0000 mg | CHEWING_GUM | OROMUCOSAL | Status: DC | PRN
  Filled 2020-08-08: qty 1

## 2020-08-08 MED ORDER — GALCANEZUMAB-GNLM 120 MG/ML ~~LOC~~ SOAJ: 120.0000 mg | SUBCUTANEOUS | Status: DC

## 2020-08-08 MED ORDER — RISAQUAD PO CAPS
1.0000 | ORAL_CAPSULE | Freq: Every day | ORAL | Status: DC
  Administered 2020-08-08 – 2020-08-13 (×6): 1 via ORAL
  Filled 2020-08-08 (×6): qty 1

## 2020-08-08 MED ORDER — ALBUTEROL SULFATE HFA 108 (90 BASE) MCG/ACT IN AERS
2.0000 | INHALATION_SPRAY | Freq: Four times a day (QID) | RESPIRATORY_TRACT | Status: DC | PRN
  Administered 2020-08-09: 2 via RESPIRATORY_TRACT
  Filled 2020-08-08: qty 6.7

## 2020-08-08 MED ORDER — ONDANSETRON HCL 4 MG/2ML IJ SOLN
INTRAMUSCULAR | Status: DC | PRN
  Administered 2020-08-08: 4 mg via INTRAVENOUS

## 2020-08-08 MED ORDER — SUGAMMADEX SODIUM 200 MG/2ML IV SOLN
INTRAVENOUS | Status: DC | PRN
  Administered 2020-08-08: 200 mg via INTRAVENOUS

## 2020-08-08 MED ORDER — ATORVASTATIN CALCIUM 40 MG PO TABS
40.0000 mg | ORAL_TABLET | Freq: Every day | ORAL | Status: DC
  Administered 2020-08-08 – 2020-08-13 (×6): 40 mg via ORAL
  Filled 2020-08-08 (×6): qty 1

## 2020-08-08 MED ORDER — PROMETHAZINE HCL 25 MG/ML IJ SOLN
6.2500 mg | INTRAMUSCULAR | Status: DC | PRN
Start: 2020-08-08 — End: 2020-08-08

## 2020-08-08 MED ORDER — SITAGLIPTIN PHOS-METFORMIN HCL 50-1000 MG PO TABS: 1.0000 | ORAL_TABLET | Freq: Two times a day (BID) | ORAL | Status: DC

## 2020-08-08 MED ORDER — HYDRALAZINE HCL 20 MG/ML IJ SOLN: 10.0000 mg | Freq: Four times a day (QID) | INTRAMUSCULAR | Status: DC | PRN

## 2020-08-08 NOTE — Progress Notes (Signed)
Subjective: Patient reports Patient reports not much change  Objective: Vital signs in last 24 hours: Temp:  [97 F (36.1 C)-97.9 F (36.6 C)] 97.9 F (36.6 C) (01/13 0400) Pulse Rate:  [86-112] 105 (01/13 0400) Resp:  [15-26] 16 (01/13 0400) BP: (105-131)/(57-98) 117/89 (01/13 0400) SpO2:  [86 %-97 %] 97 % (01/13 0400)  Intake/Output from previous day: 01/12 0701 - 01/13 0700 In: 1700 [I.V.:1700] Out: 1560 [Urine:1460; Blood:100] Intake/Output this shift: No intake/output data recorded.  Motor exam is back at baseline I think she has increased mobility in her hands and increased dexterity.  Incision clean dry and intact  Lab Results: Recent Labs    08/07/20 1726  WBC 8.4  HGB 15.7*  HCT 49.3*  PLT 284   BMET Recent Labs    08/07/20 1726  NA 140  K 3.9  CL 99  CO2 30  GLUCOSE 122*  BUN 5*  CREATININE 0.53  CALCIUM 9.6    Studies/Results: CT CERVICAL SPINE WO CONTRAST  Result Date: 08/07/2020 CLINICAL DATA:  Spinal cord injury follow-up. Unable to walk for 1 day. EXAM: CT CERVICAL SPINE WITHOUT CONTRAST TECHNIQUE: Multidetector CT imaging of the cervical spine was performed without intravenous contrast. Multiplanar CT image reconstructions were also generated. COMPARISON:  Cervical spine MRI 08/07/2020 FINDINGS: Alignment: Normal Skull base and vertebrae: No acute fracture. No primary bone lesion or focal pathologic process. Soft tissues and spinal canal: Calcific atherosclerosis. Disc levels: Severe spinal canal stenosis at the C2-C3 level is better characterized on the earlier MRI. Facet arthrosis is worse on the left most notably at C4-5 where there is facet fusion. Upper chest: Negative Other: None IMPRESSION: 1. No acute fracture or static subluxation of the cervical spine. 2. Severe spinal canal stenosis at C2-C3, better characterized on the earlier MRI. Aortic Atherosclerosis (ICD10-I70.0). Electronically Signed   By: Ulyses Jarred M.D.   On: 08/07/2020 22:41    MR Cervical Spine Wo Contrast  Result Date: 08/07/2020 CLINICAL DATA:  Spinal stenosis MRI brain EXAM: MRI CERVICAL SPINE WITHOUT CONTRAST TECHNIQUE: Multiplanar, multisequence MR imaging of the cervical spine was performed. No intravenous contrast was administered. COMPARISON:  July 08, 2019 FINDINGS: Significant motion artifact is present. Alignment: Stable. Vertebrae: There is marrow edema at the posing C2-C3 endplates. There is also marrow edema involving C2 and C3 posterior elements. Stable vertebral body heights. Cord: Cord compression at C2-C3 with abnormal cord signal. Posterior Fossa, vertebral arteries, paraspinal tissues: Mild paraspinal edema adjacent to the C2-C3 facets. Otherwise unremarkable. Disc levels: C2-C3: Disc bulge eccentric to the right with endplate osteophytes. Facet and uncovertebral hypertrophy with ligamentum flavum thickening. Small subcentimeter area of cystic change within the ligamentum flavum superior to disc level. Severe canal stenosis with cord compression. Probable right much greater than left foraminal stenosis. C3-C4: Disc bulge with endplate osteophytes. Facet and uncovertebral hypertrophy. Mild to moderate canal stenosis. Probable left greater than right foraminal stenosis. C4-C5: Disc bulge with endplate osteophytes. Uncovertebral and facet hypertrophy. No canal stenosis. Probable bilateral foraminal stenosis. C5-C6: Disc bulge with endplate osteophytes. Canal left foraminal stenosis. Probable right foraminal stenosis. C6-C7: Disc bulge with endplate osteophytes. No canal foraminal stenosis. C7-T1: Disc bulge with endplate osteophytes. No canal or foraminal stenosis. IMPRESSION: Significantly motion degraded study. Severe canal stenosis at C2-C3 is confirmed with cord compression and abnormal cord signal reflecting edema or myelomalacia. Electronically Signed   By: Macy Mis M.D.   On: 08/07/2020 19:51    Assessment/Plan: Postop day 1 posterior cervical  decompression  C1-C3 mobilize with physical Occupational Therapy today  LOS: 0 days     Elaina Hoops 08/08/2020, 8:08 AM
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
## 2020-08-08 NOTE — Op Note (Signed)
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  So due to patient's progressive quadriparesis to where she became dominant and ambulatory over the last 24 hours I recommended emergent decompressive laminectomy at C2-3 extending up to the inferior aspect of C1 and down to the inferior aspect of C3.  I extensively over the risks and benefits of this procedure with her as well as perioperative course expectations of outcome and alternatives of surgery and she understood and agreed to proceed forward.  Operative procedure: Patient was brought into the OR was [facility] general anesthesia positioned prone in pins the backside of her head neck was shaved prepped and draped in routine sterile fashion a midline incision was made from just inferior to the inion of the skull base down to approximate the spinous process of C4.  Subcutaneous tissue and subperiosteal dissection was carried out to expose the lamina of C3 C2 and the ring of C1 the C2 lamina and spinous process appeared to be discontiguous to where may be a remote fracture had disarticulated the spinous process from the C2 lamina.  So I started by removing the spinous process of C3 drilled down the lamina of C3 and initiated my decompression inferior to C3 identified native dura and marched superiorly drilling down and thinning of the lamina and marching  up the gutters allow me to remove large compressive spurs dorsally off the spinal cord en bloc.  I extended all the way up remove the inferior one third of the ring of C1 to where the dura was appearing normal aggressively removed the scar tissue and inflammatory tissue in the gutters marching laterally to widely decompress the spinal cord from C1 down to below C3.  After adequate decompression achieved the wound was copiously irrigated meticulous hemostasis was maintained Gelfoam was overlaid top of the dura and the wound was closed in layers with opted Vicryl in a running 4 subcuticular.  Dermabond benzoin Steri-Strips and sterile dressing was applied patient recovery in stable condition.  At the end of the case all needle count sponge counts were correct.

## 2020-08-08 NOTE — Anesthesia Postprocedure Evaluation (Signed)
Anesthesia Post Note  Patient: Margaret Vaughn  Procedure(s) Performed: POSTERIOR CERVICAL LAMINECTOMY FOR MYELOPATHY (N/A Neck)     Patient location during evaluation: PACU Anesthesia Type: General Level of consciousness: awake Pain management: pain level controlled Vital Signs Assessment: post-procedure vital signs reviewed and stable Respiratory status: spontaneous breathing, nonlabored ventilation, respiratory function stable and patient connected to nasal cannula oxygen Cardiovascular status: blood pressure returned to baseline, stable and tachycardic Postop Assessment: no apparent nausea or vomiting Anesthetic complications: no   No complications documented.  Last Vitals:  Vitals:   08/08/20 0215 08/08/20 0230  BP: (!) 117/57 117/75  Pulse: (!) 108 (!) 105  Resp: (!) 22 17  Temp:    SpO2: 95% 96%    Last Pain:  Vitals:   08/08/20 0215  TempSrc:   PainSc: Asleep                 Catalina Gravel
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
## 2020-08-08 NOTE — Transfer of Care (Signed)
Immediate Anesthesia Transfer of Care Note  Patient: Margaret Vaughn  Procedure(s) Performed: POSTERIOR CERVICAL LAMINECTOMY FOR MYELOPATHY (N/A Neck)  Patient Location: PACU  Anesthesia Type:General  Level of Consciousness: sedated and patient cooperative  Airway & Oxygen Therapy: Patient connected to nasal cannula oxygen  Post-op Assessment: Report given to RN and Post -op Vital signs reviewed and stable  Post vital signs: Reviewed and stable  Last Vitals:  Vitals Value Taken Time  BP 131/73 08/08/20 0153  Temp    Pulse 112 08/08/20 0156  Resp 23 08/08/20 0156  SpO2 95 % 08/08/20 0156  Vitals shown include unvalidated device data.  Last Pain:  Vitals:   08/07/20 2039  TempSrc: Oral  PainSc:          Complications: No complications documented.
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
## 2020-08-09 DIAGNOSIS — J449 Chronic obstructive pulmonary disease, unspecified: Secondary | ICD-10-CM

## 2020-08-09 LAB — GLUCOSE, CAPILLARY
Glucose-Capillary: 171 mg/dL — ABNORMAL HIGH (ref 70–99)
Glucose-Capillary: 190 mg/dL — ABNORMAL HIGH (ref 70–99)
Glucose-Capillary: 192 mg/dL — ABNORMAL HIGH (ref 70–99)
Glucose-Capillary: 178 mg/dL — ABNORMAL HIGH (ref 70–99)

## 2020-08-09 MED ORDER — METHYLPREDNISOLONE 4 MG PO TBPK
4.0000 mg | ORAL_TABLET | ORAL | Status: AC
  Administered 2020-08-09: 4 mg via ORAL

## 2020-08-09 MED ORDER — METHYLPREDNISOLONE 4 MG PO TBPK
8.0000 mg | ORAL_TABLET | Freq: Every morning | ORAL | Status: AC
  Administered 2020-08-09: 8 mg via ORAL
  Filled 2020-08-09: qty 21

## 2020-08-09 MED ORDER — METHYLPREDNISOLONE 4 MG PO TBPK
8.0000 mg | ORAL_TABLET | Freq: Every evening | ORAL | Status: AC
  Administered 2020-08-09: 8 mg via ORAL

## 2020-08-09 MED ORDER — METHYLPREDNISOLONE 4 MG PO TBPK
8.0000 mg | ORAL_TABLET | Freq: Every evening | ORAL | Status: AC
  Administered 2020-08-10: 8 mg via ORAL

## 2020-08-09 MED ORDER — METHYLPREDNISOLONE 4 MG PO TBPK
4.0000 mg | ORAL_TABLET | Freq: Four times a day (QID) | ORAL | Status: DC
  Administered 2020-08-11 – 2020-08-13 (×8): 4 mg via ORAL
  Filled 2020-08-09: qty 21

## 2020-08-09 MED ORDER — METHYLPREDNISOLONE 4 MG PO TBPK
4.0000 mg | ORAL_TABLET | Freq: Three times a day (TID) | ORAL | Status: AC
  Administered 2020-08-10 (×3): 4 mg via ORAL
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
## 2020-08-09 NOTE — Progress Notes (Signed)
Subjective: Patient reports Patient doing better she reports increased range of motion arms and hands and fine motor control improved  Objective: Vital signs in last 24 hours: Temp:  [97.8 F (36.6 C)-99 F (37.2 C)] 98.3 F (36.8 C) (01/14 0332) Pulse Rate:  [101-129] 108 (01/14 0800) Resp:  [12-29] 21 (01/14 0800) BP: (102-131)/(71-98) 131/90 (01/14 0800) SpO2:  [88 %-95 %] 94 % (01/14 0858) FiO2 (%):  [36 %] 36 % (01/14 0858)  Intake/Output from previous day: 01/13 0701 - 01/14 0700 In: 480 [P.O.:480] Out: 3775 [Urine:3775] Intake/Output this shift: No intake/output data recorded.  Strength slightly improved upper extremity and hands lower extremities also incision clean dry and intact  Lab Results: Recent Labs    08/07/20 1726  WBC 8.4  HGB 15.7*  HCT 49.3*  PLT 284   BMET Recent Labs    08/07/20 1726  NA 140  K 3.9  CL 99  CO2 30  GLUCOSE 122*  BUN 5*  CREATININE 0.53  CALCIUM 9.6    Studies/Results: CT CERVICAL SPINE WO CONTRAST  Result Date: 08/07/2020 CLINICAL DATA:  Spinal cord injury follow-up. Unable to walk for 1 day. EXAM: CT CERVICAL SPINE WITHOUT CONTRAST TECHNIQUE: Multidetector CT imaging of the cervical spine was performed without intravenous contrast. Multiplanar CT image reconstructions were also generated. COMPARISON:  Cervical spine MRI 08/07/2020 FINDINGS: Alignment: Normal Skull base and vertebrae: No acute fracture. No primary bone lesion or focal pathologic process. Soft tissues and spinal canal: Calcific atherosclerosis. Disc levels: Severe spinal canal stenosis at the C2-C3 level is better characterized on the earlier MRI. Facet arthrosis is worse on the left most notably at C4-5 where there is facet fusion. Upper chest: Negative Other: None IMPRESSION: 1. No acute fracture or static subluxation of the cervical spine. 2. Severe spinal canal stenosis at C2-C3, better characterized on the earlier MRI. Aortic Atherosclerosis (ICD10-I70.0).  Electronically Signed   By: Ulyses Jarred M.D.   On: 08/07/2020 22:41   MR Cervical Spine Wo Contrast  Result Date: 08/07/2020 CLINICAL DATA:  Spinal stenosis MRI brain EXAM: MRI CERVICAL SPINE WITHOUT CONTRAST TECHNIQUE: Multiplanar, multisequence MR imaging of the cervical spine was performed. No intravenous contrast was administered. COMPARISON:  July 08, 2019 FINDINGS: Significant motion artifact is present. Alignment: Stable. Vertebrae: There is marrow edema at the posing C2-C3 endplates. There is also marrow edema involving C2 and C3 posterior elements. Stable vertebral body heights. Cord: Cord compression at C2-C3 with abnormal cord signal. Posterior Fossa, vertebral arteries, paraspinal tissues: Mild paraspinal edema adjacent to the C2-C3 facets. Otherwise unremarkable. Disc levels: C2-C3: Disc bulge eccentric to the right with endplate osteophytes. Facet and uncovertebral hypertrophy with ligamentum flavum thickening. Small subcentimeter area of cystic change within the ligamentum flavum superior to disc level. Severe canal stenosis with cord compression. Probable right much greater than left foraminal stenosis. C3-C4: Disc bulge with endplate osteophytes. Facet and uncovertebral hypertrophy. Mild to moderate canal stenosis. Probable left greater than right foraminal stenosis. C4-C5: Disc bulge with endplate osteophytes. Uncovertebral and facet hypertrophy. No canal stenosis. Probable bilateral foraminal stenosis. C5-C6: Disc bulge with endplate osteophytes. Canal left foraminal stenosis. Probable right foraminal stenosis. C6-C7: Disc bulge with endplate osteophytes. No canal foraminal stenosis. C7-T1: Disc bulge with endplate osteophytes. No canal or foraminal stenosis. IMPRESSION: Significantly motion degraded study. Severe canal stenosis at C2-C3 is confirmed with cord compression and abnormal cord signal reflecting edema or myelomalacia. Electronically Signed   By: Macy Mis M.D.   On:  08/07/2020  19:51    Assessment/Plan: Postop day 2 decompressive cervical laminectomy with slow improvement continue to work with physical and Occupational Therapy.  Patient does not need her collar when she is in bed I will prefer her to wear it when she is up and about and working with therapy.  LOS: 1 day     Elaina Hoops 08/09/2020, 9:58 AM
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
## 2020-08-09 NOTE — Progress Notes (Signed)
Rehab Admissions Coordinator Note:  Per PT and OT recommendation, this patient was screened by Raechel Ache for appropriateness for an Inpatient Acute Rehab Consult.  At this time, we are recommending Inpatient Rehab consult. Please have attending service place consult order if you would like for this patient to be considered for CIR.   Raechel Ache 08/09/2020, 5:51 PM  I can be reached at [phone].

## 2020-08-10 LAB — GLUCOSE, CAPILLARY
Glucose-Capillary: 195 mg/dL — ABNORMAL HIGH (ref 70–99)
Glucose-Capillary: 123 mg/dL — ABNORMAL HIGH (ref 70–99)
Glucose-Capillary: 162 mg/dL — ABNORMAL HIGH (ref 70–99)
Glucose-Capillary: 187 mg/dL — ABNORMAL HIGH (ref 70–99)

## 2020-08-10 MED ORDER — OXYCODONE HCL ER 10 MG PO T12A
20.0000 mg | EXTENDED_RELEASE_TABLET | Freq: Two times a day (BID) | ORAL | Status: DC
  Administered 2020-08-10 – 2020-08-12 (×5): 20 mg via ORAL
  Filled 2020-08-10 (×6): qty 2

## 2020-08-10 MED ORDER — UMECLIDINIUM BROMIDE 62.5 MCG/INH IN AEPB
1.0000 | INHALATION_SPRAY | Freq: Every day | RESPIRATORY_TRACT | Status: DC
  Administered 2020-08-11 – 2020-08-13 (×3): 1 via RESPIRATORY_TRACT

## 2020-08-10 MED ORDER — OXYCODONE HCL 5 MG PO TABS
5.0000 mg | ORAL_TABLET | Freq: Four times a day (QID) | ORAL | Status: DC | PRN
  Administered 2020-08-11 – 2020-08-13 (×8): 5 mg via ORAL
  Filled 2020-08-10 (×8): qty 1

## 2020-08-10 MED ORDER — MOMETASONE FURO-FORMOTEROL FUM 100-5 MCG/ACT IN AERO
2.0000 | INHALATION_SPRAY | Freq: Two times a day (BID) | RESPIRATORY_TRACT | Status: DC
  Administered 2020-08-10 – 2020-08-13 (×6): 2 via RESPIRATORY_TRACT

## 2020-08-10 MED ORDER — OXYCODONE-ACETAMINOPHEN 5-325 MG PO TABS
1.0000 | ORAL_TABLET | Freq: Four times a day (QID) | ORAL | Status: DC | PRN
  Administered 2020-08-11 – 2020-08-13 (×8): 1 via ORAL
  Filled 2020-08-10 (×8): qty 1
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
## 2020-08-10 NOTE — Progress Notes (Signed)
Inpatient Rehab Admissions Coordinator:   I met with Pt. To discuss potential CIR admission. She stated interest but wants me to call and speak with her husband first. I will call Pt.'s husband and discuss with him as well.  If pt. And family are agreeable to come to rehab, will pursue for admit later this week, pending bed availability.   Clemens Catholic, [place], [place] Admissions Coordinator  [phone] ([place]) [phone] (office)

## 2020-08-10 NOTE — Progress Notes (Signed)
Patient ID: Margaret Vaughn, female   DOB: 1961-02-09, 60 y.o.   MRN: [id number] BP 130/87 (BP Location: Left Arm)   Pulse 98   Temp 97.7 F (36.5 C) (Oral)   Resp 13   SpO2 94%  Alert, oriented x 4 Moving upper extremities, complaining of numbness Wound is clean and dry Will need continued therapy

## 2020-08-11 LAB — GLUCOSE, CAPILLARY
Glucose-Capillary: 164 mg/dL — ABNORMAL HIGH (ref 70–99)
Glucose-Capillary: 158 mg/dL — ABNORMAL HIGH (ref 70–99)
Glucose-Capillary: 122 mg/dL — ABNORMAL HIGH (ref 70–99)
Glucose-Capillary: 167 mg/dL — ABNORMAL HIGH (ref 70–99)

## 2020-08-11 MED ORDER — NYSTATIN 100000 UNIT/ML MT SUSP
5.0000 mL | Freq: Four times a day (QID) | OROMUCOSAL | Status: DC
  Administered 2020-08-11 – 2020-08-13 (×11): 500000 [IU] via ORAL
  Filled 2020-08-11 (×10): qty 5

## 2020-08-11 NOTE — Progress Notes (Signed)
Subjective: Patient reports pain better controlled  Objective: Vital signs in last 24 hours: Temp:  [97.5 F (36.4 C)-98.5 F (36.9 C)] 98.5 F (36.9 C) (01/16 0720) Pulse Rate:  [89-107] 96 (01/16 0720) Resp:  [15-20] 15 (01/16 0720) BP: (114-142)/(83-94) 139/92 (01/16 0720) SpO2:  [93 %-97 %] 93 % (01/16 0810)  Intake/Output from previous day: 01/15 0701 - 01/16 0700 In: 930 [P.O.:930] Out: 1450 [Urine:1450] Intake/Output this shift: Total I/O In: 360 [P.O.:360] Out: -   Incision c/d C-collar appears to be fitting well. Shoulder padding in place Decreased dexterity in hands On O2 by [place]  Lab Results: No results for input(s): WBC, HGB, HCT, PLT in the last 72 hours. BMET No results for input(s): NA, K, CL, CO2, GLUCOSE, BUN, CREATININE, CALCIUM in the last 72 hours.  Studies/Results: No results found.  Assessment/Plan: S/p posterior cervical decompression - cont PT/OT - likely rehab candidate   LOS: 3 days     Vallarie Mare 08/11/2020, 11:15 AM
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
## 2020-08-12 LAB — GLUCOSE, CAPILLARY
Glucose-Capillary: 116 mg/dL — ABNORMAL HIGH (ref 70–99)
Glucose-Capillary: 125 mg/dL — ABNORMAL HIGH (ref 70–99)
Glucose-Capillary: 132 mg/dL — ABNORMAL HIGH (ref 70–99)
Glucose-Capillary: 203 mg/dL — ABNORMAL HIGH (ref 70–99)
Glucose-Capillary: 159 mg/dL — ABNORMAL HIGH (ref 70–99)
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
## 2020-08-12 NOTE — Progress Notes (Signed)
Subjective: Patient reports some improvement in strength. Still has numbnes "all over" having a difficult time telling when she is cold   Objective: Vital signs in last 24 hours: Temp:  [97.7 F (36.5 C)-98.5 F (36.9 C)] 97.7 F (36.5 C) (01/17 0652) Pulse Rate:  [84-94] 84 (01/17 0652) Resp:  [16-20] 16 (01/17 0652) BP: (117-132)/(74-90) 121/85 (01/17 0652) SpO2:  [93 %-96 %] 94 % (01/17 0652)  Intake/Output from previous day: 01/16 0701 - 01/17 0700 In: 660 [P.O.:660] Out: 1825 [Urine:1825] Intake/Output this shift: No intake/output data recorded.  Neurologic: Strength has definitely improved since admission. Deltoids now 4/5, biceps and triceps 4/5, hand grips 4/5  Lab Results: Lab Results  Component Value Date   WBC 8.4 08/07/2020   HGB 15.7 (H) 08/07/2020   HCT 49.3 (H) 08/07/2020   MCV 91.6 08/07/2020   PLT 284 08/07/2020   No results found for: INR, PROTIME BMET Lab Results  Component Value Date   NA 140 08/07/2020   K 3.9 08/07/2020   CL 99 08/07/2020   CO2 30 08/07/2020   GLUCOSE 122 (H) 08/07/2020   BUN 5 (L) 08/07/2020   CREATININE 0.53 08/07/2020   CALCIUM 9.6 08/07/2020    Studies/Results: No results found.  Assessment/Plan: Doing well and improving slowly, strength has definitely gotten better.  Continue therapy. Will plan for CIR as she is a good candidate   LOS: 4 days    Ocie Cornfield Elhadj Girton 08/12/2020, 11:01 AM

## 2020-08-12 NOTE — Progress Notes (Signed)
Inpatient Rehab Admissions Coordinator:    I spoke with Pt. And her husband over the phone to notify them that I do not have a bed for Pt.  Today but am hopeful that we will be able to admit her tomorrow or later this week. I also notified her that rehab MD has approved her request to have 2 visitors (her husband and son) during her CIR admission due to need for her son to be with a parent at all times.  Clemens Catholic, [place], [place] Admissions Coordinator  [phone] ([place]) [phone] (office)
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
## 2020-08-13 ENCOUNTER — Inpatient Hospital Stay (HOSPITAL_COMMUNITY)
Admission: RE | Admit: 2020-08-13 | Discharge: 2020-08-14 | DRG: 052 | Discharge status: Against Medical Advice | Payer: Medicaid Other | Source: Intra-hospital | Attending: Physical Medicine and Rehabilitation | Admitting: Physical Medicine and Rehabilitation

## 2020-08-13 ENCOUNTER — Inpatient Hospital Stay (HOSPITAL_COMMUNITY): Payer: Medicaid Other

## 2020-08-13 DIAGNOSIS — Z90722 Acquired absence of ovaries, bilateral: Secondary | ICD-10-CM

## 2020-08-13 DIAGNOSIS — Z83438 Family history of other disorder of lipoprotein metabolism and other lipidemia: Secondary | ICD-10-CM

## 2020-08-13 DIAGNOSIS — M62838 Other muscle spasm: Secondary | ICD-10-CM | POA: Diagnosis not present

## 2020-08-13 DIAGNOSIS — G47419 Narcolepsy without cataplexy: Secondary | ICD-10-CM | POA: Diagnosis not present

## 2020-08-13 DIAGNOSIS — Z90710 Acquired absence of both cervix and uterus: Secondary | ICD-10-CM

## 2020-08-13 DIAGNOSIS — G8252 Quadriplegia, C1-C4 incomplete: Principal | ICD-10-CM | POA: Diagnosis present

## 2020-08-13 DIAGNOSIS — Z9049 Acquired absence of other specified parts of digestive tract: Secondary | ICD-10-CM

## 2020-08-13 DIAGNOSIS — Z9981 Dependence on supplemental oxygen: Secondary | ICD-10-CM | POA: Diagnosis not present

## 2020-08-13 DIAGNOSIS — I10 Essential (primary) hypertension: Secondary | ICD-10-CM | POA: Diagnosis present

## 2020-08-13 DIAGNOSIS — E1142 Type 2 diabetes mellitus with diabetic polyneuropathy: Secondary | ICD-10-CM | POA: Diagnosis present

## 2020-08-13 DIAGNOSIS — G629 Polyneuropathy, unspecified: Secondary | ICD-10-CM

## 2020-08-13 DIAGNOSIS — T2121XA Burn of second degree of chest wall, initial encounter: Secondary | ICD-10-CM | POA: Diagnosis present

## 2020-08-13 DIAGNOSIS — M4802 Spinal stenosis, cervical region: Secondary | ICD-10-CM | POA: Diagnosis present

## 2020-08-13 DIAGNOSIS — Z79899 Other long term (current) drug therapy: Secondary | ICD-10-CM

## 2020-08-13 DIAGNOSIS — J9610 Chronic respiratory failure, unspecified whether with hypoxia or hypercapnia: Secondary | ICD-10-CM | POA: Diagnosis not present

## 2020-08-13 DIAGNOSIS — Z91041 Radiographic dye allergy status: Secondary | ICD-10-CM

## 2020-08-13 DIAGNOSIS — Z8249 Family history of ischemic heart disease and other diseases of the circulatory system: Secondary | ICD-10-CM

## 2020-08-13 DIAGNOSIS — K219 Gastro-esophageal reflux disease without esophagitis: Secondary | ICD-10-CM | POA: Diagnosis present

## 2020-08-13 DIAGNOSIS — Z881 Allergy status to other antibiotic agents status: Secondary | ICD-10-CM | POA: Diagnosis not present

## 2020-08-13 DIAGNOSIS — Z79891 Long term (current) use of opiate analgesic: Secondary | ICD-10-CM

## 2020-08-13 DIAGNOSIS — T2122XA Burn of second degree of abdominal wall, initial encounter: Secondary | ICD-10-CM | POA: Diagnosis present

## 2020-08-13 DIAGNOSIS — Z91013 Allergy to seafood: Secondary | ICD-10-CM

## 2020-08-13 DIAGNOSIS — Z7951 Long term (current) use of inhaled steroids: Secondary | ICD-10-CM

## 2020-08-13 DIAGNOSIS — X088XXD Exposure to other specified smoke, fire and flames, subsequent encounter: Secondary | ICD-10-CM | POA: Diagnosis present

## 2020-08-13 DIAGNOSIS — F418 Other specified anxiety disorders: Secondary | ICD-10-CM | POA: Diagnosis present

## 2020-08-13 DIAGNOSIS — E785 Hyperlipidemia, unspecified: Secondary | ICD-10-CM | POA: Diagnosis present

## 2020-08-13 DIAGNOSIS — J449 Chronic obstructive pulmonary disease, unspecified: Secondary | ICD-10-CM | POA: Diagnosis present

## 2020-08-13 DIAGNOSIS — G4733 Obstructive sleep apnea (adult) (pediatric): Secondary | ICD-10-CM | POA: Diagnosis not present

## 2020-08-13 DIAGNOSIS — G959 Disease of spinal cord, unspecified: Secondary | ICD-10-CM | POA: Diagnosis present

## 2020-08-13 DIAGNOSIS — E1159 Type 2 diabetes mellitus with other circulatory complications: Secondary | ICD-10-CM | POA: Diagnosis present

## 2020-08-13 DIAGNOSIS — Z7984 Long term (current) use of oral hypoglycemic drugs: Secondary | ICD-10-CM | POA: Diagnosis not present

## 2020-08-13 DIAGNOSIS — K5900 Constipation, unspecified: Secondary | ICD-10-CM

## 2020-08-13 DIAGNOSIS — G894 Chronic pain syndrome: Secondary | ICD-10-CM | POA: Diagnosis present

## 2020-08-13 DIAGNOSIS — Z981 Arthrodesis status: Secondary | ICD-10-CM | POA: Diagnosis not present

## 2020-08-13 DIAGNOSIS — T2131XD Burn of third degree of chest wall, subsequent encounter: Secondary | ICD-10-CM

## 2020-08-13 LAB — GLUCOSE, CAPILLARY
Glucose-Capillary: 144 mg/dL — ABNORMAL HIGH (ref 70–99)
Glucose-Capillary: 162 mg/dL — ABNORMAL HIGH (ref 70–99)
Glucose-Capillary: 114 mg/dL — ABNORMAL HIGH (ref 70–99)
Glucose-Capillary: 191 mg/dL — ABNORMAL HIGH (ref 70–99)
Glucose-Capillary: 185 mg/dL — ABNORMAL HIGH (ref 70–99)
Glucose-Capillary: 194 mg/dL — ABNORMAL HIGH (ref 70–99)

## 2020-08-13 MED ORDER — UMECLIDINIUM BROMIDE 62.5 MCG/INH IN AEPB
1.0000 | INHALATION_SPRAY | Freq: Every day | RESPIRATORY_TRACT | Status: DC
  Administered 2020-08-14: 1 via RESPIRATORY_TRACT

## 2020-08-13 MED ORDER — DANTROLENE SODIUM 25 MG PO CAPS
50.0000 mg | ORAL_CAPSULE | Freq: Every day | ORAL | Status: DC
  Filled 2020-08-13: qty 2

## 2020-08-13 MED ORDER — ENALAPRIL MALEATE 5 MG PO TABS
5.0000 mg | ORAL_TABLET | Freq: Every day | ORAL | Status: DC
  Administered 2020-08-14: 5 mg via ORAL
  Filled 2020-08-13: qty 1

## 2020-08-13 MED ORDER — NYSTATIN 100000 UNIT/ML MT SUSP
5.0000 mL | Freq: Four times a day (QID) | OROMUCOSAL | Status: DC
  Administered 2020-08-13 – 2020-08-14 (×3): 500000 [IU] via ORAL
  Filled 2020-08-13 (×3): qty 5

## 2020-08-13 MED ORDER — SENNOSIDES-DOCUSATE SODIUM 8.6-50 MG PO TABS
2.0000 | ORAL_TABLET | Freq: Every day | ORAL | Status: DC
  Administered 2020-08-14: 2 via ORAL
  Filled 2020-08-13: qty 2

## 2020-08-13 MED ORDER — BISACODYL 10 MG RE SUPP
10.0000 mg | Freq: Every day | RECTAL | Status: DC
  Administered 2020-08-13: 10 mg via RECTAL
  Filled 2020-08-13: qty 1

## 2020-08-13 MED ORDER — NICOTINE POLACRILEX 2 MG MT GUM
2.0000 mg | CHEWING_GUM | OROMUCOSAL | Status: DC | PRN
  Filled 2020-08-13: qty 1

## 2020-08-13 MED ORDER — ALUM & MAG HYDROXIDE-SIMETH 200-200-20 MG/5ML PO SUSP: 30.0000 mL | ORAL | Status: DC | PRN

## 2020-08-13 MED ORDER — NICOTINE 21 MG/24HR TD PT24
21.0000 mg | MEDICATED_PATCH | Freq: Every day | TRANSDERMAL | Status: DC
  Administered 2020-08-14: 21 mg via TRANSDERMAL
  Filled 2020-08-13: qty 1

## 2020-08-13 MED ORDER — DM-GUAIFENESIN ER 30-600 MG PO TB12
1.0000 | ORAL_TABLET | Freq: Two times a day (BID) | ORAL | Status: DC
  Administered 2020-08-13 – 2020-08-14 (×2): 1 via ORAL
  Filled 2020-08-13 (×2): qty 1

## 2020-08-13 MED ORDER — TRAZODONE HCL 50 MG PO TABS: 25.0000 mg | ORAL_TABLET | Freq: Every evening | ORAL | Status: DC | PRN

## 2020-08-13 MED ORDER — PROCHLORPERAZINE MALEATE 5 MG PO TABS: 5.0000 mg | ORAL_TABLET | Freq: Four times a day (QID) | ORAL | Status: DC | PRN

## 2020-08-13 MED ORDER — ALBUTEROL SULFATE HFA 108 (90 BASE) MCG/ACT IN AERS
2.0000 | INHALATION_SPRAY | Freq: Four times a day (QID) | RESPIRATORY_TRACT | Status: DC | PRN
  Filled 2020-08-13: qty 6.7

## 2020-08-13 MED ORDER — OXYCODONE-ACETAMINOPHEN 5-325 MG PO TABS: 1.0000 | ORAL_TABLET | Freq: Four times a day (QID) | ORAL | Status: DC | PRN

## 2020-08-13 MED ORDER — CYCLOBENZAPRINE HCL 10 MG PO TABS: 10.0000 mg | ORAL_TABLET | Freq: Three times a day (TID) | ORAL | Status: DC | PRN

## 2020-08-13 MED ORDER — OXYCODONE HCL 5 MG PO TABS
5.0000 mg | ORAL_TABLET | ORAL | Status: DC | PRN
Start: 2020-08-13 — End: 2020-08-14
  Administered 2020-08-14 (×2): 5 mg via ORAL
  Filled 2020-08-13 (×2): qty 1

## 2020-08-13 MED ORDER — PROSOURCE PLUS PO LIQD
30.0000 mL | Freq: Two times a day (BID) | ORAL | Status: DC
  Administered 2020-08-14: 30 mL via ORAL
  Filled 2020-08-13: qty 30

## 2020-08-13 MED ORDER — PROCHLORPERAZINE EDISYLATE 10 MG/2ML IJ SOLN: 5.0000 mg | Freq: Four times a day (QID) | INTRAMUSCULAR | Status: DC | PRN

## 2020-08-13 MED ORDER — FLAVOXATE HCL 100 MG PO TABS
100.0000 mg | ORAL_TABLET | Freq: Three times a day (TID) | ORAL | Status: DC | PRN
  Filled 2020-08-13: qty 1

## 2020-08-13 MED ORDER — DIPHENHYDRAMINE HCL 12.5 MG/5ML PO ELIX: 12.5000 mg | ORAL_SOLUTION | Freq: Four times a day (QID) | ORAL | Status: DC | PRN

## 2020-08-13 MED ORDER — BISACODYL 10 MG RE SUPP: 10.0000 mg | Freq: Every day | RECTAL | Status: DC | PRN

## 2020-08-13 MED ORDER — FLEET ENEMA 7-19 GM/118ML RE ENEM: 1.0000 | ENEMA | Freq: Once | RECTAL | Status: DC | PRN

## 2020-08-13 MED ORDER — SORBITOL 70 % SOLN
30.0000 mL | Freq: Once | Status: AC
  Administered 2020-08-13: 30 mL via ORAL
  Filled 2020-08-13: qty 30

## 2020-08-13 MED ORDER — ADULT MULTIVITAMIN W/MINERALS CH
1.0000 | ORAL_TABLET | Freq: Every day | ORAL | Status: DC
  Administered 2020-08-14: 1 via ORAL
  Filled 2020-08-13: qty 1

## 2020-08-13 MED ORDER — ATORVASTATIN CALCIUM 40 MG PO TABS
40.0000 mg | ORAL_TABLET | Freq: Every day | ORAL | Status: DC
  Administered 2020-08-14: 40 mg via ORAL
  Filled 2020-08-13: qty 1

## 2020-08-13 MED ORDER — OXYCODONE HCL 5 MG PO TABS: 5.0000 mg | ORAL_TABLET | Freq: Four times a day (QID) | ORAL | Status: DC | PRN

## 2020-08-13 MED ORDER — SUMATRIPTAN SUCCINATE 50 MG PO TABS
50.0000 mg | ORAL_TABLET | ORAL | Status: DC | PRN
  Filled 2020-08-13: qty 1

## 2020-08-13 MED ORDER — INSULIN ASPART 100 UNIT/ML ~~LOC~~ SOLN: 0.0000 [IU] | Freq: Every day | SUBCUTANEOUS | Status: DC

## 2020-08-13 MED ORDER — RISAQUAD PO CAPS
1.0000 | ORAL_CAPSULE | Freq: Every day | ORAL | Status: DC
  Administered 2020-08-14: 1 via ORAL
  Filled 2020-08-13: qty 1

## 2020-08-13 MED ORDER — GUAIFENESIN-DM 100-10 MG/5ML PO SYRP: 5.0000 mL | ORAL_SOLUTION | Freq: Four times a day (QID) | ORAL | Status: DC | PRN

## 2020-08-13 MED ORDER — LINAGLIPTIN 5 MG PO TABS
5.0000 mg | ORAL_TABLET | Freq: Every day | ORAL | Status: DC
  Administered 2020-08-14: 5 mg via ORAL
  Filled 2020-08-13: qty 1

## 2020-08-13 MED ORDER — METFORMIN HCL 500 MG PO TABS
1000.0000 mg | ORAL_TABLET | Freq: Two times a day (BID) | ORAL | Status: DC
  Administered 2020-08-14: 1000 mg via ORAL
  Filled 2020-08-13: qty 2

## 2020-08-13 MED ORDER — SITAGLIPTIN PHOS-METFORMIN HCL 50-1000 MG PO TABS: 1.0000 | ORAL_TABLET | Freq: Two times a day (BID) | ORAL | Status: DC

## 2020-08-13 MED ORDER — METHYLPREDNISOLONE 4 MG PO TBPK
4.0000 mg | ORAL_TABLET | Freq: Four times a day (QID) | ORAL | Status: AC
  Administered 2020-08-13 (×2): 4 mg via ORAL

## 2020-08-13 MED ORDER — INSULIN ASPART 100 UNIT/ML ~~LOC~~ SOLN
0.0000 [IU] | Freq: Three times a day (TID) | SUBCUTANEOUS | Status: DC
  Administered 2020-08-13: 2 [IU] via SUBCUTANEOUS
  Administered 2020-08-14: 1 [IU] via SUBCUTANEOUS

## 2020-08-13 MED ORDER — ENSURE MAX PROTEIN PO LIQD
11.0000 [oz_av] | Freq: Two times a day (BID) | ORAL | Status: DC
  Administered 2020-08-14: 11 [oz_av] via ORAL

## 2020-08-13 MED ORDER — MOMETASONE FURO-FORMOTEROL FUM 100-5 MCG/ACT IN AERO
2.0000 | INHALATION_SPRAY | Freq: Two times a day (BID) | RESPIRATORY_TRACT | Status: DC
  Administered 2020-08-13 – 2020-08-14 (×2): 2 via RESPIRATORY_TRACT

## 2020-08-13 MED ORDER — SILVER SULFADIAZINE 1 % EX CREA
1.0000 "application " | TOPICAL_CREAM | Freq: Every day | CUTANEOUS | Status: DC
  Administered 2020-08-14: 1 via TOPICAL
  Filled 2020-08-13: qty 85

## 2020-08-13 MED ORDER — PROCHLORPERAZINE 25 MG RE SUPP: 12.5000 mg | Freq: Four times a day (QID) | RECTAL | Status: DC | PRN

## 2020-08-13 MED ORDER — POLYETHYLENE GLYCOL 3350 17 G PO PACK
17.0000 g | PACK | Freq: Every day | ORAL | Status: DC | PRN
Start: 2020-08-13 — End: 2020-08-14
  Administered 2020-08-14: 17 g via ORAL

## 2020-08-13 MED ORDER — ACETAMINOPHEN 325 MG PO TABS: 325.0000 mg | ORAL_TABLET | ORAL | Status: DC | PRN

## 2020-08-13 MED ORDER — MELATONIN 3 MG PO TABS
3.0000 mg | ORAL_TABLET | Freq: Every day | ORAL | Status: DC
  Administered 2020-08-13: 3 mg via ORAL
  Filled 2020-08-13: qty 1

## 2020-08-13 MED ORDER — PREGABALIN 75 MG PO CAPS
150.0000 mg | ORAL_CAPSULE | Freq: Three times a day (TID) | ORAL | Status: DC
  Administered 2020-08-14: 150 mg via ORAL
  Filled 2020-08-13: qty 2

## 2020-08-13 MED ORDER — BACLOFEN 5 MG HALF TABLET
15.0000 mg | ORAL_TABLET | Freq: Four times a day (QID) | ORAL | Status: DC
  Administered 2020-08-13 – 2020-08-14 (×3): 15 mg via ORAL
  Filled 2020-08-13 (×3): qty 1

## 2020-08-13 MED ORDER — VITAMIN D 25 MCG (1000 UNIT) PO TABS
1000.0000 [IU] | ORAL_TABLET | Freq: Every day | ORAL | Status: DC
  Administered 2020-08-14: 1000 [IU] via ORAL
  Filled 2020-08-13: qty 1

## 2020-08-13 MED ORDER — OXYCODONE HCL ER 10 MG PO T12A
20.0000 mg | EXTENDED_RELEASE_TABLET | Freq: Two times a day (BID) | ORAL | Status: DC
  Administered 2020-08-13 – 2020-08-14 (×2): 20 mg via ORAL
  Filled 2020-08-13 (×2): qty 2

## 2020-08-13 NOTE — TOC Transition Note (Signed)
Transition of Care (TOC) - CM/SW Discharge Note Marvetta Gibbons RN,BSN Transitions of Care Unit 4NP (non trauma) - RN Case Manager See Treatment Team for direct Phone #   Patient Details  Name: BERTINA GUTHRIDGE MRN: [id number] Date of Birth: 07/14/1961  Transition of Care [facility]) CM/SW Contact:  Dawayne Patricia, RN Phone Number: 08/13/2020, 12:05 PM   Clinical Narrative:    Pt stable for transition today to rehab. Have confirmed with [facility] INPT rehab that pt has bed available today for admit. Pt is agreeable and pt will transition to [place] rehab later today.     Final next level of care: IP Rehab Facility Barriers to Discharge: No Barriers Identified   Patient Goals and CMS Choice Patient states their goals for this hospitalization and ongoing recovery are:: rehab CMS Medicare.gov Compare Post Acute Care list provided to:: Patient Choice offered to / list presented to : Patient  Discharge Placement                  INPT rehab- [facility]      Discharge Plan and Services     Post Acute Care Choice: IP Rehab                    HH Arranged: NA HH Agency: NA        Social Determinants of Health (SDOH) Interventions     Readmission Risk Interventions Readmission Risk Prevention Plan 08/13/2020  Post Dischage Appt Complete  Medication Screening Complete  Transportation Screening Complete  Some recent data might be hidden
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
## 2020-08-13 NOTE — Progress Notes (Signed)
Orthopedic Tech Progress Note Patient Details:  Margaret Vaughn [birth year] [id number] RN called requesting a SOFT COLLAR. I cut it down a little for the comfort of patient Ortho Devices Type of Ortho Device: Soft collar Ortho Device/Splint Location: neck Ortho Device/Splint Interventions: Ordered,Application,Adjustment   Post Interventions Patient Tolerated: Well Instructions Provided: Care of device   Janit Pagan 08/13/2020, 1:19 PM
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
## 2020-08-13 NOTE — Progress Notes (Signed)
Occupational Therapy Treatment Patient Details Name: Margaret Vaughn MRN: [id number] DOB: March 28, 1961 Today's Date: 08/13/2020    History of present illness 60 yo female s/p PCD C1-3 PMH COPD 2L-5L O2 dependent HLD DM2 tobacco abuse memory loss DDD,   OT comments  Pt progressed to bathroom level transfer but unable to void. Pt educated on bowel and bladder schedule to help with voiding and knowing when transfers are needed. Pt verbalized desire to shower. Pt noted to have a smokeless tobacco vapor in the bed. Pt with tape on various items in the room to help with grasp. Recommendation for CIr remains.    Follow Up Recommendations  CIR    Equipment Recommendations  3 in 1 bedside commode;Wheelchair (measurements OT);Wheelchair cushion (measurements OT)    Recommendations for Other Services Rehab consult    Precautions / Restrictions Precautions Precautions: Fall;Cervical Required Braces or Orthoses: Cervical Brace Cervical Brace: Soft collar;Other (comment) (per dr Saintclair Halsted 1/18)       Mobility Bed Mobility Overal bed mobility: Needs Assistance Bed Mobility: Rolling;Sidelying to Sit Rolling: Min assist Sidelying to sit: Mod assist       General bed mobility comments: truncal assist side to sitting.  min assist to scoot to EOB with extra time.  Transfers Overall transfer level: Needs assistance Equipment used: Rolling walker (2 wheeled) Transfers: Sit to/from Stand Sit to Stand: Min assist Stand pivot transfers: Min assist;Mod assist       General transfer comment: decrease grasp on the RW or uncoordinated use of hand during the transfer in general.    Balance   Sitting-balance support: Bilateral upper extremity supported;Feet supported;No upper extremity supported Sitting balance-Leahy Scale: Fair Sitting balance - Comments: mild posterior bias which she can manage.     Standing balance-Leahy Scale: Poor Standing balance comment: reliant on AD and therapist                            ADL either performed or assessed with clinical judgement   ADL Overall ADL's : Needs assistance/impaired Eating/Feeding: Sitting;Minimal assistance   Grooming: Wash/dry hands;Minimal assistance;Standing Grooming Details (indicate cue type and reason): R gradual lean and needing more (A) with prolonged task                 Toilet Transfer: Moderate assistance;BSC;RW Toilet Transfer Details (indicate cue type and reason): pt requires (A) to advance RW and to stabilize trunk in RW         Functional mobility during ADLs: Moderate assistance;Rolling walker General ADL Comments: pt verbalized being frustrated with return and changes in sensation since surgery. pt noted to have smokeless tobacco product in the room / bed with her during session     Vision       Perception     Praxis      Cognition Arousal/Alertness: Awake/alert Behavior During Therapy: [facility] for tasks assessed/performed Overall Cognitive Status: Within Functional Limits for tasks assessed                                          Exercises Other Exercises Other Exercises: pt verbalized that rolled wash cloth as a grip has helped with self feeding. pt noted to have several in the room now   Shoulder Instructions       General Comments pt appeared to manage longer with less pain without  the hard miami J collar on.    Pertinent Vitals/ Pain       Pain Assessment: Faces Faces Pain Scale: Hurts even more Pain Location: general, neck shoulders Pain Descriptors / Indicators: Discomfort Pain Intervention(s): Monitored during session;Repositioned  Home Living                                          Prior Functioning/Environment              Frequency  Min 2X/week        Progress Toward Goals  OT Goals(current goals can now be found in the care plan section)  Progress towards OT goals: Progressing toward goals  Acute Rehab  OT Goals Patient Stated Goal: to have my sensation back OT Goal Formulation: With patient/family Time For Goal Achievement: 08/23/20 Potential to Achieve Goals: Good ADL Goals Pt Will Perform Grooming: with min assist;sitting;with adaptive equipment Pt Will Perform Upper Body Bathing: with min assist;sitting Pt Will Transfer to Toilet: with min assist;bedside commode;stand pivot transfer Pt/caregiver will Perform Home Exercise Program: Both right and left upper extremity;With minimal assist;With written HEP provided Additional ADL Goal #1: pt will complete bed mobility min guard as precursor to adls.  Plan Discharge plan remains appropriate    Co-evaluation    PT/OT/SLP Co-Evaluation/Treatment: Yes Reason for Co-Treatment: Necessary to address cognition/behavior during functional activity;For patient/therapist safety;To address functional/ADL transfers PT goals addressed during session: Mobility/safety with mobility OT goals addressed during session: ADL's and self-care;Proper use of Adaptive equipment and DME;Strengthening/ROM      AM-PAC OT "6 Clicks" Daily Activity     Outcome Measure   Help from another person eating meals?: A Little Help from another person taking care of personal grooming?: A Little Help from another person toileting, which includes using toliet, bedpan, or urinal?: A Lot Help from another person bathing (including washing, rinsing, drying)?: A Lot Help from another person to put on and taking off regular upper body clothing?: A Lot Help from another person to put on and taking off regular lower body clothing?: A Lot 6 Click Score: 14    End of Session Equipment Utilized During Treatment: Gait belt;Rolling walker  OT Visit Diagnosis: Unsteadiness on feet (R26.81);Muscle weakness (generalized) (M62.81);Pain;Ataxia, unspecified (R27.0);History of falling (Z91.81);Other abnormalities of gait and mobility (R26.89)   Activity Tolerance Patient tolerated  treatment well   Patient Left in chair;with call bell/phone within reach;with bed alarm set;Other (comment) (pt educated to call for help if needing back to bed but encouraged to sit for 30 minutes)   Nurse Communication Mobility status;Precautions        Time: 1660-6301 OT Time Calculation (min): 32 min  Charges: OT General Charges $OT Visit: 1 Visit OT Treatments $Self Care/Home Management : 8-22 mins   Brynn, OTR/L  Acute Rehabilitation Services Pager: [phone] Office: [phone] .    Jeri Modena 08/13/2020, 4:55 PM

## 2020-08-13 NOTE — Progress Notes (Signed)
Subjective: Patient reports Overall patient doing better improved function and motor strength upper and lower extremity still significantly numb does feel like she has a bit of a C4 sensory level  Objective: Vital signs in last 24 hours: Temp:  [97.8 F (36.6 C)-98.7 F (37.1 C)] 98.4 F (36.9 C) (01/18 0723) Pulse Rate:  [66-111] 92 (01/18 0723) Resp:  [14-18] 17 (01/18 0723) BP: (108-130)/(78-92) 116/92 (01/18 0723) SpO2:  [91 %-95 %] 92 % (01/18 0723)  Intake/Output from previous day: 01/17 0701 - 01/18 0700 In: 420 [P.O.:420] Out: 1525 [Urine:1525] Intake/Output this shift: No intake/output data recorded.  Lower extremity strength 4+ out of 5 upper extremity strength improved to 3 to 4- out of 5 incision clean dry and intact  Lab Results: No results for input(s): WBC, HGB, HCT, PLT in the last 72 hours. BMET No results for input(s): NA, K, CL, CO2, GLUCOSE, BUN, CREATININE, CALCIUM in the last 72 hours.  Studies/Results: No results found.  Assessment/Plan: Doing well postop cervical decompression will get rid of the hard collar changing over to a soft collar patient is okay for transfer to rehab when bed becomes available.  LOS: 5 days     Elaina Hoops 08/13/2020, 7:54 AM

## 2020-08-13 NOTE — Progress Notes (Signed)
Physical Therapy Treatment Patient Details Name: Margaret Vaughn MRN: [id number] DOB: 1961/04/02 Today's Date: 08/13/2020    History of Present Illness 60 yo female s/p PCD C1-3 PMH COPD 2L-5L O2 dependent HLD DM2 tobacco abuse memory loss DDD,    PT Comments    Pt steadily improving toward goals.  Emphasis on activities to maintain time up on her feet.  Transitions to EOB, sit to stand, safe use of the RW and ending in the chair to work on sitting tolerance.    Follow Up Recommendations  CIR     Equipment Recommendations  None recommended by PT    Recommendations for Other Services       Precautions / Restrictions Precautions Precautions: Fall;Cervical Required Braces or Orthoses: Cervical Brace Cervical Brace: Soft collar;Other (comment) (per dr Saintclair Halsted 1/18)    Mobility  Bed Mobility Overal bed mobility: Needs Assistance Bed Mobility: Rolling;Sidelying to Sit Rolling: Min assist Sidelying to sit: Mod assist       General bed mobility comments: truncal assist side to sitting.  min assist to scoot to EOB with extra time.  Transfers Overall transfer level: Needs assistance Equipment used: Rolling walker (2 wheeled) Transfers: Sit to/from Stand Sit to Stand: Min assist Stand pivot transfers: Min assist;Mod assist       General transfer comment: decrease grasp on the RW or uncoordinated use of hand during the transfer in general.  Ambulation/Gait Ambulation/Gait assistance: Min assist;Mod assist (to mod as she fatigues) Gait Distance (Feet): 70 Feet Assistive device: Rolling walker (2 wheeled) Gait Pattern/deviations: Step-through pattern;Decreased step length - right;Decreased stance time - right;Decreased stride length;Decreased step length - left Gait velocity: slower Gait velocity interpretation: <1.31 ft/sec, indicative of household ambulator General Gait Details: pt overall improved, but continues to be unsteady with gait overall, R LE more uncoordinated  than L and degrades quickly with fatigue.  Pt needs stability assist with fatigue plus help maneuvering the RW   Stairs             Wheelchair Mobility    Modified Rankin (Stroke Patients Only)       Balance   Sitting-balance support: Bilateral upper extremity supported;Feet supported;No upper extremity supported Sitting balance-Leahy Scale: Fair Sitting balance - Comments: mild posterior bias which she can manage.     Standing balance-Leahy Scale: Poor Standing balance comment: reliant on AD and therapist                            Cognition Arousal/Alertness: Awake/alert Behavior During Therapy: WFL for tasks assessed/performed Overall Cognitive Status: Within Functional Limits for tasks assessed                                        Exercises      General Comments General comments (skin integrity, edema, etc.): pt appeared to manage longer with less pain without the hard miami J collar on.      Pertinent Vitals/Pain Pain Assessment: Faces Faces Pain Scale: Hurts even more Pain Location: general, neck shoulders Pain Descriptors / Indicators: Discomfort Pain Intervention(s): Monitored during session    Home Living                      Prior Function            PT Goals (current goals can now be found  in the care plan section) Acute Rehab PT Goals PT Goal Formulation: With patient Time For Goal Achievement: 08/23/20 Potential to Achieve Goals: Good Progress towards PT goals: Progressing toward goals    Frequency    Min 5X/week      PT Plan Current plan remains appropriate    Co-evaluation PT/OT/SLP Co-Evaluation/Treatment: Yes Reason for Co-Treatment: For patient/therapist safety;To address functional/ADL transfers PT goals addressed during session: Mobility/safety with mobility OT goals addressed during session: ADL's and self-care;Strengthening/ROM      AM-PAC PT "6 Clicks" Mobility   Outcome  Measure  Help needed turning from your back to your side while in a flat bed without using bedrails?: A Lot Help needed moving from lying on your back to sitting on the side of a flat bed without using bedrails?: A Lot Help needed moving to and from a bed to a chair (including a wheelchair)?: A Little Help needed standing up from a chair using your arms (e.g., wheelchair or bedside chair)?: A Little Help needed to walk in hospital room?: A Lot Help needed climbing 3-5 steps with a railing? : A Lot 6 Click Score: 14    End of Session   Activity Tolerance: Patient tolerated treatment well;Patient limited by fatigue;Patient limited by pain Patient left: in chair;with call bell/phone within reach;with family/visitor present Nurse Communication: Mobility status PT Visit Diagnosis: Other abnormalities of gait and mobility (R26.89);Pain;Muscle weakness (generalized) (M62.81);Other symptoms and signs involving the nervous system (R29.898) Pain - part of body:  (shoulders and neck)     Time: 7654-6503 PT Time Calculation (min) (ACUTE ONLY): 37 min  Charges:  $Gait Training: 8-22 mins                     08/13/2020  Ginger Carne., PT Acute Rehabilitation Services [phone]  (pager) [phone]  (office)   Tessie Fass Jovoni Borkenhagen 08/13/2020, 1:26 PM
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
## 2020-08-13 NOTE — Progress Notes (Signed)
Pt arrived to unit via bed, husband and son at bedside, pt is alert, able to make needs known, oriented to rehab

## 2020-08-13 NOTE — Progress Notes (Addendum)
Inpatient Rehabilitation Medication Review by a Pharmacist  A complete drug regimen review was completed for this patient to identify any potential clinically significant medication issues.  Clinically significant medication issues were identified:  Yes  Type of Medication Issue Identified Description of Issue Urgent (address now) Non-Urgent (address on AM team rounds) Plan Plan Accepted by Provider? (Yes / No / Pending AM Rounds)  Drug Interaction(s) (clinically significant)       Duplicate Therapy       Allergy       No Medication Administration End Date       Incorrect Dose       Additional Drug Therapy Needed       Other  The following meds were listed on pt's discharge med [facility] from [facility] (these are home meds that pt did not receive at [facility]): Emgality injection every 30 days Fish oil Non urgent Pending review by CIR team on AM rounds Pending AM rounds    Name of provider notified for urgent issues identified:  N/A  For non-urgent medication issues to be resolved on team rounds tomorrow morning a [facility] Secure Chat Handoff was sent to:  N/A (med rec review completed after 5 PM)  Time spent performing this drug regimen review (minutes):  [place], PharmD, BCPS, [facility] Clinical Pharmacist 08/13/2020 7:19 PM

## 2020-08-14 ENCOUNTER — Inpatient Hospital Stay (HOSPITAL_COMMUNITY): Payer: Medicaid Other

## 2020-08-14 ENCOUNTER — Encounter (HOSPITAL_COMMUNITY): Payer: Self-pay | Admitting: Physical Medicine and Rehabilitation

## 2020-08-14 ENCOUNTER — Other Ambulatory Visit: Payer: Self-pay

## 2020-08-14 ENCOUNTER — Inpatient Hospital Stay (HOSPITAL_COMMUNITY): Payer: Medicaid Other | Admitting: Physical Therapy

## 2020-08-14 ENCOUNTER — Inpatient Hospital Stay (HOSPITAL_COMMUNITY): Payer: Medicaid Other | Admitting: Occupational Therapy

## 2020-08-14 LAB — CBC WITH DIFFERENTIAL/PLATELET
Abs Immature Granulocytes: 0.12 10*3/uL — ABNORMAL HIGH (ref 0.00–0.07)
Basophils Absolute: 0.1 10*3/uL (ref 0.0–0.1)
Basophils Relative: 0 %
Eosinophils Absolute: 0.1 10*3/uL (ref 0.0–0.5)
Eosinophils Relative: 1 %
HCT: 46.5 % — ABNORMAL HIGH (ref 36.0–46.0)
Hemoglobin: 16 g/dL — ABNORMAL HIGH (ref 12.0–15.0)
Immature Granulocytes: 1 %
Lymphocytes Relative: 14 %
Lymphs Abs: 2 10*3/uL (ref 0.7–4.0)
MCH: 30.1 pg (ref 26.0–34.0)
MCHC: 34.4 g/dL (ref 30.0–36.0)
MCV: 87.6 fL (ref 80.0–100.0)
Monocytes Absolute: 1.1 10*3/uL — ABNORMAL HIGH (ref 0.1–1.0)
Monocytes Relative: 8 %
Neutro Abs: 10.5 10*3/uL — ABNORMAL HIGH (ref 1.7–7.7)
Neutrophils Relative %: 76 %
Platelets: 367 10*3/uL (ref 150–400)
RBC: 5.31 MIL/uL — ABNORMAL HIGH (ref 3.87–5.11)
RDW: 14.6 % (ref 11.5–15.5)
WBC: 13.8 10*3/uL — ABNORMAL HIGH (ref 4.0–10.5)
nRBC: 0 % (ref 0.0–0.2)

## 2020-08-14 LAB — COMPREHENSIVE METABOLIC PANEL
ALT: 26 U/L (ref 0–44)
AST: 14 U/L — ABNORMAL LOW (ref 15–41)
Albumin: 2.8 g/dL — ABNORMAL LOW (ref 3.5–5.0)
Alkaline Phosphatase: 73 U/L (ref 38–126)
Anion gap: 10 (ref 5–15)
BUN: 17 mg/dL (ref 6–20)
CO2: 29 mmol/L (ref 22–32)
Calcium: 9.5 mg/dL (ref 8.9–10.3)
Chloride: 98 mmol/L (ref 98–111)
Creatinine, Ser: 0.56 mg/dL (ref 0.44–1.00)
GFR, Estimated: >60 mL/min (ref >60)
Glucose, Bld: 116 mg/dL — ABNORMAL HIGH (ref 70–99)
Potassium: 4.3 mmol/L (ref 3.5–5.1)
Sodium: 137 mmol/L (ref 135–145)
Total Bilirubin: 0.5 mg/dL (ref 0.3–1.2)
Total Protein: 6 g/dL — ABNORMAL LOW (ref 6.5–8.1)

## 2020-08-14 LAB — GLUCOSE, CAPILLARY
Glucose-Capillary: 128 mg/dL — ABNORMAL HIGH (ref 70–99)
Glucose-Capillary: 105 mg/dL — ABNORMAL HIGH (ref 70–99)

## 2020-08-14 MED ORDER — LIDOCAINE 5 % EX PTCH: 2.0000 | MEDICATED_PATCH | CUTANEOUS | Status: DC

## 2020-08-14 MED ORDER — MAGNESIUM CITRATE PO SOLN: 1.0000 | Freq: Once | ORAL | Status: DC
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
## 2020-08-14 NOTE — Progress Notes (Signed)
Took vape smoker from patient room, patient stated she was unaware that she was able to have it. Also stated that she was told she could have it because she agreed to rehab. Explained to patient about no tolerance for smoking on rehab unit. Also informed patient on nicotine patch being used. Nicotine patch placed to right upper arm. No further complications noted at this time.  Audie Clear, LPN

## 2020-08-14 NOTE — Progress Notes (Signed)
Physical Therapy Note  Patient Details  Name: Margaret Vaughn MRN: [id number] Date of Birth: 05/19/1961 Today's Date: 08/14/2020    Attempted to see patient for scheduled PM therapy session. Informed by nursing that pt had left hospital AMA.    [name], PT, DPT  08/14/2020, 4:56 PM

## 2020-08-14 NOTE — Progress Notes (Signed)
Inpatient Rehabilitation Care Coordinator Assessment and Plan Patient Details  Name: Margaret Vaughn MRN: [id number] Date of Birth: November 08, 1960  Today's Date: 08/14/2020  Hospital Problems: Principal Problem:   Quadriplegia, C1-C4 incomplete ([place]) Active Problems:   Cervical myelopathy ([place])  Past Medical History:  Past Medical History:  Diagnosis Date  . Acid reflux   . Allergies   . Asthma   . Bronchitis   . Cervical stenosis of spine   . Chronic pain syndrome   . COPD (chronic obstructive pulmonary disease) ([place])   . DDD (degenerative disc disease), lumbar   . Depression with anxiety   . Diabetes mellitus without complication ([place])   . Fatigue   . H/O blood clots   . Headache(784.0)   . Hemorrhoids, external   . HTN (hypertension)   . Hyperlipidemia   . Hypoxemia   . Insomnia   . Memory loss   . Myalgia and myositis   . Narcolepsy   . OSA (obstructive sleep apnea)   . Sleeping difficulty   . 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[place])   . Thoracalgia   . Tobacco abuse   . Tremor   . Ulcer disease    Past Surgical History:  Past Surgical History:  Procedure Laterality Date  . ABDOMINAL HYSTERECTOMY    . CHOLECYSTECTOMY    . OOPHORECTOMY    . POSTERIOR CERVICAL FUSION/FORAMINOTOMY N/A 08/07/2020   Procedure: POSTERIOR CERVICAL LAMINECTOMY FOR MYELOPATHY;  Surgeon: Kary Kos, MD;  Location: [facility];  Service: Neurosurgery;  Laterality: N/A;  . tonsillectomy     Social History:  reports that she has been smoking cigarettes. She has a 60.00 pack-year smoking history. She has never used smokeless tobacco. She reports that she does not drink alcohol and does not use drugs.  Family / Support Systems Marital Status: Married Patient Roles: Spouse Spouse/Significant Other: Margulies Children: son (autistic) Anticipated Caregiver: [name] (spouse) Ability/Limitations of Caregiver: Works during the day, but can take some time off  Social History Preferred language: English Religion:  None Read: Yes Write: Yes Guardian/Conservator: spouse   Abuse/Neglect Abuse/Neglect Assessment Can Be Completed: Yes Physical Abuse: Denies Verbal Abuse: Denies Sexual Abuse: Denies Exploitation of patient/patient's resources: Denies Self-Neglect: Denies  Emotional Status Substance Abuse History: Hx of Tobacco (3 packs in 24 hours)  Patient / Family Perceptions, Expectations & Goals Premorbid pt/family roles/activities: Active and independent in the [place]: None Premorbid Home Care/DME Agencies: Other (Comment) (Rollater, Bedside Commode, Shower seat, hand held shower head) Transportation available at discharge: Family able to transport  Discharge Planning Living Arrangements: Spouse/significant other,Children Support Systems: Spouse/significant other,Children Type of Residence: Private residence (1 level home, 3 steps to enter) Administrator, sports: Multimedia programmer (specify) ([employer]) Financial Screen Referred: No Living Expenses: Lives with family Money Management: Patient,Spouse Does the patient have any problems obtaining your medications?: No Home Management: Previously independent Patient/Family Preliminary Plans: Some assistance Care Coordinator Barriers to Discharge: Decreased caregiver support,Home environment access/layout,Insurance for SNF coverage Care Coordinator Barriers to Discharge Comments: SNF/HH not guarenteed due to insurance Care Coordinator Anticipated Follow Up Needs: HH/OP Expected length of stay: 7-10 Days  Clinical Impression SW met with patient and family in room. Introduced self, explained role and addressed questions and concerns. Patient stated she would be leaving today, SW asked for clarification on what she was experiencing. Patient upset due to being a chronic smoker (smoking about 3 packs in 24 hours) and vape was taken from pt room. Patient reports not being educated before  it was  taken, patient items were taken without notice from what patient reports. Patient states she did not know vapes were not allowed, due to having and using her hospital stay. Patient states she would have given vape to nurse, but it was taken behind her back. Patient would like to d/c AMA if unable to smoke outside. SW will update physician.    Margaret Vaughn 08/14/2020, 1:31 PM

## 2020-08-14 NOTE — Progress Notes (Signed)
Inpatient [facility] Individual Statement of Services  Patient Name:  Margaret Vaughn  Date:  08/14/2020  Welcome to the [facility].  Our goal is to provide you with an individualized program based on your diagnosis and situation, designed to meet your specific needs.  With this comprehensive rehabilitation program, you will be expected to participate in at least 3 hours of rehabilitation therapies Monday-Friday, with modified therapy programming on the weekends.  Your rehabilitation program will include the following services:  Physical Therapy (PT), Occupational Therapy (OT), Speech Therapy (ST), 24 hour per day rehabilitation nursing, Therapeutic Recreaction (TR), Neuropsychology, Care Coordinator, Rehabilitation Medicine, Nutrition Services, Pharmacy Services and Other  Weekly team conferences will be held on Tuesday to discuss your progress.  Your Inpatient Rehabilitation Care Coordinator will talk with you frequently to get your input and to update you on team discussions.  Team conferences with you and your family in attendance may also be held.  Expected length of stay: 7-10 Days  Overall anticipated outcome: MOD I  Depending on your progress and recovery, your program may change. Your Inpatient Rehabilitation Care Coordinator will coordinate services and will keep you informed of any changes. Your Inpatient Rehabilitation Care Coordinator's name and contact numbers are listed  below.  The following services may also be recommended but are not provided by the [facility]:    [place] will be made to provide these services after discharge if needed.  Arrangements include referral to agencies that provide these services.  Your insurance has been verified to be:  Medicaid [place] Your primary doctor is:  Building control surveyor, Vonna Kotyk, MD  Pertinent information  will be shared with your doctor and your insurance company.  Inpatient Rehabilitation Care Coordinator:  Adaya Garmany, [place] or [phone]  Information discussed with and copy given to patient by: Dyanne Iha, 08/14/2020, 9:10 AM

## 2020-08-14 NOTE — Progress Notes (Signed)
Inpatient Rehabilitation Care Coordinator Discharge Note  The overall goal for the admission was met for:   Discharge location: No  Length of Stay: Yes, ama  Discharge activity level: No, DID NOT MEET GOALS   Home/community participation: No  Services provided included: MD, PT, OT, SLP and RN  Financial Services: Choices offered to/list presented to: NONE  Follow-up services arranged: Patient/Family has no preference for HH/DME agencies  Comments (or additional information): PATIENT D/C AMA   Patient/Family verbalized understanding of follow-up arrangements: No  Individual responsible for coordination of the follow-up plan: patient   Confirmed correct DME delivered: BHAVYA ESCHETE 08/14/2020    Dyanne Iha

## 2020-08-14 NOTE — Progress Notes (Addendum)
Notified Dr. Dagoberto Ligas of patient's request to leave due to not being able to vape. Dr. Dagoberto Ligas stated that patient can leave AMA if not agreement with policy of not vaping on premises. Dr. Dagoberto Ligas stated to educate patient and husband of AMA consequences of not having prescriptions or insurance potentially not paying for care. Pt and husband notified via nurse caring for patient.
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
## 2020-08-14 NOTE — Progress Notes (Signed)
Patient's husband called and said she had asked to be picked up and brought home. Explained to patient what services would be given up if she left AMA. Discussed with PA and then AD who notified MD. When husband arrived, explained again AMA policy. Patient decided to leave AMA and refused to sign AMA documentation. Copy placed in patient's file. Vape device returned to patient's husband.

## 2020-08-14 NOTE — Progress Notes (Signed)
Inpatient Rehabilitation  Patient information reviewed and entered into eRehab system by Captain Blucher M. Altin Sease, M.A., CCC/SLP, PPS Coordinator.  Information including medical coding, functional ability and quality indicators will be reviewed and updated through discharge.    
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
## 2020-08-14 NOTE — Progress Notes (Signed)
Patient left AMA, refused to sign paperwork. Witnessed by charge nurse. Patient and family given all information on protocol for leaving facility AMA. No further complications noted at this time.  Audie Clear, LPN

## 2020-08-15 ENCOUNTER — Telehealth: Payer: Self-pay | Admitting: Neurology

## 2020-08-15 ENCOUNTER — Telehealth: Payer: Self-pay

## 2020-08-15 ENCOUNTER — Other Ambulatory Visit: Payer: Self-pay | Admitting: Neurology
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
## 2020-08-15 NOTE — Telephone Encounter (Signed)
Patient refused to stop smoking while inpatient despite the hospital campus being a smoke-free zone, she refused to give up the smoking and left the hospital inpatient physical therapy unit against medical advice. If patient calls for follow up please discuss with Dr. Jaynee Eagles prior to scheduling as, she likely needs a spinal cord or Physical medicine and Rehab physician(Dr. Read Drivers possibly) to follow her for her cervical cord injury. Thanks
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
## 2020-08-16 NOTE — Telephone Encounter (Signed)
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  Those are both diagnoses that she carries and we did with referral.

## 2020-08-21 ENCOUNTER — Telehealth: Payer: Self-pay | Admitting: Family Medicine

## 2020-08-27 DIAGNOSIS — R0902 Hypoxemia: Secondary | ICD-10-CM | POA: Diagnosis not present

## 2020-08-30 ENCOUNTER — Other Ambulatory Visit: Payer: Self-pay

## 2020-08-30 ENCOUNTER — Ambulatory Visit (INDEPENDENT_AMBULATORY_CARE_PROVIDER_SITE_OTHER): Payer: Medicaid Other | Admitting: Family Medicine

## 2020-08-30 ENCOUNTER — Encounter: Payer: Self-pay | Admitting: Family Medicine

## 2020-08-30 VITALS — BP 117/81 | HR 101 | Ht 64.0 in | Wt 154.0 lb

## 2020-08-30 DIAGNOSIS — M4802 Spinal stenosis, cervical region: Secondary | ICD-10-CM | POA: Diagnosis not present

## 2020-08-30 DIAGNOSIS — G950 Syringomyelia and syringobulbia: Secondary | ICD-10-CM | POA: Diagnosis not present

## 2020-08-30 MED ORDER — DULOXETINE HCL 30 MG PO CPEP
30.0000 mg | ORAL_CAPSULE | Freq: Every day | ORAL | 2 refills | Status: DC
Start: 2020-08-30 — End: 2020-12-03

## 2020-08-30 MED ORDER — OXYCODONE HCL ER 20 MG PO T12A: 20.0000 mg | EXTENDED_RELEASE_TABLET | Freq: Two times a day (BID) | ORAL | 0 refills | Status: DC

## 2020-08-30 MED ORDER — BACLOFEN 10 MG PO TABS: 10.0000 mg | ORAL_TABLET | Freq: Three times a day (TID) | ORAL | 3 refills | Status: DC

## 2020-08-30 NOTE — Progress Notes (Signed)
BP 117/81   Pulse (!) 101   Ht 5\' 4"  (1.626 m)   Wt 154 lb (69.9 kg)   SpO2 94%   BMI 26.43 kg/m    Subjective:   Patient ID: Margaret Vaughn, female    DOB: 1960-08-09, 60 y.o.   MRN: [id number]  HPI: Margaret Vaughn is a 60 y.o. female presenting on 08/30/2020 for cervical stenosis   HPI Patient has chronic neck and back pain.  She had been on pain management for and was taking higher doses.  She is tapered down to oxycodone extended release.  She has been taking this over the past few months.  She is trying to get into a new pain management doctor.  She also uses Lyrica and Cymbalta is something we talked about adding new.  Relevant past medical, surgical, family and social history reviewed and updated as indicated. Interim medical history since our last visit reviewed. Allergies and medications reviewed and updated.  Review of Systems  Constitutional: Negative for chills and fever.  Eyes: Negative for visual disturbance.  Respiratory: Negative for chest tightness and shortness of breath.   Cardiovascular: Negative for chest pain and leg swelling.  Musculoskeletal: Positive for arthralgias, back pain, myalgias and neck pain. Negative for gait problem.  Skin: Negative for rash.  Neurological: Negative for dizziness, weakness, light-headedness, numbness and headaches.  Psychiatric/Behavioral: Negative for agitation and behavioral problems.  All other systems reviewed and are negative.   Per HPI unless specifically indicated above   Allergies as of 08/30/2020      Reactions   Iohexol Anaphylaxis   PT.GIVEN OMNI 300 FOR CT ANGIO WAS GIVEN 50 MG.BENADRYL PRIOR TO SCAN HAD NO PROBLEMS   Moxifloxacin Anaphylaxis   Shellfish Allergy Hives      Medication List       Accurate as of August 30, 2020  2:27 PM. If you have any questions, ask your nurse or doctor.        albuterol 108 (90 Base) MCG/ACT inhaler Commonly known as: VENTOLIN HFA TAKE 2 PUFFS BY MOUTH EVERY 6  HOURS AS NEEDED FOR WHEEZE OR SHORTNESS OF BREATH What changed: See the new instructions.   atorvastatin 40 MG tablet Commonly known as: LIPITOR Take 1 tablet (40 mg total) by mouth daily.   cyclobenzaprine 10 MG tablet Commonly known as: FLEXERIL TAKE 1 TABLET BY MOUTH THREE TIMES A DAY AS NEEDED FOR MUSCLE SPASMS What changed:   how much to take  how to take this  when to take this  reasons to take this  additional instructions   Emgality 120 MG/ML Soaj Generic drug: Galcanezumab-gnlm Inject 120 mg into the skin every 30 (thirty) days.   enalapril 5 MG tablet Commonly known as: VASOTEC Take 1 tablet (5 mg total) by mouth daily.   Fish Oil 1000 MG Caps Take 2 capsules by mouth at bedtime.   Fluticasone-Salmeterol 100-50 MCG/DOSE Aepb Commonly known as: Advair Diskus Inhale 1 puff into the lungs 2 (two) times daily.   HAIR SKIN NAILS PO Take 3 tablets by mouth daily.   multivitamin with minerals tablet Take 1 tablet by mouth daily.   Janumet 50-1000 MG tablet Generic drug: sitaGLIPtin-metformin Take 1 tablet by mouth 2 (two) times daily with a meal.   Melatonin 300 MCG Tabs Take 1 tablet by mouth at bedtime.   oxyCODONE 20 mg 12 hr tablet Commonly known as: OxyCONTIN Take 1 tablet (20 mg total) by mouth every 12 (twelve) hours.  oxyCODONE 20 mg 12 hr tablet Commonly known as: OxyCONTIN Take 1 tablet (20 mg total) by mouth every 12 (twelve) hours.   oxyCODONE-acetaminophen 10-325 MG tablet Commonly known as: PERCOCET Take 1 tablet by mouth 4 (four) times daily as needed for pain.   pregabalin 150 MG capsule Commonly known as: LYRICA Take 1 capsule (150 mg total) by mouth 3 (three) times daily.   Probiotic-10 Caps Take 1 tablet by mouth daily.   rizatriptan 10 MG disintegrating tablet Commonly known as: MAXALT-MLT Take 1 tablet (10 mg total) by mouth as needed for migraine. May repeat in 2 hours if needed   Spiriva HandiHaler 18 MCG inhalation  capsule Generic drug: tiotropium INHALE CONTENTS OF 1 CAPSULE VIA HANDIHALER ONCE DAILY AT THE SAME TIME EVERY DAY   VITAMIN D (ERGOCALCIFEROL) PO Take 1 capsule by mouth daily.   Xtampza ER 13.5 MG C12a Generic drug: oxyCODONE ER Take 1 capsule by mouth 2 (two) times daily.        Objective:   BP 117/81   Pulse (!) 101   Ht 5\' 4"  (1.626 m)   Wt 154 lb (69.9 kg)   SpO2 94%   BMI 26.43 kg/m   Wt Readings from Last 3 Encounters:  08/30/20 154 lb (69.9 kg)  08/13/20 147 lb 11.3 oz (67 kg)  07/18/20 150 lb (68 kg)    Physical Exam Vitals and nursing note reviewed.  Constitutional:      General: She is not in acute distress.    Appearance: She is well-developed and well-nourished. She is not diaphoretic.  Eyes:     Extraocular Movements: EOM normal.     Conjunctiva/sclera: Conjunctivae normal.  Cardiovascular:     Rate and Rhythm: Normal rate and regular rhythm.     Pulses: Intact distal pulses.     Heart sounds: Normal heart sounds. No murmur heard.   Pulmonary:     Effort: Pulmonary effort is normal. No respiratory distress.     Breath sounds: Normal breath sounds. No wheezing.  Musculoskeletal:        General: No edema.  Skin:    General: Skin is warm and dry.     Findings: No rash.  Neurological:     Mental Status: She is alert and oriented to person, place, and time.     Coordination: Coordination normal.  Psychiatric:        Mood and Affect: Mood and affect normal.       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  She says she has an appointment with them next Friday. Follow up plan: Return in about 3 months (around 11/27/2020), or if symptoms worsen or fail to improve, for Follow-up  anxiety and neuropathy.  Counseling provided for all of the vaccine components No orders of the defined types were placed in this encounter.   Caryl Pina, MD [facility] 08/30/2020, 2:27 PM

## 2020-08-31 DIAGNOSIS — X088XXA Exposure to other specified smoke, fire and flames, initial encounter: Secondary | ICD-10-CM | POA: Diagnosis not present

## 2020-08-31 DIAGNOSIS — T2020XA Burn of second degree of head, face, and neck, unspecified site, initial encounter: Secondary | ICD-10-CM | POA: Diagnosis not present

## 2020-09-02 ENCOUNTER — Telehealth: Payer: Self-pay | Admitting: *Deleted

## 2020-09-02 NOTE — Telephone Encounter (Signed)
PA started for patient on Oxycontin 20mg  ER. I will complete once providers not is finished.

## 2020-09-02 NOTE — Telephone Encounter (Signed)
PA in process   Key: [facility] - PA Case ID: [id number] - Rx #: [id number] Need help? Call us at [phone] Status Sent to [place] 20MG  er tablets Form [facility] Electronic [place] Form [phone] NCPDP)

## 2020-09-03 NOTE — Telephone Encounter (Signed)
Denied  - plan will not cover

## 2020-09-05 NOTE — Telephone Encounter (Signed)
Can we call patient and let her know that her options are to self-pay and just purchased this last month prescription at this until she can get the pain management or we would have to discuss different options but I cannot assure that the other ones are going to be covered.  Let me know what she decides, she may have already picked it up and paid cash for it

## 2020-09-05 NOTE — Telephone Encounter (Signed)
Called patient no answer vm full

## 2020-09-06 DIAGNOSIS — M4802 Spinal stenosis, cervical region: Secondary | ICD-10-CM | POA: Diagnosis not present

## 2020-09-06 DIAGNOSIS — Z79899 Other long term (current) drug therapy: Secondary | ICD-10-CM | POA: Diagnosis not present

## 2020-09-06 DIAGNOSIS — G894 Chronic pain syndrome: Secondary | ICD-10-CM | POA: Diagnosis not present

## 2020-09-06 DIAGNOSIS — M62838 Other muscle spasm: Secondary | ICD-10-CM | POA: Diagnosis not present

## 2020-09-11 MED ORDER — OXYCODONE HCL ER 20 MG PO T12A
20.0000 mg | EXTENDED_RELEASE_TABLET | Freq: Two times a day (BID) | ORAL | 0 refills | Status: DC
Start: 2020-09-11 — End: 2020-12-12

## 2020-09-11 NOTE — Telephone Encounter (Signed)
Spoke to patient she states pharmacy informed her that it needs to be written for generic only no name brand and that will bring cost down for patient.  Please send in new RX for generic only

## 2020-09-11 NOTE — Telephone Encounter (Signed)
I sent 1 month as generic only no name brand, let me know if that works.

## 2020-09-24 DIAGNOSIS — R0902 Hypoxemia: Secondary | ICD-10-CM | POA: Diagnosis not present

## 2020-10-08 ENCOUNTER — Telehealth: Payer: Self-pay | Admitting: Family Medicine

## 2020-10-08 NOTE — Telephone Encounter (Signed)
Attempted to reach Margaret Vaughn today to get her scheduled for a phone visit with the Managed Medicaid Team. I was not able to leave a message due to her VM being full. I will reach out again in the next few days.

## 2020-10-21 ENCOUNTER — Telehealth: Payer: Self-pay | Admitting: Family Medicine

## 2020-10-21 NOTE — Telephone Encounter (Signed)
2nd attempt to reach [name] to get her scheduled for a phone visit with the Managed Medicaid team. Unable to leave a message.

## 2020-10-22 ENCOUNTER — Other Ambulatory Visit: Payer: Self-pay | Admitting: Family Medicine

## 2020-10-25 DIAGNOSIS — R0902 Hypoxemia: Secondary | ICD-10-CM | POA: Diagnosis not present

## 2020-11-01 DIAGNOSIS — Z79899 Other long term (current) drug therapy: Secondary | ICD-10-CM | POA: Diagnosis not present

## 2020-11-09 DIAGNOSIS — Z87891 Personal history of nicotine dependence: Secondary | ICD-10-CM | POA: Diagnosis not present

## 2020-11-12 ENCOUNTER — Ambulatory Visit: Payer: Medicaid Other | Attending: Student | Admitting: Physical Therapy

## 2020-11-12 ENCOUNTER — Encounter: Payer: Self-pay | Admitting: Physical Therapy

## 2020-11-12 ENCOUNTER — Other Ambulatory Visit: Payer: Self-pay

## 2020-11-12 DIAGNOSIS — R2681 Unsteadiness on feet: Secondary | ICD-10-CM | POA: Diagnosis not present

## 2020-11-12 DIAGNOSIS — M542 Cervicalgia: Secondary | ICD-10-CM | POA: Diagnosis not present

## 2020-11-12 DIAGNOSIS — R293 Abnormal posture: Secondary | ICD-10-CM

## 2020-11-12 DIAGNOSIS — M6281 Muscle weakness (generalized): Secondary | ICD-10-CM | POA: Diagnosis not present
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
## 2020-11-13 DIAGNOSIS — J449 Chronic obstructive pulmonary disease, unspecified: Secondary | ICD-10-CM | POA: Diagnosis not present

## 2020-11-18 DIAGNOSIS — M549 Dorsalgia, unspecified: Secondary | ICD-10-CM | POA: Diagnosis not present

## 2020-11-18 DIAGNOSIS — Q064 Hydromyelia: Secondary | ICD-10-CM | POA: Diagnosis not present

## 2020-11-18 DIAGNOSIS — M4312 Spondylolisthesis, cervical region: Secondary | ICD-10-CM | POA: Diagnosis not present

## 2020-11-24 DIAGNOSIS — R0902 Hypoxemia: Secondary | ICD-10-CM | POA: Diagnosis not present

## 2020-11-29 ENCOUNTER — Ambulatory Visit: Payer: Medicaid Other | Admitting: Family Medicine

## 2020-12-03 ENCOUNTER — Other Ambulatory Visit: Payer: Self-pay | Admitting: Family Medicine

## 2020-12-03 DIAGNOSIS — J441 Chronic obstructive pulmonary disease with (acute) exacerbation: Secondary | ICD-10-CM

## 2020-12-04 ENCOUNTER — Other Ambulatory Visit: Payer: Self-pay

## 2020-12-04 ENCOUNTER — Ambulatory Visit: Payer: Medicaid Other | Attending: Student | Admitting: Physical Therapy

## 2020-12-04 ENCOUNTER — Encounter: Payer: Self-pay | Admitting: Physical Therapy

## 2020-12-04 DIAGNOSIS — R293 Abnormal posture: Secondary | ICD-10-CM | POA: Insufficient documentation

## 2020-12-04 DIAGNOSIS — R2681 Unsteadiness on feet: Secondary | ICD-10-CM

## 2020-12-04 DIAGNOSIS — M6281 Muscle weakness (generalized): Secondary | ICD-10-CM | POA: Diagnosis not present

## 2020-12-04 DIAGNOSIS — M542 Cervicalgia: Secondary | ICD-10-CM
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
## 2020-12-05 DIAGNOSIS — I10 Essential (primary) hypertension: Secondary | ICD-10-CM | POA: Diagnosis not present

## 2020-12-05 DIAGNOSIS — M542 Cervicalgia: Secondary | ICD-10-CM | POA: Diagnosis not present

## 2020-12-06 DIAGNOSIS — Z79899 Other long term (current) drug therapy: Secondary | ICD-10-CM | POA: Diagnosis not present

## 2020-12-09 ENCOUNTER — Ambulatory Visit (HOSPITAL_COMMUNITY)
Admission: RE | Admit: 2020-12-09 | Discharge: 2020-12-09 | Disposition: A | Discharge status: Home / Self Care | Payer: Medicaid Other | Source: Ambulatory Visit | Attending: Student | Admitting: Student

## 2020-12-09 ENCOUNTER — Other Ambulatory Visit: Payer: Self-pay | Admitting: Student

## 2020-12-09 ENCOUNTER — Other Ambulatory Visit: Payer: Self-pay

## 2020-12-09 ENCOUNTER — Other Ambulatory Visit (HOSPITAL_COMMUNITY): Payer: Self-pay | Admitting: Student

## 2020-12-09 DIAGNOSIS — M542 Cervicalgia: Secondary | ICD-10-CM

## 2020-12-10 ENCOUNTER — Encounter: Payer: Medicaid Other | Admitting: *Deleted

## 2020-12-10 ENCOUNTER — Other Ambulatory Visit: Payer: Self-pay | Admitting: Neurosurgery

## 2020-12-10 DIAGNOSIS — M542 Cervicalgia: Secondary | ICD-10-CM | POA: Diagnosis not present

## 2020-12-10 DIAGNOSIS — M4312 Spondylolisthesis, cervical region: Secondary | ICD-10-CM | POA: Diagnosis not present

## 2020-12-10 DIAGNOSIS — G959 Disease of spinal cord, unspecified: Secondary | ICD-10-CM | POA: Diagnosis not present

## 2020-12-10 DIAGNOSIS — R030 Elevated blood-pressure reading, without diagnosis of hypertension: Secondary | ICD-10-CM | POA: Diagnosis not present

## 2020-12-12 ENCOUNTER — Other Ambulatory Visit: Payer: Self-pay

## 2020-12-12 ENCOUNTER — Encounter: Payer: Medicaid Other | Admitting: Physical Therapy

## 2020-12-12 ENCOUNTER — Ambulatory Visit: Payer: Medicaid Other | Admitting: Family Medicine

## 2020-12-12 ENCOUNTER — Encounter: Payer: Self-pay | Admitting: Family Medicine

## 2020-12-12 VITALS — BP 119/87 | HR 99 | Ht 64.0 in | Wt 147.4 lb

## 2020-12-12 DIAGNOSIS — F411 Generalized anxiety disorder: Secondary | ICD-10-CM | POA: Diagnosis not present

## 2020-12-12 DIAGNOSIS — G629 Polyneuropathy, unspecified: Secondary | ICD-10-CM | POA: Diagnosis not present

## 2020-12-12 MED ORDER — DULOXETINE HCL 30 MG PO CPEP: 30.0000 mg | ORAL_CAPSULE | Freq: Every day | ORAL | 2 refills | Status: DC

## 2020-12-12 NOTE — Progress Notes (Signed)
BP 119/87   Pulse 99   Ht 5\' 4"  (1.626 m)   SpO2 93%   BMI 26.43 kg/m    Subjective:   Patient ID: Margaret Vaughn, female    DOB: May 01, 1961, 60 y.o.   MRN: [id number]  HPI: [name] is a 60 y.o. female presenting on 12/12/2020 for Medical Management of Chronic Issues and Anxiety   HPI Anxiety neuropathy Patient is having a lot more neuropathy because of back and neck problems.  She states they have some shifting at the C2 level and they have her neck brace and are concerned that she could end up paralyzed if not did if it shifts that is caused her to have a lot of anxiety.  They are planning emergent surgery.  She is currently taking oxycodone and Lyrica.  She says she has been very anxious with everything going on for especially how there was also confusion with her pain management doctors because of an insurance mixup but she got that figured out now.  Relevant past medical, surgical, family and social history reviewed and updated as indicated. Interim medical history since our last visit reviewed. Allergies and medications reviewed and updated.  Review of Systems  Constitutional: Negative for chills and fever.  Eyes: Negative for visual disturbance.  Respiratory: Negative for chest tightness and shortness of breath.   Cardiovascular: Negative for chest pain and leg swelling.  Musculoskeletal: Negative for back pain and gait problem.  Skin: Negative for rash.  Neurological: Negative for light-headedness and headaches.  Psychiatric/Behavioral: Negative for agitation and behavioral problems.  All other systems reviewed and are negative.   Per HPI unless specifically indicated above   Allergies as of 12/12/2020      Reactions   Iohexol Anaphylaxis   PT.GIVEN OMNI 300 FOR CT ANGIO WAS GIVEN 50 MG.BENADRYL PRIOR TO SCAN HAD NO PROBLEMS   Moxifloxacin Anaphylaxis   Shellfish Allergy Hives      Medication List       Accurate as of Dec 12, 2020  1:39 PM. If you  have any questions, ask your nurse or doctor.        albuterol 108 (90 Base) MCG/ACT inhaler Commonly known as: VENTOLIN HFA TAKE 2 PUFFS BY MOUTH EVERY 6 HOURS AS NEEDED FOR WHEEZE OR SHORTNESS OF BREATH What changed: See the new instructions.   atorvastatin 40 MG tablet Commonly known as: LIPITOR Take 1 tablet (40 mg total) by mouth daily.   baclofen 10 MG tablet Commonly known as: LIORESAL Take 1 tablet (10 mg total) by mouth 3 (three) times daily. What changed:   when to take this  reasons to take this   cholecalciferol 25 MCG (1000 UNIT) tablet Commonly known as: VITAMIN D3 Take 1,000 Units by mouth daily.   cyclobenzaprine 10 MG tablet Commonly known as: FLEXERIL Take 10 mg by mouth 3 (three) times daily as needed for muscle spasms.   enalapril 5 MG tablet Commonly known as: VASOTEC Take 1 tablet (5 mg total) by mouth daily.   Fish Oil 1000 MG Caps Take 4 capsules by mouth daily.   guaiFENesin 600 MG 12 hr tablet Commonly known as: MUCINEX Take 1,200 mg by mouth 2 (two) times daily.   HAIR SKIN NAILS PO Take 3 tablets by mouth daily.   multivitamin with minerals tablet Take 1 tablet by mouth daily.   Janumet 50-1000 MG tablet Generic drug: sitaGLIPtin-metformin Take 1 tablet by mouth 2 (two) times daily with a meal.  nicotine 21 mg/24hr patch Commonly known as: NICODERM CQ - dosed in mg/24 hours Place 21 mg onto the skin daily.   nitrofurantoin 100 MG capsule Commonly known as: MACRODANTIN Take 100 mg by mouth daily.   oxyCODONE 20 mg 12 hr tablet Commonly known as: OxyCONTIN Take 1 tablet (20 mg total) by mouth every 12 (twelve) hours.   oxyCODONE 20 mg 12 hr tablet Commonly known as: OxyCONTIN Take 1 tablet (20 mg total) by mouth every 12 (twelve) hours. Generic only no name brand   oxyCODONE-acetaminophen 10-325 MG tablet Commonly known as: PERCOCET Take 1 tablet by mouth 4 (four) times daily as needed for pain.   pregabalin 150 MG  capsule Commonly known as: LYRICA Take 1 capsule (150 mg total) by mouth 3 (three) times daily.   rizatriptan 10 MG disintegrating tablet Commonly known as: MAXALT-MLT Take 1 tablet (10 mg total) by mouth as needed for migraine. May repeat in 2 hours if needed   Spiriva HandiHaler 18 MCG inhalation capsule Generic drug: tiotropium INHALE CONTENTS OF 1 CAPSULE VIA HANDIHALER ONCE DAILY AT THE SAME TIME EVERY DAY What changed: See the new instructions.   Wixela Inhub 100-50 MCG/ACT Aepb Generic drug: fluticasone-salmeterol INHALE 1 PUFF INTO THE LUNGS TWICE A DAY What changed: See the new instructions.   Xtampza ER 13.5 MG C12a Generic drug: oxyCODONE ER Take 13.5 mg by mouth 2 (two) times daily.        Objective:   BP 119/87   Pulse 99   Ht 5\' 4"  (1.626 m)   SpO2 93%   BMI 26.43 kg/m   Wt Readings from Last 3 Encounters:  08/30/20 154 lb (69.9 kg)  08/13/20 147 lb 11.3 oz (67 kg)  07/18/20 150 lb (68 kg)    Physical Exam Vitals and nursing note reviewed.  Constitutional:      General: She is not in acute distress.    Appearance: She is well-developed. She is not diaphoretic.  Eyes:     Conjunctiva/sclera: Conjunctivae normal.  Cardiovascular:     Rate and Rhythm: Normal rate and regular rhythm.     Heart sounds: Normal heart sounds. No murmur heard.   Pulmonary:     Effort: Pulmonary effort is normal. No respiratory distress.     Breath sounds: Normal breath sounds. No wheezing.  Musculoskeletal:        General: No tenderness. Normal range of motion.  Skin:    General: Skin is warm and dry.     Findings: No rash.  Neurological:     Mental Status: She is alert and oriented to person, place, and time.     Coordination: Coordination normal.  Psychiatric:        Behavior: Behavior normal.       Assessment & Plan:   Problem List Items Addressed This Visit      Nervous and Auditory   Neuropathy - Primary   Relevant Medications   DULoxetine  (CYMBALTA) 30 MG capsule     Other   GAD (generalized anxiety disorder)   Relevant Medications   DULoxetine (CYMBALTA) 30 MG capsule      Patient never taking Cymbalta, will retry to get Cymbalta again. Follow up plan: Return if symptoms worsen or fail to improve, for 3 to 4-week follow-up for physical anxiety and depression.  Counseling provided for all of the vaccine components No orders of the defined types were placed in this encounter.   Caryl Pina, MD [facility] 12/12/2020,  1:39 PM

## 2020-12-16 NOTE — Progress Notes (Signed)
Surgical Instructions    Your procedure is scheduled on Wednesday, May 25th, 2022.  Report to [facility] Main Entrance "A" at 09:15 A.M., then check in with the Admitting office.  Call this number if you have problems the morning of surgery:  [phone]   If you have any questions prior to your surgery date call [phone]: Open Monday-Friday 8am-4pm    Remember:  Do not eat or drink after midnight the night before your surgery   Take these medicines the morning of surgery with A SIP OF WATER   atorvastatin (LIPITOR)  DULoxetine (CYMBALTA) guaiFENesin (MUCINEX)  nitrofurantoin (MACRODANTIN)  pregabalin (LYRICA) SPIRIVA HANDIHALER - please, bring the inhaler with you WIXELA INHUB  XTAMPZA ER   If needed:  albuterol (VENTOLIN HFA) - please, bring the inhaler with you cyclobenzaprine (FLEXERIL)  oxyCODONE-acetaminophen (PERCOCET)  rizatriptan (MAXALT-MLT)  baclofen (LIORESAL)   As of today, STOP taking any Aspirin (unless otherwise instructed by your surgeon) Aleve, Naproxen, Ibuprofen, Motrin, Advil, Goody's, BC's, all herbal medications, fish oil, and all vitamins.   WHAT DO I DO ABOUT MY DIABETES MEDICATION?   Marland Kitchen Do not take sitaGLIPtin-metformin (JANUMET)  the morning of surgery.   HOW TO MANAGE YOUR DIABETES BEFORE AND AFTER SURGERY  Why is it important to control my blood sugar before and after surgery? . Improving blood sugar levels before and after surgery helps healing and can limit problems. . A way of improving blood sugar control is eating a healthy diet by: o  Eating less sugar and carbohydrates o  Increasing activity/exercise o  Talking with your doctor about reaching your blood sugar goals . High blood sugars (greater than 180 mg/dL) can raise your risk of infections and slow your recovery, so you will need to focus on controlling your diabetes during the weeks before surgery. . Make sure that the doctor who takes care of your diabetes knows about  your planned surgery including the date and location.  How do I manage my blood sugar before surgery? . Check your blood sugar at least 4 times a day, starting 2 days before surgery, to make sure that the level is not too high or low.  . Check your blood sugar the morning of your surgery when you wake up and every 2 hours until you get to the Short Stay unit.  o If your blood sugar is less than 70 mg/dL, you will need to treat for low blood sugar: - Do not take insulin. - Treat a low blood sugar (less than 70 mg/dL) with  cup of clear juice (cranberry or apple), 4 glucose tablets, OR glucose gel. - Recheck blood sugar in 15 minutes after treatment (to make sure it is greater than 70 mg/dL). If your blood sugar is not greater than 70 mg/dL on recheck, call [phone] for further instructions. . Report your blood sugar to the short stay nurse when you get to Short Stay.  . If you are admitted to the hospital after surgery: o Your blood sugar will be checked by the staff and you will probably be given insulin after surgery (instead of oral diabetes medicines) to make sure you have good blood sugar levels. o The goal for blood sugar control after surgery is 80-180 mg/dL.                      Do not wear jewelry, make up, or nail polish            Do not  wear lotions, powders, perfumes, or deodorant.            Do not shave 48 hours prior to surgery.  .            Do not bring valuables to the hospital.            [facility] is not responsible for any belongings or valuables.  Do NOT Smoke (Tobacco/Vaping) or drink Alcohol 24 hours prior to your procedure If you use a CPAP at night, you may bring all equipment for your overnight stay.   Contacts, glasses, dentures or bridgework may not be worn into surgery, please bring cases for these belongings   For patients admitted to the hospital, discharge time will be determined by your treatment team.   Patients discharged the day of surgery  will not be allowed to drive home, and someone needs to stay with them for 24 hours.    Special instructions:    Oral Hygiene is also important to reduce your risk of infection.  Remember - BRUSH YOUR TEETH THE MORNING OF SURGERY WITH YOUR REGULAR TOOTHPASTE   [place]- Preparing For Surgery  Before surgery, you can play an important role. Because skin is not sterile, your skin needs to be as free of germs as possible. You can reduce the number of germs on your skin by washing with CHG (chlorahexidine gluconate) Soap before surgery.  CHG is an antiseptic cleaner which kills germs and bonds with the skin to continue killing germs even after washing.     Please do not use if you have an allergy to CHG or antibacterial soaps. If your skin becomes reddened/irritated stop using the CHG.  Do not shave (including legs and underarms) for at least 48 hours prior to first CHG shower. It is OK to shave your face.  Please follow these instructions carefully.    1.  Shower the NIGHT BEFORE SURGERY and the MORNING OF SURGERY with CHG Soap.   If you chose to wash your hair, wash your hair first as usual with your normal shampoo. After you shampoo, rinse your hair and body thoroughly to remove the shampoo.  Then [employer] and genitals (private parts) with your normal soap and rinse thoroughly to remove soap.  2. After that Use CHG Soap as you would any other liquid soap. You can apply CHG directly to the skin and wash gently with a scrungie or a clean washcloth.   3. Apply the CHG Soap to your body ONLY FROM THE NECK DOWN.  Do not use on open wounds or open sores. Avoid contact with your eyes, ears, mouth and genitals (private parts). Wash Face and genitals (private parts)  with your normal soap.   4. Wash thoroughly, paying special attention to the area where your surgery will be performed.  5. Thoroughly rinse your body with warm water from the neck down.  6. DO NOT shower/wash with your normal  soap after using and rinsing off the CHG Soap.  7. Pat yourself dry with a CLEAN TOWEL.  8. Wear CLEAN PAJAMAS to bed the night before surgery  9. Place CLEAN SHEETS on your bed the night before your surgery  10. DO NOT SLEEP WITH PETS.   Day of Surgery:  Take a shower with CHG soap. Wear Clean/Comfortable clothing the morning of surgery Do not apply any deodorants/lotions.   Remember to brush your teeth WITH YOUR REGULAR TOOTHPASTE.   Please read over the following fact sheets  that you were given.

## 2020-12-17 ENCOUNTER — Other Ambulatory Visit: Payer: Self-pay | Admitting: Ophthalmology

## 2020-12-17 ENCOUNTER — Encounter (HOSPITAL_COMMUNITY): Payer: Self-pay

## 2020-12-17 ENCOUNTER — Other Ambulatory Visit: Payer: Self-pay

## 2020-12-17 ENCOUNTER — Other Ambulatory Visit (HOSPITAL_COMMUNITY): Payer: Self-pay | Admitting: Ophthalmology

## 2020-12-17 ENCOUNTER — Encounter (HOSPITAL_COMMUNITY)
Admission: RE | Admit: 2020-12-17 | Discharge: 2020-12-17 | Disposition: A | Discharge status: Home / Self Care | Payer: Medicaid Other | Source: Ambulatory Visit | Attending: Neurosurgery | Admitting: Neurosurgery

## 2020-12-17 DIAGNOSIS — Z01812 Encounter for preprocedural laboratory examination: Secondary | ICD-10-CM | POA: Diagnosis not present

## 2020-12-17 DIAGNOSIS — M542 Cervicalgia: Secondary | ICD-10-CM

## 2020-12-17 DIAGNOSIS — Z20822 Contact with and (suspected) exposure to covid-19: Secondary | ICD-10-CM | POA: Insufficient documentation

## 2020-12-17 HISTORY — DX: Pneumonia, unspecified organism: J18.9

## 2020-12-17 HISTORY — DX: Personal history of urinary calculi: Z87.442

## 2020-12-17 HISTORY — DX: Personal history of other diseases of the digestive system: Z87.19

## 2020-12-17 HISTORY — DX: Anemia, unspecified: D64.9

## 2020-12-17 HISTORY — DX: Dependence on supplemental oxygen: Z99.81

## 2020-12-17 HISTORY — DX: Cardiac murmur, unspecified: R01.1

## 2020-12-17 HISTORY — DX: Cardiac arrhythmia, unspecified: I49.9

## 2020-12-17 HISTORY — DX: Personal history of other diseases of the circulatory system: Z86.79

## 2020-12-17 LAB — CBC
HCT: 51.5 % — ABNORMAL HIGH (ref 36.0–46.0)
Hemoglobin: 17 g/dL — ABNORMAL HIGH (ref 12.0–15.0)
MCH: 29.7 pg (ref 26.0–34.0)
MCHC: 33 g/dL (ref 30.0–36.0)
MCV: 90 fL (ref 80.0–100.0)
Platelets: 322 10*3/uL (ref 150–400)
RBC: 5.72 MIL/uL — ABNORMAL HIGH (ref 3.87–5.11)
RDW: 14.6 % (ref 11.5–15.5)
WBC: 9.8 10*3/uL (ref 4.0–10.5)
nRBC: 0 % (ref 0.0–0.2)

## 2020-12-17 LAB — BASIC METABOLIC PANEL
Anion gap: 8 (ref 5–15)
BUN: 5 mg/dL — ABNORMAL LOW (ref 6–20)
CO2: 28 mmol/L (ref 22–32)
Calcium: 9.1 mg/dL (ref 8.9–10.3)
Chloride: 101 mmol/L (ref 98–111)
Creatinine, Ser: 0.6 mg/dL (ref 0.44–1.00)
GFR, Estimated: >60 mL/min (ref >60)
Glucose, Bld: 111 mg/dL — ABNORMAL HIGH (ref 70–99)
Potassium: 3.7 mmol/L (ref 3.5–5.1)
Sodium: 137 mmol/L (ref 135–145)

## 2020-12-17 LAB — TYPE AND SCREEN
ABO/RH(D): A POS
Antibody Screen: NEGATIVE

## 2020-12-17 LAB — SURGICAL PCR SCREEN
MRSA, PCR: POSITIVE — AB
Staphylococcus aureus: POSITIVE — AB

## 2020-12-17 LAB — SARS CORONAVIRUS 2 (TAT 6-24 HRS): SARS Coronavirus 2: NEGATIVE

## 2020-12-17 LAB — GLUCOSE, CAPILLARY: Glucose-Capillary: 101 mg/dL — ABNORMAL HIGH (ref 70–99)

## 2020-12-17 NOTE — Progress Notes (Signed)
PCP - Caryl Pina, MD Cardiologist - Denies  PPM/ICD - Denies  Chest x-ray - 07/18/20 EKG - 07/18/20 Stress Test - 08/17/12 ECHO - Per pt, ~ 1989 @ [facility] Cardiac Cath - Denies  Sleep Study - Yes, Positive for OSA; BiPAP nightly  Patient is diabetic but does not check fasting CBGs. CBG at PAT appointment was 101. A1C obtained  Blood Thinner Instructions: N/A Aspirin Instructions: N/A  ERAS Protcol - N/A PRE-SURGERY Ensure or G2- N/A  COVID TEST- 12/17/20 (Pt case booked as "Outpatient In Bed")   Anesthesia review: Yes, cardiac and pulmonary hx.  Patient denies shortness of breath, fever, cough and chest pain at PAT appointment   All instructions explained to the patient, with a verbal understanding of the material. Patient agrees to go over the instructions while at home for a better understanding. Patient also instructed to self quarantine after being tested for COVID-19. The opportunity to ask questions was provided.

## 2020-12-18 ENCOUNTER — Ambulatory Visit (HOSPITAL_COMMUNITY)
Admission: RE | Admit: 2020-12-18 | Discharge: 2020-12-18 | Disposition: A | Discharge status: Home / Self Care | Payer: Medicaid Other | Source: Ambulatory Visit | Attending: Ophthalmology | Admitting: Ophthalmology

## 2020-12-18 ENCOUNTER — Ambulatory Visit (HOSPITAL_COMMUNITY): Payer: Medicaid Other | Admitting: Certified Registered Nurse Anesthetist

## 2020-12-18 ENCOUNTER — Ambulatory Visit (HOSPITAL_COMMUNITY): Payer: Medicaid Other

## 2020-12-18 ENCOUNTER — Encounter (HOSPITAL_COMMUNITY)
Admission: RE | Disposition: A | Discharge status: Home / Self Care | Payer: Self-pay | Source: Home / Self Care | Attending: Neurosurgery

## 2020-12-18 ENCOUNTER — Ambulatory Visit (HOSPITAL_COMMUNITY): Payer: Medicaid Other | Admitting: Physician Assistant

## 2020-12-18 ENCOUNTER — Ambulatory Visit (HOSPITAL_COMMUNITY)
Admission: RE | Admit: 2020-12-18 | Discharge: 2020-12-19 | Disposition: A | Discharge status: Home / Self Care | Payer: Medicaid Other | Attending: Neurosurgery | Admitting: Neurosurgery

## 2020-12-18 ENCOUNTER — Encounter (HOSPITAL_COMMUNITY): Payer: Self-pay | Admitting: Neurosurgery

## 2020-12-18 DIAGNOSIS — Z7984 Long term (current) use of oral hypoglycemic drugs: Secondary | ICD-10-CM | POA: Insufficient documentation

## 2020-12-18 DIAGNOSIS — M4712 Other spondylosis with myelopathy, cervical region: Secondary | ICD-10-CM | POA: Diagnosis not present

## 2020-12-18 DIAGNOSIS — M5000 Cervical disc disorder with myelopathy, unspecified cervical region: Secondary | ICD-10-CM | POA: Diagnosis present

## 2020-12-18 DIAGNOSIS — M532X2 Spinal instabilities, cervical region: Secondary | ICD-10-CM | POA: Diagnosis not present

## 2020-12-18 DIAGNOSIS — M542 Cervicalgia: Secondary | ICD-10-CM

## 2020-12-18 DIAGNOSIS — M4802 Spinal stenosis, cervical region: Secondary | ICD-10-CM | POA: Diagnosis not present

## 2020-12-18 DIAGNOSIS — Z79899 Other long term (current) drug therapy: Secondary | ICD-10-CM | POA: Insufficient documentation

## 2020-12-18 DIAGNOSIS — M50022 Cervical disc disorder at C5-C6 level with myelopathy: Secondary | ICD-10-CM | POA: Diagnosis not present

## 2020-12-18 DIAGNOSIS — Z419 Encounter for procedure for purposes other than remedying health state, unspecified: Secondary | ICD-10-CM

## 2020-12-18 DIAGNOSIS — M4322 Fusion of spine, cervical region: Secondary | ICD-10-CM | POA: Diagnosis not present

## 2020-12-18 DIAGNOSIS — E785 Hyperlipidemia, unspecified: Secondary | ICD-10-CM | POA: Diagnosis not present

## 2020-12-18 HISTORY — PX: ANTERIOR CERVICAL DECOMP/DISCECTOMY FUSION: SHX1161

## 2020-12-18 LAB — GLUCOSE, CAPILLARY
Glucose-Capillary: 183 mg/dL — ABNORMAL HIGH (ref 70–99)
Glucose-Capillary: 103 mg/dL — ABNORMAL HIGH (ref 70–99)
Glucose-Capillary: 105 mg/dL — ABNORMAL HIGH (ref 70–99)
Glucose-Capillary: 143 mg/dL — ABNORMAL HIGH (ref 70–99)
Glucose-Capillary: 170 mg/dL — ABNORMAL HIGH (ref 70–99)

## 2020-12-18 LAB — ABO/RH: ABO/RH(D): A POS

## 2020-12-18 LAB — HEMOGLOBIN A1C
Hgb A1c MFr Bld: 5.7 % — ABNORMAL HIGH (ref 4.8–5.6)
Mean Plasma Glucose: 117 mg/dL

## 2020-12-18 SURGERY — ANTERIOR CERVICAL DECOMPRESSION/DISCECTOMY FUSION 1 LEVEL
Anesthesia: General | Site: Spine Cervical

## 2020-12-18 MED ORDER — FENTANYL CITRATE (PF) 250 MCG/5ML IJ SOLN
INTRAMUSCULAR | Status: AC
  Filled 2020-12-18: qty 5

## 2020-12-18 MED ORDER — SODIUM CHLORIDE 0.9% FLUSH: 3.0000 mL | INTRAVENOUS | Status: DC | PRN

## 2020-12-18 MED ORDER — SUMATRIPTAN SUCCINATE 50 MG PO TABS
50.0000 mg | ORAL_TABLET | ORAL | Status: DC | PRN
  Filled 2020-12-18: qty 1

## 2020-12-18 MED ORDER — MOMETASONE FURO-FORMOTEROL FUM 100-5 MCG/ACT IN AERO
2.0000 | INHALATION_SPRAY | Freq: Two times a day (BID) | RESPIRATORY_TRACT | Status: DC
  Administered 2020-12-18 – 2020-12-19 (×2): 2 via RESPIRATORY_TRACT
  Filled 2020-12-18: qty 8.8

## 2020-12-18 MED ORDER — OXYCODONE HCL 5 MG PO TABS
5.0000 mg | ORAL_TABLET | Freq: Once | ORAL | Status: AC | PRN
  Administered 2020-12-18: 5 mg via ORAL

## 2020-12-18 MED ORDER — LACTATED RINGERS IV SOLN: INTRAVENOUS | Status: DC

## 2020-12-18 MED ORDER — OXYCODONE HCL 5 MG PO TABS
5.0000 mg | ORAL_TABLET | ORAL | Status: DC | PRN
  Administered 2020-12-19 (×2): 5 mg via ORAL
  Filled 2020-12-18 (×2): qty 1

## 2020-12-18 MED ORDER — OXYCODONE HCL 5 MG PO TABS
ORAL_TABLET | ORAL | Status: AC
  Filled 2020-12-18: qty 1

## 2020-12-18 MED ORDER — THROMBIN 5000 UNITS EX SOLR
CUTANEOUS | Status: DC | PRN
  Administered 2020-12-18 (×2): 5000 [IU] via TOPICAL

## 2020-12-18 MED ORDER — OXYCODONE HCL 5 MG PO TABS
5.0000 mg | ORAL_TABLET | Freq: Four times a day (QID) | ORAL | Status: DC | PRN
  Filled 2020-12-18: qty 1

## 2020-12-18 MED ORDER — ONDANSETRON HCL 4 MG PO TABS: 4.0000 mg | ORAL_TABLET | Freq: Four times a day (QID) | ORAL | Status: DC | PRN

## 2020-12-18 MED ORDER — HEMOSTATIC AGENTS (NO CHARGE) OPTIME
TOPICAL | Status: DC | PRN
  Administered 2020-12-18: 1 via TOPICAL

## 2020-12-18 MED ORDER — OXYCODONE-ACETAMINOPHEN 5-325 MG PO TABS
1.0000 | ORAL_TABLET | Freq: Four times a day (QID) | ORAL | Status: DC | PRN
  Administered 2020-12-18: 1 via ORAL
  Filled 2020-12-18: qty 1

## 2020-12-18 MED ORDER — ONDANSETRON HCL 4 MG/2ML IJ SOLN: 4.0000 mg | Freq: Four times a day (QID) | INTRAMUSCULAR | Status: DC | PRN

## 2020-12-18 MED ORDER — PHENYLEPHRINE 40 MCG/ML (10ML) SYRINGE FOR IV PUSH (FOR BLOOD PRESSURE SUPPORT)
PREFILLED_SYRINGE | INTRAVENOUS | Status: DC | PRN
  Administered 2020-12-18 (×2): 80 ug via INTRAVENOUS

## 2020-12-18 MED ORDER — DEXAMETHASONE SODIUM PHOSPHATE 10 MG/ML IJ SOLN
INTRAMUSCULAR | Status: AC
  Filled 2020-12-18: qty 1

## 2020-12-18 MED ORDER — KETOROLAC TROMETHAMINE 30 MG/ML IJ SOLN
INTRAMUSCULAR | Status: AC
  Filled 2020-12-18: qty 1

## 2020-12-18 MED ORDER — RIZATRIPTAN BENZOATE 10 MG PO TBDP: 10.0000 mg | ORAL_TABLET | ORAL | Status: DC | PRN

## 2020-12-18 MED ORDER — HYDROMORPHONE HCL 1 MG/ML IJ SOLN: 0.5000 mg | INTRAMUSCULAR | Status: DC | PRN

## 2020-12-18 MED ORDER — ALUM & MAG HYDROXIDE-SIMETH 200-200-20 MG/5ML PO SUSP: 30.0000 mL | Freq: Four times a day (QID) | ORAL | Status: DC | PRN

## 2020-12-18 MED ORDER — ACETAMINOPHEN 325 MG PO TABS: 650.0000 mg | ORAL_TABLET | ORAL | Status: DC | PRN

## 2020-12-18 MED ORDER — CEFAZOLIN SODIUM-DEXTROSE 2-4 GM/100ML-% IV SOLN
2.0000 g | Freq: Three times a day (TID) | INTRAVENOUS | Status: AC
  Administered 2020-12-18 – 2020-12-19 (×2): 2 g via INTRAVENOUS
  Filled 2020-12-18 (×2): qty 100

## 2020-12-18 MED ORDER — PREGABALIN 75 MG PO CAPS
150.0000 mg | ORAL_CAPSULE | Freq: Three times a day (TID) | ORAL | Status: DC
  Administered 2020-12-18: 150 mg via ORAL
  Filled 2020-12-18: qty 2

## 2020-12-18 MED ORDER — PROPOFOL 10 MG/ML IV BOLUS
INTRAVENOUS | Status: DC | PRN
  Administered 2020-12-18: 150 mg via INTRAVENOUS

## 2020-12-18 MED ORDER — BACLOFEN 10 MG PO TABS: 10.0000 mg | ORAL_TABLET | Freq: Three times a day (TID) | ORAL | Status: DC | PRN

## 2020-12-18 MED ORDER — ORAL CARE MOUTH RINSE: 15.0000 mL | Freq: Once | OROMUCOSAL | Status: AC

## 2020-12-18 MED ORDER — PHENYLEPHRINE HCL-NACL 10-0.9 MG/250ML-% IV SOLN
INTRAVENOUS | Status: DC | PRN
  Administered 2020-12-18: 25 ug/min via INTRAVENOUS

## 2020-12-18 MED ORDER — ENALAPRIL MALEATE 5 MG PO TABS
5.0000 mg | ORAL_TABLET | Freq: Every day | ORAL | Status: DC
  Filled 2020-12-18: qty 1

## 2020-12-18 MED ORDER — CYCLOBENZAPRINE HCL 10 MG PO TABS: 10.0000 mg | ORAL_TABLET | Freq: Three times a day (TID) | ORAL | Status: DC | PRN

## 2020-12-18 MED ORDER — NICOTINE 21 MG/24HR TD PT24: 21.0000 mg | MEDICATED_PATCH | Freq: Every day | TRANSDERMAL | Status: DC

## 2020-12-18 MED ORDER — ROCURONIUM BROMIDE 10 MG/ML (PF) SYRINGE
PREFILLED_SYRINGE | INTRAVENOUS | Status: DC | PRN
  Administered 2020-12-18: 60 mg via INTRAVENOUS
  Administered 2020-12-18: 20 mg via INTRAVENOUS

## 2020-12-18 MED ORDER — CHLORHEXIDINE GLUCONATE CLOTH 2 % EX PADS: 6.0000 | MEDICATED_PAD | Freq: Once | CUTANEOUS | Status: DC

## 2020-12-18 MED ORDER — MENTHOL 3 MG MT LOZG
1.0000 | LOZENGE | OROMUCOSAL | Status: DC | PRN
  Filled 2020-12-18: qty 9

## 2020-12-18 MED ORDER — ALBUTEROL SULFATE HFA 108 (90 BASE) MCG/ACT IN AERS
INHALATION_SPRAY | RESPIRATORY_TRACT | Status: DC | PRN
  Administered 2020-12-18: 8 via RESPIRATORY_TRACT

## 2020-12-18 MED ORDER — PROMETHAZINE HCL 25 MG/ML IJ SOLN: 6.2500 mg | INTRAMUSCULAR | Status: DC | PRN

## 2020-12-18 MED ORDER — ADULT MULTIVITAMIN W/MINERALS CH
1.0000 | ORAL_TABLET | Freq: Every day | ORAL | Status: DC
  Filled 2020-12-18: qty 1

## 2020-12-18 MED ORDER — DULOXETINE HCL 30 MG PO CPEP
30.0000 mg | ORAL_CAPSULE | Freq: Every day | ORAL | Status: DC
  Administered 2020-12-18: 30 mg via ORAL
  Filled 2020-12-18: qty 1

## 2020-12-18 MED ORDER — FENTANYL CITRATE (PF) 100 MCG/2ML IJ SOLN
INTRAMUSCULAR | Status: AC
  Administered 2020-12-18: 25 ug via INTRAVENOUS
  Filled 2020-12-18: qty 2

## 2020-12-18 MED ORDER — CEFAZOLIN SODIUM-DEXTROSE 2-4 GM/100ML-% IV SOLN
2.0000 g | INTRAVENOUS | Status: AC
  Administered 2020-12-18: 2 g via INTRAVENOUS
  Filled 2020-12-18: qty 100

## 2020-12-18 MED ORDER — NITROFURANTOIN MACROCRYSTAL 50 MG PO CAPS
100.0000 mg | ORAL_CAPSULE | Freq: Every day | ORAL | Status: DC
  Filled 2020-12-18: qty 1

## 2020-12-18 MED ORDER — THROMBIN 5000 UNITS EX SOLR
CUTANEOUS | Status: AC
  Filled 2020-12-18: qty 15000

## 2020-12-18 MED ORDER — ACETAMINOPHEN 650 MG RE SUPP: 650.0000 mg | RECTAL | Status: DC | PRN

## 2020-12-18 MED ORDER — MIDAZOLAM HCL 2 MG/2ML IJ SOLN
INTRAMUSCULAR | Status: DC | PRN
  Administered 2020-12-18: 2 mg via INTRAVENOUS

## 2020-12-18 MED ORDER — OMEGA-3-ACID ETHYL ESTERS 1 G PO CAPS: 1.0000 g | ORAL_CAPSULE | Freq: Every day | ORAL | Status: DC

## 2020-12-18 MED ORDER — THROMBIN 5000 UNITS EX SOLR: OROMUCOSAL | Status: DC | PRN

## 2020-12-18 MED ORDER — HAIR SKIN NAILS PO CAPS: ORAL_CAPSULE | Freq: Every day | ORAL | Status: DC

## 2020-12-18 MED ORDER — ALBUTEROL SULFATE HFA 108 (90 BASE) MCG/ACT IN AERS
1.0000 | INHALATION_SPRAY | Freq: Four times a day (QID) | RESPIRATORY_TRACT | Status: DC | PRN
  Administered 2020-12-18: 1 via RESPIRATORY_TRACT

## 2020-12-18 MED ORDER — CHLORHEXIDINE GLUCONATE 0.12 % MT SOLN
15.0000 mL | Freq: Once | OROMUCOSAL | Status: AC
  Administered 2020-12-18: 15 mL via OROMUCOSAL
  Filled 2020-12-18: qty 15

## 2020-12-18 MED ORDER — KETOROLAC TROMETHAMINE 30 MG/ML IJ SOLN: 30.0000 mg | Freq: Once | INTRAMUSCULAR | Status: DC | PRN

## 2020-12-18 MED ORDER — OXYCODONE HCL 5 MG/5ML PO SOLN: 5.0000 mg | Freq: Once | ORAL | Status: AC | PRN

## 2020-12-18 MED ORDER — VITAMIN D 25 MCG (1000 UNIT) PO TABS: 1000.0000 [IU] | ORAL_TABLET | Freq: Every day | ORAL | Status: DC

## 2020-12-18 MED ORDER — PANTOPRAZOLE SODIUM 40 MG IV SOLR
40.0000 mg | Freq: Every day | INTRAVENOUS | Status: DC
  Administered 2020-12-18: 40 mg via INTRAVENOUS
  Filled 2020-12-18: qty 40

## 2020-12-18 MED ORDER — SODIUM CHLORIDE 0.9% FLUSH
3.0000 mL | Freq: Two times a day (BID) | INTRAVENOUS | Status: DC
  Administered 2020-12-18: 3 mL via INTRAVENOUS

## 2020-12-18 MED ORDER — SODIUM CHLORIDE 0.9 % IV SOLN: 250.0000 mL | INTRAVENOUS | Status: DC

## 2020-12-18 MED ORDER — ATORVASTATIN CALCIUM 40 MG PO TABS: 40.0000 mg | ORAL_TABLET | Freq: Every day | ORAL | Status: DC

## 2020-12-18 MED ORDER — OXYCODONE ER 13.5 MG PO C12A: 13.5000 mg | EXTENDED_RELEASE_CAPSULE | Freq: Two times a day (BID) | ORAL | Status: DC

## 2020-12-18 MED ORDER — PHENYLEPHRINE 40 MCG/ML (10ML) SYRINGE FOR IV PUSH (FOR BLOOD PRESSURE SUPPORT)
PREFILLED_SYRINGE | INTRAVENOUS | Status: AC
  Filled 2020-12-18: qty 10

## 2020-12-18 MED ORDER — METFORMIN HCL 500 MG PO TABS
1000.0000 mg | ORAL_TABLET | Freq: Two times a day (BID) | ORAL | Status: DC
  Administered 2020-12-19: 1000 mg via ORAL
  Filled 2020-12-18: qty 2

## 2020-12-18 MED ORDER — SUGAMMADEX SODIUM 200 MG/2ML IV SOLN
INTRAVENOUS | Status: DC | PRN
  Administered 2020-12-18: 150 mg via INTRAVENOUS

## 2020-12-18 MED ORDER — DEXAMETHASONE SODIUM PHOSPHATE 10 MG/ML IJ SOLN
10.0000 mg | Freq: Once | INTRAMUSCULAR | Status: AC
  Administered 2020-12-18: 10 mg via INTRAVENOUS
  Filled 2020-12-18: qty 1

## 2020-12-18 MED ORDER — PROPOFOL 10 MG/ML IV BOLUS
INTRAVENOUS | Status: AC
  Filled 2020-12-18: qty 20

## 2020-12-18 MED ORDER — OXYCODONE HCL ER 15 MG PO T12A
15.0000 mg | EXTENDED_RELEASE_TABLET | Freq: Two times a day (BID) | ORAL | Status: DC
  Administered 2020-12-18: 15 mg via ORAL
  Filled 2020-12-18: qty 1

## 2020-12-18 MED ORDER — UMECLIDINIUM BROMIDE 62.5 MCG/INH IN AEPB
1.0000 | INHALATION_SPRAY | Freq: Every day | RESPIRATORY_TRACT | Status: DC
  Administered 2020-12-19: 1 via RESPIRATORY_TRACT
  Filled 2020-12-18: qty 7

## 2020-12-18 MED ORDER — SITAGLIPTIN PHOS-METFORMIN HCL 50-1000 MG PO TABS: 1.0000 | ORAL_TABLET | Freq: Two times a day (BID) | ORAL | Status: DC

## 2020-12-18 MED ORDER — OXYCODONE-ACETAMINOPHEN 10-325 MG PO TABS: 1.0000 | ORAL_TABLET | Freq: Four times a day (QID) | ORAL | Status: DC | PRN

## 2020-12-18 MED ORDER — GUAIFENESIN ER 600 MG PO TB12
1200.0000 mg | ORAL_TABLET | Freq: Two times a day (BID) | ORAL | Status: DC
  Administered 2020-12-18: 1200 mg via ORAL
  Filled 2020-12-18 (×2): qty 2

## 2020-12-18 MED ORDER — 0.9 % SODIUM CHLORIDE (POUR BTL) OPTIME
TOPICAL | Status: DC | PRN
  Administered 2020-12-18: 1000 mL

## 2020-12-18 MED ORDER — LINAGLIPTIN 5 MG PO TABS
5.0000 mg | ORAL_TABLET | Freq: Every day | ORAL | Status: DC
  Filled 2020-12-18: qty 1

## 2020-12-18 MED ORDER — CYCLOBENZAPRINE HCL 10 MG PO TABS
10.0000 mg | ORAL_TABLET | Freq: Three times a day (TID) | ORAL | Status: DC | PRN
  Administered 2020-12-18 – 2020-12-19 (×2): 10 mg via ORAL
  Filled 2020-12-18 (×2): qty 1

## 2020-12-18 MED ORDER — ALBUTEROL SULFATE HFA 108 (90 BASE) MCG/ACT IN AERS
INHALATION_SPRAY | RESPIRATORY_TRACT | Status: AC
  Filled 2020-12-18: qty 6.7

## 2020-12-18 MED ORDER — MIDAZOLAM HCL 2 MG/2ML IJ SOLN
INTRAMUSCULAR | Status: AC
  Filled 2020-12-18: qty 2

## 2020-12-18 MED ORDER — FENTANYL CITRATE (PF) 250 MCG/5ML IJ SOLN
INTRAMUSCULAR | Status: DC | PRN
  Administered 2020-12-18: 50 ug via INTRAVENOUS
  Administered 2020-12-18: 150 ug via INTRAVENOUS
  Administered 2020-12-18 (×2): 50 ug via INTRAVENOUS

## 2020-12-18 MED ORDER — ACETAMINOPHEN 500 MG PO TABS
1000.0000 mg | ORAL_TABLET | Freq: Once | ORAL | Status: AC
  Administered 2020-12-18: 1000 mg via ORAL
  Filled 2020-12-18: qty 2

## 2020-12-18 MED ORDER — ONDANSETRON HCL 4 MG/2ML IJ SOLN
INTRAMUSCULAR | Status: DC | PRN
  Administered 2020-12-18: 4 mg via INTRAVENOUS

## 2020-12-18 MED ORDER — SODIUM CHLORIDE 0.9 % IN NEBU
3.0000 mL | INHALATION_SOLUTION | Freq: Three times a day (TID) | RESPIRATORY_TRACT | Status: DC | PRN
  Filled 2020-12-18: qty 3

## 2020-12-18 MED ORDER — FENTANYL CITRATE (PF) 100 MCG/2ML IJ SOLN: 25.0000 ug | INTRAMUSCULAR | Status: DC | PRN

## 2020-12-18 MED ORDER — AMISULPRIDE (ANTIEMETIC) 5 MG/2ML IV SOLN: 10.0000 mg | Freq: Once | INTRAVENOUS | Status: DC | PRN

## 2020-12-18 MED ORDER — TIOTROPIUM BROMIDE MONOHYDRATE 18 MCG IN CAPS: 18.0000 ug | ORAL_CAPSULE | Freq: Every day | RESPIRATORY_TRACT | Status: DC

## 2020-12-18 MED ORDER — PHENOL 1.4 % MT LIQD: 1.0000 | OROMUCOSAL | Status: DC | PRN

## 2020-12-18 MED ORDER — OXYCODONE-ACETAMINOPHEN 5-325 MG PO TABS
1.0000 | ORAL_TABLET | ORAL | Status: DC | PRN
  Administered 2020-12-19 (×2): 1 via ORAL
  Filled 2020-12-18 (×2): qty 1

## 2020-12-18 MED ORDER — LIDOCAINE 2% (20 MG/ML) 5 ML SYRINGE
INTRAMUSCULAR | Status: DC | PRN
  Administered 2020-12-18: 60 mg via INTRAVENOUS

## 2020-12-18 MED ORDER — ALBUTEROL SULFATE (2.5 MG/3ML) 0.083% IN NEBU
2.5000 mg | INHALATION_SOLUTION | RESPIRATORY_TRACT | Status: DC | PRN
  Filled 2020-12-18: qty 3

## 2020-12-18 SURGICAL SUPPLY — 58 items
BAND RUBBER #18 3X1/16 STRL (MISCELLANEOUS) ×4 IMPLANT
BASKET BONE COLLECTION (BASKET) ×2 IMPLANT
BENZOIN TINCTURE PRP APPL 2/3 (GAUZE/BANDAGES/DRESSINGS) ×2 IMPLANT
BIT DRILL NEURO 2X3.1 SFT TUCH (MISCELLANEOUS) ×1 IMPLANT
BONE VIVIGEN FORMABLE 1.3CC (Bone Implant) ×2 IMPLANT
BUR MATCHSTICK NEURO 3.0 LAGG (BURR) ×2 IMPLANT
CANISTER SUCT 3000ML PPV (MISCELLANEOUS) ×2 IMPLANT
CARTRIDGE OIL MAESTRO DRILL (MISCELLANEOUS) ×1 IMPLANT
COVER WAND RF STERILE (DRAPES) ×2 IMPLANT
DERMABOND ADVANCED (GAUZE/BANDAGES/DRESSINGS) ×1
DERMABOND ADVANCED .7 DNX12 (GAUZE/BANDAGES/DRESSINGS) ×1 IMPLANT
DIFFUSER DRILL AIR PNEUMATIC (MISCELLANEOUS) ×2 IMPLANT
DRAPE C-ARM 42X72 X-RAY (DRAPES) ×4 IMPLANT
DRAPE LAPAROTOMY 100X72 PEDS (DRAPES) ×2 IMPLANT
DRAPE MICROSCOPE LEICA (MISCELLANEOUS) ×2 IMPLANT
DRILL NEURO 2X3.1 SOFT TOUCH (MISCELLANEOUS) ×2
DRSG OPSITE POSTOP 4X6 (GAUZE/BANDAGES/DRESSINGS) ×2 IMPLANT
DURAPREP 6ML APPLICATOR 50/CS (WOUND CARE) ×2 IMPLANT
ELECT COATED BLADE 2.86 ST (ELECTRODE) ×2 IMPLANT
ELECT REM PT RETURN 9FT ADLT (ELECTROSURGICAL) ×2
ELECTRODE REM PT RTRN 9FT ADLT (ELECTROSURGICAL) ×1 IMPLANT
GAUZE 4X4 16PLY RFD (DISPOSABLE) IMPLANT
GAUZE SPONGE 4X4 12PLY STRL (GAUZE/BANDAGES/DRESSINGS) ×2 IMPLANT
GLOVE BIO SURGEON STRL SZ7 (GLOVE) IMPLANT
GLOVE BIO SURGEON STRL SZ8 (GLOVE) ×2 IMPLANT
GLOVE EXAM NITRILE XL STR (GLOVE) IMPLANT
GLOVE INDICATOR 8.5 STRL (GLOVE) ×2 IMPLANT
GLOVE SURG UNDER POLY LF SZ7 (GLOVE) IMPLANT
GOWN STRL REUS W/ TWL LRG LVL3 (GOWN DISPOSABLE) ×1 IMPLANT
GOWN STRL REUS W/ TWL XL LVL3 (GOWN DISPOSABLE) ×1 IMPLANT
GOWN STRL REUS W/TWL 2XL LVL3 (GOWN DISPOSABLE) ×2 IMPLANT
GOWN STRL REUS W/TWL LRG LVL3 (GOWN DISPOSABLE) ×2
GOWN STRL REUS W/TWL XL LVL3 (GOWN DISPOSABLE) ×2
HALTER HD/CHIN CERV TRACTION D (MISCELLANEOUS) ×2 IMPLANT
HEMOSTAT POWDER KIT SURGIFOAM (HEMOSTASIS) ×2 IMPLANT
KIT BASIN OR (CUSTOM PROCEDURE TRAY) ×2 IMPLANT
KIT TURNOVER KIT B (KITS) ×2 IMPLANT
NEEDLE HYPO 18GX1.5 BLUNT FILL (NEEDLE) ×2 IMPLANT
NEEDLE SPNL 20GX3.5 QUINCKE YW (NEEDLE) ×2 IMPLANT
NS IRRIG 1000ML POUR BTL (IV SOLUTION) ×2 IMPLANT
OIL CARTRIDGE MAESTRO DRILL (MISCELLANEOUS) ×2
PACK LAMINECTOMY NEURO (CUSTOM PROCEDURE TRAY) ×2 IMPLANT
PAD ARMBOARD 7.5X6 YLW CONV (MISCELLANEOUS) ×6 IMPLANT
PIN DISTRACTION 14MM (PIN) IMPLANT
PLATE ANT CERV XTEND 1 LV 14 (Plate) IMPLANT
SCREW VAR 4.2 XD SELF DRILL 12 (Screw) IMPLANT
SCREW VAR 4.2 XD SELF DRILL 14 (Screw) IMPLANT
SCREW XTEND SELF DRILL 4.6X14 (Screw) ×8 IMPLANT
SPACER HEDRON C 12X14X7 0D (Spacer) ×2 IMPLANT
SPONGE INTESTINAL PEANUT (DISPOSABLE) ×2 IMPLANT
SPONGE SURGIFOAM ABS GEL SZ50 (HEMOSTASIS) ×2 IMPLANT
STRIP CLOSURE SKIN 1/2X4 (GAUZE/BANDAGES/DRESSINGS) ×2 IMPLANT
SUT VIC AB 3-0 SH 8-18 (SUTURE) ×2 IMPLANT
SUT VICRYL 4-0 PS2 18IN ABS (SUTURE) ×2 IMPLANT
TAPE CLOTH 4X10 WHT NS (GAUZE/BANDAGES/DRESSINGS) IMPLANT
TOWEL GREEN STERILE (TOWEL DISPOSABLE) ×2 IMPLANT
TOWEL GREEN STERILE FF (TOWEL DISPOSABLE) ×2 IMPLANT
WATER STERILE IRR 1000ML POUR (IV SOLUTION) ×2 IMPLANT

## 2020-12-18 NOTE — Transfer of Care (Signed)
Immediate Anesthesia Transfer of Care Note  Patient: Margaret Vaughn  Procedure(s) Performed: CERVICAL TWO-THREE ANTERIOR CERVICAL DISCECTOMY/DECOMPRESSION FUSION (N/A Spine Cervical)  Patient Location: PACU  Anesthesia Type:General  Level of Consciousness: awake and alert   Airway & Oxygen Therapy: Patient Spontanous Breathing and Patient connected to face mask oxygen  Post-op Assessment: Report given to RN and Post -op Vital signs reviewed and stable  Post vital signs: Reviewed and stable  Last Vitals:  Vitals Value Taken Time  BP 149/81 12/18/20 1457  Temp    Pulse 112 12/18/20 1502  Resp 18 12/18/20 1502  SpO2 91 % 12/18/20 1502  Vitals shown include unvalidated device data.  Last Pain:  Vitals:   12/18/20 0928  TempSrc:   PainSc: 7          Complications: No complications documented.

## 2020-12-18 NOTE — Progress Notes (Signed)
Orthopedic Tech Progress Note Patient Details:  NAIMA VELDHUIZEN [birth year] [id number] Dropped off ASPEN CERVICAL COLLAR to OR @ 1317. Patient ID: LATIQUA DALOIA, female   DOB: January 23, 1961, 60 y.o.   MRN: [id number]   Janit Pagan 12/18/2020, 6:43 PM

## 2020-12-18 NOTE — H&P (Signed)
Margaret Vaughn is an 60 y.o. female.   Chief Complaint: neck pain HPI: Patient presented to the ER a couole months ago with a quaraparesis and underwen emergent posterior cervical decompression,  She has improved neruologic function but has has increased neck pain and orkup revealed instability at C23.  Presents for ACDF C23.  I have extensively gone over the risks and benefits of the procedure with her as well as peri-operative course, expectations of outcome and alternatives to surgery.  She understands and agrees to proceed forward.  Past Medical History:  Diagnosis Date  . Acid reflux   . Allergies   . Anemia   . Asthma   . Bronchitis   . Cervical stenosis of spine   . Chronic pain syndrome   . COPD (chronic obstructive pulmonary disease) ([place])   . DDD (degenerative disc disease), lumbar   . Depression with anxiety   . Diabetes mellitus without complication ([place])   . Dysrhythmia    PSVT  . Fatigue   . H/O blood clots   . H/O echocardiogram 1989   Per pt, done @ [place] mitral valve prolapse  . Headache(784.0)   . Heart murmur   . Hemorrhoids, external   . History of hiatal hernia   . History of kidney stones   . History of PSVT (paroxysmal supraventricular tachycardia)   . HTN (hypertension)   . Hyperlipidemia   . Hypoxemia   . Insomnia   . Memory loss   . Myalgia and myositis   . Narcolepsy   . On home O2    2-4 L @ HS  . OSA (obstructive sleep apnea)   . Pneumonia   . Sleeping difficulty   . 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 Thoracalgia   . Tobacco abuse   . Tremor   . Ulcer disease     Past Surgical History:  Procedure Laterality Date  . ABDOMINAL HYSTERECTOMY    . APPENDECTOMY  1992  . CHOLECYSTECTOMY  1992  . DIAGNOSTIC LAPAROSCOPY  1992   lap chole  . HERNIA REPAIR     umbilical... as a child  . OOPHORECTOMY    . POSTERIOR CERVICAL FUSION/FORAMINOTOMY N/A 08/07/2020   Procedure: POSTERIOR CERVICAL LAMINECTOMY FOR MYELOPATHY;  Surgeon:  Kary Kos, MD;  Location: [facility];  Service: Neurosurgery;  Laterality: N/A;  . POSTERIOR CERVICAL LAMINECTOMY  08/07/2020   w/ Dr. Saintclair Halsted  . tonsillectomy    . TONSILLECTOMY    . TUBAL LIGATION      Family History  Problem Relation Age of Onset  . Ulcers Mother   . Sleep apnea Mother   . Migraines Mother   . Cancer Mother        uterine  . Arthritis Mother   . Hypertension Mother   . Heart disease Mother   . Depression Mother   . Hyperlipidemia Mother   . Colon cancer Mother   . Arrhythmia Son    Social History:  reports that she has been smoking cigarettes. She has a 60.00 pack-year smoking history. She has never used smokeless tobacco. She reports that she does not drink alcohol and does not use drugs.  Allergies:  Allergies  Allergen Reactions  . Iohexol Anaphylaxis    PT.GIVEN OMNI 300 FOR CT ANGIO WAS GIVEN 50 MG.BENADRYL PRIOR TO SCAN HAD NO PROBLEMS   . Moxifloxacin Anaphylaxis  . Shellfish Allergy Hives    Medications Prior to Admission  Medication Sig Dispense Refill  . albuterol (VENTOLIN  HFA) 108 (90 Base) MCG/ACT inhaler TAKE 2 PUFFS BY MOUTH EVERY 6 HOURS AS NEEDED FOR WHEEZE OR SHORTNESS OF BREATH (Patient taking differently: Inhale 1 puff into the lungs every 6 (six) hours as needed for shortness of breath.) 6.7 g 0  . atorvastatin (LIPITOR) 40 MG tablet Take 1 tablet (40 mg total) by mouth daily. 90 tablet 3  . baclofen (LIORESAL) 10 MG tablet Take 1 tablet (10 mg total) by mouth 3 (three) times daily. (Patient taking differently: Take 10 mg by mouth 3 (three) times daily as needed for muscle spasms.) 90 each 3  . cholecalciferol (VITAMIN D3) 25 MCG (1000 UNIT) tablet Take 1,000 Units by mouth daily.    . cyclobenzaprine (FLEXERIL) 10 MG tablet Take 10 mg by mouth 3 (three) times daily as needed for muscle spasms.    . DULoxetine (CYMBALTA) 30 MG capsule Take 1 capsule (30 mg total) by mouth daily. 30 capsule 2  . guaiFENesin (MUCINEX) 600 MG 12 hr tablet Take  1,200 mg by mouth 2 (two) times daily.    . Multiple Vitamins-Minerals (HAIR SKIN NAILS PO) Take 3 tablets by mouth daily.    . Multiple Vitamins-Minerals (MULTIVITAMIN WITH MINERALS) tablet Take 1 tablet by mouth daily.    . nicotine (NICODERM CQ - DOSED IN MG/24 HOURS) 21 mg/24hr patch Place 21 mg onto the skin daily.    . nitrofurantoin (MACRODANTIN) 100 MG capsule Take 100 mg by mouth daily.    . Omega-3 Fatty Acids (FISH OIL) 1000 MG CAPS Take 4 capsules by mouth daily.    Marland Kitchen oxyCODONE-acetaminophen (PERCOCET) 10-325 MG tablet Take 1 tablet by mouth 4 (four) times daily as needed for pain.    . pregabalin (LYRICA) 150 MG capsule Take 1 capsule (150 mg total) by mouth 3 (three) times daily. 90 capsule 2  . sitaGLIPtin-metformin (JANUMET) 50-1000 MG tablet Take 1 tablet by mouth 2 (two) times daily with a meal. 180 tablet 3  . SPIRIVA HANDIHALER 18 MCG inhalation capsule INHALE CONTENTS OF 1 CAPSULE VIA HANDIHALER ONCE DAILY AT THE SAME TIME EVERY DAY (Patient taking differently: Place 18 mcg into inhaler and inhale daily. INHALE CONTENTS OF 1 CAPSULE VIA HANDIHALER ONCE DAILY AT THE SAME TIME EVERY DAY) 30 capsule 0  . WIXELA INHUB 100-50 MCG/DOSE AEPB INHALE 1 PUFF INTO THE LUNGS TWICE A DAY (Patient taking differently: Inhale 1 puff into the lungs 2 (two) times daily.) 60 each 1  . XTAMPZA ER 13.5 MG C12A Take 13.5 mg by mouth 2 (two) times daily.    . enalapril (VASOTEC) 5 MG tablet Take 1 tablet (5 mg total) by mouth daily. 90 tablet 3  . rizatriptan (MAXALT-MLT) 10 MG disintegrating tablet Take 1 tablet (10 mg total) by mouth as needed for migraine. May repeat in 2 hours if needed 9 tablet 11    Results for orders placed or performed during the hospital encounter of 12/18/20 (from the past 48 hour(s))  Glucose, capillary     Status: Abnormal   Collection Time: 12/18/20  8:08 AM  Result Value Ref Range   Glucose-Capillary 183 (H) 70 - 99 mg/dL    Comment: Glucose reference range applies  only to samples taken after fasting for at least 8 hours.  ABO/Rh     Status: None   Collection Time: 12/18/20  9:10 AM  Result Value Ref Range   ABO/RH(D)      A POS Performed at [facility], [place],  [place] [address]    CT CERVICAL SPINE WO CONTRAST  Result Date: 12/18/2020 CLINICAL DATA:  Neck pain.  Preop for fusion. EXAM: CT CERVICAL SPINE WITHOUT CONTRAST TECHNIQUE: Multidetector CT imaging of the cervical spine was performed without intravenous contrast. Multiplanar CT image reconstructions were also generated. COMPARISON:  12/09/2020 FINDINGS: Alignment: Generalized reversal of cervical lordosis with C2-3 anterolisthesis measuring 4 mm. Skull base and vertebrae: C2-C4 laminectomy. Soft tissues and spinal canal: Granulation tissue appearance at the level of laminectomies. Retropharyngeal course of the carotids. Disc levels: C2-3: Facet osteoarthritis with spurring and joint erosions greater on the right. There is disc narrowing and anterolisthesis. Spinal stenosis with cord impingement by MRI. C3-4: Degenerative facet spurring on the right more than left. Disc space narrowing. Mild to moderate bilateral foraminal narrowing. Patent appearance of the thecal sac C4-5: Degenerative facet spurring with ankylosis on the left. Disc space narrowing with ridging. Mild right and at least moderate left foraminal stenosis C5-6: Disc narrowing with bridging ventral spurring. Uncovertebral ridging asymmetric to the right where there is high-grade foraminal impingement. C6-7: Disc narrowing and ventral spondylitic spurring. Negative facets C7-T1:Disc narrowing and ventral spondylitic spurring. Upper chest: Negative IMPRESSION: 1. No new or acute finding when compared to prior MRI. 2. C2-3 facet arthritis with erosive changes on the right and 4 mm of anterolisthesis. 3. Facet ankylosis on the left at C4-5. Bridging intervertebral osteophytes at C5-6 and C6-7. 4. Notable foraminal narrowing  on the right at C5-6 and left at C4-5. 5. Retropharyngeal course of the carotids. Electronically Signed   By: Monte Fantasia M.D.   On: 12/18/2020 09:26    Review of Systems  Musculoskeletal: Positive for neck pain.  Neurological: Positive for weakness and numbness.    Blood pressure (!) 154/95, pulse (!) 105, temperature 98.1 F (36.7 C), temperature source Oral, resp. rate 18, height 5\' 4"  (1.626 m), weight 65.8 kg, SpO2 93 %. Physical Exam HENT:     Right Ear: Tympanic membrane normal.     Nose: Nose normal.  Eyes:     Pupils: Pupils are equal, round, and reactive to light.  Cardiovascular:     Rate and Rhythm: Normal rate.  Pulmonary:     Effort: Pulmonary effort is normal.  Abdominal:     General: Abdomen is flat.  Musculoskeletal:        General: Normal range of motion.  Skin:    General: Skin is warm.  Neurological:     General: No focal deficit present.     Mental Status: She is alert.     Comments: Diffuse weakness upper and lower extremities improvement froma couple months ago  4-4+/5      Assessment/Plan 60 yo present for ACDF C23  Elaina Hoops, MD 12/18/2020, 11:54 AM

## 2020-12-18 NOTE — Anesthesia Procedure Notes (Signed)
Procedure Name: Intubation Date/Time: 12/18/2020 12:51 PM Performed by: Candis Shine, CRNA Pre-anesthesia Checklist: Patient identified, Emergency Drugs available, Suction available and Patient being monitored Patient Re-evaluated:Patient Re-evaluated prior to induction Oxygen Delivery Method: Circle System Utilized Preoxygenation: Pre-oxygenation with 100% oxygen Induction Type: IV induction Ventilation: Mask ventilation without difficulty and Oral airway inserted - appropriate to patient size Laryngoscope Size: Glidescope and 3 Grade View: Grade I Tube type: Oral Tube size: 7.0 mm Number of attempts: 1 Airway Equipment and Method: Oral airway,  Video-laryngoscopy and Rigid stylet Placement Confirmation: ETT inserted through vocal cords under direct vision,  positive ETCO2 and breath sounds checked- equal and bilateral Secured at: 22 cm Tube secured with: Tape Dental Injury: Teeth and Oropharynx as per pre-operative assessment  Difficulty Due To: Difficult Airway- due to cervical collar Comments: Cervical collar in place. Head and neck remained in neutral position throughout.

## 2020-12-18 NOTE — Anesthesia Preprocedure Evaluation (Addendum)
Anesthesia Evaluation  Patient identified by MRN, date of birth, ID band Patient awake    Reviewed: Allergy & Precautions, NPO status , Patient's Chart, lab work & pertinent test results  Airway Mallampati: III  TM Distance: >3 FB Neck ROM: Full    Dental  (+) Edentulous Upper, Edentulous Lower   Pulmonary asthma , sleep apnea, Continuous Positive Airway Pressure Ventilation and Oxygen sleep apnea , COPD,  COPD inhaler and oxygen dependent, Current SmokerPatient did not abstain from smoking.,    Pulmonary exam normal breath sounds clear to auscultation       Cardiovascular hypertension, Pt. on medications Normal cardiovascular exam+ Valvular Problems/Murmurs  Rhythm:Regular Rate:Normal  ECG: SR, rate 98   Neuro/Psych  Headaches, PSYCHIATRIC DISORDERS Anxiety Depression  Neuromuscular disease  C-spine not cleared    GI/Hepatic hiatal hernia, GERD  Medicated and Controlled,(+)     substance abuse  ,   Endo/Other  diabetes, Oral Hypoglycemic Agents  Renal/GU negative Renal ROS     Musculoskeletal  (+) Arthritis , Fibromyalgia -, narcotic dependent  Abdominal   Peds  Hematology negative hematology ROS (+)   Anesthesia Other Findings Anterolisthesis  Reproductive/Obstetrics                            Anesthesia Physical Anesthesia Plan  ASA: IV  Anesthesia Plan: General   Post-op Pain Management:    Induction: Intravenous  PONV Risk Score and Plan: 2 and Ondansetron, Dexamethasone, Midazolam and Treatment may vary due to age or medical condition  Airway Management Planned: Oral ETT and Video Laryngoscope Planned  Additional Equipment:   Intra-op Plan:   Post-operative Plan: Extubation in OR and Possible Post-op intubation/ventilation  Informed Consent: I have reviewed the patients History and Physical, chart, labs and discussed the procedure including the risks, benefits and  alternatives for the proposed anesthesia with the patient or authorized representative who has indicated his/her understanding and acceptance.       Plan Discussed with: CRNA  Anesthesia Plan Comments:        Anesthesia Quick Evaluation

## 2020-12-18 NOTE — Op Note (Signed)
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  Postoperative diagnosis: Same  Procedure: Anterior cervical discectomy and fusion at C2-3 utilizing the globus titanium cage packed with locally harvested autograft mixed with vivigen and anterior cervical plating utilizing the globus extend plating system 16mm plate with 8-24 mm screws  Surgeon: Dominica Severin Crews Mccollam  Assistant: Nash Shearer  Anesthesia: General  EBL: Minimal  HPI: 60 year old female who previously undergone a decompressive cervical laminectomy at C2-3 for myelopathy and quadriparesis with cervical stenosis patient was doing very well recovered significant neurologic function however started experiencing worsening neck pain and on follow-up imaging showed C2-3 listhesis that is extended with motion on flexion-extension films.  In addition MRI scan showed central disc herniation causing some anterior spinal cord compression.  Due to her progression of clinical syndrome imaging findings I recommended ACDF at C2-3 I extensively over the risks and benefits of the operation with her as well as perioperative course expectations of outcome and alternatives of surgery and she understood and agreed to proceed forward.  Operative procedure: Patient was brought into the OR was [facility] general anesthesia positioned supine the neck in slight extension 5 pounds halter traction the right 7 neck was prepped and draped in routine sterile fashion preoperative x-ray localized the appropriate level so a curvilinear incision was made just off the midline to the anterior border of the sternocleidomastoid and superficial of platysma was dissected divided longitudinally.  The avascular plane between the strap muscles and and the carotid was developed down to the prevertebral fascia which was dissected away with Kitners.  Identified the C2-3 disc space with intraoperative x-ray then reflected the longus Coley  laterally and self-retaining retractor was placed.  Disc base was incised markedly disrupted this was the removal of posterior rongeurs BA upgoing curette scraped the endplates and under microscopic lamination disc base was further drilled down the posterior and the annulus and osteophytic complex.  Aggressive under biting both endplates remove the posterior osteophyte the posterior longitudinal ligament was also removed and several large subligamentous disc herniations were removed decompressing the central canal.  Identified the lateral margin of the disc base and sized up a 7 mm parallel cage this significantly opened up the disc base help to reduce her deformity.  Then after this was placed I selected initially a 14 mm plate but this was too small had to replace it with a 44mm plate I utilized rescue screws and C3 as well as use self drilling rescues in C2.  All screws ultimately had excellent purchase locking mechanisms were engaged.  Wounds copiously irrigated meticulous hemostasis was maintained platysma's were approximated with interrupted Vicryl and skin was closed running 4 subcuticular Dermabond benzoin Steri-Strips and sterile dressing was applied patient recovery room in stable condition.  At the end of the case all needle count sponge counts were correct.

## 2020-12-18 NOTE — Anesthesia Postprocedure Evaluation (Signed)
Anesthesia Post Note  Patient: Margaret Vaughn  Procedure(s) Performed: CERVICAL TWO-THREE ANTERIOR CERVICAL DISCECTOMY/DECOMPRESSION FUSION (N/A Spine Cervical)     Patient location during evaluation: PACU Anesthesia Type: General Level of consciousness: awake Pain management: pain level controlled Vital Signs Assessment: post-procedure vital signs reviewed and stable Respiratory status: spontaneous breathing, nonlabored ventilation, respiratory function stable and patient connected to nasal cannula oxygen Cardiovascular status: blood pressure returned to baseline and stable Postop Assessment: no apparent nausea or vomiting Anesthetic complications: no   No complications documented.  Last Vitals:  Vitals:   12/18/20 1615 12/18/20 1630  BP: (!) 146/86 (!) 129/97  Pulse: (!) 105 (!) 107  Resp: 13 (!) 7  Temp:    SpO2: 90% 91%    Last Pain:  Vitals:   12/18/20 1600  TempSrc:   PainSc: 4                  Sharmane Dame P Antonea Gaut

## 2020-12-18 NOTE — Progress Notes (Signed)
Orthopedic Tech Progress Note Patient Details:  Margaret Vaughn 10-Sep-1960 [id number] OR RN called requesting an [place]. Dropped off at desk. Patient ID: Margaret Vaughn, female   DOB: 03-26-1961, 60 y.o.   MRN: [id number]   Janit Pagan 12/18/2020, 1:17 PM

## 2020-12-19 DIAGNOSIS — M4712 Other spondylosis with myelopathy, cervical region: Secondary | ICD-10-CM | POA: Diagnosis not present

## 2020-12-19 LAB — GLUCOSE, CAPILLARY: Glucose-Capillary: 172 mg/dL — ABNORMAL HIGH (ref 70–99)

## 2020-12-19 MED ORDER — OXYCODONE-ACETAMINOPHEN 5-325 MG PO TABS: 1.0000 | ORAL_TABLET | ORAL | 0 refills | Status: DC | PRN

## 2020-12-19 MED ORDER — METHOCARBAMOL 500 MG PO TABS: 500.0000 mg | ORAL_TABLET | Freq: Four times a day (QID) | ORAL | 0 refills | Status: DC

## 2020-12-19 NOTE — Progress Notes (Signed)
Patients blood sugar checked at 21:05 and cbg was 239; pt had just finished eating a late dinner due to surgery today.  No coverage was given at this time, per pt.  CBG was rechecked at 1:04 and cbg now 172.  Will continue to monitor.

## 2020-12-19 NOTE — Evaluation (Signed)
Physical Therapy Evaluation Patient Details Name: Margaret Vaughn MRN: [id number] DOB: [birth year] Today's Date: 12/19/2020   History of Present Illness  Pt is a 60 y/o female s/p ACDF C2-3. Pt with recent ED admission for quadraparesis and underwent cervical decompression (Jan). PMH including asthma, COPD, depression with anxiety, DM type ll, HTN, narcolepsy, syringomyelia, and cervical fusion (08/07/2020).    Clinical Impression  Pt admitted with above diagnosis. At the time of PT eval, pt was able to demonstrate transfers and ambulation with gross min guard assist and no AD. Pt reporting she is ambulating better now than she was prior to surgery. Pt not feeling the need for an AD because of this, however would recommend UE support to decrease risk for falls. Pt declined. Pt was educated on precautions, brace application/wearing schedule, appropriate activity progression, and car transfer. Pt currently with functional limitations due to the deficits listed below (see PT Problem List). Pt will benefit from skilled PT to increase their independence and safety with mobility to allow discharge to the venue listed below.     Follow Up Recommendations Outpatient PT;Supervision for mobility/OOB (Discussed recommendation for HHPT at d/c. Pt declined, stating she does not allow people into her home. Recommending outpatient PT when MD feels it is appropriate per post-op protocol.)    Equipment Recommendations  None recommended by PT    Recommendations for Other Services       Precautions / Restrictions Precautions Precautions: Cervical Precaution Booklet Issued: Yes (comment) Precaution Comments: Reviewed cervical precautions and compensatory techniques for ADLs Required Braces or Orthoses: Cervical Brace Cervical Brace: Hard collar;Other (comment) (Off for shower) Restrictions Weight Bearing Restrictions: No      Mobility  Bed Mobility Overal bed mobility: Needs Assistance Bed Mobility:  Supine to Sit     Supine to sit: Supervision;HOB elevated     General bed mobility comments: Pt was received sitting up EOB.    Transfers Overall transfer level: Needs assistance Equipment used: None Transfers: Sit to/from Stand Sit to Stand: Min guard         General transfer comment: Close guard for safety as pt powered up to full stand. No assist required however pt appeared mildly unsteady.  Ambulation/Gait Ambulation/Gait assistance: Min guard Gait Distance (Feet): 250 Feet Assistive device: None Gait Pattern/deviations: Step-through pattern;Decreased stride length;Ataxic;Antalgic;Trunk flexed Gait velocity: Decreased Gait velocity interpretation: <1.31 ft/sec, indicative of household ambulator General Gait Details: Pt unsteady with mild ataxia noted. VC's for optimal safety awareness and hands on guarding provided for safety.  Stairs         General stair comments: Pt declined stair training.  Wheelchair Mobility    Modified Rankin (Stroke Patients Only)       Balance Overall balance assessment: Needs assistance Sitting-balance support: Feet supported;No upper extremity supported Sitting balance-Leahy Scale: Fair     Standing balance support: No upper extremity supported;During functional activity Standing balance-Leahy Scale: Poor Standing balance comment: Occasionally unsteady. Would likely not be able to tolerate any balance challenges.                             Pertinent Vitals/Pain Pain Assessment: Faces Faces Pain Scale: Hurts even more Pain Location: Neck/throat with swallowing Pain Descriptors / Indicators: Sore    Home Living Family/patient expects to be discharged to:: Private residence Living Arrangements: Spouse/significant other;Children Available Help at Discharge: Family;Available PRN/intermittently Type of Home: Mobile home Home Access: Stairs to enter Entrance Stairs-Rails: None  Entrance Stairs-Number of Steps: Pt  reports no stairs - threshold stepm only, however per chart review pt has several stairs Home Layout: One level Home Equipment: Walker - 4 wheels;Bedside commode;Shower seat;Hand held shower head;Wheelchair - manual Additional Comments: 3 dogs 1 cat    Prior Function Level of Independence: Needs assistance   Gait / Transfers Assistance Needed: Was using DME for mobility; only short distances. Since sx in Jan  ADL's / Homemaking Assistance Needed: Husband has been helping with bathing and dressing since sx in Jan        Hand Dominance   Dominant Hand: Right    Extremity/Trunk Assessment   Upper Extremity Assessment Upper Extremity Assessment: Defer to OT evaluation    Lower Extremity Assessment Lower Extremity Assessment: Generalized weakness    Cervical / Trunk Assessment Cervical / Trunk Assessment: Other exceptions Cervical / Trunk Exceptions: s/p ACDF  Communication   Communication: No difficulties  Cognition Arousal/Alertness: Awake/alert Behavior During Therapy: WFL for tasks assessed/performed Overall Cognitive Status: Within Functional Limits for tasks assessed                                 General Comments: Feel pt is at cognitive baseline      General Comments General comments (skin integrity, edema, etc.): Instructed pt in incentive spirometer use.    Exercises     Assessment/Plan    PT Assessment Patient needs continued PT services  PT Problem List Decreased strength;Decreased range of motion;Decreased activity tolerance;Decreased balance;Decreased mobility;Decreased knowledge of use of DME;Decreased safety awareness;Decreased knowledge of precautions;Pain       PT Treatment Interventions DME instruction;Gait training;Functional mobility training;Therapeutic activities;Therapeutic exercise;Balance training;Neuromuscular re-education;Cognitive remediation;Patient/family education    PT Goals (Current goals can be found in the Care Plan  section)  Acute Rehab PT Goals Patient Stated Goal: Go home PT Goal Formulation: With patient/family Time For Goal Achievement: 12/26/20 Potential to Achieve Goals: Good    Frequency Min 5X/week   Barriers to discharge        Co-evaluation               AM-PAC PT "6 Clicks" Mobility  Outcome Measure Help needed turning from your back to your side while in a flat bed without using bedrails?: None Help needed moving from lying on your back to sitting on the side of a flat bed without using bedrails?: A Little Help needed moving to and from a bed to a chair (including a wheelchair)?: A Little Help needed standing up from a chair using your arms (e.g., wheelchair or bedside chair)?: A Little Help needed to walk in hospital room?: A Little Help needed climbing 3-5 steps with a railing? : A Little 6 Click Score: 19    End of Session Equipment Utilized During Treatment: Cervical collar Activity Tolerance: Patient tolerated treatment well Patient left: with call bell/phone within reach;with family/visitor present (Pt sitting EOB at end of session) Nurse Communication: Mobility status PT Visit Diagnosis: Unsteadiness on feet (R26.81);Pain Pain - part of body:  (neck)    Time: 4782-9562 PT Time Calculation (min) (ACUTE ONLY): 20 min   Charges:   PT Evaluation $PT Eval Low Complexity: 1 Low          Rolinda Roan, PT, DPT Acute Rehabilitation Services Pager: [phone] Office: [phone]   Thelma Comp 12/19/2020, 10:00 AM

## 2020-12-19 NOTE — Plan of Care (Signed)
Adequately Ready for discharge

## 2020-12-19 NOTE — Evaluation (Signed)
Occupational Therapy Evaluation Patient Details Name: Margaret Vaughn MRN: [id number] DOB: 05/19/1961 Today's Date: 12/19/2020    History of Present Illness 59 yo female s/p ACDF C2-3. Pt with recent ED admission for quadraparesis and underwent cervical decompression (Jan). PMH including asthma, COPD, depression with anxiety, DM type ll, HTN, narcolepsy, syringomyelia, and cervical fusion (08/07/2020).    Clinical Impression   PTA, pt was living with her husband and son and required assistance for ADLs and using DME for short distance mobility. Currently, pt requires Supervision-Min A for UB ADLs, Min Guard A for LB ADLs, and Supervision-Min Guard A for functional mobility. Provided education and handout on cervical precautions, bed mobility, grooming, UB ADLs, LB ADLs, toileting, and tub transfer; pt demonstrated and verbalized understanding. Educating husband on collar management. Answered all pt questions. Recommend dc home once medically stable per physician. All acute OT needs met and will sign off. Thank you.    Follow Up Recommendations  No OT follow up;Supervision - Intermittent    Equipment Recommendations  None recommended by OT    Recommendations for Other Services PT consult     Precautions / Restrictions Precautions Precautions: Cervical Precaution Booklet Issued: Yes (comment) Precaution Comments: Reviewed cervical precautions and compensatory techniques for ADLs Required Braces or Orthoses: Cervical Brace Cervical Brace: Hard collar;Other (comment) (Off for shower)      Mobility Bed Mobility Overal bed mobility: Needs Assistance Bed Mobility: Supine to Sit     Supine to sit: Supervision;HOB elevated     General bed mobility comments: Pt impulsively sitting upright when she started to cough and then leaning forward to cough up mucus. Educating pt on safety and importance of log roll techniques. Pt verbalized udnerstanding    Transfers Overall transfer level:  Needs assistance Equipment used: None Transfers: Sit to/from Stand Sit to Stand: Min guard         General transfer comment: MIn Guard A for initial safety. Reaching Supervision level    Balance Overall balance assessment: Mild deficits observed, not formally tested                                         ADL either performed or assessed with clinical judgement   ADL Overall ADL's : Needs assistance/impaired Eating/Feeding: Set up;Sitting   Grooming: Set up;Sitting Grooming Details (indicate cue type and reason): Educating pt on compensatory techniques for oral care Upper Body Bathing: Minimal assistance;Sitting   Lower Body Bathing: Min guard;Sit to/from stand   Upper Body Dressing : Supervision/safety;Sitting;Minimal assistance Upper Body Dressing Details (indicate cue type and reason): Supervision for donning shirt; cues to don neck first. Assistance for managing collar. Educating husband on collar management and cleaning. Lower Body Dressing: Min guard;Sit to/from stand Lower Body Dressing Details (indicate cue type and reason): Pt able to don underwear and pants with Min guard A for safety in standing. Toilet Transfer: Supervision/safety;BSC;Ambulation Toilet Transfer Details (indicate cue type and reason): Supervision for safety     Tub/ Shower Transfer: Tub transfer;Min guard;Ambulation   Functional mobility during ADLs: Min guard General ADL Comments: Providing education on cervical precautions, bed mobility, collar management, UB ADLs, LB ADLs, grooming, toileting, and tub transfer. Pt performing at Supervision-Min A level     Vision         Perception     Praxis      Pertinent Vitals/Pain         Hand Dominance Right   Extremity/Trunk Assessment Upper Extremity Assessment Upper Extremity Assessment: Generalized weakness   Lower Extremity Assessment Lower Extremity Assessment: Defer to PT evaluation   Cervical / Trunk  Assessment Cervical / Trunk Assessment: Other exceptions Cervical / Trunk Exceptions: s/p ACDF   Communication Communication Communication: No difficulties   Cognition Arousal/Alertness: Awake/alert Behavior During Therapy: WFL for tasks assessed/performed (Upon arrival, pt frustrated about pain medication not being filled) Overall Cognitive Status: Within Functional Limits for tasks assessed                                 General Comments: Feel pt is at cognitive baseline   General Comments  Husband present during session    Exercises     Shoulder Instructions      Home Living Family/patient expects to be discharged to:: Private residence Living Arrangements: Spouse/significant other;Children Available Help at Discharge: Family;Available PRN/intermittently Type of Home: Mobile home Home Access: Stairs to enter     Home Layout: One level     Bathroom Shower/Tub: Tub/shower unit   Bathroom Toilet: Standard     Home Equipment: Walker - 4 wheels;Bedside commode;Shower seat;Hand held shower head;Wheelchair - manual          Prior Functioning/Environment Level of Independence: Needs assistance  Gait / Transfers Assistance Needed: Was using DME for mobility; only short distances. Since sx in Jan ADL's / Homemaking Assistance Needed: Husband has been helping with bathing and dressing since sx in Jan            OT Problem List: Decreased strength;Decreased activity tolerance;Impaired balance (sitting and/or standing);Decreased safety awareness;Decreased knowledge of use of DME or AE;Decreased knowledge of precautions;Pain      OT Treatment/Interventions:      OT Goals(Current goals can be found in the care plan section) Acute Rehab OT Goals Patient Stated Goal: Go home OT Goal Formulation: All assessment and education complete, DC therapy  OT Frequency:     Barriers to D/C:            Co-evaluation              AM-PAC OT "6 Clicks"  Daily Activity     Outcome Measure Help from another person eating meals?: A Little Help from another person taking care of personal grooming?: A Little Help from another person toileting, which includes using toliet, bedpan, or urinal?: A Little Help from another person bathing (including washing, rinsing, drying)?: A Little Help from another person to put on and taking off regular upper body clothing?: A Little Help from another person to put on and taking off regular lower body clothing?: A Little 6 Click Score: 18   End of Session Equipment Utilized During Treatment: Gait belt;Cervical collar Nurse Communication: Mobility status  Activity Tolerance: Patient tolerated treatment well Patient left: in chair;with call bell/phone within reach;with family/visitor present  OT Visit Diagnosis: Unsteadiness on feet (R26.81);Other abnormalities of gait and mobility (R26.89);Muscle weakness (generalized) (M62.81);Pain Pain - part of body:  (Neck)                Time: 0828-0857 OT Time Calculation (min): 29 min Charges:  OT General Charges $OT Visit: 1 Visit OT Evaluation $OT Eval Moderate Complexity: 1 Mod OT Treatments $Self Care/Home Management : 8-22 mins    MSOT, OTR/L Acute Rehab Pager: [phone] Office: [phone]   M  12/19/2020, 9:20 AM 

## 2020-12-19 NOTE — Progress Notes (Signed)
Patient is discharged from room 3C08 at this time. Alert and in stable condition. IV site d/c'd and instructions read to patient with understanding verbalized and all questions answered. Left unit via wheelchair with all belongings at side. 

## 2020-12-19 NOTE — Progress Notes (Signed)
Called RT for pt due to request for nebulizer "breathing treatment."  RT came to unit but pt changed her mind about breathing treatment at this time.  Pt was given her Albuterol inhaler at her request and says, "I have gotten some relief from that."   Will continue to monitor.

## 2020-12-19 NOTE — Discharge Summary (Signed)
Physician Discharge Summary  Patient ID: Margaret Vaughn MRN: [id number] DOB/AGE: [birth year] 60 y.o.  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 The patient tolerated the procedure well and was taken to the recovery room and then to the floor in stable condition. The hospital course was routine. There were no complications. The wound remained clean dry and intact. Pt had appropriate neck soreness. No complaints of arm pain or new N/T/W. The patient remained afebrile with stable vital signs, and tolerated a regular diet. The patient continued to increase activities, and pain was well controlled with oral pain medications.   Consults: None  Significant Diagnostic Studies:  Results for orders placed or performed during the hospital encounter of 12/18/20  Glucose, capillary  Result Value Ref Range   Glucose-Capillary 183 (H) 70 - 99 mg/dL  Glucose, capillary  Result Value Ref Range   Glucose-Capillary 170 (H) 70 - 99 mg/dL  ABO/Rh  Result Value Ref Range   ABO/RH(D)      A POS Performed at [facility] [address], [place], [place] [address]     DG Cervical Spine 1 View  Result Date: 12/18/2020 CLINICAL DATA:  C2-C3 anterior cervical discectomy and decompression. EXAM: DG CERVICAL SPINE - 1 VIEW; DG C-ARM 1-60 MIN COMPARISON:  Preoperative CT FINDINGS: Single lateral fluoroscopic spot view of the cervical spine obtained in the operating room. Anterior fusion C2-C3 with interbody spacer in place. Fluoroscopy time 11 seconds. Dose 1.42 mGy. IMPRESSION: Intraoperative fluoroscopy during C2-C3 ACDF. Electronically Signed   By: Keith Rake M.D.   On: 12/18/2020 14:54   CT  CERVICAL SPINE WO CONTRAST  Result Date: 12/18/2020 CLINICAL DATA:  Neck pain.  Preop for fusion. EXAM: CT CERVICAL SPINE WITHOUT CONTRAST TECHNIQUE: Multidetector CT imaging of the cervical spine was performed without intravenous contrast. Multiplanar CT image reconstructions were also generated. COMPARISON:  12/09/2020 FINDINGS: Alignment: Generalized reversal of cervical lordosis with C2-3 anterolisthesis measuring 4 mm. Skull base and vertebrae: C2-C4 laminectomy. Soft tissues and spinal canal: Granulation tissue appearance at the level of laminectomies. Retropharyngeal course of the carotids. Disc levels: C2-3: Facet osteoarthritis with spurring and joint erosions greater on the right. There is disc narrowing and anterolisthesis. Spinal stenosis with cord impingement by MRI. C3-4: Degenerative facet spurring on the right more than left. Disc space narrowing. Mild to moderate bilateral foraminal narrowing. Patent appearance of the thecal sac C4-5: Degenerative facet spurring with ankylosis on the left. Disc space narrowing with ridging. Mild right and at least moderate left foraminal stenosis C5-6: Disc narrowing with bridging ventral spurring. Uncovertebral ridging asymmetric to the right where there is high-grade foraminal impingement. C6-7: Disc narrowing and ventral spondylitic spurring. Negative facets C7-T1:Disc narrowing and ventral spondylitic spurring. Upper chest: Negative IMPRESSION: 1. No new or acute finding when compared to prior MRI. 2. C2-3 facet arthritis with erosive changes on the right and 4 mm of anterolisthesis. 3. Facet ankylosis on the left at C4-5. Bridging intervertebral osteophytes at C5-6 and C6-7. 4. Notable foraminal narrowing on the right at C5-6 and left at C4-5. 5. Retropharyngeal course of the carotids. Electronically Signed   By: Monte Fantasia M.D.   On: 12/18/2020 09:26   MR CERVICAL SPINE WO CONTRAST  Result Date: 12/10/2020 CLINICAL DATA:  Neck pain. History of  posterior cervical decompression on 08/08/2020 EXAM: MRI CERVICAL SPINE WITHOUT CONTRAST TECHNIQUE: Multiplanar, multisequence MR imaging of the cervical spine was performed. No intravenous contrast was administered. COMPARISON:  MRI 08/07/2020 FINDINGS: Alignment: New 3 mm anterolisthesis of C2 on C3. Reversal of the normal cervical lordosis. Vertebrae: Discogenic endplate marrow changes at C2-3. There is also a reactive marrow edema centered at the bilateral C2-3 facet joints. No fracture, evidence of discitis, or suspicious bone lesion. Cord: Redemonstrated T2/STIR hyperintense signal within the cord at the C2-3 level, improved compared to prior. No syrinx. No new cord signal abnormality. Posterior Fossa, vertebral arteries, paraspinal tissues: Postoperative changes within the posterior paraspinal soft tissues. No postoperative fluid collection. No acute findings within the visualized posterior fossa. Vertebral artery flow voids are intact. Disc levels: C2-C3: Interval posterior decompression. New grade 1 anterolisthesis with focal disc protrusion, slightly eccentric to the right (series 8, image 11). The degree of canal stenosis has improved compared to the previous MRI, although is still severe. At least moderate bilateral foraminal stenosis, right worse than left. C3-C4: Interval posterior decompression. Minimal disc bulge. Minimal or mild residual canal stenosis, improved from prior. Mild bilateral foraminal stenosis, also improved. C4-C5: Minimal disc osteophyte complex with right greater than left uncovertebral arthropathy resulting in mild-to-moderate foraminal stenosis, right worse than left. No significant canal stenosis. Unchanged. C5-C6: Small left paracentral disc protrusion contacting the left hemicord resulting in mild canal stenosis. Mild-to-moderate right-sided foraminal stenosis. Unchanged. C6-C7: Mild disc osteophyte complex without significant foraminal or canal stenosis. Unchanged. C7-T1:  Minimal disc osteophyte complex without significant foraminal or canal stenosis. Unchanged. IMPRESSION: 1. Interval posterior decompression at C2-3 and C3-4. 2. New grade 1 anterolisthesis of C2 on C3 with focal disc protrusion. The degree of canal stenosis has improved compared to the previous MRI, although is still severe. At least moderate bilateral foraminal stenosis, right worse than left. 3. Redemonstrated increased signal within the cord at the C2-3 level compatible with compressive myelopathy, slightly improved compared to prior. 4. Stable mild canal stenosis at C5-6 with mild-to-moderate right-sided foraminal stenosis. Electronically Signed   By: Davina Poke D.O.   On: 12/10/2020 16:30   DG C-Arm 1-60 Min  Result Date: 12/18/2020 CLINICAL DATA:  C2-C3 anterior cervical discectomy and decompression. EXAM: DG CERVICAL SPINE - 1 VIEW; DG C-ARM 1-60 MIN COMPARISON:  Preoperative CT FINDINGS: Single lateral fluoroscopic spot view of the cervical spine obtained in the operating room. Anterior fusion C2-C3 with interbody spacer in place. Fluoroscopy time 11 seconds. Dose 1.42 mGy. IMPRESSION: Intraoperative fluoroscopy during C2-C3 ACDF. Electronically Signed   By: Keith Rake M.D.   On: 12/18/2020 14:54    Antibiotics:  Anti-infectives (From admission, onward)   Start     Dose/Rate Route Frequency Ordered Stop   12/19/20 1000  nitrofurantoin (MACRODANTIN) capsule 100 mg        100 mg Oral Daily 12/18/20 1837     12/18/20 2100  ceFAZolin (ANCEF) IVPB 2g/100 mL premix        2 g 200 mL/hr over 30 Minutes Intravenous Every 8 hours 12/18/20 1822 12/19/20 0543   12/18/20 0830  ceFAZolin (ANCEF) IVPB 2g/100 mL premix        2 g 200 mL/hr over 30 Minutes Intravenous On call to O.R. 12/18/20 0815 12/18/20 1253      Discharge Exam: Blood pressure 114/81, pulse (!) 105, temperature 98.6 F (37 C), temperature source Oral, resp. rate 18,  height 5\' 4"  (1.626 m), weight 65.8 kg, SpO2 93  %. Neurologic: Grossly normal Ambulating and voiding well, incision cdi   Discharge Medications:   Allergies as of 12/19/2020      Reactions   Iohexol Anaphylaxis   PT.GIVEN OMNI 300 FOR CT ANGIO WAS GIVEN 50 MG.BENADRYL PRIOR TO SCAN HAD NO PROBLEMS   Moxifloxacin Anaphylaxis   Shellfish Allergy Hives      Medication List    TAKE these medications   albuterol 108 (90 Base) MCG/ACT inhaler Commonly known as: VENTOLIN HFA TAKE 2 PUFFS BY MOUTH EVERY 6 HOURS AS NEEDED FOR WHEEZE OR SHORTNESS OF BREATH What changed: See the new instructions.   atorvastatin 40 MG tablet Commonly known as: LIPITOR Take 1 tablet (40 mg total) by mouth daily.   baclofen 10 MG tablet Commonly known as: LIORESAL Take 1 tablet (10 mg total) by mouth 3 (three) times daily. What changed:   when to take this  reasons to take this   cholecalciferol 25 MCG (1000 UNIT) tablet Commonly known as: VITAMIN D3 Take 1,000 Units by mouth daily.   cyclobenzaprine 10 MG tablet Commonly known as: FLEXERIL Take 10 mg by mouth 3 (three) times daily as needed for muscle spasms.   DULoxetine 30 MG capsule Commonly known as: Cymbalta Take 1 capsule (30 mg total) by mouth daily.   enalapril 5 MG tablet Commonly known as: VASOTEC Take 1 tablet (5 mg total) by mouth daily.   Fish Oil 1000 MG Caps Take 4 capsules by mouth daily.   guaiFENesin 600 MG 12 hr tablet Commonly known as: MUCINEX Take 1,200 mg by mouth 2 (two) times daily.   HAIR SKIN NAILS PO Take 3 tablets by mouth daily.   multivitamin with minerals tablet Take 1 tablet by mouth daily.   Janumet 50-1000 MG tablet Generic drug: sitaGLIPtin-metformin Take 1 tablet by mouth 2 (two) times daily with a meal.   methocarbamol 500 MG tablet Commonly known as: Robaxin Take 1 tablet (500 mg total) by mouth 4 (four) times daily.   nicotine 21 mg/24hr patch Commonly known as: NICODERM CQ - dosed in mg/24 hours Place 21 mg onto the skin daily.    nitrofurantoin 100 MG capsule Commonly known as: MACRODANTIN Take 100 mg by mouth daily.   oxyCODONE-acetaminophen 10-325 MG tablet Commonly known as: PERCOCET Take 1 tablet by mouth 4 (four) times daily as needed for pain. What changed: Another medication with the same name was added. Make sure you understand how and when to take each.   oxyCODONE-acetaminophen 5-325 MG tablet Commonly known as: Percocet Take 1 tablet by mouth every 4 (four) hours as needed for severe pain. What changed: You were already taking a medication with the same name, and this prescription was added. Make sure you understand how and when to take each.   pregabalin 150 MG capsule Commonly known as: LYRICA Take 1 capsule (150 mg total) by mouth 3 (three) times daily.   rizatriptan 10 MG disintegrating tablet Commonly known as: MAXALT-MLT Take 1 tablet (10 mg total) by mouth as needed for migraine. May repeat in 2 hours if needed   Spiriva HandiHaler 18 MCG inhalation capsule Generic drug: tiotropium INHALE CONTENTS OF 1 CAPSULE VIA HANDIHALER ONCE DAILY AT THE SAME TIME EVERY DAY What changed: See the new instructions.   Wixela Inhub 100-50 MCG/ACT Aepb Generic drug: fluticasone-salmeterol INHALE 1 PUFF INTO THE LUNGS TWICE A DAY What changed: See the new instructions.   Xtampza ER 13.5  MG C12a Generic drug: oxyCODONE ER Take 13.5 mg by mouth 2 (two) times daily.       Disposition: home   Final Dx: acdf c2-3  Discharge Instructions     Remove dressing in 72 hours   Complete by: As directed    Call MD for:  difficulty breathing, headache or visual disturbances   Complete by: As directed    Call MD for:  hives   Complete by: As directed    Call MD for:  persistant nausea and vomiting   Complete by: As directed    Call MD for:  redness, tenderness, or signs of infection (pain, swelling, redness, odor or green/yellow discharge around incision site)   Complete by: As directed    Call MD for:   severe uncontrolled pain   Complete by: As directed    Call MD for:  temperature >100.4   Complete by: As directed    Diet - low sodium heart healthy   Complete by: As directed    Driving Restrictions   Complete by: As directed    No driving for 2 weeks, no riding in the car for 1 week   Increase activity slowly   Complete by: As directed    Lifting restrictions   Complete by: As directed    No lifting more than 8 lbs         Signed: Ocie Cornfield Menaal Russum 12/19/2020, 8:13 AM

## 2020-12-19 NOTE — Discharge Instructions (Signed)

## 2020-12-23 ENCOUNTER — Encounter (HOSPITAL_COMMUNITY): Payer: Self-pay | Admitting: Neurosurgery

## 2020-12-24 ENCOUNTER — Other Ambulatory Visit: Payer: Self-pay | Admitting: Family Medicine

## 2020-12-25 DIAGNOSIS — R0902 Hypoxemia: Secondary | ICD-10-CM | POA: Diagnosis not present

## 2020-12-31 DIAGNOSIS — G959 Disease of spinal cord, unspecified: Secondary | ICD-10-CM | POA: Diagnosis not present

## 2020-12-31 DIAGNOSIS — I10 Essential (primary) hypertension: Secondary | ICD-10-CM | POA: Diagnosis not present

## 2021-01-03 DIAGNOSIS — Z79899 Other long term (current) drug therapy: Secondary | ICD-10-CM | POA: Diagnosis not present

## 2021-01-03 DIAGNOSIS — G894 Chronic pain syndrome: Secondary | ICD-10-CM | POA: Diagnosis not present

## 2021-01-03 DIAGNOSIS — Z9189 Other specified personal risk factors, not elsewhere classified: Secondary | ICD-10-CM | POA: Diagnosis not present

## 2021-01-03 DIAGNOSIS — M4802 Spinal stenosis, cervical region: Secondary | ICD-10-CM | POA: Diagnosis not present

## 2021-01-06 ENCOUNTER — Other Ambulatory Visit: Payer: Self-pay | Admitting: Family Medicine

## 2021-01-06 DIAGNOSIS — J441 Chronic obstructive pulmonary disease with (acute) exacerbation: Secondary | ICD-10-CM

## 2021-01-10 DIAGNOSIS — Z981 Arthrodesis status: Secondary | ICD-10-CM | POA: Diagnosis not present

## 2021-01-10 DIAGNOSIS — S12200A Unspecified displaced fracture of third cervical vertebra, initial encounter for closed fracture: Secondary | ICD-10-CM | POA: Diagnosis not present

## 2021-01-10 DIAGNOSIS — M4312 Spondylolisthesis, cervical region: Secondary | ICD-10-CM | POA: Diagnosis not present

## 2021-01-10 DIAGNOSIS — T84216A Breakdown (mechanical) of internal fixation device of vertebrae, initial encounter: Secondary | ICD-10-CM | POA: Diagnosis not present

## 2021-01-10 DIAGNOSIS — M4802 Spinal stenosis, cervical region: Secondary | ICD-10-CM | POA: Diagnosis not present

## 2021-01-10 DIAGNOSIS — M47812 Spondylosis without myelopathy or radiculopathy, cervical region: Secondary | ICD-10-CM | POA: Diagnosis not present

## 2021-01-16 DIAGNOSIS — R1310 Dysphagia, unspecified: Secondary | ICD-10-CM | POA: Diagnosis not present

## 2021-01-17 ENCOUNTER — Other Ambulatory Visit (HOSPITAL_COMMUNITY): Payer: Self-pay | Admitting: *Deleted

## 2021-01-17 DIAGNOSIS — R1310 Dysphagia, unspecified: Secondary | ICD-10-CM

## 2021-01-18 ENCOUNTER — Other Ambulatory Visit: Payer: Self-pay | Admitting: Family Medicine

## 2021-01-18 DIAGNOSIS — J441 Chronic obstructive pulmonary disease with (acute) exacerbation: Secondary | ICD-10-CM

## 2021-01-18 DIAGNOSIS — K219 Gastro-esophageal reflux disease without esophagitis: Secondary | ICD-10-CM

## 2021-01-22 ENCOUNTER — Other Ambulatory Visit: Payer: Self-pay | Admitting: Family Medicine

## 2021-01-22 DIAGNOSIS — F411 Generalized anxiety disorder: Secondary | ICD-10-CM

## 2021-01-22 DIAGNOSIS — G629 Polyneuropathy, unspecified: Secondary | ICD-10-CM

## 2021-01-23 ENCOUNTER — Ambulatory Visit (HOSPITAL_COMMUNITY)
Admission: RE | Admit: 2021-01-23 | Discharge: 2021-01-23 | Disposition: A | Discharge status: Home / Self Care | Payer: Medicaid Other | Source: Ambulatory Visit | Attending: Student | Admitting: Student

## 2021-01-23 ENCOUNTER — Ambulatory Visit (HOSPITAL_COMMUNITY)
Admission: RE | Admit: 2021-01-23 | Discharge: 2021-01-23 | Disposition: A | Discharge status: Home / Self Care | Payer: Medicaid Other | Source: Ambulatory Visit | Attending: Family Medicine | Admitting: Family Medicine

## 2021-01-23 ENCOUNTER — Other Ambulatory Visit: Payer: Self-pay

## 2021-01-23 DIAGNOSIS — R1310 Dysphagia, unspecified: Secondary | ICD-10-CM | POA: Diagnosis not present

## 2021-01-24 DIAGNOSIS — R0902 Hypoxemia: Secondary | ICD-10-CM | POA: Diagnosis not present

## 2021-01-30 DIAGNOSIS — M4802 Spinal stenosis, cervical region: Secondary | ICD-10-CM | POA: Diagnosis not present

## 2021-01-30 DIAGNOSIS — G894 Chronic pain syndrome: Secondary | ICD-10-CM | POA: Diagnosis not present

## 2021-01-30 DIAGNOSIS — Z9189 Other specified personal risk factors, not elsewhere classified: Secondary | ICD-10-CM | POA: Diagnosis not present

## 2021-01-30 DIAGNOSIS — Z79899 Other long term (current) drug therapy: Secondary | ICD-10-CM | POA: Diagnosis not present

## 2021-02-11 ENCOUNTER — Other Ambulatory Visit: Payer: Self-pay | Admitting: Neurosurgery

## 2021-02-18 DIAGNOSIS — M542 Cervicalgia: Secondary | ICD-10-CM | POA: Diagnosis not present

## 2021-02-19 ENCOUNTER — Other Ambulatory Visit: Payer: Self-pay

## 2021-02-19 ENCOUNTER — Telehealth: Payer: Self-pay | Admitting: Family Medicine

## 2021-02-19 DIAGNOSIS — E1149 Type 2 diabetes mellitus with other diabetic neurological complication: Secondary | ICD-10-CM

## 2021-02-19 DIAGNOSIS — E782 Mixed hyperlipidemia: Secondary | ICD-10-CM

## 2021-02-19 DIAGNOSIS — I10 Essential (primary) hypertension: Secondary | ICD-10-CM

## 2021-02-19 NOTE — Telephone Encounter (Signed)
Future lab orders have been placed. Please offer an appt for pt to come in the office but if not able can do a virtual on a same day.  Left message for husband to return call

## 2021-02-22 ENCOUNTER — Other Ambulatory Visit: Payer: Self-pay | Admitting: Family Medicine

## 2021-02-22 DIAGNOSIS — F411 Generalized anxiety disorder: Secondary | ICD-10-CM

## 2021-02-22 DIAGNOSIS — G629 Polyneuropathy, unspecified: Secondary | ICD-10-CM

## 2021-02-24 DIAGNOSIS — R0902 Hypoxemia: Secondary | ICD-10-CM | POA: Diagnosis not present

## 2021-02-28 NOTE — Progress Notes (Signed)
Surgical Instructions    Your procedure is scheduled on Monday August 15.  Report to [facility] Main Entrance "A" at 5:30 A.M., then check in with the Admitting office.  Call this number if you have problems the morning of surgery:  [phone]   If you have any questions prior to your surgery date call [phone]: Open Monday-Friday 8am-4pm    Remember:  Do not eat or drink anything after midnight the night before your surgery    Take these medicines the morning of surgery with A SIP OF WATER:              atorvastatin (LIPITOR)              baclofen (LIORESAL) if needed             cyclobenzaprine (FLEXERIL)             DULoxetine (CYMBALTA)              guaiFENesin (MUCINEX)              nitrofurantoin (MACRODANTIN)             oxyCODONE-acetaminophen (PERCOCET) 10-325 as needed             pregabalin (LYRICA)              PROAIR HFA 108 (90 Base) MCG/ACT inhaler if needed             tiotropium (SPIRIVA HANDIHALER)             WIXELA INHUB              XTAMPZA ER  As of today, STOP taking any Aspirin (unless otherwise instructed by your surgeon) Aleve, Naproxen, Ibuprofen, Motrin, Advil, Goody's, BC's, all herbal medications, fish oil, and all vitamins.   WHAT DO I DO ABOUT MY DIABETES MEDICATION?  Do not take oral diabetes medicines (pills) the morning of surgery.       DO NOT take sitaGLIPtin-metformin (JANUMET) the morning of surgery   HOW TO MANAGE YOUR DIABETES BEFORE AND AFTER SURGERY  Why is it important to control my blood sugar before and after surgery? Improving blood sugar levels before and after surgery helps healing and can limit problems. A way of improving blood sugar control is eating a healthy diet by:  Eating less sugar and carbohydrates  Increasing activity/exercise  Talking with your doctor about reaching your blood sugar goals High blood sugars (greater than 180 mg/dL) can raise your risk of infections and slow your recovery, so you will need  to focus on controlling your diabetes during the weeks before surgery. Make sure that the doctor who takes care of your diabetes knows about your planned surgery including the date and location.  How do I manage my blood sugar before surgery? Check your blood sugar at least 4 times a day, starting 2 days before surgery, to make sure that the level is not too high or low.  Check your blood sugar the morning of your surgery when you wake up and every 2 hours until you get to the Short Stay unit.  If your blood sugar is less than 70 mg/dL, you will need to treat for low blood sugar: Do not take insulin. Treat a low blood sugar (less than 70 mg/dL) with  cup of clear juice (cranberry or apple), 4 glucose tablets, OR glucose gel. Recheck blood sugar in 15 minutes after treatment (to make sure it is greater than 70  mg/dL). If your blood sugar is not greater than 70 mg/dL on recheck, call [phone] for further instructions. Report your blood sugar to the short stay nurse when you get to Short Stay.  If you are admitted to the hospital after surgery: Your blood sugar will be checked by the staff and you will probably be given insulin after surgery (instead of oral diabetes medicines) to make sure you have good blood sugar levels. The goal for blood sugar control after surgery is 80-180 mg/dL.           Do not wear jewelry or makeup Do not wear lotions, powders, perfumes/colognes, or deodorant. Do not shave 48 hours prior to surgery.  Men may shave face and neck. Do not bring valuables to the hospital. DO Not wear nail polish, gel polish, artificial nails, or any other type of covering on  natural nails including finger and toenails. If patients have artificial nails, gel coating, etc. that need to be removed by a nail salon please have this removed prior to surgery or surgery may need to be canceled/delayed if the surgeon/ anesthesia feels like the patient is unable to be adequately monitored.              [facility] is not responsible for any belongings or valuables.  Do NOT Smoke (Tobacco/Vaping) or drink Alcohol 24 hours prior to your procedure If you use a CPAP at night, you may bring all equipment for your overnight stay.   Contacts, glasses, dentures or bridgework may not be worn into surgery, please bring cases for these belongings   For patients admitted to the hospital, discharge time will be determined by your treatment team.   Patients discharged the day of surgery will not be allowed to drive home, and someone needs to stay with them for 24 hours.  ONLY 1 SUPPORT PERSON MAY BE PRESENT WHILE YOU ARE IN SURGERY. IF YOU ARE TO BE ADMITTED ONCE YOU ARE IN YOUR ROOM YOU WILL BE ALLOWED TWO (2) VISITORS.  Minor children may have two parents present. Special consideration for safety and communication needs will be reviewed on a case by case basis.  Special instructions:    Oral Hygiene is also important to reduce your risk of infection.  Remember - BRUSH YOUR TEETH THE MORNING OF SURGERY WITH YOUR REGULAR TOOTHPASTE   [facility]- Preparing For Surgery  Before surgery, you can play an important role. Because skin is not sterile, your skin needs to be as free of germs as possible. You can reduce the number of germs on your skin by washing with CHG (chlorahexidine gluconate) Soap before surgery.  CHG is an antiseptic cleaner which kills germs and bonds with the skin to continue killing germs even after washing.     Please do not use if you have an allergy to CHG or antibacterial soaps. If your skin becomes reddened/irritated stop using the CHG.  Do not shave (including legs and underarms) for at least 48 hours prior to first CHG shower. It is OK to shave your face.  Please follow these instructions carefully.     Shower the NIGHT BEFORE SURGERY and the MORNING OF SURGERY with CHG Soap.   If you chose to wash your hair, wash your hair first as usual with your normal shampoo.  After you shampoo, rinse your hair and body thoroughly to remove the shampoo.  Then [employer] and genitals (private parts) with your normal soap and rinse thoroughly to remove soap.  After that Use CHG Soap as you would any other liquid soap. You can apply CHG directly to the skin and wash gently with a scrungie or a clean washcloth.   Apply the CHG Soap to your body ONLY FROM THE NECK DOWN.  Do not use on open wounds or open sores. Avoid contact with your eyes, ears, mouth and genitals (private parts). Wash Face and genitals (private parts)  with your normal soap.   Wash thoroughly, paying special attention to the area where your surgery will be performed.  Thoroughly rinse your body with warm water from the neck down.  DO NOT shower/wash with your normal soap after using and rinsing off the CHG Soap.  Pat yourself dry with a CLEAN TOWEL.  Wear CLEAN PAJAMAS to bed the night before surgery  Place CLEAN SHEETS on your bed the night before your surgery  DO NOT SLEEP WITH PETS.   Day of Surgery:  Take a shower with CHG soap. Wear Clean/Comfortable clothing the morning of surgery Do not apply any deodorants/lotions.   Remember to brush your teeth WITH YOUR REGULAR TOOTHPASTE.   Please read over the following fact sheets that you were given.

## 2021-03-03 ENCOUNTER — Inpatient Hospital Stay (HOSPITAL_COMMUNITY)
Admission: RE | Admit: 2021-03-03 | Discharge: 2021-03-03 | Disposition: A | Discharge status: Home / Self Care | Payer: Medicaid Other | Source: Ambulatory Visit

## 2021-03-05 DIAGNOSIS — Z9189 Other specified personal risk factors, not elsewhere classified: Secondary | ICD-10-CM | POA: Diagnosis not present

## 2021-03-05 DIAGNOSIS — Z79899 Other long term (current) drug therapy: Secondary | ICD-10-CM | POA: Diagnosis not present

## 2021-03-05 DIAGNOSIS — G894 Chronic pain syndrome: Secondary | ICD-10-CM | POA: Diagnosis not present

## 2021-03-05 DIAGNOSIS — M4802 Spinal stenosis, cervical region: Secondary | ICD-10-CM | POA: Diagnosis not present

## 2021-03-06 ENCOUNTER — Encounter (HOSPITAL_COMMUNITY): Payer: Self-pay

## 2021-03-06 ENCOUNTER — Encounter (HOSPITAL_COMMUNITY)
Admission: RE | Admit: 2021-03-06 | Discharge: 2021-03-06 | Disposition: A | Discharge status: Home / Self Care | Payer: Medicaid Other | Source: Ambulatory Visit | Attending: Neurosurgery | Admitting: Neurosurgery

## 2021-03-06 ENCOUNTER — Other Ambulatory Visit: Payer: Self-pay

## 2021-03-06 DIAGNOSIS — M5000 Cervical disc disorder with myelopathy, unspecified cervical region: Secondary | ICD-10-CM | POA: Insufficient documentation

## 2021-03-06 DIAGNOSIS — G47419 Narcolepsy without cataplexy: Secondary | ICD-10-CM | POA: Insufficient documentation

## 2021-03-06 DIAGNOSIS — Z79899 Other long term (current) drug therapy: Secondary | ICD-10-CM | POA: Diagnosis not present

## 2021-03-06 DIAGNOSIS — D649 Anemia, unspecified: Secondary | ICD-10-CM | POA: Diagnosis not present

## 2021-03-06 DIAGNOSIS — E119 Type 2 diabetes mellitus without complications: Secondary | ICD-10-CM | POA: Diagnosis not present

## 2021-03-06 DIAGNOSIS — G894 Chronic pain syndrome: Secondary | ICD-10-CM | POA: Diagnosis not present

## 2021-03-06 DIAGNOSIS — Z01812 Encounter for preprocedural laboratory examination: Secondary | ICD-10-CM | POA: Insufficient documentation

## 2021-03-06 DIAGNOSIS — E785 Hyperlipidemia, unspecified: Secondary | ICD-10-CM | POA: Insufficient documentation

## 2021-03-06 DIAGNOSIS — K219 Gastro-esophageal reflux disease without esophagitis: Secondary | ICD-10-CM | POA: Diagnosis not present

## 2021-03-06 DIAGNOSIS — Z7984 Long term (current) use of oral hypoglycemic drugs: Secondary | ICD-10-CM | POA: Insufficient documentation

## 2021-03-06 DIAGNOSIS — G4733 Obstructive sleep apnea (adult) (pediatric): Secondary | ICD-10-CM | POA: Diagnosis not present

## 2021-03-06 DIAGNOSIS — Z9981 Dependence on supplemental oxygen: Secondary | ICD-10-CM | POA: Insufficient documentation

## 2021-03-06 DIAGNOSIS — Z79891 Long term (current) use of opiate analgesic: Secondary | ICD-10-CM | POA: Insufficient documentation

## 2021-03-06 DIAGNOSIS — I10 Essential (primary) hypertension: Secondary | ICD-10-CM | POA: Insufficient documentation

## 2021-03-06 DIAGNOSIS — Z20822 Contact with and (suspected) exposure to covid-19: Secondary | ICD-10-CM | POA: Diagnosis not present

## 2021-03-06 DIAGNOSIS — J449 Chronic obstructive pulmonary disease, unspecified: Secondary | ICD-10-CM | POA: Diagnosis not present

## 2021-03-06 HISTORY — DX: Dyspnea, unspecified: R06.00

## 2021-03-06 LAB — COMPREHENSIVE METABOLIC PANEL
ALT: 14 U/L (ref 0–44)
AST: 15 U/L (ref 15–41)
Albumin: 3.1 g/dL — ABNORMAL LOW (ref 3.5–5.0)
Alkaline Phosphatase: 86 U/L (ref 38–126)
Anion gap: 7 (ref 5–15)
BUN: <5 mg/dL — ABNORMAL LOW (ref 6–20)
CO2: 30 mmol/L (ref 22–32)
Calcium: 9.2 mg/dL (ref 8.9–10.3)
Chloride: 100 mmol/L (ref 98–111)
Creatinine, Ser: 0.49 mg/dL (ref 0.44–1.00)
GFR, Estimated: >60 mL/min (ref >60)
Glucose, Bld: 92 mg/dL (ref 70–99)
Potassium: 3.7 mmol/L (ref 3.5–5.1)
Sodium: 137 mmol/L (ref 135–145)
Total Bilirubin: 0.3 mg/dL (ref 0.3–1.2)
Total Protein: 6.3 g/dL — ABNORMAL LOW (ref 6.5–8.1)

## 2021-03-06 LAB — SARS CORONAVIRUS 2 (TAT 6-24 HRS): SARS Coronavirus 2: NEGATIVE

## 2021-03-06 LAB — CBC
HCT: 44.2 % (ref 36.0–46.0)
Hemoglobin: 14.4 g/dL (ref 12.0–15.0)
MCH: 29.4 pg (ref 26.0–34.0)
MCHC: 32.6 g/dL (ref 30.0–36.0)
MCV: 90.4 fL (ref 80.0–100.0)
Platelets: 329 10*3/uL (ref 150–400)
RBC: 4.89 MIL/uL (ref 3.87–5.11)
RDW: 16.4 % — ABNORMAL HIGH (ref 11.5–15.5)
WBC: 9.3 10*3/uL (ref 4.0–10.5)
nRBC: 0 % (ref 0.0–0.2)

## 2021-03-06 LAB — TYPE AND SCREEN
ABO/RH(D): A POS
Antibody Screen: NEGATIVE

## 2021-03-06 LAB — HEMOGLOBIN A1C
Hgb A1c MFr Bld: 6.4 % — ABNORMAL HIGH (ref 4.8–5.6)
Mean Plasma Glucose: 136.98 mg/dL

## 2021-03-06 LAB — SURGICAL PCR SCREEN
MRSA, PCR: POSITIVE — AB
Staphylococcus aureus: POSITIVE — AB

## 2021-03-06 LAB — GLUCOSE, CAPILLARY: Glucose-Capillary: 116 mg/dL — ABNORMAL HIGH (ref 70–99)

## 2021-03-06 NOTE — Progress Notes (Signed)
PCP - Caryl Pina, MD Cardiologist - denies  PPM/ICD - denies Device Orders - N/A Rep Notified - N/A  Chest x-ray - 07/18/2020 EKG - 07/18/2020 Stress Test - 08/17/2012 ECHO - 1989 per patient, at [facility] Cardiac Cath - denies  Sleep Study - yes, positive for OSA - BiPAP nightly  Fasting Blood Sugar - not checking CBG at home A1C - 03/06/2021 CBG today - 116  Blood Thinner Instructions: N/A  Aspirin Instructions: Patient was instructed: As of today, STOP taking any Aspirin (unless otherwise instructed by your surgeon) Aleve, Naproxen, Ibuprofen, Motrin, Advil, Goody's, BC's, all herbal medications, fish oil, and all vitamins.  ERAS Protcol - No   COVID TEST- 03/06/2021 in PAT   Anesthesia review: yes; cardiac and pulmonary history  Patient denies shortness of breath, fever, cough and chest pain at PAT appointment   All instructions explained to the patient, with a verbal understanding of the material. Patient agrees to go over the instructions while at home for a better understanding. Patient also instructed to self quarantine after being tested for COVID-19. The opportunity to ask questions was provided.

## 2021-03-06 NOTE — Progress Notes (Signed)
Surgical PCR result positive for MRSA and Staph. Results relayed to Dr Windy Carina answering service.

## 2021-03-07 NOTE — Progress Notes (Signed)
Anesthesia Chart Review:  Case: [id number] Date/Time: 03/10/21 0715   Procedure: Posterior cervical fusion with lateral mass fixation - C1 - C4   Anesthesia type: General   Pre-op diagnosis: Cervical myelopathy   Location: [facility] OR ROOM 20 / [facility]   Surgeons: Kary Kos, MD       DISCUSSION: Patient is a 60 year old female scheduled for the above procedure.  History includes smoking, HTN, DM2, HLD, COPD, asthma, dyspnea, OSA (BiPAP, 2-4L O2 at night), narcolepsy, dysrhythmia (remote history of PSVT), heart murmur (reported history of MVP per 08/10/12 cardiology note with Dr. 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  03/06/2021 presurgical COVID-19 test negative.  Anesthesia team to evaluate on the day of surgery.   VS: BP 102/75   Pulse 98   Temp 36.8 C   Resp 20   Ht '5\' 4"'$  (1.626 m)   Wt 66.5 kg   SpO2 (!) 89%   BMI 25.16 kg/m    PROVIDERS: Dettinger, Fransisca Kaufmann, MD is PCP  Sarina Ill, MD is neurologist  She is not followed routinely by cardiology, but had an evaluation by Martinique, Peter, MD in 2014 for chest pain and dyspnea. She reported evaluation prior to 2014 for PSVT, PAF, MVP but provided vague details. Dr. Martinique felt dyspnea was related to her pulmonary disease. He did not hear a murmur. Stress test was ordered which was non-ischemic.   LABS: Labs reviewed: Acceptable for surgery. (all labs ordered are listed, but only abnormal results are displayed)  Labs Reviewed  SURGICAL PCR SCREEN - Abnormal; Notable for the following components:      Result Value   MRSA, PCR POSITIVE (*)    Staphylococcus aureus POSITIVE (*)    All other components within normal limits  GLUCOSE,  CAPILLARY - Abnormal; Notable for the following components:   Glucose-Capillary 116 (*)    All other components within normal limits  COMPREHENSIVE METABOLIC PANEL - Abnormal; Notable for the following components:   BUN <5 (*)    Total Protein 6.3 (*)    Albumin 3.1 (*)    All other components within normal limits  HEMOGLOBIN A1C - Abnormal; Notable for the following components:   Hgb A1c MFr Bld 6.4 (*)    All other components within normal limits  CBC - Abnormal; Notable for the following components:   RDW 16.4 (*)    All other components within normal limits  SARS CORONAVIRUS 2 (TAT 6-24 HRS)  TYPE AND SCREEN    OTHER: Per 12/12/15 [facility] note: " PSG 12-09-15 AHI=1.5 with patient spending 355 min <88% (99.7% of the night) and delayed slow wave sleep and REM onsets. ESS=17. patietn wore BiPAP up until the night of the study... Patient used BiPAP until night in lab. And still uses. Will get oxygen added to BiPAP, and will get pulmonolgy referal."   IMAGES: CT C-spine 01/10/21 [facility]) IMPRESSION: 1. 3.5 mm anterolisthesis at C2-3 has recurred since surgery 2. Lucency about the C3 screws bilaterally and right C2 screw is concerning for loosening. 3. Fracture above the C3 screws within the C3 vertebral body. 4. Right C2 screw is external to the vertebral body. 5. Interbody spacer at C2-3 is now 6 mm from the original position. 6. Foraminal narrowing bilaterally at C2-3  related to the anterolisthesis. 7. Mild bilateral foraminal narrowing at C3-4. 8. Moderate foraminal narrowing bilaterally at C4-5 is right greater than left. Multilevel spondylosis the remainder of the cervical spine is stable  MRI T-spine 11/18/20 [facility]): Impression: 1. Stable thoracic MRI. No neural impingement or visible  inflammation.  2. 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  3. C2-3 to C4-5 anterolisthesis which is accentuated compared to  preoperative CT August 07, 2020.    EKG:  07/18/20: Sinus rhythm Right bundle branch block Baseline wander in lead(s) I II aVR aVL V3 More wandering baseline. No STEMI Confirmed by Antony Blackbird [phone]) on 07/19/2020 5:54:17 PM  CV: Nuclear stress test 08/17/12: Overall Impression:  Normal stress nuclear study. LV Ejection Fraction: 63%.  LV Wall Motion:  NL LV Function; NL Wall Motion  Reported a remote history of an echocardiogram in 1989 done at [facility].   Past Medical History:  Diagnosis Date   Acid reflux    Allergies    Anemia    Asthma    Bronchitis    Cervical stenosis of spine    Chronic pain syndrome    COPD (chronic obstructive pulmonary disease) (HCC)    DDD (degenerative disc disease), lumbar    Depression with anxiety    Diabetes mellitus without complication ([place])    Dyspnea    Dysrhythmia    PSVT   Fatigue    H/O blood clots    H/O echocardiogram 1989   Per pt, done @ [place] mitral valve prolapse   Headache(784.0)    Heart murmur    Hemorrhoids, external    History of hiatal hernia    History of kidney stones    History of PSVT (paroxysmal supraventricular tachycardia)    HTN (hypertension)    Hyperlipidemia    Hypoxemia    Insomnia    Memory loss    Myalgia and myositis    Narcolepsy    On home O2    2-4 L @ HS   OSA (obstructive sleep apnea)    Pneumonia    Sleeping difficulty    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[place])    Thoracalgia    Tobacco abuse    Tremor    Ulcer disease     Past Surgical History:  Procedure Laterality Date   ABDOMINAL HYSTERECTOMY     ANTERIOR CERVICAL DECOMP/DISCECTOMY FUSION N/A 12/18/2020   Procedure: CERVICAL TWO-THREE ANTERIOR CERVICAL DISCECTOMY/DECOMPRESSION FUSION;  Surgeon: Kary Kos, MD;  Location: [place];  Service: Neurosurgery;  Laterality: N/A;   APPENDECTOMY  1992   CHOLECYSTECTOMY  1992   DIAGNOSTIC LAPAROSCOPY  1992   lap chole   HERNIA REPAIR     umbilical... as a child   OOPHORECTOMY     POSTERIOR CERVICAL  FUSION/FORAMINOTOMY N/A 08/07/2020   Procedure: POSTERIOR CERVICAL LAMINECTOMY FOR MYELOPATHY;  Surgeon: Kary Kos, MD;  Location: [place];  Service: Neurosurgery;  Laterality: N/A;   POSTERIOR CERVICAL LAMINECTOMY  08/07/2020   w/ Dr. Saintclair Halsted   tonsillectomy     TONSILLECTOMY     TUBAL LIGATION      MEDICATIONS:  atorvastatin (LIPITOR) 40 MG tablet   baclofen (LIORESAL) 10 MG tablet   cholecalciferol (VITAMIN D3) 25 MCG (1000 UNIT) tablet   cyclobenzaprine (FLEXERIL) 10 MG tablet   DULoxetine (CYMBALTA) 30 MG capsule   enalapril (VASOTEC) 5 MG tablet   esomeprazole (NEXIUM) 40 MG capsule   guaiFENesin (MUCINEX) 600 MG 12 hr tablet   methocarbamol (ROBAXIN) 500 MG  tablet   Multiple Vitamins-Minerals (HAIR SKIN NAILS PO)   Multiple Vitamins-Minerals (MULTIVITAMIN WITH MINERALS) tablet   nicotine (NICODERM CQ - DOSED IN MG/24 HOURS) 21 mg/24hr patch   nitrofurantoin (MACRODANTIN) 100 MG capsule   Omega-3 Fatty Acids (FISH OIL) 1000 MG CAPS   oxyCODONE-acetaminophen (PERCOCET) 10-325 MG tablet   oxyCODONE-acetaminophen (PERCOCET) 5-325 MG tablet   pregabalin (LYRICA) 150 MG capsule   PROAIR HFA 108 (90 Base) MCG/ACT inhaler   rizatriptan (MAXALT-MLT) 10 MG disintegrating tablet   sitaGLIPtin-metformin (JANUMET) 50-1000 MG tablet   tiotropium (SPIRIVA HANDIHALER) 18 MCG inhalation capsule   WIXELA INHUB 100-50 MCG/ACT AEPB   XTAMPZA ER 13.5 MG C12A   No current facility-administered medications for this encounter.    Myra Gianotti, PA-C Surgical Short Stay/Anesthesiology [facility] Phone [phone] [facility] Phone [phone] 03/07/2021 10:41 AM

## 2021-03-07 NOTE — Anesthesia Preprocedure Evaluation (Addendum)
Anesthesia Evaluation  Patient identified by MRN, date of birth, ID band Patient awake    Reviewed: Allergy & Precautions, NPO status , Patient's Chart, lab work & pertinent test results  Airway Mallampati: I  TM Distance: >3 FB Neck ROM: Full    Dental  (+) Edentulous Upper, Edentulous Lower   Pulmonary asthma , sleep apnea , COPD, Current Smoker,     + decreased breath sounds      Cardiovascular hypertension, Pt. on medications + dysrhythmias  Rhythm:Regular Rate:Normal     Neuro/Psych  Headaches, PSYCHIATRIC DISORDERS Anxiety Depression  Neuromuscular disease    GI/Hepatic Neg liver ROS, hiatal hernia, GERD  Medicated,  Endo/Other  diabetes, Type 2, Oral Hypoglycemic Agents  Renal/GU negative Renal ROS     Musculoskeletal  (+) Arthritis , Fibromyalgia -  Abdominal Normal abdominal exam  (+)   Peds  Hematology   Anesthesia Other Findings   Reproductive/Obstetrics                           Anesthesia Physical Anesthesia Plan  ASA: 3  Anesthesia Plan: General   Post-op Pain Management:    Induction: Intravenous  PONV Risk Score and Plan: 3 and Ondansetron, Dexamethasone and Midazolam  Airway Management Planned: Video Laryngoscope Planned and Oral ETT  Additional Equipment: None  Intra-op Plan:   Post-operative Plan: Extubation in OR  Informed Consent: I have reviewed the patients History and Physical, chart, labs and discussed the procedure including the risks, benefits and alternatives for the proposed anesthesia with the patient or authorized representative who has indicated his/her understanding and acceptance.     Dental advisory given  Plan Discussed with: CRNA  Anesthesia Plan Comments: (PAT note written 03/07/2021 by Myra Gianotti, PA-C.  Breathing tx in short stay)      Anesthesia Quick Evaluation

## 2021-03-10 ENCOUNTER — Inpatient Hospital Stay (HOSPITAL_COMMUNITY)
Admission: RE | Admit: 2021-03-10 | Discharge: 2021-03-14 | DRG: 472 | Disposition: A | Discharge status: Home / Self Care | Payer: Medicaid Other | Attending: Neurosurgery | Admitting: Neurosurgery

## 2021-03-10 ENCOUNTER — Encounter (HOSPITAL_COMMUNITY): Payer: Self-pay | Admitting: Neurosurgery

## 2021-03-10 ENCOUNTER — Inpatient Hospital Stay (HOSPITAL_COMMUNITY): Payer: Medicaid Other

## 2021-03-10 ENCOUNTER — Inpatient Hospital Stay (HOSPITAL_COMMUNITY): Payer: Medicaid Other | Admitting: Vascular Surgery

## 2021-03-10 ENCOUNTER — Encounter (HOSPITAL_COMMUNITY)
Admission: RE | Disposition: A | Discharge status: Home / Self Care | Payer: Self-pay | Source: Home / Self Care | Attending: Neurosurgery

## 2021-03-10 ENCOUNTER — Inpatient Hospital Stay (HOSPITAL_COMMUNITY): Payer: Medicaid Other | Admitting: Certified Registered Nurse Anesthetist

## 2021-03-10 DIAGNOSIS — Z9981 Dependence on supplemental oxygen: Secondary | ICD-10-CM | POA: Diagnosis not present

## 2021-03-10 DIAGNOSIS — E119 Type 2 diabetes mellitus without complications: Secondary | ICD-10-CM | POA: Diagnosis present

## 2021-03-10 DIAGNOSIS — Z419 Encounter for procedure for purposes other than remedying health state, unspecified: Secondary | ICD-10-CM

## 2021-03-10 DIAGNOSIS — Z8249 Family history of ischemic heart disease and other diseases of the circulatory system: Secondary | ICD-10-CM

## 2021-03-10 DIAGNOSIS — K219 Gastro-esophageal reflux disease without esophagitis: Secondary | ICD-10-CM | POA: Diagnosis present

## 2021-03-10 DIAGNOSIS — T84226A Displacement of internal fixation device of vertebrae, initial encounter: Secondary | ICD-10-CM | POA: Diagnosis present

## 2021-03-10 DIAGNOSIS — G992 Myelopathy in diseases classified elsewhere: Secondary | ICD-10-CM | POA: Diagnosis not present

## 2021-03-10 DIAGNOSIS — G4733 Obstructive sleep apnea (adult) (pediatric): Secondary | ICD-10-CM | POA: Diagnosis present

## 2021-03-10 DIAGNOSIS — Z7984 Long term (current) use of oral hypoglycemic drugs: Secondary | ICD-10-CM

## 2021-03-10 DIAGNOSIS — J449 Chronic obstructive pulmonary disease, unspecified: Secondary | ICD-10-CM | POA: Diagnosis present

## 2021-03-10 DIAGNOSIS — G4700 Insomnia, unspecified: Secondary | ICD-10-CM | POA: Diagnosis present

## 2021-03-10 DIAGNOSIS — Z7951 Long term (current) use of inhaled steroids: Secondary | ICD-10-CM

## 2021-03-10 DIAGNOSIS — Z91041 Radiographic dye allergy status: Secondary | ICD-10-CM

## 2021-03-10 DIAGNOSIS — Z9049 Acquired absence of other specified parts of digestive tract: Secondary | ICD-10-CM

## 2021-03-10 DIAGNOSIS — J9611 Chronic respiratory failure with hypoxia: Secondary | ICD-10-CM | POA: Diagnosis present

## 2021-03-10 DIAGNOSIS — G959 Disease of spinal cord, unspecified: Secondary | ICD-10-CM | POA: Diagnosis present

## 2021-03-10 DIAGNOSIS — M532X2 Spinal instabilities, cervical region: Secondary | ICD-10-CM | POA: Diagnosis not present

## 2021-03-10 DIAGNOSIS — Z981 Arthrodesis status: Secondary | ICD-10-CM | POA: Diagnosis not present

## 2021-03-10 DIAGNOSIS — M6281 Muscle weakness (generalized): Secondary | ICD-10-CM | POA: Diagnosis present

## 2021-03-10 DIAGNOSIS — Z91013 Allergy to seafood: Secondary | ICD-10-CM

## 2021-03-10 DIAGNOSIS — Z79899 Other long term (current) drug therapy: Secondary | ICD-10-CM | POA: Diagnosis not present

## 2021-03-10 DIAGNOSIS — G8250 Quadriplegia, unspecified: Secondary | ICD-10-CM | POA: Diagnosis not present

## 2021-03-10 DIAGNOSIS — Y831 Surgical operation with implant of artificial internal device as the cause of abnormal reaction of the patient, or of later complication, without mention of misadventure at the time of the procedure: Secondary | ICD-10-CM | POA: Diagnosis present

## 2021-03-10 DIAGNOSIS — M4322 Fusion of spine, cervical region: Secondary | ICD-10-CM | POA: Diagnosis not present

## 2021-03-10 DIAGNOSIS — I10 Essential (primary) hypertension: Secondary | ICD-10-CM | POA: Diagnosis present

## 2021-03-10 DIAGNOSIS — G8251 Quadriplegia, C1-C4 complete: Secondary | ICD-10-CM | POA: Diagnosis not present

## 2021-03-10 DIAGNOSIS — M4802 Spinal stenosis, cervical region: Secondary | ICD-10-CM | POA: Diagnosis present

## 2021-03-10 DIAGNOSIS — F17210 Nicotine dependence, cigarettes, uncomplicated: Secondary | ICD-10-CM | POA: Diagnosis present

## 2021-03-10 DIAGNOSIS — Z90710 Acquired absence of both cervix and uterus: Secondary | ICD-10-CM | POA: Diagnosis not present

## 2021-03-10 DIAGNOSIS — G894 Chronic pain syndrome: Secondary | ICD-10-CM | POA: Diagnosis present

## 2021-03-10 DIAGNOSIS — F418 Other specified anxiety disorders: Secondary | ICD-10-CM | POA: Diagnosis present

## 2021-03-10 DIAGNOSIS — E785 Hyperlipidemia, unspecified: Secondary | ICD-10-CM | POA: Diagnosis present

## 2021-03-10 HISTORY — PX: POSTERIOR CERVICAL FUSION/FORAMINOTOMY: SHX5038

## 2021-03-10 LAB — GLUCOSE, CAPILLARY
Glucose-Capillary: 97 mg/dL (ref 70–99)
Glucose-Capillary: 146 mg/dL — ABNORMAL HIGH (ref 70–99)

## 2021-03-10 SURGERY — POSTERIOR CERVICAL FUSION/FORAMINOTOMY LEVEL 3
Anesthesia: General

## 2021-03-10 MED ORDER — LACTATED RINGERS IV SOLN: INTRAVENOUS | Status: DC

## 2021-03-10 MED ORDER — ACETAMINOPHEN 325 MG PO TABS: 325.0000 mg | ORAL_TABLET | ORAL | Status: DC | PRN

## 2021-03-10 MED ORDER — ACETAMINOPHEN 10 MG/ML IV SOLN
INTRAVENOUS | Status: AC
  Filled 2021-03-10: qty 100

## 2021-03-10 MED ORDER — NICOTINE 21 MG/24HR TD PT24: 21.0000 mg | MEDICATED_PATCH | Freq: Every day | TRANSDERMAL | Status: DC | PRN

## 2021-03-10 MED ORDER — NITROFURANTOIN MACROCRYSTAL 50 MG PO CAPS
100.0000 mg | ORAL_CAPSULE | Freq: Every day | ORAL | Status: DC
  Administered 2021-03-11 – 2021-03-14 (×4): 100 mg via ORAL
  Filled 2021-03-10: qty 1
  Filled 2021-03-10: qty 2
  Filled 2021-03-10: qty 1
  Filled 2021-03-10 (×2): qty 2

## 2021-03-10 MED ORDER — ONDANSETRON HCL 4 MG PO TABS
4.0000 mg | ORAL_TABLET | Freq: Four times a day (QID) | ORAL | Status: DC | PRN
  Administered 2021-03-14: 4 mg via ORAL
  Filled 2021-03-10: qty 1

## 2021-03-10 MED ORDER — LACTATED RINGERS IV SOLN: INTRAVENOUS | Status: DC | PRN

## 2021-03-10 MED ORDER — DEXAMETHASONE SODIUM PHOSPHATE 10 MG/ML IJ SOLN
INTRAMUSCULAR | Status: AC
  Filled 2021-03-10: qty 1

## 2021-03-10 MED ORDER — ACETAMINOPHEN 160 MG/5ML PO SOLN: 325.0000 mg | ORAL | Status: DC | PRN

## 2021-03-10 MED ORDER — 0.9 % SODIUM CHLORIDE (POUR BTL) OPTIME
TOPICAL | Status: DC | PRN
  Administered 2021-03-10: 1000 mL

## 2021-03-10 MED ORDER — PREGABALIN 75 MG PO CAPS
150.0000 mg | ORAL_CAPSULE | Freq: Three times a day (TID) | ORAL | Status: DC
  Administered 2021-03-10 – 2021-03-14 (×12): 150 mg via ORAL
  Filled 2021-03-10 (×11): qty 2

## 2021-03-10 MED ORDER — SODIUM CHLORIDE 0.9% FLUSH
3.0000 mL | Freq: Two times a day (BID) | INTRAVENOUS | Status: DC
  Administered 2021-03-10 – 2021-03-14 (×9): 3 mL via INTRAVENOUS

## 2021-03-10 MED ORDER — OXYCODONE HCL 5 MG/5ML PO SOLN: 5.0000 mg | Freq: Once | ORAL | Status: AC | PRN

## 2021-03-10 MED ORDER — MIDAZOLAM HCL 2 MG/2ML IJ SOLN
INTRAMUSCULAR | Status: AC
  Filled 2021-03-10: qty 2

## 2021-03-10 MED ORDER — CEFAZOLIN SODIUM-DEXTROSE 2-4 GM/100ML-% IV SOLN
2.0000 g | INTRAVENOUS | Status: AC
  Administered 2021-03-10: 2 g via INTRAVENOUS

## 2021-03-10 MED ORDER — BUPIVACAINE HCL (PF) 0.25 % IJ SOLN
INTRAMUSCULAR | Status: DC | PRN
  Administered 2021-03-10: 10 mL

## 2021-03-10 MED ORDER — ALBUTEROL SULFATE (2.5 MG/3ML) 0.083% IN NEBU
INHALATION_SOLUTION | RESPIRATORY_TRACT | Status: AC
  Administered 2021-03-10: 2.5 mg via RESPIRATORY_TRACT
  Filled 2021-03-10: qty 3

## 2021-03-10 MED ORDER — CHLORHEXIDINE GLUCONATE CLOTH 2 % EX PADS: 6.0000 | MEDICATED_PAD | Freq: Once | CUTANEOUS | Status: DC

## 2021-03-10 MED ORDER — LIDOCAINE-EPINEPHRINE 1 %-1:100000 IJ SOLN
INTRAMUSCULAR | Status: DC | PRN
  Administered 2021-03-10: 10 mL

## 2021-03-10 MED ORDER — CYCLOBENZAPRINE HCL 10 MG PO TABS
10.0000 mg | ORAL_TABLET | Freq: Three times a day (TID) | ORAL | Status: DC
  Administered 2021-03-10 – 2021-03-14 (×12): 10 mg via ORAL
  Filled 2021-03-10 (×12): qty 1

## 2021-03-10 MED ORDER — FENTANYL CITRATE (PF) 100 MCG/2ML IJ SOLN
INTRAMUSCULAR | Status: AC
  Filled 2021-03-10: qty 2

## 2021-03-10 MED ORDER — ROCURONIUM BROMIDE 10 MG/ML (PF) SYRINGE
PREFILLED_SYRINGE | INTRAVENOUS | Status: DC | PRN
  Administered 2021-03-10: 10 mg via INTRAVENOUS
  Administered 2021-03-10: 50 mg via INTRAVENOUS
  Administered 2021-03-10: 20 mg via INTRAVENOUS
  Administered 2021-03-10: 30 mg via INTRAVENOUS
  Administered 2021-03-10: 5 mg via INTRAVENOUS

## 2021-03-10 MED ORDER — ALBUTEROL SULFATE (2.5 MG/3ML) 0.083% IN NEBU: 2.5000 mg | INHALATION_SOLUTION | Freq: Once | RESPIRATORY_TRACT | Status: AC

## 2021-03-10 MED ORDER — PHENOL 1.4 % MT LIQD: 1.0000 | OROMUCOSAL | Status: DC | PRN

## 2021-03-10 MED ORDER — OXYCODONE HCL ER 10 MG PO T12A
10.0000 mg | EXTENDED_RELEASE_TABLET | Freq: Two times a day (BID) | ORAL | Status: DC
  Administered 2021-03-10 – 2021-03-14 (×8): 10 mg via ORAL
  Filled 2021-03-10 (×8): qty 1

## 2021-03-10 MED ORDER — ONDANSETRON HCL 4 MG/2ML IJ SOLN
INTRAMUSCULAR | Status: DC | PRN
  Administered 2021-03-10: 4 mg via INTRAVENOUS

## 2021-03-10 MED ORDER — ONDANSETRON HCL 4 MG/2ML IJ SOLN
INTRAMUSCULAR | Status: AC
  Filled 2021-03-10: qty 2

## 2021-03-10 MED ORDER — ONDANSETRON HCL 4 MG/2ML IJ SOLN
4.0000 mg | Freq: Four times a day (QID) | INTRAMUSCULAR | Status: DC | PRN
  Administered 2021-03-13: 4 mg via INTRAVENOUS
  Filled 2021-03-10: qty 2

## 2021-03-10 MED ORDER — SODIUM CHLORIDE 0.9 % IV SOLN: 250.0000 mL | INTRAVENOUS | Status: DC

## 2021-03-10 MED ORDER — MUPIROCIN 2 % EX OINT
TOPICAL_OINTMENT | CUTANEOUS | Status: AC
  Administered 2021-03-10: 1 via TOPICAL
  Filled 2021-03-10: qty 22

## 2021-03-10 MED ORDER — MUPIROCIN 2 % EX OINT: 1.0000 "application " | TOPICAL_OINTMENT | Freq: Once | CUTANEOUS | Status: AC

## 2021-03-10 MED ORDER — OXYCODONE HCL 5 MG PO TABS
10.0000 mg | ORAL_TABLET | ORAL | Status: DC | PRN
  Administered 2021-03-10 – 2021-03-14 (×12): 10 mg via ORAL
  Filled 2021-03-10 (×12): qty 2

## 2021-03-10 MED ORDER — ACETAMINOPHEN 10 MG/ML IV SOLN
1000.0000 mg | Freq: Once | INTRAVENOUS | Status: DC | PRN
  Administered 2021-03-10: 1000 mg via INTRAVENOUS

## 2021-03-10 MED ORDER — BACITRACIN ZINC 500 UNIT/GM EX OINT
TOPICAL_OINTMENT | CUTANEOUS | Status: DC | PRN
  Administered 2021-03-10: 1 via TOPICAL

## 2021-03-10 MED ORDER — CEFAZOLIN SODIUM-DEXTROSE 2-4 GM/100ML-% IV SOLN
INTRAVENOUS | Status: AC
  Filled 2021-03-10: qty 100

## 2021-03-10 MED ORDER — ATORVASTATIN CALCIUM 40 MG PO TABS
40.0000 mg | ORAL_TABLET | Freq: Every day | ORAL | Status: DC
  Administered 2021-03-10 – 2021-03-14 (×5): 40 mg via ORAL
  Filled 2021-03-10 (×5): qty 1

## 2021-03-10 MED ORDER — ACETAMINOPHEN 325 MG PO TABS
650.0000 mg | ORAL_TABLET | ORAL | Status: DC | PRN
  Administered 2021-03-11 – 2021-03-14 (×6): 650 mg via ORAL
  Filled 2021-03-10 (×6): qty 2

## 2021-03-10 MED ORDER — OXYCODONE HCL 5 MG PO TABS
5.0000 mg | ORAL_TABLET | Freq: Once | ORAL | Status: AC | PRN
  Administered 2021-03-10: 5 mg via ORAL

## 2021-03-10 MED ORDER — UMECLIDINIUM BROMIDE 62.5 MCG/INH IN AEPB
1.0000 | INHALATION_SPRAY | Freq: Every day | RESPIRATORY_TRACT | Status: DC
  Administered 2021-03-11 – 2021-03-14 (×4): 1 via RESPIRATORY_TRACT
  Filled 2021-03-10: qty 7

## 2021-03-10 MED ORDER — CEFAZOLIN SODIUM-DEXTROSE 2-4 GM/100ML-% IV SOLN
2.0000 g | Freq: Three times a day (TID) | INTRAVENOUS | Status: AC
  Administered 2021-03-10 – 2021-03-12 (×6): 2 g via INTRAVENOUS
  Filled 2021-03-10 (×6): qty 100

## 2021-03-10 MED ORDER — DEXAMETHASONE SODIUM PHOSPHATE 10 MG/ML IJ SOLN
INTRAMUSCULAR | Status: DC | PRN
  Administered 2021-03-10: 10 mg via INTRAVENOUS

## 2021-03-10 MED ORDER — FENTANYL CITRATE (PF) 100 MCG/2ML IJ SOLN
25.0000 ug | INTRAMUSCULAR | Status: AC | PRN
  Administered 2021-03-10: 25 ug via INTRAVENOUS
  Administered 2021-03-10 (×4): 50 ug via INTRAVENOUS
  Administered 2021-03-10: 25 ug via INTRAVENOUS

## 2021-03-10 MED ORDER — CHLORHEXIDINE GLUCONATE 0.12 % MT SOLN: 15.0000 mL | Freq: Once | OROMUCOSAL | Status: AC

## 2021-03-10 MED ORDER — ALBUTEROL SULFATE (2.5 MG/3ML) 0.083% IN NEBU: 2.5000 mg | INHALATION_SOLUTION | Freq: Four times a day (QID) | RESPIRATORY_TRACT | Status: DC | PRN

## 2021-03-10 MED ORDER — GUAIFENESIN ER 600 MG PO TB12
1200.0000 mg | ORAL_TABLET | Freq: Two times a day (BID) | ORAL | Status: DC
  Administered 2021-03-10 – 2021-03-14 (×8): 1200 mg via ORAL
  Filled 2021-03-10 (×8): qty 2

## 2021-03-10 MED ORDER — FENTANYL CITRATE (PF) 250 MCG/5ML IJ SOLN
INTRAMUSCULAR | Status: AC
  Filled 2021-03-10: qty 5

## 2021-03-10 MED ORDER — HAIR SKIN NAILS PO CAPS: ORAL_CAPSULE | Freq: Every day | ORAL | Status: DC

## 2021-03-10 MED ORDER — ROCURONIUM BROMIDE 10 MG/ML (PF) SYRINGE
PREFILLED_SYRINGE | INTRAVENOUS | Status: AC
  Filled 2021-03-10: qty 10

## 2021-03-10 MED ORDER — PANTOPRAZOLE SODIUM 40 MG IV SOLR
40.0000 mg | Freq: Every day | INTRAVENOUS | Status: DC
  Administered 2021-03-10: 40 mg via INTRAVENOUS
  Filled 2021-03-10: qty 40

## 2021-03-10 MED ORDER — HYDROMORPHONE HCL 1 MG/ML IJ SOLN
0.5000 mg | INTRAMUSCULAR | Status: DC | PRN
  Administered 2021-03-11 – 2021-03-13 (×7): 0.5 mg via INTRAVENOUS
  Filled 2021-03-10 (×7): qty 1

## 2021-03-10 MED ORDER — ADULT MULTIVITAMIN W/MINERALS CH
1.0000 | ORAL_TABLET | Freq: Every day | ORAL | Status: DC
  Administered 2021-03-10 – 2021-03-14 (×5): 1 via ORAL
  Filled 2021-03-10 (×5): qty 1

## 2021-03-10 MED ORDER — AMISULPRIDE (ANTIEMETIC) 5 MG/2ML IV SOLN: 10.0000 mg | Freq: Once | INTRAVENOUS | Status: DC | PRN

## 2021-03-10 MED ORDER — ACETAMINOPHEN 325 MG PO TABS
325.0000 mg | ORAL_TABLET | ORAL | Status: DC | PRN
  Administered 2021-03-10 – 2021-03-12 (×5): 325 mg via ORAL
  Filled 2021-03-10 (×5): qty 1

## 2021-03-10 MED ORDER — MOMETASONE FURO-FORMOTEROL FUM 100-5 MCG/ACT IN AERO
2.0000 | INHALATION_SPRAY | Freq: Two times a day (BID) | RESPIRATORY_TRACT | Status: DC
  Administered 2021-03-11 – 2021-03-14 (×7): 2 via RESPIRATORY_TRACT
  Filled 2021-03-10: qty 8.8

## 2021-03-10 MED ORDER — DEXAMETHASONE SODIUM PHOSPHATE 10 MG/ML IJ SOLN: 10.0000 mg | Freq: Once | INTRAMUSCULAR | Status: DC

## 2021-03-10 MED ORDER — PROPOFOL 10 MG/ML IV BOLUS
INTRAVENOUS | Status: AC
  Filled 2021-03-10: qty 20

## 2021-03-10 MED ORDER — OXYCODONE HCL 5 MG PO TABS
ORAL_TABLET | ORAL | Status: AC
  Filled 2021-03-10: qty 1

## 2021-03-10 MED ORDER — FENTANYL CITRATE (PF) 250 MCG/5ML IJ SOLN
INTRAMUSCULAR | Status: DC | PRN
  Administered 2021-03-10 (×5): 50 ug via INTRAVENOUS

## 2021-03-10 MED ORDER — THROMBIN 20000 UNITS EX SOLR
CUTANEOUS | Status: AC
  Filled 2021-03-10: qty 20000

## 2021-03-10 MED ORDER — OMEGA-3-ACID ETHYL ESTERS 1 G PO CAPS
1.0000 g | ORAL_CAPSULE | Freq: Every day | ORAL | Status: DC
  Administered 2021-03-10 – 2021-03-14 (×5): 1 g via ORAL
  Filled 2021-03-10 (×5): qty 1

## 2021-03-10 MED ORDER — BACITRACIN ZINC 500 UNIT/GM EX OINT
TOPICAL_OINTMENT | CUTANEOUS | Status: AC
  Filled 2021-03-10: qty 28.35

## 2021-03-10 MED ORDER — LIDOCAINE 2% (20 MG/ML) 5 ML SYRINGE
INTRAMUSCULAR | Status: DC | PRN
  Administered 2021-03-10: 40 mg via INTRAVENOUS

## 2021-03-10 MED ORDER — CHLORHEXIDINE GLUCONATE 0.12 % MT SOLN
OROMUCOSAL | Status: AC
  Administered 2021-03-10: 15 mL via OROMUCOSAL
  Filled 2021-03-10: qty 15

## 2021-03-10 MED ORDER — SITAGLIPTIN PHOS-METFORMIN HCL 50-1000 MG PO TABS: 1.0000 | ORAL_TABLET | Freq: Two times a day (BID) | ORAL | Status: DC

## 2021-03-10 MED ORDER — CYCLOBENZAPRINE HCL 10 MG PO TABS: 10.0000 mg | ORAL_TABLET | Freq: Three times a day (TID) | ORAL | Status: DC | PRN

## 2021-03-10 MED ORDER — LIDOCAINE-EPINEPHRINE 1 %-1:100000 IJ SOLN
INTRAMUSCULAR | Status: AC
  Filled 2021-03-10: qty 1

## 2021-03-10 MED ORDER — BACLOFEN 10 MG PO TABS
10.0000 mg | ORAL_TABLET | Freq: Three times a day (TID) | ORAL | Status: DC | PRN
  Administered 2021-03-10 – 2021-03-14 (×3): 10 mg via ORAL
  Filled 2021-03-10 (×3): qty 1

## 2021-03-10 MED ORDER — PROPOFOL 10 MG/ML IV BOLUS
INTRAVENOUS | Status: DC | PRN
  Administered 2021-03-10: 50 mg via INTRAVENOUS
  Administered 2021-03-10: 100 mg via INTRAVENOUS

## 2021-03-10 MED ORDER — LINAGLIPTIN 5 MG PO TABS
5.0000 mg | ORAL_TABLET | Freq: Every day | ORAL | Status: DC
  Administered 2021-03-11 – 2021-03-14 (×4): 5 mg via ORAL
  Filled 2021-03-10 (×4): qty 1

## 2021-03-10 MED ORDER — METFORMIN HCL 500 MG PO TABS
1000.0000 mg | ORAL_TABLET | Freq: Two times a day (BID) | ORAL | Status: DC
  Administered 2021-03-11 – 2021-03-14 (×7): 1000 mg via ORAL
  Filled 2021-03-10 (×7): qty 2

## 2021-03-10 MED ORDER — PROMETHAZINE HCL 25 MG/ML IJ SOLN: 6.2500 mg | INTRAMUSCULAR | Status: DC | PRN

## 2021-03-10 MED ORDER — SUMATRIPTAN SUCCINATE 25 MG PO TABS
100.0000 mg | ORAL_TABLET | ORAL | Status: DC | PRN
  Filled 2021-03-10: qty 4

## 2021-03-10 MED ORDER — LABETALOL HCL 5 MG/ML IV SOLN: 5.0000 mg | Freq: Two times a day (BID) | INTRAVENOUS | Status: DC | PRN

## 2021-03-10 MED ORDER — SUGAMMADEX SODIUM 200 MG/2ML IV SOLN
INTRAVENOUS | Status: DC | PRN
Start: 2021-03-10 — End: 2021-03-10
  Administered 2021-03-10: 200 mg via INTRAVENOUS

## 2021-03-10 MED ORDER — LIDOCAINE 2% (20 MG/ML) 5 ML SYRINGE
INTRAMUSCULAR | Status: AC
  Filled 2021-03-10: qty 5

## 2021-03-10 MED ORDER — THROMBIN 20000 UNITS EX SOLR
CUTANEOUS | Status: DC | PRN
  Administered 2021-03-10: 20 mL via TOPICAL

## 2021-03-10 MED ORDER — SODIUM CHLORIDE 0.9% FLUSH: 3.0000 mL | INTRAVENOUS | Status: DC | PRN

## 2021-03-10 MED ORDER — PHENYLEPHRINE HCL-NACL 20-0.9 MG/250ML-% IV SOLN
INTRAVENOUS | Status: DC | PRN
Start: 2021-03-10 — End: 2021-03-10
  Administered 2021-03-10: 15 ug/min via INTRAVENOUS

## 2021-03-10 MED ORDER — ORAL CARE MOUTH RINSE: 15.0000 mL | Freq: Once | OROMUCOSAL | Status: AC

## 2021-03-10 MED ORDER — BUPIVACAINE HCL (PF) 0.25 % IJ SOLN
INTRAMUSCULAR | Status: AC
  Filled 2021-03-10: qty 30

## 2021-03-10 MED ORDER — ALUM & MAG HYDROXIDE-SIMETH 200-200-20 MG/5ML PO SUSP: 30.0000 mL | Freq: Four times a day (QID) | ORAL | Status: DC | PRN

## 2021-03-10 MED ORDER — MENTHOL 3 MG MT LOZG: 1.0000 | LOZENGE | OROMUCOSAL | Status: DC | PRN

## 2021-03-10 MED ORDER — ACETAMINOPHEN 650 MG RE SUPP: 650.0000 mg | RECTAL | Status: DC | PRN

## 2021-03-10 MED ORDER — MIDAZOLAM HCL 5 MG/5ML IJ SOLN
INTRAMUSCULAR | Status: DC | PRN
  Administered 2021-03-10: 2 mg via INTRAVENOUS

## 2021-03-10 MED ORDER — OXYCODONE-ACETAMINOPHEN 10-325 MG PO TABS: 1.0000 | ORAL_TABLET | ORAL | Status: DC | PRN

## 2021-03-10 MED ORDER — DULOXETINE HCL 30 MG PO CPEP
30.0000 mg | ORAL_CAPSULE | Freq: Every day | ORAL | Status: DC
  Administered 2021-03-10 – 2021-03-14 (×5): 30 mg via ORAL
  Filled 2021-03-10 (×5): qty 1

## 2021-03-10 MED ORDER — VITAMIN D 25 MCG (1000 UNIT) PO TABS
1000.0000 [IU] | ORAL_TABLET | Freq: Every day | ORAL | Status: DC
  Administered 2021-03-10 – 2021-03-14 (×5): 1000 [IU] via ORAL
  Filled 2021-03-10 (×5): qty 1

## 2021-03-10 SURGICAL SUPPLY — 70 items
BAG COUNTER SPONGE SURGICOUNT (BAG) ×2 IMPLANT
BAND RUBBER #18 3X1/16 STRL (MISCELLANEOUS) IMPLANT
BENZOIN TINCTURE PRP APPL 2/3 (GAUZE/BANDAGES/DRESSINGS) ×2 IMPLANT
BIT DRILL SCRW 3.5 (BIT) ×2 IMPLANT
BLADE CLIPPER SURG (BLADE) ×2 IMPLANT
BLADE SURG 11 STRL SS (BLADE) ×2 IMPLANT
BONE VIVIGEN FORMABLE 5.4CC (Bone Implant) ×2 IMPLANT
BUR MATCHSTICK NEURO 3.0 LAGG (BURR) ×4 IMPLANT
CANISTER SUCT 3000ML PPV (MISCELLANEOUS) ×2 IMPLANT
CAP LOCKING (Cap) ×8 IMPLANT
CARTRIDGE OIL MAESTRO DRILL (MISCELLANEOUS) ×1 IMPLANT
CLSR STERI-STRIP ANTIMIC 1/2X4 (GAUZE/BANDAGES/DRESSINGS) ×2 IMPLANT
DECANTER SPIKE VIAL GLASS SM (MISCELLANEOUS) IMPLANT
DERMABOND ADVANCED (GAUZE/BANDAGES/DRESSINGS) ×1
DERMABOND ADVANCED .7 DNX12 (GAUZE/BANDAGES/DRESSINGS) ×1 IMPLANT
DIFFUSER DRILL AIR PNEUMATIC (MISCELLANEOUS) ×2 IMPLANT
DRAPE C-ARM 42X72 X-RAY (DRAPES) IMPLANT
DRAPE LAPAROTOMY 100X72 PEDS (DRAPES) ×2 IMPLANT
DRAPE MICROSCOPE LEICA (MISCELLANEOUS) IMPLANT
DRAPE SURG 17X23 STRL (DRAPES) ×8 IMPLANT
DRSG OPSITE 4X5.5 SM (GAUZE/BANDAGES/DRESSINGS) ×2 IMPLANT
DRSG OPSITE POSTOP 3X4 (GAUZE/BANDAGES/DRESSINGS) ×2 IMPLANT
DRSG OPSITE POSTOP 4X8 (GAUZE/BANDAGES/DRESSINGS) ×2 IMPLANT
DURAPREP 6ML APPLICATOR 50/CS (WOUND CARE) ×2 IMPLANT
ELECT REM PT RETURN 9FT ADLT (ELECTROSURGICAL) ×2
ELECTRODE REM PT RTRN 9FT ADLT (ELECTROSURGICAL) ×1 IMPLANT
EVACUATOR 1/8 PVC DRAIN (DRAIN) ×2 IMPLANT
GAUZE 4X4 16PLY ~~LOC~~+RFID DBL (SPONGE) IMPLANT
GAUZE SPONGE 4X4 12PLY STRL (GAUZE/BANDAGES/DRESSINGS) ×4 IMPLANT
GAUZE SPONGE 4X4 12PLY STRL LF (GAUZE/BANDAGES/DRESSINGS) ×2 IMPLANT
GLOVE EXAM NITRILE XL STR (GLOVE) IMPLANT
GLOVE SURG ENC MOIS LTX SZ7 (GLOVE) IMPLANT
GLOVE SURG ENC MOIS LTX SZ8 (GLOVE) ×2 IMPLANT
GLOVE SURG UNDER LTX SZ8.5 (GLOVE) ×2 IMPLANT
GLOVE SURG UNDER POLY LF SZ7 (GLOVE) IMPLANT
GOWN STRL REUS W/ TWL LRG LVL3 (GOWN DISPOSABLE) IMPLANT
GOWN STRL REUS W/ TWL XL LVL3 (GOWN DISPOSABLE) ×1 IMPLANT
GOWN STRL REUS W/TWL 2XL LVL3 (GOWN DISPOSABLE) IMPLANT
GOWN STRL REUS W/TWL LRG LVL3 (GOWN DISPOSABLE)
GOWN STRL REUS W/TWL XL LVL3 (GOWN DISPOSABLE) ×2
IMPL QUARTEX 3.5X14MM (Neuro Prosthesis/Implant) ×6 IMPLANT
IMPL QUARTEX 3.5X16MM (Neuro Prosthesis/Implant) ×2 IMPLANT
IMPLANT QUARTEX 3.5X14MM (Neuro Prosthesis/Implant) ×12 IMPLANT
IMPLANT QUARTEX 3.5X16MM (Neuro Prosthesis/Implant) ×4 IMPLANT
KIT BASIN OR (CUSTOM PROCEDURE TRAY) ×2 IMPLANT
KIT INFUSE SMALL (Orthopedic Implant) ×2 IMPLANT
KIT TURNOVER KIT B (KITS) ×2 IMPLANT
LOCKING CAP (Cap) ×16 IMPLANT
MARKER SKIN DUAL TIP RULER LAB (MISCELLANEOUS) ×2 IMPLANT
NEEDLE HYPO 25X1 1.5 SAFETY (NEEDLE) ×2 IMPLANT
NEEDLE SPNL 20GX3.5 QUINCKE YW (NEEDLE) ×2 IMPLANT
NS IRRIG 1000ML POUR BTL (IV SOLUTION) ×2 IMPLANT
OIL CARTRIDGE MAESTRO DRILL (MISCELLANEOUS) ×2
PACK LAMINECTOMY NEURO (CUSTOM PROCEDURE TRAY) ×2 IMPLANT
PAD ARMBOARD 7.5X6 YLW CONV (MISCELLANEOUS) ×6 IMPLANT
PIN MAYFIELD SKULL DISP (PIN) ×2 IMPLANT
ROD CURVED CAPITOL 3.5X55 (Rod) ×4 IMPLANT
SPONGE SURGIFOAM ABS GEL 100 (HEMOSTASIS) ×2 IMPLANT
SPONGE T-LAP 4X18 ~~LOC~~+RFID (SPONGE) IMPLANT
STRIP CLOSURE SKIN 1/2X4 (GAUZE/BANDAGES/DRESSINGS) ×2 IMPLANT
SUT ETHILON 4 0 PS 2 18 (SUTURE) IMPLANT
SUT VIC AB 0 CT1 18XCR BRD8 (SUTURE) ×1 IMPLANT
SUT VIC AB 0 CT1 8-18 (SUTURE) ×2
SUT VIC AB 2-0 CT1 18 (SUTURE) ×6 IMPLANT
SUT VIC AB 4-0 PS2 27 (SUTURE) ×2 IMPLANT
SYR CONTROL 10ML LL (SYRINGE) ×2 IMPLANT
TOWEL GREEN STERILE (TOWEL DISPOSABLE) ×2 IMPLANT
TOWEL GREEN STERILE FF (TOWEL DISPOSABLE) ×2 IMPLANT
TRAY FOLEY MTR SLVR 16FR STAT (SET/KITS/TRAYS/PACK) IMPLANT
WATER STERILE IRR 1000ML POUR (IV SOLUTION) ×2 IMPLANT

## 2021-03-10 NOTE — Transfer of Care (Signed)
Immediate Anesthesia Transfer of Care Note  Patient: Margaret Vaughn  Procedure(s) Performed: Posterior cervical fusion with lateral mass fixation Cervical one - cervical four  Patient Location: PACU  Anesthesia Type:General  Level of Consciousness: awake and alert   Airway & Oxygen Therapy: Patient Spontanous Breathing and Patient connected to face mask oxygen  Post-op Assessment: Report given to RN, Post -op Vital signs reviewed and stable and Patient moving all extremities X 4  Post vital signs: Reviewed and stable  Last Vitals:  Vitals Value Taken Time  BP 151/92 03/10/21 1059  Temp    Pulse 108 03/10/21 1103  Resp 18 03/10/21 1103  SpO2 100 % 03/10/21 1103  Vitals shown include unvalidated device data.  Last Pain:  Vitals:   03/10/21 0630  TempSrc:   PainSc: 6       Patients Stated Pain Goal: 6 ([id number])  Complications: No notable events documented.

## 2021-03-10 NOTE — Op Note (Signed)
Preoperative diagnosis: Instability cervical myelopathy and quadriparesis C2-3  Postoperative diagnosis: Same  Procedure: Redo decompressive cervical laminectomy at C2 for placement of bilateral C2 pars interarticularis screws  2.  Posterior cervical fusion C2-C5 utilizing the globus ellipse lateral mass screw system  3.  Posterior lateral arthrodesis C2-C5 utilizing locally harvested autograft mixed with vivigen and BMP  Surgeon: Dominica Severin Merion Grimaldo  Assistant: Nash Shearer  Anesthesia: General  EBL: Minimal  HPI: 60 year old female presented the emergency room with a dense quadriparesis with severe cervical stenosis C2-3.  She initially underwent posterior cervical decompressive laminectomy got significantly improved neurologic exam developed some instability at C2-3 that was treated initially anteriorly with a C2-3 ACDF.  She failed this construct partially due to extensive smoking history and poor bone quality with subsidence of her implants and loosening of her C3 screws.  Due to her failure of anterior cervical fusion I recommended posterior cervical augmentation.  We considered having to anchor to the C1 however the C1-2 interface was intact intact transverse ligament intact ring of C1 and facet joints of C1-2.  The instability was primarily at [facility] and the previous decompressive laminectomy having carried down through 4 so I recommended posterior cervical fusion and in the setting of visualization of an intact C1 to favor the C2-C5 cervical fusion.  We had to incorporate the C5 vertebral body secondary to the decompression carried down just below C4.  Extensive reviewed the risks and benefits of the operation with her as well as perioperative course expectations of outcome and alternatives of surgery and she understood and agreed to proceed forward.  Operative procedure: Patient was brought into the OR was [place] general anesthesia positioned prone in pins posterior aspect of her head neck was  shaved prepped and draped in routine sterile fashion.  Roll incision was opened up the scar tissue was dissected free I exposed the spinal laminar complexes at C5-6 identify the lateral masses C5 and then worked through the scar tissue to reexpose the lateral masses of C2-3 and C4.  I also exposed suboccipital and the ring of C1 for bony landmarks.  The ring of C1 was intact the C1-2 interface was also intact identified the laminotomy defect at C2 extended the C2 laminotomy so I could palpate and visualize the pars of C2.  Then with direct visualization of the medial aspect of the pars of C2 I placed 2 pars screws 16 mm in length under fluoroscopy and both screws had excellent purchase.  We then placed lateral mass screws bilaterally at C3, C4, and C5.  The previous decompression been carried down through C4 so I felt a support cord important to incorporate C5 was also a focal point of her kyphosis.  All lateral mass screws had excellent purchase and then I aggressively decorticated the lateral masses and facet joints and packed extensive mount of imaging autograft and BMP in the facet joints and lateral masses from C2 down to C5.  Then anchored the rods and knots in place and closed the wound in layers with interrupted Vicryl and a running 4 subcuticular.  Dermabond benzoin Steri-Strips and a sterile dressing was applied patient recovery in stable condition.  At the end the case all needle count sponge counts were correct.

## 2021-03-10 NOTE — H&P (Signed)
Margaret Vaughn is an 60 y.o. female.   Chief Complaint: Neck pain weakness arms legs and numbness in her hands HPI: 60 year old female presented with cervical myelopathy quadriparesis and severe stenosis initially underwent posterior cervical decompressive laminectomy C1C2 patient did well recovered a significant amount of strength but still had significant weakness and was nonambulatory patient required anterior stabilization at C2-3 subsequently.  And since then patient has shifted displaced her graft and her plate however it seems to be holding its position there but we need to augment the fusion posteriorly from C1-C4.  As I have extensively gone over the risks and benefits of a posterior cervical redo exposure decompression and fusion from C1-C4.  Have extensively gone over the risks and benefits perioperative course expectations of outcome and alternatives of surgery and she understands and agrees to proceed forward.  Past Medical History:  Diagnosis Date   Acid reflux    Allergies    Anemia    Asthma    Bronchitis    Cervical stenosis of spine    Chronic pain syndrome    COPD (chronic obstructive pulmonary disease) (HCC)    DDD (degenerative disc disease), lumbar    Depression with anxiety    Diabetes mellitus without complication ([place])    Dyspnea    Dysrhythmia    PSVT   Fatigue    H/O blood clots    H/O echocardiogram 1989   Per pt, done @ [place] mitral valve prolapse   Headache(784.0)    Heart murmur    Hemorrhoids, external    History of hiatal hernia    History of kidney stones    History of PSVT (paroxysmal supraventricular tachycardia)    HTN (hypertension)    Hyperlipidemia    Hypoxemia    Insomnia    Memory loss    Myalgia and myositis    Narcolepsy    On home O2    2-4 L @ HS   OSA (obstructive sleep apnea)    Pneumonia    Sleeping difficulty    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[place])    Thoracalgia    Tobacco abuse    Tremor    Ulcer disease      Past Surgical History:  Procedure Laterality Date   ABDOMINAL HYSTERECTOMY     ANTERIOR CERVICAL DECOMP/DISCECTOMY FUSION N/A 12/18/2020   Procedure: CERVICAL TWO-THREE ANTERIOR CERVICAL DISCECTOMY/DECOMPRESSION FUSION;  Surgeon: Kary Kos, MD;  Location: [place];  Service: Neurosurgery;  Laterality: N/A;   APPENDECTOMY  1992   CHOLECYSTECTOMY  1992   DIAGNOSTIC LAPAROSCOPY  1992   lap chole   HERNIA REPAIR     umbilical... as a child   OOPHORECTOMY     POSTERIOR CERVICAL FUSION/FORAMINOTOMY N/A 08/07/2020   Procedure: POSTERIOR CERVICAL LAMINECTOMY FOR MYELOPATHY;  Surgeon: Kary Kos, MD;  Location: [place];  Service: Neurosurgery;  Laterality: N/A;   POSTERIOR CERVICAL LAMINECTOMY  08/07/2020   w/ Dr. Saintclair Halsted   tonsillectomy     TONSILLECTOMY     TUBAL LIGATION      Family History  Problem Relation Age of Onset   Ulcers Mother    Sleep apnea Mother    Migraines Mother    Cancer Mother        uterine   Arthritis Mother    Hypertension Mother    Heart disease Mother    Depression Mother    Hyperlipidemia Mother    Colon cancer Mother    Arrhythmia Son    Social History:  reports that she has been smoking cigarettes. She has a 60.00 pack-year smoking history. She has never used smokeless tobacco. She reports that she does not drink alcohol and does not use drugs.  Allergies:  Allergies  Allergen Reactions   Iohexol Anaphylaxis    PT.GIVEN OMNI 300 FOR CT ANGIO WAS GIVEN 50 MG.BENADRYL PRIOR TO SCAN HAD NO PROBLEMS    Moxifloxacin Anaphylaxis   Shellfish Allergy Hives    Medications Prior to Admission  Medication Sig Dispense Refill   atorvastatin (LIPITOR) 40 MG tablet Take 1 tablet (40 mg total) by mouth daily. 90 tablet 3   baclofen (LIORESAL) 10 MG tablet Take 1 tablet (10 mg total) by mouth 3 (three) times daily as needed for muscle spasms. (Patient taking differently: Take 10 mg by mouth 2 (two) times daily.) 90 tablet 3   cholecalciferol (VITAMIN D3) 25 MCG  (1000 UNIT) tablet Take 1,000 Units by mouth daily.     cyclobenzaprine (FLEXERIL) 10 MG tablet Take 10 mg by mouth 3 (three) times daily.     DULoxetine (CYMBALTA) 30 MG capsule TAKE 1 CAPSULE BY MOUTH EVERY DAY (Patient taking differently: Take 30 mg by mouth daily.) 90 capsule 0   guaiFENesin (MUCINEX) 600 MG 12 hr tablet Take 1,200 mg by mouth 2 (two) times daily.     Multiple Vitamins-Minerals (HAIR SKIN NAILS PO) Take 3 tablets by mouth daily.     Multiple Vitamins-Minerals (MULTIVITAMIN WITH MINERALS) tablet Take 1 tablet by mouth daily.     nicotine (NICODERM CQ - DOSED IN MG/24 HOURS) 21 mg/24hr patch Place 21 mg onto the skin daily as needed (smoking cessation).     nitrofurantoin (MACRODANTIN) 100 MG capsule Take 100 mg by mouth daily.     Omega-3 Fatty Acids (FISH OIL) 1000 MG CAPS Take 4,000 mg by mouth daily.     oxyCODONE-acetaminophen (PERCOCET) 10-325 MG tablet Take 1 tablet by mouth 4 (four) times daily as needed for pain.     pregabalin (LYRICA) 150 MG capsule Take 1 capsule (150 mg total) by mouth 3 (three) times daily. 90 capsule 2   PROAIR HFA 108 (90 Base) MCG/ACT inhaler TAKE 2 PUFFS BY MOUTH EVERY 6 HOURS AS NEEDED FOR WHEEZE OR SHORTNESS OF BREATH (Patient taking differently: Inhale 2 puffs into the lungs every 6 (six) hours as needed for shortness of breath or wheezing.) 8.5 each 3   rizatriptan (MAXALT-MLT) 10 MG disintegrating tablet Take 1 tablet (10 mg total) by mouth as needed for migraine. May repeat in 2 hours if needed 9 tablet 11   sitaGLIPtin-metformin (JANUMET) 50-1000 MG tablet Take 1 tablet by mouth 2 (two) times daily with a meal. 180 tablet 3   tiotropium (SPIRIVA HANDIHALER) 18 MCG inhalation capsule INHALE CONTENTS OF 1 CAPSULE VIA HANDIHALER ONCE DAILY AT SAME TIME EVERY DAY (Patient taking differently: Place 18 mcg into inhaler and inhale daily.) 90 capsule 3   WIXELA INHUB 100-50 MCG/ACT AEPB INHALE 1 PUFF INTO THE LUNGS TWICE A DAY (Patient taking  differently: Inhale 1 puff into the lungs 2 (two) times daily.) 60 each 1   XTAMPZA ER 13.5 MG C12A Take 13.5 mg by mouth 2 (two) times daily.     enalapril (VASOTEC) 5 MG tablet Take 1 tablet (5 mg total) by mouth daily. (Patient not taking: Reported on 02/27/2021) 90 tablet 3   esomeprazole (NEXIUM) 40 MG capsule TAKE 1 CAPSULE BY MOUTH EVERY DAY (Patient not taking: No sig reported) 90 capsule 3  methocarbamol (ROBAXIN) 500 MG tablet Take 1 tablet (500 mg total) by mouth 4 (four) times daily. (Patient not taking: No sig reported) 45 tablet 0   oxyCODONE-acetaminophen (PERCOCET) 5-325 MG tablet Take 1 tablet by mouth every 4 (four) hours as needed for severe pain. (Patient not taking: No sig reported) 20 tablet 0    Results for orders placed or performed during the hospital encounter of 03/10/21 (from the past 48 hour(s))  Glucose, capillary     Status: None   Collection Time: 03/10/21  5:50 AM  Result Value Ref Range   Glucose-Capillary 97 70 - 99 mg/dL    Comment: Glucose reference range applies only to samples taken after fasting for at least 8 hours.   Comment 1 Notify RN    No results found.  Review of Systems  Musculoskeletal:  Positive for neck pain.  Neurological:  Positive for weakness and numbness.   Blood pressure (!) 142/90, pulse (!) 102, temperature 98.1 F (36.7 C), temperature source Oral, resp. rate 20, height '5\' 4"'$  (1.626 m), weight 63.5 kg, SpO2 92 %. Physical Exam HENT:     Head: Normocephalic.     Nose: Nose normal.     Mouth/Throat:     Mouth: Mucous membranes are moist.  Eyes:     Pupils: Pupils are equal, round, and reactive to light.  Cardiovascular:     Rate and Rhythm: Normal rate.  Pulmonary:     Effort: Pulmonary effort is normal.  Abdominal:     General: Abdomen is flat.  Musculoskeletal:        General: Normal range of motion.  Skin:    General: Skin is warm.  Neurological:     General: No focal deficit present.     Mental Status: She is  alert.     Assessment/Plan 60 year old presents for C1-C4 fusion  Elaina Hoops, MD 03/10/2021, 7:20 AM

## 2021-03-10 NOTE — Anesthesia Postprocedure Evaluation (Signed)
Anesthesia Post Note  Patient: Margaret Vaughn  Procedure(s) Performed: Posterior cervical fusion with lateral mass fixation Cervical one - cervical four     Patient location during evaluation: PACU Anesthesia Type: General Level of consciousness: awake and alert Pain management: pain level controlled Vital Signs Assessment: post-procedure vital signs reviewed and stable Respiratory status: spontaneous breathing, nonlabored ventilation, respiratory function stable and patient connected to nasal cannula oxygen Cardiovascular status: blood pressure returned to baseline and stable Postop Assessment: no apparent nausea or vomiting Anesthetic complications: no   No notable events documented.  Last Vitals:  Vitals:   03/10/21 1245 03/10/21 1300  BP: (!) 149/85 (!) 147/98  Pulse: (!) 105 97  Resp: 19 14  Temp:    SpO2: 96% 94%    Last Pain:  Vitals:   03/10/21 1300  TempSrc:   PainSc: [place]

## 2021-03-10 NOTE — Progress Notes (Signed)
Orthopedic Tech Progress Note Patient Details:  [name] 15-Aug-1960 [id number]  RN stated " patient has on HARD COLLAR"   Patient ID: Margaret Vaughn, female   DOB: 04/28/1961, 60 y.o.   MRN: [id number]  Janit Pagan 03/10/2021, 3:08 PM

## 2021-03-10 NOTE — Anesthesia Procedure Notes (Signed)
Procedure Name: Intubation Date/Time: 03/10/2021 7:40 AM Performed by: Harden Mo, CRNA Pre-anesthesia Checklist: Patient identified, Emergency Drugs available, Suction available and Patient being monitored Patient Re-evaluated:Patient Re-evaluated prior to induction Oxygen Delivery Method: Circle System Utilized Preoxygenation: Pre-oxygenation with 100% oxygen Induction Type: IV induction Ventilation: Mask ventilation without difficulty and Oral airway inserted - appropriate to patient size Laryngoscope Size: Glidescope and 3 Grade View: Grade I Tube type: Oral Tube size: 7.5 mm Number of attempts: 1 Airway Equipment and Method: Stylet and Oral airway Placement Confirmation: ETT inserted through vocal cords under direct vision, positive ETCO2 and breath sounds checked- equal and bilateral Secured at: 22 cm Tube secured with: Tape Dental Injury: Teeth and Oropharynx as per pre-operative assessment

## 2021-03-11 ENCOUNTER — Encounter (HOSPITAL_COMMUNITY): Payer: Self-pay | Admitting: Neurosurgery

## 2021-03-11 MED ORDER — PANTOPRAZOLE SODIUM 40 MG PO TBEC
40.0000 mg | DELAYED_RELEASE_TABLET | Freq: Every day | ORAL | Status: DC
  Administered 2021-03-11 – 2021-03-13 (×3): 40 mg via ORAL
  Filled 2021-03-11 (×3): qty 1

## 2021-03-11 NOTE — Evaluation (Signed)
Physical Therapy Evaluation Patient Details Name: Margaret Vaughn MRN: [id number] DOB: [birth year] Today's Date: 03/11/2021   History of Present Illness  60 year old female s/p redo decompressive cervical laminectomy at C2 for placement of bilateral C2 pars interarticularis screws. posterior cervical fusion C2-C5, posterior lateral arthrodesis C2-C5. PHMx: cervical myelopathy quadriparesis and severe stenosis initially underwent posterior cervical decompressive laminectomy C1-C2 patient did well recovered a significant amount of strength but still had significant weakness and was nonambulatory patient required anterior stabilization at C2-3 subsequently.  Since then patient has shifted displaced her graft and her plate however it seems to be holding its position there but we need to augment the fusion posteriorly from C1-C4.DM,depression, anxiety, chronic pain,HTN, memory loss, home O2.  Clinical Impression  PTA, pt lives with her spouse, was independent with ADL's and mobility with no assistive device. Pt premedicated 45 minutes prior to session. Requiring min assist to sit up to edge of bed. Once sitting, pt with significant increase in pain; reporting "sharp, shooting, burning," pain radiating from neck down to back. Pt requesting to return to sidelying position. Ice applied, provided positioning with pillows, and RN present to provide IV pain medication. Suspect pt will progress given appropriate pain control and management. Will continue to reassess.    Follow Up Recommendations Home health PT;Supervision/Assistance - 24 hour    Equipment Recommendations  None recommended by PT (pt well equipped)    Recommendations for Other Services       Precautions / Restrictions Precautions Precautions: Cervical;Fall Precaution Booklet Issued: Yes (comment) Precaution Comments: on home O2 Required Braces or Orthoses: Cervical Brace Cervical Brace: Hard collar;At all times Restrictions Weight  Bearing Restrictions: No      Mobility  Bed Mobility Overal bed mobility: Needs Assistance Bed Mobility: Rolling;Sidelying to Sit;Sit to Sidelying Rolling: Min guard Sidelying to sit: Min assist     Sit to sidelying: Min assist General bed mobility comments: Cues for log roll technique, light minA at trunk for support to achieve upright. minA for guidance back down to sidelying    Transfers                 General transfer comment: unable due to increase in pain when pt sat up on EOB  Ambulation/Gait                Stairs            Wheelchair Mobility    Modified Rankin (Stroke Patients Only)       Balance Overall balance assessment: Needs assistance Sitting-balance support: Bilateral upper extremity supported;Feet supported Sitting balance-Leahy Scale: Poor Sitting balance - Comments: UEs for support                                     Pertinent Vitals/Pain Pain Assessment: Faces Pain Score: 10-Worst pain ever Faces Pain Scale: Hurts worst Pain Location: neck and shoulders Pain Descriptors / Indicators: Aching;Burning;Sore;Crying Pain Intervention(s): Limited activity within patient's tolerance;Monitored during session;Premedicated before session;Repositioned;Patient requesting pain meds-RN notified;Ice applied    Home Living Family/patient expects to be discharged to:: Private residence Living Arrangements: Spouse/significant other;Children Available Help at Discharge: Family;Available PRN/intermittently Type of Home: Mobile home Home Access: Stairs to enter Entrance Stairs-Rails: None Entrance Stairs-Number of Steps: 1 Home Layout: One level Home Equipment: Walker - 4 wheels;Bedside commode;Shower seat;Hand held shower head;Wheelchair - [employer] - 2 wheels Additional Comments: 3 dogs 1 cat  Prior Function Level of Independence: Independent               Hand Dominance   Dominant Hand: Right     Extremity/Trunk Assessment   Upper Extremity Assessment Upper Extremity Assessment: Defer to OT evaluation    Lower Extremity Assessment Lower Extremity Assessment: Overall WFL for tasks assessed    Cervical / Trunk Assessment Cervical / Trunk Assessment: Other exceptions Cervical / Trunk Exceptions: s/p cx fusion  Communication   Communication: No difficulties  Cognition Arousal/Alertness: Awake/alert Behavior During Therapy: WFL for tasks assessed/performed Overall Cognitive Status: Within Functional Limits for tasks assessed                                 General Comments: Internally distracted by pain      General Comments      Exercises     Assessment/Plan    PT Assessment Patient needs continued PT services  PT Problem List Decreased strength;Decreased activity tolerance;Decreased balance;Decreased mobility;Pain       PT Treatment Interventions Gait training;DME instruction;Stair training;Functional mobility training;Therapeutic exercise;Therapeutic activities;Balance training;Patient/family education    PT Goals (Current goals can be found in the Care Plan section)  Acute Rehab PT Goals Patient Stated Goal: to not be in so much pain PT Goal Formulation: With patient Time For Goal Achievement: 03/25/21 Potential to Achieve Goals: Good    Frequency Min 5X/week   Barriers to discharge        Co-evaluation PT/OT/SLP Co-Evaluation/Treatment: Yes Reason for Co-Treatment: For patient/therapist safety;To address functional/ADL transfers;Other (comment) (pain/activity tolerance) PT goals addressed during session: Mobility/safety with mobility         AM-PAC PT "6 Clicks" Mobility  Outcome Measure Help needed turning from your back to your side while in a flat bed without using bedrails?: A Little Help needed moving from lying on your back to sitting on the side of a flat bed without using bedrails?: A Little Help needed moving to and from a  bed to a chair (including a wheelchair)?: A Little Help needed standing up from a chair using your arms (e.g., wheelchair or bedside chair)?: A Little Help needed to walk in hospital room?: A Little Help needed climbing 3-5 steps with a railing? : A Lot 6 Click Score: 17    End of Session Equipment Utilized During Treatment: Oxygen;Cervical collar Activity Tolerance: Patient limited by pain Patient left: in bed;with call bell/phone within reach;with family/visitor present Nurse Communication: Mobility status;Patient requests pain meds PT Visit Diagnosis: Difficulty in walking, not elsewhere classified (R26.2);Pain Pain - part of body:  (neck)    Time: [id number] PT Time Calculation (min) (ACUTE ONLY): 23 min   Charges:   PT Evaluation $PT Eval Moderate Complexity: 1 Mod          Wyona Almas, PT, DPT Acute Rehabilitation Services Pager [phone] Office [phone]   Deno Etienne 03/11/2021, 9:55 AM

## 2021-03-11 NOTE — Evaluation (Signed)
Occupational Therapy Evaluation Patient Details Name: Margaret Vaughn MRN: [id number] DOB: [birth year] Today's Date: 03/11/2021    History of Present Illness 60 year old female s/p redo decompressive cervical laminectomy at C2 for placement of bilateral C2 pars interarticularis screws. posterior cervical fusion C2-C5, posterior lateral arthrodesis C2-C5. PHMx: cervical myelopathy quadriparesis and severe stenosis initially underwent posterior cervical decompressive laminectomy C1-C2 patient did well recovered a significant amount of strength but still had significant weakness and was nonambulatory patient required anterior stabilization at C2-3 subsequently.  Since then patient has shifted displaced her graft and her plate however it seems to be holding its position there but we need to augment the fusion posteriorly from C1-C4.DM,depression, anxiety, chronic pain,HTN, memory loss, home O2.   Clinical Impression   This 60 yo female admitted and underwent above presents to acute OT with PLOF of being able to do all of her own basic ADLs by herself without AD. Currently she is total A for all basic ADLs at bed level due to pain in bed (shoulders and neck) and increased pain (despite pre-medicated)upon sitting up on EOB. She will continue to benefit from acute OT with follow up [place] post D/C.    Follow Up Recommendations  Home health OT;Supervision/Assistance - 24 hour    Equipment Recommendations  None recommended by OT       Precautions / Restrictions Precautions Precautions: Cervical Precaution Booklet Issued: Yes (comment) Required Braces or Orthoses: Cervical Brace Cervical Brace: Hard collar;At all times      Mobility Bed Mobility Overal bed mobility: Needs Assistance Bed Mobility: Rolling;Sidelying to Sit;Sit to Sidelying Rolling: Min guard Sidelying to sit: Min assist (A for trunk)     Sit to sidelying: Min assist (A for trunk)      Transfers                  General transfer comment: unable due to increase in pain when pt sat up on EOB    Balance Overall balance assessment: Needs assistance Sitting-balance support: Bilateral upper extremity supported;Feet supported Sitting balance-Leahy Scale: Poor Sitting balance - Comments: UEs for support                                   ADL either performed or assessed with clinical judgement   ADL Overall ADL's : Needs assistance/impaired                                       General ADL Comments: Currently total A for all at bed level due to neck and shoulder pain     Vision Patient Visual Report: No change from baseline              Pertinent Vitals/Pain Pain Assessment: 0-10 Pain Score: 10-Worst pain ever Pain Location: neck and shoulders Pain Descriptors / Indicators: Aching;Burning;Sore;Crying Pain Intervention(s): Limited activity within patient's tolerance;Monitored during session;Premedicated before session;Repositioned;Patient requesting pain meds-RN notified;RN gave pain meds during session;Ice applied     Hand Dominance Right   Extremity/Trunk Assessment Upper Extremity Assessment Upper Extremity Assessment:  (overall WFL, but cannot raise arms up at shoulders due to pain and does report numbness in hands (not new))           Communication Communication Communication: No difficulties   Cognition Arousal/Alertness: Awake/alert Behavior During Therapy: WFL for tasks assessed/performed Overall Cognitive  Status: Within Functional Limits for tasks assessed                                                Home Living Family/patient expects to be discharged to:: Private residence Living Arrangements: Spouse/significant other;Children Available Help at Discharge: Family;Available PRN/intermittently (husband says someone can be with her 24/7 for a week.)   Home Access: Stairs to enter Entrance Stairs-Number of Steps:  1 Entrance Stairs-Rails: None Home Layout: One level     Bathroom Shower/Tub: Tub/shower unit;Curtain   Biochemist, clinical: Standard     Home Equipment: Environmental consultant - 4 wheels;Bedside commode;Shower seat;Hand held shower head;Wheelchair - [employer] - 2 wheels          Prior Functioning/Environment Level of Independence: Independent                 OT Problem List: Decreased range of motion;Impaired balance (sitting and/or standing);Decreased activity tolerance;Decreased knowledge of precautions;Pain      OT Treatment/Interventions: Self-care/ADL training;DME and/or AE instruction;Patient/family education;Balance training    OT Goals(Current goals can be found in the care plan section) Acute Rehab OT Goals Patient Stated Goal: to not be in so much pain OT Goal Formulation: With patient Time For Goal Achievement: 03/25/21 Potential to Achieve Goals: Good  OT Frequency: Min 2X/week    AM-PAC OT "6 Clicks" Daily Activity     Outcome Measure Help from another person eating meals?: Total Help from another person taking care of personal grooming?: Total Help from another person toileting, which includes using toliet, bedpan, or urinal?: Total Help from another person bathing (including washing, rinsing, drying)?: Total Help from another person to put on and taking off regular upper body clothing?: Total Help from another person to put on and taking off regular lower body clothing?: Total 6 Click Score: 6   End of Session Nurse Communication:  (need for additional pain meds if able)  Activity Tolerance: Patient limited by pain Patient left: in bed;with call bell/phone within reach;with family/visitor present  OT Visit Diagnosis: Other abnormalities of gait and mobility (R26.89);Muscle weakness (generalized) (M62.81);Pain Pain - part of body:  (neck and shoulders)                Time: [id number] OT Time Calculation (min): 23 min Charges:  OT General Charges $OT Visit: 1  Visit OT Evaluation $OT Eval Moderate Complexity: 1 Mod  [place]en Circle, OTR/L Acute [employer] Pager [phone] Office [phone]    Almon Register 03/11/2021, 9:38 AM

## 2021-03-11 NOTE — Progress Notes (Signed)
Subjective: Patient reports a lot of neck pain and shoulder blade pain.   Objective: Vital signs in last 24 hours: Temp:  [97.6 F (36.4 C)-99.1 F (37.3 C)] 97.6 F (36.4 C) (08/16 0733) Pulse Rate:  [94-108] 99 (08/16 0733) Resp:  [12-20] 18 (08/16 0733) BP: (143-158)/(82-100) 152/100 (08/16 0733) SpO2:  [93 %-100 %] 94 % (08/16 0733)  Intake/Output from previous day: 08/15 0701 - 08/16 0700 In: 2303 [P.O.:50; I.V.:1803; IV Piggyback:400] Out: [id number] [Urine:5150; Drains:50; Blood:125] Intake/Output this shift: No intake/output data recorded.  Neurologic: Grossly normal  Lab Results: Lab Results  Component Value Date   WBC 9.3 03/06/2021   HGB 14.4 03/06/2021   HCT 44.2 03/06/2021   MCV 90.4 03/06/2021   PLT 329 03/06/2021   No results found for: INR, PROTIME BMET Lab Results  Component Value Date   NA 137 03/06/2021   K 3.7 03/06/2021   CL 100 03/06/2021   CO2 30 03/06/2021   GLUCOSE 92 03/06/2021   BUN <5 (L) 03/06/2021   CREATININE 0.49 03/06/2021   CALCIUM 9.2 03/06/2021    Studies/Results: DG Cervical Spine 2 or 3 views  Result Date: 03/10/2021 CLINICAL DATA:  Surgery, elective Z41.9 (ICD-10-CM). Additional history provided: Posterior cervical fusion with lateral mass fixation cervical 1-cervical 4. Provided fluoroscopy time 44 seconds (8.19 mGy). EXAM: CERVICAL SPINE - 2-3 VIEW; DG C-ARM 1-60 MIN COMPARISON:  Intra procedural fluoroscopic images of the cervical spine 12/18/2020. Radiographs of the cervical spine 02/18/2021 (images available, report unavailable). CT of the cervical spine 01/10/2021. FINDINGS: PA and lateral view intraoperative fluoroscopic images of the cervical spine are submitted, 2 images total. On the provided images, there are bilateral lateral mass screws at what appear to be the C2, C3, C4 and C5 levels. Overlying retractors. Redemonstrated ACDF hardware with hardware loosening at C2-C3 level. Redemonstrated C2-C3 grade 1 anterolisthesis.  IMPRESSION: Two intraoperative fluoroscopic images of the cervical spine, as described. Correlate with the procedural history. Electronically Signed   By: Kellie Simmering D.O.   On: 03/10/2021 10:40   DG C-Arm 1-60 Min  Result Date: 03/10/2021 CLINICAL DATA:  Surgery, elective Z41.9 (ICD-10-CM). Additional history provided: Posterior cervical fusion with lateral mass fixation cervical 1-cervical 4. Provided fluoroscopy time 44 seconds (8.19 mGy). EXAM: CERVICAL SPINE - 2-3 VIEW; DG C-ARM 1-60 MIN COMPARISON:  Intra procedural fluoroscopic images of the cervical spine 12/18/2020. Radiographs of the cervical spine 02/18/2021 (images available, report unavailable). CT of the cervical spine 01/10/2021. FINDINGS: PA and lateral view intraoperative fluoroscopic images of the cervical spine are submitted, 2 images total. On the provided images, there are bilateral lateral mass screws at what appear to be the C2, C3, C4 and C5 levels. Overlying retractors. Redemonstrated ACDF hardware with hardware loosening at C2-C3 level. Redemonstrated C2-C3 grade 1 anterolisthesis. IMPRESSION: Two intraoperative fluoroscopic images of the cervical spine, as described. Correlate with the procedural history. Electronically Signed   By: Kellie Simmering D.O.   On: 03/10/2021 10:40    Assessment/Plan: 60 year old female postop day 1 posterior cervical fusion C2-C5. Work on pain management today. OOB and work with therapies today.    LOS: 1 day    Margaret Vaughn 03/11/2021, 8:28 AM

## 2021-03-12 NOTE — Progress Notes (Signed)
Occupational Therapy Treatment Patient Details Name: Margaret Vaughn MRN: [id number] DOB: January 28, 1961 Today's Date: 03/12/2021    History of present illness 60 year old female s/p redo decompressive cervical laminectomy at C2 for placement of bilateral C2 pars interarticularis screws. posterior cervical fusion C2-C5, posterior lateral arthrodesis C2-C5. PHMx: cervical myelopathy quadriparesis and severe stenosis initially underwent posterior cervical decompressive laminectomy C1-C2 patient did well recovered a significant amount of strength but still had significant weakness and was nonambulatory patient required anterior stabilization at C2-3 subsequently.  Since then patient has shifted displaced her graft and her plate however it seems to be holding its position there but we need to augment the fusion posteriorly from C1-C4.DM,depression, anxiety, chronic pain,HTN, memory loss, home O2.   OT comments  Pt progressing well towards acute OT goals. Pain remains significant but today she was able to complete all OOB tasks presented, premedicated. Family present during session. Pt completed functional mobility and transfers with min guard assist. Plan to assess LB ADL AE needs next session. D/c plan updated to OP OT; pt and family in agreement with this.   Follow Up Recommendations  Outpatient OT;Supervision/Assistance - 24 hour    Equipment Recommendations  None recommended by OT    Recommendations for Other Services      Precautions / Restrictions Precautions Precautions: Cervical;Fall Precaution Booklet Issued: Yes (comment) Precaution Comments: on home O2 Required Braces or Orthoses: Cervical Brace Cervical Brace: Hard collar;At all times Restrictions Weight Bearing Restrictions: No       Mobility Bed Mobility                    Transfers Overall transfer level: Needs assistance Equipment used: Rolling walker (2 wheeled) Transfers: Sit to/from Stand Sit to Stand: Min  guard         General transfer comment: min guard for safety. Cues for technique with rw needed.    Balance Overall balance assessment: Needs assistance Sitting-balance support: Bilateral upper extremity supported;Feet supported Sitting balance-Leahy Scale: Fair Sitting balance - Comments: UEs for comfort>support today   Standing balance support: Bilateral upper extremity supported Standing balance-Leahy Scale: Fair Standing balance comment: seeks external support during dynamic tasks                           ADL either performed or assessed with clinical judgement   ADL Overall ADL's : Needs assistance/impaired Eating/Feeding: Set up;Sitting Eating/Feeding Details (indicate cue type and reason): pt reports she is now able to hold regular utensils without difficulty                     Toilet Transfer: Min guard;Ambulation;Comfort height toilet;Grab bars;RW           Functional mobility during ADLs: Min guard;Rolling walker General ADL Comments: Pt much improved today in terms of functional performance. Pain levels remain high, currently needs to be premedicated to tolerate OOB activity. Able to walk with no physical, hands on assist today, Up in recliner at end of session.     Vision       Perception     Praxis      Cognition Arousal/Alertness: Awake/alert Behavior During Therapy: WFL for tasks assessed/performed Overall Cognitive Status: Within Functional Limits for tasks assessed  Exercises     Shoulder Instructions       General Comments Family present during session.    Pertinent Vitals/ Pain       Pain Assessment: Faces Faces Pain Scale: Hurts even more Pain Location: neck and shoulders Pain Descriptors / Indicators: Grimacing;Guarding;Aching;Sore Pain Intervention(s): Monitored during session;Premedicated before session;Repositioned;Limited activity within patient's  tolerance  Home Living                                          Prior Functioning/Environment              Frequency  Min 2X/week        Progress Toward Goals  OT Goals(current goals can now be found in the care plan section)  Progress towards OT goals: Progressing toward goals  Acute Rehab OT Goals Patient Stated Goal: to not be in so much pain OT Goal Formulation: With patient Time For Goal Achievement: 03/25/21 Potential to Achieve Goals: Good ADL Goals Pt Will Perform Grooming: with set-up;with supervision;standing Pt Will Perform Upper Body Bathing: with set-up;with supervision;sitting Pt Will Perform Lower Body Bathing: with set-up;with supervision;with adaptive equipment;sit to/from stand Pt Will Perform Upper Body Dressing: with set-up;with supervision;sitting Pt Will Perform Lower Body Dressing: with set-up;with supervision;sit to/from stand;with adaptive equipment Pt Will Transfer to Toilet: with supervision;ambulating;bedside commode Pt Will Perform Toileting - Clothing Manipulation and hygiene: with supervision;sit to/from stand Additional ADL Goal #1: Pt will be S in and OOB for basic ADLs Additional ADL Goal #2: Pt will be able to verbalize wear and care or cervical collar  Plan Discharge plan needs to be updated    Co-evaluation                 AM-PAC OT "6 Clicks" Daily Activity     Outcome Measure   Help from another person eating meals?: A Little Help from another person taking care of personal grooming?: A Little Help from another person toileting, which includes using toliet, bedpan, or urinal?: A Little Help from another person bathing (including washing, rinsing, drying)?: A Little Help from another person to put on and taking off regular upper body clothing?: A Little Help from another person to put on and taking off regular lower body clothing?: A Little 6 Click Score: 18    End of Session Equipment Utilized  During Treatment: Cervical collar;Rolling walker;Oxygen;Gait belt  OT Visit Diagnosis: Other abnormalities of gait and mobility (R26.89);Muscle weakness (generalized) (M62.81);Pain   Activity Tolerance Patient limited by pain;Patient tolerated treatment well   Patient Left in chair;with call bell/phone within reach;with family/visitor present   Nurse Communication          Time: 0944-1000 OT Time Calculation (min): 16 min  Charges: OT General Charges $OT Visit: 1 Visit OT Treatments $Self Care/Home Management : 8-22 mins  Tyrone Schimke, OT Acute Rehabilitation Services Pager: [phone] Office: [phone]    Hortencia Pilar 03/12/2021, 12:07 PM

## 2021-03-12 NOTE — Progress Notes (Signed)
Subjective: Patient reports neck pain that has not improved much. Sitting up in the chair and has already ambulated around the unit today.   Objective: Vital signs in last 24 hours: Temp:  [97.1 F (36.2 C)-99.1 F (37.3 C)] 98.7 F (37.1 C) (08/17 0730) Pulse Rate:  [96-109] 103 (08/17 0730) Resp:  [15-20] 15 (08/17 0730) BP: (125-162)/(75-95) 128/75 (08/17 0730) SpO2:  [92 %-100 %] 93 % (08/17 0730)  Intake/Output from previous day: 08/16 0701 - 08/17 0700 In: 303 [P.O.:300; I.V.:3] Out: 2890 [Urine:2800; Drains:90] Intake/Output this shift: Total I/O In: 300 [P.O.:300] Out: 1000 [Urine:1000]  Neurologic: Grossly normal  Lab Results: Lab Results  Component Value Date   WBC 9.3 03/06/2021   HGB 14.4 03/06/2021   HCT 44.2 03/06/2021   MCV 90.4 03/06/2021   PLT 329 03/06/2021   No results found for: INR, PROTIME BMET Lab Results  Component Value Date   NA 137 03/06/2021   K 3.7 03/06/2021   CL 100 03/06/2021   CO2 30 03/06/2021   GLUCOSE 92 03/06/2021   BUN <5 (L) 03/06/2021   CREATININE 0.49 03/06/2021   CALCIUM 9.2 03/06/2021    Studies/Results: No results found.  Assessment/Plan: 60 year old female postop day 2 posterior cervical fusion C2-C5. Pain management and therapy today. Still requiring IV pain meds.    LOS: 2 days    Ocie Cornfield [facility] 03/12/2021, 11:28 AM

## 2021-03-12 NOTE — Progress Notes (Signed)
Physical Therapy Treatment Patient Details Name: Margaret Vaughn MRN: [id number] DOB: Jun 07, 1961 Today's Date: 03/12/2021    History of Present Illness 60 year old female s/p redo decompressive cervical laminectomy at C2 for placement of bilateral C2 pars interarticularis screws. posterior cervical fusion C2-C5, posterior lateral arthrodesis C2-C5. PHMx: cervical myelopathy quadriparesis and severe stenosis initially underwent posterior cervical decompressive laminectomy C1-C2 patient did well recovered a significant amount of strength but still had significant weakness and was nonambulatory patient required anterior stabilization at C2-3 subsequently.  Since then patient has shifted displaced her graft and her plate however it seems to be holding its position there but we need to augment the fusion posteriorly from C1-C4.DM,depression, anxiety, chronic pain,HTN, memory loss, home O2.    PT Comments    Pt making steady progress towards her physical therapy goals today; premedicated with IV pain medication. Ambulating 200 feet with a walker at a min guard assist. SpO2 95% on 3L O2 (pt on home O2). Reviewed postural re-education exercises and cervical precautions. Suspect continued progress given adequate pain control and management. Updated d/c plan    Follow Up Recommendations  Outpatient PT;Supervision for mobility/OOB     Equipment Recommendations  None recommended by PT    Recommendations for Other Services       Precautions / Restrictions Precautions Precautions: Cervical;Fall Precaution Booklet Issued: Yes (comment) Precaution Comments: on home O2 Required Braces or Orthoses: Cervical Brace Cervical Brace: Hard collar;At all times Restrictions Weight Bearing Restrictions: No    Mobility  Bed Mobility Overal bed mobility: Needs Assistance Bed Mobility: Rolling;Sidelying to Sit Rolling: Supervision Sidelying to sit: Min guard            Transfers Overall transfer  level: Needs assistance Equipment used: Rolling walker (2 wheeled) Transfers: Sit to/from Stand Sit to Stand: Min guard         General transfer comment: min guard for safety. Cues for technique with rw needed.  Ambulation/Gait Ambulation/Gait assistance: Min guard Gait Distance (Feet): 200 Feet Assistive device: Rolling walker (2 wheeled) Gait Pattern/deviations: Step-through pattern;Decreased stride length     General Gait Details: Slow and steady pace, min guard for safety, no gross instability noted   Stairs             Wheelchair Mobility    Modified Rankin (Stroke Patients Only)       Balance Overall balance assessment: Needs assistance Sitting-balance support: Bilateral upper extremity supported;Feet supported Sitting balance-Leahy Scale: Fair Sitting balance - Comments: UEs for comfort>support today   Standing balance support: Bilateral upper extremity supported Standing balance-Leahy Scale: Fair Standing balance comment: seeks external support during dynamic tasks                            Cognition Arousal/Alertness: Awake/alert Behavior During Therapy: WFL for tasks assessed/performed Overall Cognitive Status: Within Functional Limits for tasks assessed                                        Exercises Other Exercises Other Exercises: Sitting: scapular retractions x 5    General Comments General comments (skin integrity, edema, etc.): Family present during session.      Pertinent Vitals/Pain Pain Assessment: Faces Faces Pain Scale: Hurts even more Pain Location: neck and shoulders Pain Descriptors / Indicators: Grimacing;Guarding;Aching;Sore Pain Intervention(s): Limited activity within patient's tolerance;Monitored during session;Premedicated before session;Ice applied  Home Living                      Prior Function            PT Goals (current goals can now be found in the care plan section)  Acute Rehab PT Goals Patient Stated Goal: to not be in so much pain Potential to Achieve Goals: Good Progress towards PT goals: Progressing toward goals    Frequency    Min 5X/week      PT Plan Discharge plan needs to be updated    Co-evaluation              AM-PAC PT "6 Clicks" Mobility   Outcome Measure  Help needed turning from your back to your side while in a flat bed without using bedrails?: A Little Help needed moving from lying on your back to sitting on the side of a flat bed without using bedrails?: A Little Help needed moving to and from a bed to a chair (including a wheelchair)?: A Little Help needed standing up from a chair using your arms (e.g., wheelchair or bedside chair)?: A Little Help needed to walk in hospital room?: A Little Help needed climbing 3-5 steps with a railing? : A Little 6 Click Score: 18    End of Session Equipment Utilized During Treatment: Oxygen;Cervical collar;Gait belt Activity Tolerance: Patient tolerated treatment well Patient left: in chair;with call bell/phone within reach;with chair alarm set;with family/visitor present Nurse Communication: Mobility status PT Visit Diagnosis: Difficulty in walking, not elsewhere classified (R26.2);Pain     Time: [id number] PT Time Calculation (min) (ACUTE ONLY): 15 min  Charges:  $Therapeutic Activity: 8-22 mins                     Wyona Almas, PT, DPT Acute Rehabilitation Services Pager [phone] Office [phone]    Deno Etienne 03/12/2021, 12:20 PM

## 2021-03-13 MED ORDER — DIPHENHYDRAMINE-ZINC ACETATE 2-0.1 % EX CREA
TOPICAL_CREAM | Freq: Every day | CUTANEOUS | Status: DC | PRN
  Filled 2021-03-13: qty 28

## 2021-03-13 MED ORDER — NYSTATIN 100000 UNIT/ML MT SUSP
5.0000 mL | Freq: Four times a day (QID) | OROMUCOSAL | Status: DC
  Administered 2021-03-13 – 2021-03-14 (×5): 500000 [IU] via ORAL
  Filled 2021-03-13 (×5): qty 5

## 2021-03-13 NOTE — Progress Notes (Signed)
Subjective: Patient reports  doing better with better pain management today  Objective: Vital signs in last 24 hours: Temp:  [98 F (36.7 C)-99.4 F (37.4 C)] 98.1 F (36.7 C) (08/18 0349) Pulse Rate:  [99-108] 108 (08/18 0442) Resp:  [16-20] 20 (08/18 0349) BP: (113-129)/(61-94) 120/94 (08/18 0349) SpO2:  [92 %-96 %] 94 % (08/18 0442)  Intake/Output from previous day: 08/17 0701 - 08/18 0700 In: 300 [P.O.:300] Out: 2150 [Urine:2150] Intake/Output this shift: No intake/output data recorded.  Awake and alert strength at baseline incision clean dry and intact  Lab Results: No results for input(s): WBC, HGB, HCT, PLT in the last 72 hours. BMET No results for input(s): NA, K, CL, CO2, GLUCOSE, BUN, CREATININE, CALCIUM in the last 72 hours.  Studies/Results: No results found.  Assessment/Plan: Postop day 3 posterior cervical fusion doing fairly well will DC Hemovac mobilize with physical Occupational Therapy  LOS: 3 days     Elaina Hoops 03/13/2021, 7:43 AM

## 2021-03-13 NOTE — Progress Notes (Signed)
Pt brought Aspen collar from home. Would like new pads.  Called [facility] and requested new pads.  They will contact outside vendor.

## 2021-03-13 NOTE — Progress Notes (Signed)
Physical Therapy Treatment Patient Details Name: Margaret Vaughn MRN: [id number] DOB: 1960-09-25 Today's Date: 03/13/2021    History of Present Illness 60 year old female s/p redo decompressive cervical laminectomy at C2 for placement of bilateral C2 pars interarticularis screws. posterior cervical fusion C2-C5, posterior lateral arthrodesis C2-C5. PHMx: cervical myelopathy quadriparesis and severe stenosis initially underwent posterior cervical decompressive laminectomy C1-C2 patient did well recovered a significant amount of strength but still had significant weakness and was nonambulatory patient required anterior stabilization at C2-3 subsequently.  Since then patient has shifted displaced her graft and her plate however it seems to be holding its position there but we need to augment the fusion posteriorly from C1-C4.DM,depression, anxiety, chronic pain,HTN, memory loss, home O2.    PT Comments    The pt was OOB with bed alarm going off upon arrival of PT. The pt was able to safely complete short bout of ambulation to the bathroom with use of RW, however, the pt began reporting fatigue, and SpO2 was noted to be low of 78% as the pt had removed her O2 to ambulate to the bathroom. Once 4LO2 replaced, the pt recovered to 90s with 5 min sitting EOB with guided breathing. The pt reports nausea at this time, and reports she is unable to progress ambulation at this time due to nausea and need for pain medicine (RN notified). The pt and her family were educated on importance of maintaining supplemental O2 to maintain SpO2 > 90%, even when pt feeling she needs to mobilize to bathroom urgently. The pt's spouse verbalized understanding. The pt will continue to benefit from skilled PT to progress ambulation endurance, safety awareness, and dynamic stability, will likely still be safe to return home as long as the pt has access to supplemental O2.      Follow Up Recommendations  Outpatient PT;Supervision  for mobility/OOB     Equipment Recommendations  None recommended by PT    Recommendations for Other Services       Precautions / Restrictions Precautions Precautions: Cervical;Fall Precaution Booklet Issued: Yes (comment) Precaution Comments: on home O2 per note, family reports she is not on O2 Required Braces or Orthoses: Cervical Brace Cervical Brace: Hard collar;At all times Restrictions Weight Bearing Restrictions: No    Mobility  Bed Mobility Overal bed mobility: Needs Assistance Bed Mobility: Rolling;Sit to Sidelying Rolling: Supervision Sidelying to sit: Min guard       General bed mobility comments: VC for log roll    Transfers Overall transfer level: Needs assistance Equipment used: Rolling walker (2 wheeled) Transfers: Sit to/from Stand Sit to Stand: Min guard         General transfer comment: min guard for safety. Cues for technique with rw needed.  Ambulation/Gait Ambulation/Gait assistance: Min guard Gait Distance (Feet): 15 Feet Assistive device: Rolling walker (2 wheeled) Gait Pattern/deviations: Step-through pattern;Decreased stride length Gait velocity: decreased   General Gait Details: slightly impulsive with trip to the bathroom, steady with RW at this time       Balance Overall balance assessment: Needs assistance Sitting-balance support: Bilateral upper extremity supported;Feet supported Sitting balance-Leahy Scale: Fair Sitting balance - Comments: UE support for comfort at sink, pt able to maintain standing x60 sec to wash hands.   Standing balance support: Bilateral upper extremity supported Standing balance-Leahy Scale: Fair Standing balance comment: seeks external support during dynamic tasks  Cognition Arousal/Alertness: Awake/alert Behavior During Therapy: Agitated;Flat affect Overall Cognitive Status: Within Functional Limits for tasks assessed                                  General Comments: pt distracted by pain, making unsafe decisions such as removing O2 for ambulaiton in the room      Exercises      General Comments General comments (skin integrity, edema, etc.): family present during session, stating pt not recieving O2 at home due to fact she is an active smoker, this is a direct contradiction to prior charting notes. Pt currently needing 4LO2 to recover from activity on RA.      Pertinent Vitals/Pain Pain Assessment: Faces Faces Pain Scale: Hurts even more Pain Location: neck and shoulders Pain Descriptors / Indicators: Burning;Grimacing;Moaning Pain Intervention(s): Limited activity within patient's tolerance;Monitored during session;Repositioned     PT Goals (current goals can now be found in the care plan section) Acute Rehab PT Goals Patient Stated Goal: to not be in so much pain PT Goal Formulation: With patient Time For Goal Achievement: 03/25/21 Potential to Achieve Goals: Good Progress towards PT goals: Progressing toward goals    Frequency    Min 5X/week      PT Plan Current plan remains appropriate       AM-PAC PT "6 Clicks" Mobility   Outcome Measure  Help needed turning from your back to your side while in a flat bed without using bedrails?: A Little Help needed moving from lying on your back to sitting on the side of a flat bed without using bedrails?: A Little Help needed moving to and from a bed to a chair (including a wheelchair)?: A Little Help needed standing up from a chair using your arms (e.g., wheelchair or bedside chair)?: A Little Help needed to walk in hospital room?: A Little Help needed climbing 3-5 steps with a railing? : A Little 6 Click Score: 18    End of Session Equipment Utilized During Treatment: Gait belt;Cervical collar;Oxygen Activity Tolerance: Patient limited by fatigue;Patient limited by pain Patient left: in bed;with call bell/phone within reach;with nursing/sitter in room;with  family/visitor present Nurse Communication: Mobility status PT Visit Diagnosis: Difficulty in walking, not elsewhere classified (R26.2);Pain     Time: 1041-1106 PT Time Calculation (min) (ACUTE ONLY): 25 min  Charges:  $Gait Training: 8-22 mins $Therapeutic Activity: 8-22 mins                     Inocencio Homes, PT, DPT   Acute Rehabilitation Department Pager #: [phone]    Sandra Cockayne 03/13/2021, 11:19 AM

## 2021-03-13 NOTE — Progress Notes (Signed)
Pt c/o itching around Aspen collar and request topical oint/cream.  Called Dr. Windy Carina office and left message to request order.

## 2021-03-13 NOTE — Progress Notes (Signed)
Orthopedic Tech Progress Note Patient Details:  [name] 07/08/1961 [id number] Called order into [employer] for replacement pads  Patient ID: Margaret Vaughn, female   DOB: 04-Mar-1961, 60 y.o.   MRN: [id number]  Chip Boer 03/13/2021, 11:30 AM

## 2021-03-14 MED ORDER — OXYCODONE HCL ER 10 MG PO T12A: 10.0000 mg | EXTENDED_RELEASE_TABLET | Freq: Two times a day (BID) | ORAL | 0 refills | Status: DC

## 2021-03-14 MED ORDER — OXYCODONE HCL 10 MG PO TABS: 10.0000 mg | ORAL_TABLET | ORAL | 0 refills | Status: DC | PRN

## 2021-03-14 NOTE — TOC Transition Note (Signed)
Transition of Care (TOC) - CM/SW Discharge Note Marvetta Gibbons RN,BSN Transitions of Care Unit 4NP (non trauma) - RN Case Manager See Treatment Team for direct Phone #    Patient Details  Name: Margaret Vaughn MRN: [id number] Date of Birth: September 21, 1960  Transition of Care [facility]) CM/SW Contact:  Dawayne Patricia, RN Phone Number: 03/14/2021, 1:57 PM   Clinical Narrative:    Pt stable for transition home today,  Spoke with pt at bedside per pt she has used outpt rehab in [place] in past- she will plan to f/u on post op visit for outpt therapy needs. Recs for both PT/OT.  Pt reports she has needed DME- spouse to transport home.    Final next level of care: Home/Self Care Barriers to Discharge: No Barriers Identified   Patient Goals and CMS Choice Patient states their goals for this hospitalization and ongoing recovery are:: return home   Choice offered to / list presented to : NA  Discharge Placement               home        Discharge Plan and Services   Discharge Planning Services: CM Consult Post Acute Care Choice: NA          DME Arranged: N/A DME Agency: NA       HH Arranged: NA HH Agency: NA        Social Determinants of Health (SDOH) Interventions     Readmission Risk Interventions Readmission Risk Prevention Plan 03/14/2021 08/13/2020  Post Dischage Appt Complete Complete  Medication Screening Complete Complete  Transportation Screening Complete Complete  Some recent data might be hidden

## 2021-03-14 NOTE — Progress Notes (Signed)
Physical Therapy Treatment Patient Details Name: Margaret Vaughn MRN: [id number] DOB: 22-Jan-1961 Today's Date: 03/14/2021    History of Present Illness 60 year old female s/p redo decompressive cervical laminectomy at C2 for placement of bilateral C2 pars interarticularis screws. posterior cervical fusion C2-C5, posterior lateral arthrodesis C2-C5. PHMx: cervical myelopathy quadriparesis and severe stenosis initially underwent posterior cervical decompressive laminectomy C1-C2 patient did well recovered a significant amount of strength but still had significant weakness and was nonambulatory patient required anterior stabilization at C2-3 subsequently.  Since then patient has shifted displaced her graft and her plate however it seems to be holding its position there but we need to augment the fusion posteriorly from C1-C4.DM,depression, anxiety, chronic pain,HTN, memory loss, home O2.    PT Comments    The pt was agreeable to session with goal of final education regarding mobility to return home. She was able to complete bed mobility with HOB elevated to 30 deg to mimic home set up and complete bed mobility without physical assist. She also completed multiple sit-stand transfers with supervision, intermittent VC for hand placement, but no physical assist needed. She declined further mobility at this time stating increased nausea after short bout of ambulation in the room, but was educated about how to safely progress ambulation at home with emphasis on monitoring of exertion and SpO2. The pt and her family reported understanding of all education, have no further questions in regards to mobility at this time. Continue to recommend OPPT to maximize pt recovery.    Follow Up Recommendations  Outpatient PT;Supervision for mobility/OOB     Equipment Recommendations  None recommended by PT    Recommendations for Other Services       Precautions / Restrictions Precautions Precautions:  Cervical;Fall Precaution Booklet Issued: Yes (comment) Precaution Comments: watch O2 tank Required Braces or Orthoses: Cervical Brace Cervical Brace: Hard collar;At all times Restrictions Weight Bearing Restrictions: No    Mobility  Bed Mobility Overal bed mobility: Needs Assistance Bed Mobility: Rolling;Sit to Sidelying;Sidelying to Sit Rolling: Supervision Sidelying to sit: Supervision     Sit to sidelying: Supervision General bed mobility comments: VC for log roll, pt stating they have wedge to raise HOB to 30deg so practiced from 30 deg HOB elevation    Transfers Overall transfer level: Needs assistance Equipment used: Rolling walker (2 wheeled) Transfers: Sit to/from Stand Sit to Stand: Min guard         General transfer comment: min guard for safety. Cues for technique with rw needed.  Ambulation/Gait Ambulation/Gait assistance: Min guard Gait Distance (Feet): 15 Feet (+ 10 ft) Assistive device: Rolling walker (2 wheeled) Gait Pattern/deviations: Step-through pattern;Decreased stride length Gait velocity: decreased Gait velocity interpretation: <1.31 ft/sec, indicative of household ambulator General Gait Details: pt demos good stability, reports she is limited by onset of nausea with activity. SpO2 87-90% on 3L with activity     Balance Overall balance assessment: Needs assistance Sitting-balance support: Bilateral upper extremity supported;Feet supported Sitting balance-Leahy Scale: Fair Sitting balance - Comments: UE support for comfort at sink, pt able to maintain standing x60 sec to wash hands.   Standing balance support: Bilateral upper extremity supported Standing balance-Leahy Scale: Fair Standing balance comment: pt benefits from BUE support for gait to reduce pain                            Cognition Arousal/Alertness: Awake/alert Behavior During Therapy: Flat affect Overall Cognitive Status: Within Functional Limits for tasks  assessed                                 General Comments: pt internally distracted by pain      Exercises      General Comments General comments (skin integrity, edema, etc.): HR to 120s, SpO2 87-90% on 3L      Pertinent Vitals/Pain Pain Assessment: Faces Pain Score: 9  Pain Location: neck and shoulders Pain Descriptors / Indicators: Burning;Grimacing;Moaning Pain Intervention(s): Limited activity within patient's tolerance;Monitored during session;Repositioned;Patient requesting pain meds-RN notified     PT Goals (current goals can now be found in the care plan section) Acute Rehab PT Goals Patient Stated Goal: to not be in so much pain PT Goal Formulation: With patient Time For Goal Achievement: 03/25/21 Potential to Achieve Goals: Good Progress towards PT goals: Progressing toward goals    Frequency    Min 5X/week      PT Plan Current plan remains appropriate       AM-PAC PT "6 Clicks" Mobility   Outcome Measure  Help needed turning from your back to your side while in a flat bed without using bedrails?: A Little Help needed moving from lying on your back to sitting on the side of a flat bed without using bedrails?: A Little Help needed moving to and from a bed to a chair (including a wheelchair)?: A Little Help needed standing up from a chair using your arms (e.g., wheelchair or bedside chair)?: A Little Help needed to walk in hospital room?: A Little Help needed climbing 3-5 steps with a railing? : A Little 6 Click Score: 18    End of Session Equipment Utilized During Treatment: Gait belt;Cervical collar;Oxygen Activity Tolerance: Patient limited by fatigue;Patient limited by pain Patient left: in bed;with call bell/phone within reach;with nursing/sitter in room;with family/visitor present Nurse Communication: Mobility status PT Visit Diagnosis: Difficulty in walking, not elsewhere classified (R26.2);Pain     Time: [id number] PT Time  Calculation (min) (ACUTE ONLY): 21 min  Charges:  $Therapeutic Activity: 8-22 mins                     Inocencio Homes, PT, DPT   Acute Rehabilitation Department Pager #: [phone]   Sandra Cockayne 03/14/2021, 2:52 PM

## 2021-03-14 NOTE — Discharge Summary (Signed)
Physician Discharge Summary  Patient ID: Margaret Vaughn MRN: [id number] DOB/AGE: 1960-12-08 60 y.o. Estimated body mass index is 24.03 kg/m as calculated from the following:   Height as of this encounter: '5\' 4"'$  (1.626 m).   Weight as of this encounter: 63.5 kg.   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  Postoperative patient did fairly well good pain control mobilize with physical Occupational Therapy was cleared for discharge with outpatient therapy to be arranged on follow-up.  Patient be discharged on oxycodone for pain schedule follow-up in 1-1/2 to 2 weeks.  Consults: Significant Diagnostic Studies: Treatments: Posterior cervical fusion C2-5 Discharge Exam: Blood pressure 96/63, pulse 99, temperature 98.2 F (36.8 C), temperature source Oral, resp. rate 14, height '5\' 4"'$  (1.626 m), weight 63.5 kg, SpO2 93 %. Strength 5/5 wound clean dry and intact  Disposition: Home   Allergies as of 03/14/2021       Reactions   Iohexol Anaphylaxis   PT.GIVEN OMNI 300 FOR CT ANGIO WAS GIVEN 50 MG.BENADRYL PRIOR TO SCAN HAD NO PROBLEMS   Moxifloxacin Anaphylaxis   Shellfish Allergy Hives        Medication List     TAKE these medications    atorvastatin 40 MG tablet Commonly known as: LIPITOR Take 1 tablet (40 mg total) by mouth daily.   baclofen 10 MG tablet Commonly known as: LIORESAL Take 1 tablet (10 mg total) by mouth 3 (three) times daily as needed for muscle spasms. What changed: when to take this   cholecalciferol 25 MCG (1000 UNIT) tablet Commonly known as: VITAMIN D3 Take 1,000 Units by mouth daily.   cyclobenzaprine 10 MG tablet Commonly known as:  FLEXERIL Take 10 mg by mouth 3 (three) times daily.   DULoxetine 30 MG capsule Commonly known as: CYMBALTA TAKE 1 CAPSULE BY MOUTH EVERY DAY What changed: how much to take   enalapril 5 MG tablet Commonly known as: VASOTEC Take 1 tablet (5 mg total) by mouth daily.   esomeprazole 40 MG capsule Commonly known as: NEXIUM TAKE 1 CAPSULE BY MOUTH EVERY DAY   Fish Oil 1000 MG Caps Take 4,000 mg by mouth daily.   guaiFENesin 600 MG 12 hr tablet Commonly known as: MUCINEX Take 1,200 mg by mouth 2 (two) times daily.   HAIR SKIN NAILS PO Take 3 tablets by mouth daily.   multivitamin with minerals tablet Take 1 tablet by mouth daily.   Janumet 50-1000 MG tablet Generic drug: sitaGLIPtin-metformin Take 1 tablet by mouth 2 (two) times daily with a meal.   methocarbamol 500 MG tablet Commonly known as: Robaxin Take 1 tablet (500 mg total) by mouth 4 (four) times daily.   nicotine 21 mg/24hr patch Commonly known as: NICODERM CQ - dosed in mg/24 hours Place 21 mg onto the skin daily as needed (smoking cessation).   nitrofurantoin 100 MG capsule Commonly known as: MACRODANTIN Take 100 mg by mouth daily.   Oxycodone HCl 10 MG Tabs Take 1 tablet (10 mg total) by mouth every 4 (four) hours as needed for severe pain.   oxyCODONE 10 mg 12 hr tablet Commonly known as: OXYCONTIN Take 1 tablet (10 mg total) by mouth 2 (two) times daily.   oxyCODONE-acetaminophen 10-325 MG tablet  Commonly known as: PERCOCET Take 1 tablet by mouth 4 (four) times daily as needed for pain.   oxyCODONE-acetaminophen 5-325 MG tablet Commonly known as: Percocet Take 1 tablet by mouth every 4 (four) hours as needed for severe pain.   pregabalin 150 MG capsule Commonly known as: LYRICA Take 1 capsule (150 mg total) by mouth 3 (three) times daily.   ProAir HFA 108 (90 Base) MCG/ACT inhaler Generic drug: albuterol TAKE 2 PUFFS BY MOUTH EVERY 6 HOURS AS NEEDED FOR WHEEZE OR SHORTNESS OF BREATH What  changed: See the new instructions.   rizatriptan 10 MG disintegrating tablet Commonly known as: MAXALT-MLT Take 1 tablet (10 mg total) by mouth as needed for migraine. May repeat in 2 hours if needed   Spiriva HandiHaler 18 MCG inhalation capsule Generic drug: tiotropium INHALE CONTENTS OF 1 CAPSULE VIA HANDIHALER ONCE DAILY AT SAME TIME EVERY DAY What changed:  how much to take how to take this when to take this additional instructions   Wixela Inhub 100-50 MCG/ACT Aepb Generic drug: fluticasone-salmeterol INHALE 1 PUFF INTO THE LUNGS TWICE A DAY What changed: See the new instructions.   Xtampza ER 13.5 MG C12a Generic drug: oxyCODONE ER Take 13.5 mg by mouth 2 (two) times daily.         Signed: Elaina Hoops 03/14/2021, 1:00 PM

## 2021-03-14 NOTE — Progress Notes (Signed)
Pt discharged home by Dr. Saintclair Halsted. Pt was educated on all discharge information, [place] collar was ordered and given to pt prior to d/c, PIV removed, and all belongings packed. Pt was taken in a wheelchair to private vehicle with her husband and son.   Justice Rocher, RN

## 2021-03-14 NOTE — Progress Notes (Signed)
Orthopedic Tech Progress Note Patient Details:  Margaret Vaughn [birth year] [id number]  Ortho Devices Type of Ortho Device: [place] cervical collar Ortho Device/Splint Interventions: Ordered      Danton Sewer A Laakea Pereira 03/14/2021, 3:42 PM

## 2021-03-17 ENCOUNTER — Telehealth: Payer: Self-pay

## 2021-03-17 NOTE — Telephone Encounter (Signed)
Transition Care Management Unsuccessful Follow-up Telephone Call  Date of discharge and from where:  03/14/2021-[place]   Attempts:  1st Attempt  Reason for unsuccessful TCM follow-up call:  Voice mail full

## 2021-03-18 NOTE — Telephone Encounter (Signed)
Transition Care Management Unsuccessful Follow-up Telephone Call  Date of discharge and from where:  03/14/2021 from [facility]  Attempts:  2nd Attempt  Reason for unsuccessful TCM follow-up call:  Voice mail full

## 2021-03-19 NOTE — Telephone Encounter (Signed)
Transition Care Management Unsuccessful Follow-up Telephone Call  Date of discharge and from where:  03/14/2021-[place]   Attempts:  3rd Attempt  Reason for unsuccessful TCM follow-up call:  Voice mail full

## 2021-03-27 DIAGNOSIS — R0902 Hypoxemia: Secondary | ICD-10-CM | POA: Diagnosis not present

## 2021-04-04 DIAGNOSIS — J449 Chronic obstructive pulmonary disease, unspecified: Secondary | ICD-10-CM | POA: Diagnosis not present

## 2021-04-04 DIAGNOSIS — E119 Type 2 diabetes mellitus without complications: Secondary | ICD-10-CM | POA: Diagnosis not present

## 2021-04-04 DIAGNOSIS — M4802 Spinal stenosis, cervical region: Secondary | ICD-10-CM | POA: Diagnosis not present

## 2021-04-04 DIAGNOSIS — R030 Elevated blood-pressure reading, without diagnosis of hypertension: Secondary | ICD-10-CM | POA: Diagnosis not present

## 2021-04-04 DIAGNOSIS — G894 Chronic pain syndrome: Secondary | ICD-10-CM | POA: Diagnosis not present

## 2021-04-17 DIAGNOSIS — G959 Disease of spinal cord, unspecified: Secondary | ICD-10-CM | POA: Diagnosis not present

## 2021-04-26 DIAGNOSIS — R0902 Hypoxemia: Secondary | ICD-10-CM | POA: Diagnosis not present

## 2021-05-02 ENCOUNTER — Other Ambulatory Visit: Payer: Self-pay | Admitting: Family Medicine

## 2021-05-02 DIAGNOSIS — J449 Chronic obstructive pulmonary disease, unspecified: Secondary | ICD-10-CM | POA: Diagnosis not present

## 2021-05-02 DIAGNOSIS — M4802 Spinal stenosis, cervical region: Secondary | ICD-10-CM | POA: Diagnosis not present

## 2021-05-02 DIAGNOSIS — E119 Type 2 diabetes mellitus without complications: Secondary | ICD-10-CM | POA: Diagnosis not present

## 2021-05-02 DIAGNOSIS — G894 Chronic pain syndrome: Secondary | ICD-10-CM | POA: Diagnosis not present

## 2021-05-02 DIAGNOSIS — Z79899 Other long term (current) drug therapy: Secondary | ICD-10-CM | POA: Diagnosis not present

## 2021-05-02 DIAGNOSIS — R030 Elevated blood-pressure reading, without diagnosis of hypertension: Secondary | ICD-10-CM | POA: Diagnosis not present

## 2021-05-15 ENCOUNTER — Ambulatory Visit: Payer: Medicaid Other

## 2021-05-19 ENCOUNTER — Ambulatory Visit: Payer: Medicaid Other | Admitting: Physical Therapy

## 2021-05-22 ENCOUNTER — Encounter: Payer: Self-pay | Admitting: Physical Therapy

## 2021-05-22 ENCOUNTER — Other Ambulatory Visit: Payer: Self-pay

## 2021-05-22 ENCOUNTER — Ambulatory Visit: Payer: Medicaid Other | Attending: Neurosurgery | Admitting: Physical Therapy

## 2021-05-22 DIAGNOSIS — M6281 Muscle weakness (generalized): Secondary | ICD-10-CM | POA: Insufficient documentation

## 2021-05-22 DIAGNOSIS — R2681 Unsteadiness on feet: Secondary | ICD-10-CM | POA: Insufficient documentation
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
## 2021-05-26 ENCOUNTER — Other Ambulatory Visit: Payer: Self-pay | Admitting: Family Medicine

## 2021-05-27 DIAGNOSIS — R0902 Hypoxemia: Secondary | ICD-10-CM | POA: Diagnosis not present

## 2021-06-02 DIAGNOSIS — M4312 Spondylolisthesis, cervical region: Secondary | ICD-10-CM | POA: Diagnosis not present

## 2021-06-02 DIAGNOSIS — G959 Disease of spinal cord, unspecified: Secondary | ICD-10-CM | POA: Diagnosis not present

## 2021-06-02 DIAGNOSIS — M2578 Osteophyte, vertebrae: Secondary | ICD-10-CM | POA: Diagnosis not present

## 2021-06-02 DIAGNOSIS — R221 Localized swelling, mass and lump, neck: Secondary | ICD-10-CM | POA: Diagnosis not present

## 2021-06-02 DIAGNOSIS — M4322 Fusion of spine, cervical region: Secondary | ICD-10-CM | POA: Diagnosis not present

## 2021-06-05 ENCOUNTER — Other Ambulatory Visit: Payer: Self-pay

## 2021-06-05 ENCOUNTER — Encounter: Payer: Self-pay | Admitting: Physical Therapy

## 2021-06-05 ENCOUNTER — Ambulatory Visit: Payer: Medicaid Other | Attending: Neurosurgery | Admitting: Physical Therapy

## 2021-06-05 DIAGNOSIS — M6281 Muscle weakness (generalized): Secondary | ICD-10-CM | POA: Diagnosis not present

## 2021-06-05 DIAGNOSIS — R2681 Unsteadiness on feet: Secondary | ICD-10-CM | POA: Diagnosis not present
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
## 2021-06-06 DIAGNOSIS — M4802 Spinal stenosis, cervical region: Secondary | ICD-10-CM | POA: Diagnosis not present

## 2021-06-06 DIAGNOSIS — Z6824 Body mass index (BMI) 24.0-24.9, adult: Secondary | ICD-10-CM | POA: Diagnosis not present

## 2021-06-06 DIAGNOSIS — E119 Type 2 diabetes mellitus without complications: Secondary | ICD-10-CM | POA: Diagnosis not present

## 2021-06-06 DIAGNOSIS — L299 Pruritus, unspecified: Secondary | ICD-10-CM | POA: Diagnosis not present

## 2021-06-06 DIAGNOSIS — Z23 Encounter for immunization: Secondary | ICD-10-CM | POA: Diagnosis not present

## 2021-06-06 DIAGNOSIS — J449 Chronic obstructive pulmonary disease, unspecified: Secondary | ICD-10-CM | POA: Diagnosis not present

## 2021-06-06 DIAGNOSIS — R030 Elevated blood-pressure reading, without diagnosis of hypertension: Secondary | ICD-10-CM | POA: Diagnosis not present

## 2021-06-06 DIAGNOSIS — G950 Syringomyelia and syringobulbia: Secondary | ICD-10-CM | POA: Diagnosis not present

## 2021-06-06 DIAGNOSIS — Z79899 Other long term (current) drug therapy: Secondary | ICD-10-CM | POA: Diagnosis not present

## 2021-06-10 ENCOUNTER — Ambulatory Visit: Payer: Medicaid Other | Admitting: Physical Therapy

## 2021-06-10 ENCOUNTER — Encounter: Payer: Self-pay | Admitting: Physical Therapy

## 2021-06-10 DIAGNOSIS — R2681 Unsteadiness on feet: Secondary | ICD-10-CM | POA: Diagnosis not present

## 2021-06-10 DIAGNOSIS — M6281 Muscle weakness (generalized): Secondary | ICD-10-CM | POA: Diagnosis not present
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
## 2021-06-11 DIAGNOSIS — Z79899 Other long term (current) drug therapy: Secondary | ICD-10-CM | POA: Diagnosis not present

## 2021-06-17 DIAGNOSIS — M542 Cervicalgia: Secondary | ICD-10-CM | POA: Diagnosis not present

## 2021-06-17 DIAGNOSIS — G959 Disease of spinal cord, unspecified: Secondary | ICD-10-CM | POA: Diagnosis not present

## 2021-06-26 ENCOUNTER — Encounter: Payer: Self-pay | Admitting: Family Medicine

## 2021-06-26 ENCOUNTER — Ambulatory Visit (INDEPENDENT_AMBULATORY_CARE_PROVIDER_SITE_OTHER): Payer: Medicaid Other | Admitting: Family Medicine

## 2021-06-26 ENCOUNTER — Ambulatory Visit: Payer: Medicaid Other | Admitting: Family Medicine

## 2021-06-26 DIAGNOSIS — J441 Chronic obstructive pulmonary disease with (acute) exacerbation: Secondary | ICD-10-CM

## 2021-06-26 DIAGNOSIS — R0902 Hypoxemia: Secondary | ICD-10-CM | POA: Diagnosis not present

## 2021-06-26 MED ORDER — BENZONATATE 200 MG PO CAPS: 200.0000 mg | ORAL_CAPSULE | Freq: Three times a day (TID) | ORAL | 0 refills | Status: DC | PRN

## 2021-06-26 MED ORDER — PREDNISONE 10 MG (21) PO TBPK: ORAL_TABLET | ORAL | 0 refills | Status: DC

## 2021-06-26 MED ORDER — AMOXICILLIN-POT CLAVULANATE 875-125 MG PO TABS: 1.0000 | ORAL_TABLET | Freq: Two times a day (BID) | ORAL | 0 refills | Status: AC

## 2021-06-26 NOTE — Progress Notes (Signed)
Virtual Visit via Telephone Note  I connected with Margaret Vaughn on 06/26/21 at 3:58 PM by telephone and verified that I am speaking with the correct person using two identifiers. Margaret Vaughn is currently located at home and nobody is currently with her during this visit. The provider, Loman Brooklyn, FNP is located in their office at time of visit.  I discussed the limitations, risks, security and privacy concerns of performing an evaluation and management service by telephone and the availability of in person appointments. I also discussed with the patient that there may be a patient responsible charge related to this service. The patient expressed understanding and agreed to proceed.  Subjective: PCP: Dettinger, Fransisca Kaufmann, MD  Chief Complaint  Patient presents with   URI   Patient complains of cough, chest congestion, and sore throat. Cough is productive of yellow sputum, which is thicker and more discolored than her usual. Additional symptoms include headache and increased shortness of breath. Onset of symptoms was 4 days ago, gradually worsening since that time. She is drinking plenty of fluids. Evaluation to date: none. Treatment to date:  Mucinex . She has a history of COPD. She does smoke.    ROS: Per HPI  Current Outpatient Medications:    atorvastatin (LIPITOR) 40 MG tablet, Take 1 tablet (40 mg total) by mouth daily., Disp: 90 tablet, Rfl: 3   baclofen (LIORESAL) 10 MG tablet, TAKE 1 TABLET BY MOUTH THREE TIMES A DAY AS NEEDED FOR MUSCLE SPASMS, Disp: 90 tablet, Rfl: 3   cholecalciferol (VITAMIN D3) 25 MCG (1000 UNIT) tablet, Take 1,000 Units by mouth daily., Disp: , Rfl:    cyclobenzaprine (FLEXERIL) 10 MG tablet, Take 10 mg by mouth 3 (three) times daily., Disp: , Rfl:    DULoxetine (CYMBALTA) 30 MG capsule, TAKE 1 CAPSULE BY MOUTH EVERY DAY (Patient taking differently: Take 30 mg by mouth daily.), Disp: 90 capsule, Rfl: 0   enalapril (VASOTEC) 5 MG tablet, Take 1  tablet (5 mg total) by mouth daily. (Patient not taking: Reported on 02/27/2021), Disp: 90 tablet, Rfl: 3   esomeprazole (NEXIUM) 40 MG capsule, TAKE 1 CAPSULE BY MOUTH EVERY DAY (Patient not taking: No sig reported), Disp: 90 capsule, Rfl: 3   fluticasone-salmeterol (WIXELA INHUB) 100-50 MCG/ACT AEPB, Inhale 1 puff into the lungs 2 (two) times daily. (NEEDS TO BE SEEN BEFORE NEXT REFILL), Disp: 60 each, Rfl: 0   guaiFENesin (MUCINEX) 600 MG 12 hr tablet, Take 1,200 mg by mouth 2 (two) times daily., Disp: , Rfl:    methocarbamol (ROBAXIN) 500 MG tablet, Take 1 tablet (500 mg total) by mouth 4 (four) times daily. (Patient not taking: No sig reported), Disp: 45 tablet, Rfl: 0   Multiple Vitamins-Minerals (HAIR SKIN NAILS PO), Take 3 tablets by mouth daily., Disp: , Rfl:    Multiple Vitamins-Minerals (MULTIVITAMIN WITH MINERALS) tablet, Take 1 tablet by mouth daily., Disp: , Rfl:    nicotine (NICODERM CQ - DOSED IN MG/24 HOURS) 21 mg/24hr patch, Place 21 mg onto the skin daily as needed (smoking cessation)., Disp: , Rfl:    nitrofurantoin (MACRODANTIN) 100 MG capsule, Take 100 mg by mouth daily., Disp: , Rfl:    Omega-3 Fatty Acids (FISH OIL) 1000 MG CAPS, Take 4,000 mg by mouth daily., Disp: , Rfl:    oxyCODONE (OXYCONTIN) 10 mg 12 hr tablet, Take 1 tablet (10 mg total) by mouth 2 (two) times daily., Disp: 14 tablet, Rfl: 0   oxyCODONE-acetaminophen (PERCOCET) 10-325 MG  tablet, Take 1 tablet by mouth 4 (four) times daily as needed for pain., Disp: , Rfl:    pregabalin (LYRICA) 150 MG capsule, Take 1 capsule (150 mg total) by mouth 3 (three) times daily., Disp: 90 capsule, Rfl: 2   PROAIR HFA 108 (90 Base) MCG/ACT inhaler, TAKE 2 PUFFS BY MOUTH EVERY 6 HOURS AS NEEDED FOR WHEEZE OR SHORTNESS OF BREATH (Patient taking differently: Inhale 2 puffs into the lungs every 6 (six) hours as needed for shortness of breath or wheezing.), Disp: 8.5 each, Rfl: 3   rizatriptan (MAXALT-MLT) 10 MG disintegrating tablet,  Take 1 tablet (10 mg total) by mouth as needed for migraine. May repeat in 2 hours if needed, Disp: 9 tablet, Rfl: 11   sitaGLIPtin-metformin (JANUMET) 50-1000 MG tablet, Take 1 tablet by mouth 2 (two) times daily with a meal., Disp: 180 tablet, Rfl: 3   tiotropium (SPIRIVA HANDIHALER) 18 MCG inhalation capsule, INHALE CONTENTS OF 1 CAPSULE VIA HANDIHALER ONCE DAILY AT SAME TIME EVERY DAY (Patient taking differently: Place 18 mcg into inhaler and inhale daily.), Disp: 90 capsule, Rfl: 3   XTAMPZA ER 13.5 MG C12A, Take 13.5 mg by mouth 2 (two) times daily., Disp: , Rfl:   Allergies  Allergen Reactions   Iohexol Anaphylaxis    PT.GIVEN OMNI 300 FOR CT ANGIO WAS GIVEN 50 MG.BENADRYL PRIOR TO SCAN HAD NO PROBLEMS    Moxifloxacin Anaphylaxis   Shellfish Allergy Hives   Past Medical History:  Diagnosis Date   Acid reflux    Allergies    Anemia    Asthma    Bronchitis    Cervical stenosis of spine    Chronic pain syndrome    COPD (chronic obstructive pulmonary disease) (HCC)    DDD (degenerative disc disease), lumbar    Depression with anxiety    Diabetes mellitus without complication ([facility])    Dyspnea    Dysrhythmia    PSVT   Fatigue    H/O blood clots    H/O echocardiogram 1989   Per pt, done @ [facility] mitral valve prolapse   Headache(784.0)    Heart murmur    Hemorrhoids, external    History of hiatal hernia    History of kidney stones    History of PSVT (paroxysmal supraventricular tachycardia)    HTN (hypertension)    Hyperlipidemia    Hypoxemia    Insomnia    Memory loss    Myalgia and myositis    Narcolepsy    On home O2    2-4 L @ HS   OSA (obstructive sleep apnea)    Pneumonia    Sleeping difficulty    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HCC)    Thoracalgia    Tobacco abuse    Tremor    Ulcer disease     Observations/Objective: A&O  No respiratory distress or wheezing audible over the phone Mood, judgement, and thought processes all WNL  Assessment and  Plan: 1. COPD exacerbation (HCC) - amoxicillin-clavulanate (AUGMENTIN) 875-125 MG tablet; Take 1 tablet by mouth 2 (two) times daily for 7 days.  Dispense: 14 tablet; Refill: 0 - predniSONE (STERAPRED UNI-PAK 21 TAB) 10 MG (21) TBPK tablet; As directed x 6 days  Dispense: 21 tablet; Refill: 0 - benzonatate (TESSALON) 200 MG capsule; Take 1 capsule (200 mg total) by mouth 3 (three) times daily as needed for cough.  Dispense: 30 capsule; Refill: 0   Follow Up Instructions:  I discussed the assessment and treatment plan with the patient.  The patient was provided an opportunity to ask questions and all were answered. The patient agreed with the plan and demonstrated an understanding of the instructions.   The patient was advised to call back or seek an in-person evaluation if the symptoms worsen or if the condition fails to improve as anticipated.  The above assessment and management plan was discussed with the patient. The patient verbalized understanding of and has agreed to the management plan. Patient is aware to call the clinic if symptoms persist or worsen. Patient is aware when to return to the clinic for a follow-up visit. Patient educated on when it is appropriate to go to the emergency department.   Time call ended: 4:09 PM  I provided 11 minutes of non-face-to-face time during this encounter.  Hendricks Limes, MSN, APRN, FNP-C [facility] 06/26/21

## 2021-07-04 DIAGNOSIS — Z79899 Other long term (current) drug therapy: Secondary | ICD-10-CM | POA: Diagnosis not present

## 2021-07-08 DIAGNOSIS — Z79899 Other long term (current) drug therapy: Secondary | ICD-10-CM | POA: Diagnosis not present

## 2021-07-11 ENCOUNTER — Other Ambulatory Visit: Payer: Self-pay | Admitting: Family Medicine

## 2021-07-27 DIAGNOSIS — R0902 Hypoxemia: Secondary | ICD-10-CM | POA: Diagnosis not present

## 2021-08-08 ENCOUNTER — Ambulatory Visit: Payer: Medicaid Other | Admitting: Physical Therapy

## 2021-08-08 DIAGNOSIS — Z79899 Other long term (current) drug therapy: Secondary | ICD-10-CM | POA: Diagnosis not present

## 2021-08-12 ENCOUNTER — Other Ambulatory Visit: Payer: Self-pay | Admitting: Family Medicine

## 2021-08-14 ENCOUNTER — Other Ambulatory Visit: Payer: Self-pay

## 2021-08-14 ENCOUNTER — Ambulatory Visit: Payer: Medicaid Other | Attending: Neurosurgery | Admitting: Physical Therapy

## 2021-08-14 ENCOUNTER — Other Ambulatory Visit (HOSPITAL_COMMUNITY): Payer: Self-pay | Admitting: Nurse Practitioner

## 2021-08-14 DIAGNOSIS — R2681 Unsteadiness on feet: Secondary | ICD-10-CM | POA: Diagnosis not present

## 2021-08-14 DIAGNOSIS — M6281 Muscle weakness (generalized): Secondary | ICD-10-CM | POA: Insufficient documentation

## 2021-08-14 DIAGNOSIS — Z1231 Encounter for screening mammogram for malignant neoplasm of breast: Secondary | ICD-10-CM

## 2021-08-14 DIAGNOSIS — Z79899 Other long term (current) drug therapy: Secondary | ICD-10-CM | POA: Diagnosis not present
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
## 2021-08-17 ENCOUNTER — Other Ambulatory Visit: Payer: Self-pay | Admitting: Family Medicine

## 2021-08-17 DIAGNOSIS — G629 Polyneuropathy, unspecified: Secondary | ICD-10-CM

## 2021-08-17 DIAGNOSIS — J441 Chronic obstructive pulmonary disease with (acute) exacerbation: Secondary | ICD-10-CM

## 2021-08-17 DIAGNOSIS — F411 Generalized anxiety disorder: Secondary | ICD-10-CM

## 2021-08-17 DIAGNOSIS — I10 Essential (primary) hypertension: Secondary | ICD-10-CM

## 2021-08-17 DIAGNOSIS — E782 Mixed hyperlipidemia: Secondary | ICD-10-CM

## 2021-08-17 DIAGNOSIS — E1169 Type 2 diabetes mellitus with other specified complication: Secondary | ICD-10-CM

## 2021-08-21 DIAGNOSIS — G959 Disease of spinal cord, unspecified: Secondary | ICD-10-CM | POA: Diagnosis not present

## 2021-08-21 DIAGNOSIS — M4312 Spondylolisthesis, cervical region: Secondary | ICD-10-CM | POA: Diagnosis not present

## 2021-08-22 ENCOUNTER — Ambulatory Visit (HOSPITAL_COMMUNITY)
Admission: RE | Admit: 2021-08-22 | Discharge: 2021-08-22 | Disposition: A | Discharge status: Home / Self Care | Payer: Medicaid Other | Source: Ambulatory Visit | Attending: Nurse Practitioner | Admitting: Nurse Practitioner

## 2021-08-22 ENCOUNTER — Other Ambulatory Visit: Payer: Self-pay

## 2021-08-22 DIAGNOSIS — Z1231 Encounter for screening mammogram for malignant neoplasm of breast: Secondary | ICD-10-CM | POA: Diagnosis not present

## 2021-08-28 ENCOUNTER — Ambulatory Visit: Payer: Medicaid Other | Attending: Neurosurgery | Admitting: Physical Therapy

## 2021-08-28 DIAGNOSIS — R2681 Unsteadiness on feet: Secondary | ICD-10-CM | POA: Insufficient documentation

## 2021-08-28 DIAGNOSIS — M542 Cervicalgia: Secondary | ICD-10-CM | POA: Diagnosis not present

## 2021-08-28 DIAGNOSIS — M6281 Muscle weakness (generalized): Secondary | ICD-10-CM | POA: Diagnosis not present

## 2021-08-28 DIAGNOSIS — R1310 Dysphagia, unspecified: Secondary | ICD-10-CM | POA: Diagnosis not present

## 2021-08-28 DIAGNOSIS — R293 Abnormal posture: Secondary | ICD-10-CM | POA: Diagnosis not present
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
## 2021-08-29 DIAGNOSIS — Z79899 Other long term (current) drug therapy: Secondary | ICD-10-CM | POA: Diagnosis not present

## 2021-09-02 ENCOUNTER — Ambulatory Visit: Payer: Medicaid Other | Admitting: *Deleted

## 2021-09-02 ENCOUNTER — Other Ambulatory Visit: Payer: Self-pay

## 2021-09-02 DIAGNOSIS — R2681 Unsteadiness on feet: Secondary | ICD-10-CM

## 2021-09-02 DIAGNOSIS — M6281 Muscle weakness (generalized): Secondary | ICD-10-CM | POA: Diagnosis not present

## 2021-09-02 DIAGNOSIS — M542 Cervicalgia: Secondary | ICD-10-CM

## 2021-09-02 DIAGNOSIS — R293 Abnormal posture: Secondary | ICD-10-CM

## 2021-09-02 DIAGNOSIS — R1310 Dysphagia, unspecified: Secondary | ICD-10-CM | POA: Diagnosis not present
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
## 2021-09-04 ENCOUNTER — Ambulatory Visit: Payer: Medicaid Other | Admitting: Physical Therapy

## 2021-09-04 ENCOUNTER — Other Ambulatory Visit: Payer: Self-pay

## 2021-09-04 DIAGNOSIS — M6281 Muscle weakness (generalized): Secondary | ICD-10-CM | POA: Diagnosis not present

## 2021-09-04 DIAGNOSIS — R1310 Dysphagia, unspecified: Secondary | ICD-10-CM | POA: Diagnosis not present

## 2021-09-04 DIAGNOSIS — R293 Abnormal posture: Secondary | ICD-10-CM | POA: Diagnosis not present

## 2021-09-04 DIAGNOSIS — R2681 Unsteadiness on feet: Secondary | ICD-10-CM | POA: Diagnosis not present

## 2021-09-04 DIAGNOSIS — M542 Cervicalgia: Secondary | ICD-10-CM | POA: Diagnosis not present
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
## 2021-09-05 DIAGNOSIS — Z79899 Other long term (current) drug therapy: Secondary | ICD-10-CM | POA: Diagnosis not present

## 2021-09-09 ENCOUNTER — Ambulatory Visit: Payer: Medicaid Other | Admitting: *Deleted

## 2021-09-09 ENCOUNTER — Other Ambulatory Visit: Payer: Self-pay

## 2021-09-09 DIAGNOSIS — R1310 Dysphagia, unspecified: Secondary | ICD-10-CM | POA: Diagnosis not present

## 2021-09-09 DIAGNOSIS — R293 Abnormal posture: Secondary | ICD-10-CM | POA: Diagnosis not present

## 2021-09-09 DIAGNOSIS — R2681 Unsteadiness on feet: Secondary | ICD-10-CM | POA: Diagnosis not present

## 2021-09-09 DIAGNOSIS — M542 Cervicalgia: Secondary | ICD-10-CM | POA: Diagnosis not present

## 2021-09-09 DIAGNOSIS — M6281 Muscle weakness (generalized): Secondary | ICD-10-CM | POA: Diagnosis not present
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
## 2021-09-10 ENCOUNTER — Ambulatory Visit (INDEPENDENT_AMBULATORY_CARE_PROVIDER_SITE_OTHER): Payer: Medicaid Other | Admitting: Family Medicine

## 2021-09-10 ENCOUNTER — Encounter: Payer: Self-pay | Admitting: Family Medicine

## 2021-09-10 VITALS — BP 131/97 | HR 118 | Ht 64.0 in | Wt 141.0 lb

## 2021-09-10 DIAGNOSIS — I10 Essential (primary) hypertension: Secondary | ICD-10-CM

## 2021-09-10 DIAGNOSIS — G950 Syringomyelia and syringobulbia: Secondary | ICD-10-CM

## 2021-09-10 DIAGNOSIS — G629 Polyneuropathy, unspecified: Secondary | ICD-10-CM

## 2021-09-10 DIAGNOSIS — E782 Mixed hyperlipidemia: Secondary | ICD-10-CM | POA: Diagnosis not present

## 2021-09-10 DIAGNOSIS — E1169 Type 2 diabetes mellitus with other specified complication: Secondary | ICD-10-CM | POA: Diagnosis not present

## 2021-09-10 DIAGNOSIS — E1149 Type 2 diabetes mellitus with other diabetic neurological complication: Secondary | ICD-10-CM

## 2021-09-10 DIAGNOSIS — F411 Generalized anxiety disorder: Secondary | ICD-10-CM | POA: Diagnosis not present

## 2021-09-10 DIAGNOSIS — K219 Gastro-esophageal reflux disease without esophagitis: Secondary | ICD-10-CM

## 2021-09-10 LAB — CMP14+EGFR
ALT: 21 IU/L (ref 0–32)
AST: 13 IU/L (ref 0–40)
Albumin/Globulin Ratio: 1.9 (ref 1.2–2.2)
Albumin: 4.4 g/dL (ref 3.8–4.8)
Alkaline Phosphatase: 101 IU/L (ref 44–121)
BUN/Creatinine Ratio: 11 — ABNORMAL LOW (ref 12–28)
BUN: 8 mg/dL (ref 8–27)
Bilirubin Total: 0.4 mg/dL (ref 0.0–1.2)
CO2: 26 mmol/L (ref 20–29)
Calcium: 9.9 mg/dL (ref 8.7–10.3)
Chloride: 98 mmol/L (ref 96–106)
Creatinine, Ser: 0.72 mg/dL (ref 0.57–1.00)
Globulin, Total: 2.3 g/dL (ref 1.5–4.5)
Glucose: 116 mg/dL — ABNORMAL HIGH (ref 70–99)
Potassium: 4 mmol/L (ref 3.5–5.2)
Sodium: 140 mmol/L (ref 134–144)
Total Protein: 6.7 g/dL (ref 6.0–8.5)
eGFR: 95 mL/min/{1.73_m2} (ref >59)

## 2021-09-10 LAB — CBC WITH DIFFERENTIAL/PLATELET
Basophils Absolute: 0.1 10*3/uL (ref 0.0–0.2)
Basos: 0 %
EOS (ABSOLUTE): 0.2 10*3/uL (ref 0.0–0.4)
Eos: 2 %
Hematocrit: 52 % — ABNORMAL HIGH (ref 34.0–46.6)
Hemoglobin: 17.6 g/dL — ABNORMAL HIGH (ref 11.1–15.9)
Immature Grans (Abs): 0 10*3/uL (ref 0.0–0.1)
Immature Granulocytes: 0 %
Lymphocytes Absolute: 2.9 10*3/uL (ref 0.7–3.1)
Lymphs: 23 %
MCH: 30.2 pg (ref 26.6–33.0)
MCHC: 33.8 g/dL (ref 31.5–35.7)
MCV: 89 fL (ref 79–97)
Monocytes Absolute: 1.2 10*3/uL — ABNORMAL HIGH (ref 0.1–0.9)
Monocytes: 9 %
Neutrophils Absolute: 8.5 10*3/uL — ABNORMAL HIGH (ref 1.4–7.0)
Neutrophils: 66 %
Platelets: 372 10*3/uL (ref 150–450)
RBC: 5.82 x10E6/uL — ABNORMAL HIGH (ref 3.77–5.28)
RDW: 14.6 % (ref 11.7–15.4)
WBC: 12.9 10*3/uL — ABNORMAL HIGH (ref 3.4–10.8)

## 2021-09-10 LAB — LIPID PANEL
Chol/HDL Ratio: 4.1 ratio (ref 0.0–4.4)
Cholesterol, Total: 162 mg/dL (ref 100–199)
HDL: 40 mg/dL (ref >39)
LDL Chol Calc (NIH): 76 mg/dL (ref 0–99)
Triglycerides: 285 mg/dL — ABNORMAL HIGH (ref 0–149)
VLDL Cholesterol Cal: 46 mg/dL — ABNORMAL HIGH (ref 5–40)

## 2021-09-10 LAB — BAYER DCA HB A1C WAIVED: HB A1C (BAYER DCA - WAIVED): 5.5 % (ref 4.8–5.6)

## 2021-09-10 MED ORDER — ATORVASTATIN CALCIUM 40 MG PO TABS: 40.0000 mg | ORAL_TABLET | Freq: Every day | ORAL | 3 refills | Status: DC

## 2021-09-10 MED ORDER — ESOMEPRAZOLE MAGNESIUM 40 MG PO CPDR: 40.0000 mg | DELAYED_RELEASE_CAPSULE | Freq: Every day | ORAL | 3 refills | Status: DC

## 2021-09-10 MED ORDER — JANUMET 50-1000 MG PO TABS: ORAL_TABLET | ORAL | 3 refills | Status: DC

## 2021-09-10 MED ORDER — ENALAPRIL MALEATE 5 MG PO TABS: 5.0000 mg | ORAL_TABLET | Freq: Every day | ORAL | 3 refills | Status: DC

## 2021-09-10 MED ORDER — DULOXETINE HCL 30 MG PO CPEP: ORAL_CAPSULE | ORAL | 3 refills | Status: DC

## 2021-09-10 NOTE — Progress Notes (Signed)
BP (!) 131/97    Pulse (!) 118    Ht _0  (1.626 m)    Wt 141 lb (64 kg)    SpO2 90%    BMI 24.20 kg/m    Subjective:   Patient ID: Margaret Vaughn, female    DOB: 1961/04/25, 61 y.o.   MRN: [id number]  HPI: Margaret Vaughn is a 61 y.o. 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 Donovan Kail for this # meds rx-90 Effectiveness of current meds-works well, she is improved greatly since she has had a surgery. Adverse reactions form meds-none  Pill count performed-No Last drug screen -  ( high risk q35m moderate risk q655mlow risk yearly ) Urine drug screen today- No Was the NCCrawfordeviewed-yes  If yes were their any concerning findings? -Has other sedating agents and concerned more about those but  No flowsheet data found.  Hypertension Patient is currently on enalapril, and their blood pressure today is 131/97. Patient denies any lightheadedness or dizziness. Patient denies headaches, blurred vision, chest pains, shortness of breath, or weakness. Denies any side effects from medication and is content with current medication.   Hyperlipidemia Patient is coming in for recheck of his hyperlipidemia. The patient is currently taking atorvastatin. They deny any issues with myalgias or history of liver damage from it. They deny any focal numbness or weakness or chest pain.   Type 2 diabetes mellitus Patient comes in today for recheck of his diabetes. Patient has been currently taking Janumet. Patient is currently on an ACE inhibitor/ARB. Patient has not seen an ophthalmologist this year. Patient denies any new issues with their feet. The symptom started onset as an adult neuropathy and hypertension and hyperlipidemia ARE RELATED TO DM   Relevant past medical, surgical, family and social history reviewed and updated as indicated. Interim medical  history since our last visit reviewed. Allergies and medications reviewed and updated.  Review of Systems  Constitutional:  Negative for chills and fever.  Eyes:  Negative for visual disturbance.  Respiratory:  Negative for chest tightness and shortness of breath.   Cardiovascular:  Negative for chest pain and leg swelling.  Musculoskeletal:  Positive for arthralgias, back pain and myalgias.  Skin:  Negative for rash.  Neurological:  Positive for weakness and numbness. Negative for light-headedness and headaches.  Psychiatric/Behavioral:  Negative for agitation and behavioral problems.   All other systems reviewed and are negative.  Per HPI unless specifically indicated above   Allergies as of 09/10/2021       Reactions   Iohexol Anaphylaxis   PT.GIVEN OMNI 300 FOR CT ANGIO WAS GIVEN 50 MG.BENADRYL PRIOR TO SCAN HAD NO PROBLEMS   Moxifloxacin Anaphylaxis   Shellfish Allergy Hives        Medication List        Accurate as of September 10, 2021 11:58 AM. If you have any questions, ask your nurse or doctor.          STOP taking these medications    benzonatate 200 MG capsule Commonly known as: TESSALON Stopped by: JoFransisca Kaufmannettinger, MD   predniSONE 10 MG (21) Tbpk tablet Commonly known as: STERAPRED UNI-PAK 21 TAB Stopped by: JoFransisca Kaufmannettinger, MD       TAKE these medications    atorvastatin 40 MG tablet Commonly known as: LIPITOR Take 1 tablet (40  mg total) by mouth daily.   baclofen 10 MG tablet Commonly known as: LIORESAL TAKE 1 TABLET BY MOUTH THREE TIMES A DAY AS NEEDED FOR MUSCLE SPASMS   cholecalciferol 25 MCG (1000 UNIT) tablet Commonly known as: VITAMIN D3 Take 1,000 Units by mouth daily.   cyclobenzaprine 10 MG tablet Commonly known as: FLEXERIL Take 10 mg by mouth 3 (three) times daily.   DULoxetine 30 MG capsule Commonly known as: CYMBALTA TAKE 1 CAPSULE BY MOUTH EVERY DAY What changed:  how much to take how to take this when to take  this additional instructions Changed by: Fransisca Kaufmann Haiden Clucas, MD   enalapril 5 MG tablet Commonly known as: VASOTEC Take 1 tablet (5 mg total) by mouth daily.   esomeprazole 40 MG capsule Commonly known as: NEXIUM Take 1 capsule (40 mg total) by mouth daily. What changed: how much to take Changed by: Fransisca Kaufmann Matika Bartell, MD   Fish Oil 1000 MG Caps Take 4,000 mg by mouth daily.   fluticasone-salmeterol 100-50 MCG/ACT Aepb Commonly known as: Wixela Inhub Inhale 1 puff into the lungs 2 (two) times daily.   guaiFENesin 600 MG 12 hr tablet Commonly known as: MUCINEX Take 1,200 mg by mouth 2 (two) times daily.   HAIR SKIN NAILS PO Take 3 tablets by mouth daily.   multivitamin with minerals tablet Take 1 tablet by mouth daily.   Janumet 50-1000 MG tablet Generic drug: sitaGLIPtin-metformin TAKE 1 TABLET BY MOUTH TWICE A DAY WITH A MEAL   methocarbamol 500 MG tablet Commonly known as: Robaxin Take 1 tablet (500 mg total) by mouth 4 (four) times daily.   nicotine 21 mg/24hr patch Commonly known as: NICODERM CQ - dosed in mg/24 hours Place 21 mg onto the skin daily as needed (smoking cessation).   nitrofurantoin 100 MG capsule Commonly known as: MACRODANTIN Take 100 mg by mouth daily.   oxyCODONE 10 mg 12 hr tablet Commonly known as: OXYCONTIN Take 1 tablet (10 mg total) by mouth 2 (two) times daily.   oxyCODONE-acetaminophen 10-325 MG tablet Commonly known as: PERCOCET Take 1 tablet by mouth 4 (four) times daily as needed for pain.   pregabalin 150 MG capsule Commonly known as: LYRICA Take 1 capsule (150 mg total) by mouth 3 (three) times daily.   rizatriptan 10 MG disintegrating tablet Commonly known as: MAXALT-MLT Take 1 tablet (10 mg total) by mouth as needed for migraine. May repeat in 2 hours if needed   Spiriva HandiHaler 18 MCG inhalation capsule Generic drug: tiotropium INHALE CONTENTS OF 1 CAPSULE VIA HANDIHALER ONCE DAILY AT SAME TIME EVERY DAY What  changed:  how much to take how to take this when to take this additional instructions   Ventolin HFA 108 (90 Base) MCG/ACT inhaler Generic drug: albuterol INHALE 2 PUFFS BY MOUTH EVERY 6 HOURS AS NEEDED FOR WHEEZE OR SHORTNESS OF BREATH   Xtampza ER 13.5 MG C12a Generic drug: oxyCODONE ER Take 13.5 mg by mouth 2 (two) times daily.         Objective:   BP (!) 131/97    Pulse (!) 118    Ht _0  (1.626 m)    Wt 141 lb (64 kg)    SpO2 90%    BMI 24.20 kg/m   Wt Readings from Last 3 Encounters:  09/10/21 141 lb (64 kg)  03/10/21 140 lb (63.5 kg)  03/06/21 146 lb 9.6 oz (66.5 kg)    Physical Exam Vitals and nursing note reviewed.  Constitutional:  General: She is not in acute distress.    Appearance: She is well-developed. She is not diaphoretic.  Eyes:     Conjunctiva/sclera: Conjunctivae normal.  Cardiovascular:     Rate and Rhythm: Normal rate and regular rhythm.     Heart sounds: Normal heart sounds. No murmur heard. Pulmonary:     Effort: Pulmonary effort is normal. No respiratory distress.     Breath sounds: Normal breath sounds. No wheezing.  Musculoskeletal:        General: No tenderness. Normal range of motion.  Skin:    General: Skin is warm and dry.     Findings: No rash.  Neurological:     Mental Status: She is alert and oriented to person, place, and time.     Coordination: Coordination normal.  Psychiatric:        Behavior: Behavior normal.    Diabetic Foot Exam - Simple   Simple Foot Form Diabetic Foot exam was performed with the following findings: Yes 09/10/2021 11:55 AM  Visual Inspection No deformities, no ulcerations, no other skin breakdown bilaterally: Yes Sensation Testing See comments: Yes Pulse Check Posterior Tibialis and Dorsalis pulse intact bilaterally: Yes Comments Patient has thickened yellow toenails on most of her toes on both feet.  She has almost no fine touch sensation in either foot anywhere      Assessment & Plan:    Problem List Items Addressed This Visit       Cardiovascular and Mediastinum   Essential hypertension, benign   Relevant Medications   atorvastatin (LIPITOR) 40 MG tablet   enalapril (VASOTEC) 5 MG tablet   Other Relevant Orders   CBC with Differential/Platelet   CMP14+EGFR     Digestive   GERD   Relevant Medications   esomeprazole (NEXIUM) 40 MG capsule   Other Relevant Orders   Ambulatory referral to Gastroenterology     Endocrine   Mixed diabetic hyperlipidemia associated with type 2 diabetes mellitus (HCC)   Relevant Medications   atorvastatin (LIPITOR) 40 MG tablet   enalapril (VASOTEC) 5 MG tablet   sitaGLIPtin-metformin (JANUMET) 50-1000 MG tablet     Nervous and Auditory   Neuropathy   Syringomyelia and syringobulbia (HCC)     Other   Hyperlipidemia   Relevant Medications   atorvastatin (LIPITOR) 40 MG tablet   enalapril (VASOTEC) 5 MG tablet   Other Relevant Orders   Lipid panel   GAD (generalized anxiety disorder)   Relevant Medications   DULoxetine (CYMBALTA) 30 MG capsule   Other Visit Diagnoses     Type 2 diabetes mellitus with neurological complications (HCC)    -  Primary   Relevant Medications   atorvastatin (LIPITOR) 40 MG tablet   enalapril (VASOTEC) 5 MG tablet   sitaGLIPtin-metformin (JANUMET) 50-1000 MG tablet   Other Relevant Orders   Bayer DCA Hb A1c Waived   Type 2 diabetes mellitus with other specified complication, without long-term current use of insulin (HCC)       Relevant Medications   atorvastatin (LIPITOR) 40 MG tablet   enalapril (VASOTEC) 5 MG tablet   sitaGLIPtin-metformin (JANUMET) 50-1000 MG tablet     A1c looks good at 5.5, continue with diet.  Continue with current medicines.  No change in medicine, continue to see pain management.  Follow up plan: Return in about 14 weeks (around 12/17/2021), or if symptoms worsen or fail to improve, for Diabetes and fibromyalgia.  Counseling provided for all of the vaccine  components Orders Placed This Encounter  Procedures   Bayer DCA Hb A1c Waived   CBC with Differential/Platelet   CMP14+EGFR   Lipid panel   Ambulatory referral to Gastroenterology    Caryl Pina, MD [facility] 09/10/2021, 11:58 AM

## 2021-09-11 ENCOUNTER — Ambulatory Visit: Payer: Medicaid Other | Admitting: Physical Therapy

## 2021-09-11 DIAGNOSIS — M6281 Muscle weakness (generalized): Secondary | ICD-10-CM

## 2021-09-11 DIAGNOSIS — R2681 Unsteadiness on feet: Secondary | ICD-10-CM | POA: Diagnosis not present

## 2021-09-11 DIAGNOSIS — R1310 Dysphagia, unspecified: Secondary | ICD-10-CM | POA: Diagnosis not present

## 2021-09-11 DIAGNOSIS — M542 Cervicalgia: Secondary | ICD-10-CM | POA: Diagnosis not present

## 2021-09-11 DIAGNOSIS — R293 Abnormal posture: Secondary | ICD-10-CM | POA: Diagnosis not present

## 2021-09-11 NOTE — Therapy (Signed)
[facility] [place], [place], [address] Phone: [phone]   Fax:  [phone]  Physical Therapy Treatment  Patient Details  Name: Margaret Vaughn MRN: [id number] Date of Birth: [birth year] Referring Provider (PT): Kary Kos MD   Encounter Date: 09/11/2021   PT End of Session - 09/11/21 1728     Visit Number 6    Number of Visits 12    Date for PT Re-Evaluation 09/25/21    PT Start Time 6301    PT Stop Time 0517    PT Time Calculation (min) 37 min    Activity Tolerance Patient tolerated treatment well    Behavior During Therapy [facility] for tasks assessed/performed             Past Medical History:  Diagnosis Date   Acid reflux    Allergies    Anemia    Asthma    Bronchitis    Cervical stenosis of spine    Chronic pain syndrome    COPD (chronic obstructive pulmonary disease) ([place])    DDD (degenerative disc disease), lumbar    Depression with anxiety    Diabetes mellitus without complication ([place])    Dyspnea    Dysrhythmia    PSVT   Fatigue    H/O blood clots    H/O echocardiogram 1989   Per pt, done @ [facility] mitral valve prolapse   Headache(784.0)    Heart murmur    Hemorrhoids, external    History of hiatal hernia    History of kidney stones    History of PSVT (paroxysmal supraventricular tachycardia)    HTN (hypertension)    Hyperlipidemia    Hypoxemia    Insomnia    Memory loss    Myalgia and myositis    Narcolepsy    On home O2    2-4 L @ HS   OSA (obstructive sleep apnea)    Pneumonia    Sleeping difficulty    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[place])    Thoracalgia    Tobacco abuse    Tremor    Ulcer disease     Past Surgical History:  Procedure Laterality Date   ABDOMINAL HYSTERECTOMY     ANTERIOR CERVICAL DECOMP/DISCECTOMY FUSION N/A 12/18/2020   Procedure: CERVICAL TWO-THREE ANTERIOR CERVICAL DISCECTOMY/DECOMPRESSION FUSION;  Surgeon: Kary Kos, MD;  Location: [facility];  Service:  Neurosurgery;  Laterality: N/A;   APPENDECTOMY  1992   CHOLECYSTECTOMY  1992   DIAGNOSTIC LAPAROSCOPY  1992   lap chole   HERNIA REPAIR     umbilical... as a child   OOPHORECTOMY     POSTERIOR CERVICAL FUSION/FORAMINOTOMY N/A 08/07/2020   Procedure: POSTERIOR CERVICAL LAMINECTOMY FOR MYELOPATHY;  Surgeon: Kary Kos, MD;  Location: [facility];  Service: Neurosurgery;  Laterality: N/A;   POSTERIOR CERVICAL FUSION/FORAMINOTOMY N/A 03/10/2021   Procedure: Posterior cervical fusion with lateral mass fixation Cervical one - cervical four;  Surgeon: Kary Kos, MD;  Location: [facility];  Service: Neurosurgery;  Laterality: N/A;   POSTERIOR CERVICAL LAMINECTOMY  08/07/2020   w/ Dr. Saintclair Halsted   tonsillectomy     TONSILLECTOMY     TUBAL LIGATION      There were no vitals filed for this visit.   Subjective Assessment - 09/11/21 1727     Subjective Patient feeling tired today and only about 4 hours sleep last night.  She had a busy day yesterday and states he legs feel like noodles.    Patient is accompained by:  Family member    Pertinent History Chronic pain syndrome, COPD, DDD, headaches, HTN, OSA, Thoracalgia, three cervical surgeries.    How long can you walk comfortably? Around home with assistance.    Patient Stated Goals See above.                               [facility] Adult PT Treatment/Exercise - 09/11/21 0001       Exercises   Exercises Knee/Hip;Ankle      Lumbar Exercises: Aerobic   Nustep Level 3 x 23 minutes achieving one mile (patient goal).      Ankle Exercises: Seated   Other Seated Ankle Exercises Seated 2 pound ankle isolator on left into dorsiflexion, inversion and eversion (4 minutes total).                          PT Long Term Goals - 08/14/21 1618       PT LONG TERM GOAL #1   Title Independent with a HEP.    Baseline No knowledge of appropriate ther ex.    Time 6    Period Weeks    Status New      PT LONG TERM GOAL #2   Title  Patient walk in clinic 250 feet with a straight cane with CGA.    Baseline The patient is currently walking with HHA and she is at  risk for falls.    Time 6    Period Weeks    Status New      PT LONG TERM GOAL #4   Title Solid negative Romberg    Baseline Romberg test was wobbly and supervision is required.    Time 6    Period Weeks    Status New                   Plan - 09/11/21 1737     Clinical Impression Statement The patient feeling tired today, therefore, we decreased treatment some.  Nonetheless, the patient remians very motivated and works very hard during her physical therapy visits.    Personal Factors and Comorbidities Comorbidity 1;Comorbidity 2;Comorbidity 3+;Other    Comorbidities Chronic pain syndrome, COPD, DDD, headaches, HTN, OSA, Thoracalgia, three cervical surgeries.    Examination-Activity Limitations Other;Locomotion Level    Examination-Participation Restrictions Other    Stability/Clinical Decision Making Stable/Uncomplicated    Rehab Potential Good    PT Frequency 2x / week    PT Duration 6 weeks    PT Treatment/Interventions ADLs/Self Care Home Management;Neuromuscular re-education;Balance training;Therapeutic exercise;Therapeutic activities;Functional mobility training;Gait training;Patient/family education;Stair training    PT Next Visit Plan Gait training with cane, Nustep,  low-level core and LE exercise (no Valsalva Maneuver), balance and neuro re-education.    Consulted and Agree with Plan of Care Patient             Patient will benefit from skilled therapeutic intervention in order to improve the following deficits and impairments:  Pain, Abnormal gait, Decreased activity tolerance, Decreased balance, Decreased mobility, Decreased strength, Difficulty walking, Postural dysfunction  Visit Diagnosis: Muscle weakness (generalized)  Unsteadiness on feet     Problem List Patient Active Problem List   Diagnosis Date Noted    Syringomyelia and syringobulbia ([place]) 09/10/2021   Myelopathy ([place]) 03/10/2021   HNP (herniated nucleus pulposus) with myelopathy, cervical 12/18/2020   Cervical myelopathy ([place]) 08/13/2020   Cervical spinal cord compression ([place]) 08/08/2020  Chronic respiratory failure with hypoxia ([place]) 08/08/2020   Type 2 diabetes mellitus with diabetic polyneuropathy, without long-term current use of insulin ([place]) 08/08/2020   Fibromyalgia 01/06/2019   Neuropathy 01/06/2019   Overweight 03/11/2018   COPD (chronic obstructive pulmonary disease) ([place]) 03/11/2018   Mixed diabetic hyperlipidemia associated with type 2 diabetes mellitus ([place]) 03/11/2018   PTSD (post-traumatic stress disorder) 03/11/2018   GAD (generalized anxiety disorder) 01/30/2014   Essential hypertension, benign 01/30/2014   Hyperlipidemia 08/10/2012   Tobacco abuse 08/10/2012   GERD 12/24/2009    Piera Downs, [place], PT 09/11/2021, 5:40 PM  [facility] [address] [place], [place], [address] Phone: [phone]   Fax:  [phone]  Name: FREYA ZOBRIST MRN: [id number] Date of Birth: Oct 10, 1960

## 2021-09-16 ENCOUNTER — Ambulatory Visit: Payer: Medicaid Other | Admitting: *Deleted

## 2021-09-17 ENCOUNTER — Encounter: Payer: Self-pay | Admitting: Internal Medicine

## 2021-09-18 ENCOUNTER — Encounter: Payer: Medicaid Other | Admitting: *Deleted

## 2021-09-23 ENCOUNTER — Ambulatory Visit: Payer: Medicaid Other | Admitting: Physical Therapy

## 2021-09-23 DIAGNOSIS — R2681 Unsteadiness on feet: Secondary | ICD-10-CM

## 2021-09-23 DIAGNOSIS — R1310 Dysphagia, unspecified: Secondary | ICD-10-CM | POA: Diagnosis not present

## 2021-09-23 DIAGNOSIS — M542 Cervicalgia: Secondary | ICD-10-CM | POA: Diagnosis not present

## 2021-09-23 DIAGNOSIS — M6281 Muscle weakness (generalized): Secondary | ICD-10-CM | POA: Diagnosis not present

## 2021-09-23 DIAGNOSIS — R293 Abnormal posture: Secondary | ICD-10-CM | POA: Diagnosis not present

## 2021-09-23 NOTE — Therapy (Signed)
[facility] [place], [place], [address] Phone: [phone]   Fax:  [phone]  Physical Therapy Treatment  Patient Details  Name: Margaret Vaughn MRN: [id number] Date of Birth: 02-25-1961 Referring Provider (PT): Kary Kos MD   Encounter Date: 09/23/2021   PT End of Session - 09/23/21 1805     Visit Number 7    Number of Visits 12    Date for PT Re-Evaluation 09/25/21    PT Start Time 0445    PT Stop Time 0530    PT Time Calculation (min) 45 min    Activity Tolerance Patient tolerated treatment well    Behavior During Therapy [facility] for tasks assessed/performed             Past Medical History:  Diagnosis Date   Acid reflux    Allergies    Anemia    Asthma    Bronchitis    Cervical stenosis of spine    Chronic pain syndrome    COPD (chronic obstructive pulmonary disease) ([place])    DDD (degenerative disc disease), lumbar    Depression with anxiety    Diabetes mellitus without complication ([place])    Dyspnea    Dysrhythmia    PSVT   Fatigue    H/O blood clots    H/O echocardiogram 1989   Per pt, done @ [place] mitral valve prolapse   Headache(784.0)    Heart murmur    Hemorrhoids, external    History of hiatal hernia    History of kidney stones    History of PSVT (paroxysmal supraventricular tachycardia)    HTN (hypertension)    Hyperlipidemia    Hypoxemia    Insomnia    Memory loss    Myalgia and myositis    Narcolepsy    On home O2    2-4 L @ HS   OSA (obstructive sleep apnea)    Pneumonia    Sleeping difficulty    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[place])    Thoracalgia    Tobacco abuse    Tremor    Ulcer disease     Past Surgical History:  Procedure Laterality Date   ABDOMINAL HYSTERECTOMY     ANTERIOR CERVICAL DECOMP/DISCECTOMY FUSION N/A 12/18/2020   Procedure: CERVICAL TWO-THREE ANTERIOR CERVICAL DISCECTOMY/DECOMPRESSION FUSION;  Surgeon: Kary Kos, MD;  Location: [facility];  Service:  Neurosurgery;  Laterality: N/A;   APPENDECTOMY  1992   CHOLECYSTECTOMY  1992   DIAGNOSTIC LAPAROSCOPY  1992   lap chole   HERNIA REPAIR     umbilical... as a child   OOPHORECTOMY     POSTERIOR CERVICAL FUSION/FORAMINOTOMY N/A 08/07/2020   Procedure: POSTERIOR CERVICAL LAMINECTOMY FOR MYELOPATHY;  Surgeon: Kary Kos, MD;  Location: [facility];  Service: Neurosurgery;  Laterality: N/A;   POSTERIOR CERVICAL FUSION/FORAMINOTOMY N/A 03/10/2021   Procedure: Posterior cervical fusion with lateral mass fixation Cervical one - cervical four;  Surgeon: Kary Kos, MD;  Location: [facility];  Service: Neurosurgery;  Laterality: N/A;   POSTERIOR CERVICAL LAMINECTOMY  08/07/2020   w/ Dr. Saintclair Halsted   tonsillectomy     TONSILLECTOMY     TUBAL LIGATION      There were no vitals filed for this visit.   Subjective Assessment - 09/23/21 1804     Subjective Patient doing okay today.  She states she has a blister on the bottom of a foot she is monitoring.    Patient is accompained by: Family member    Pertinent  History Chronic pain syndrome, COPD, DDD, headaches, HTN, OSA, Thoracalgia, three cervical surgeries.    How long can you walk comfortably? Around home with assistance.    Patient Stated Goals See above.                               [facility] [facility] Adult PT Treatment/Exercise - 09/23/21 0001       Exercises   Exercises Knee/Hip      Lumbar Exercises: Aerobic   Nustep Monitored Nustep at level 5 then reducing to level 3 due to leg tiredness x 28 minutes for a total of one mile.                 Balance Exercises - 09/23/21 0001       Balance Exercises: Standing   Other Standing Exercises Rockerboard 3 minutes in parallel bars DF/PF, side to side x 3 minutes f/b foam balance beam side stepping 3 minutes and heel to toe x 3 minutes.                     PT Long Term Goals - 08/14/21 1618       PT LONG TERM GOAL #1   Title Independent with a HEP.    Baseline No  knowledge of appropriate ther ex.    Time 6    Period Weeks    Status New      PT LONG TERM GOAL #2   Title Patient walk in clinic 250 feet with a straight cane with CGA.    Baseline The patient is currently walking with HHA and she is at  risk for falls.    Time 6    Period Weeks    Status New      PT LONG TERM GOAL #4   Title Solid negative Romberg    Baseline Romberg test was wobbly and supervision is required.    Time 6    Period Weeks    Status New                   Plan - 09/23/21 1810     Clinical Impression Statement The patient did gerat today and is highly motivated.  Added foam balance beam activity with parallel bar support as needed.  She did very well with this.    Personal Factors and Comorbidities Comorbidity 1;Comorbidity 2;Comorbidity 3+;Other    Comorbidities Chronic pain syndrome, COPD, DDD, headaches, HTN, OSA, Thoracalgia, three cervical surgeries.    Examination-Activity Limitations Other;Locomotion Level    Examination-Participation Restrictions Other    Stability/Clinical Decision Making Stable/Uncomplicated    Rehab Potential Good    PT Frequency 2x / week    PT Duration 6 weeks    PT Treatment/Interventions ADLs/Self Care Home Management;Neuromuscular re-education;Balance training;Therapeutic exercise;Therapeutic activities;Functional mobility training;Gait training;Patient/family education;Stair training    PT Next Visit Plan Gait training with cane, Nustep,  low-level core and LE exercise (no Valsalva Maneuver), balance and neuro re-education.             Patient will benefit from skilled therapeutic intervention in order to improve the following deficits and impairments:  Pain, Abnormal gait, Decreased activity tolerance, Decreased balance, Decreased mobility, Decreased strength, Difficulty walking, Postural dysfunction  Visit Diagnosis: Muscle weakness (generalized)  Unsteadiness on feet     Problem List Patient Active  Problem List   Diagnosis Date Noted   Syringomyelia and syringobulbia ([place]) 09/10/2021  Myelopathy ([place]) 03/10/2021   HNP (herniated nucleus pulposus) with myelopathy, cervical 12/18/2020   Cervical myelopathy ([place]) 08/13/2020   Cervical spinal cord compression ([place]) 08/08/2020   Chronic respiratory failure with hypoxia ([place]) 08/08/2020   Type 2 diabetes mellitus with diabetic polyneuropathy, without long-term current use of insulin ([place]) 08/08/2020   Fibromyalgia 01/06/2019   Neuropathy 01/06/2019   Overweight 03/11/2018   COPD (chronic obstructive pulmonary disease) ([place]) 03/11/2018   Mixed diabetic hyperlipidemia associated with type 2 diabetes mellitus ([place]) 03/11/2018   PTSD (post-traumatic stress disorder) 03/11/2018   GAD (generalized anxiety disorder) 01/30/2014   Essential hypertension, benign 01/30/2014   Hyperlipidemia 08/10/2012   Tobacco abuse 08/10/2012   GERD 12/24/2009    Cari Vandeberg, [place], PT 09/23/2021, 6:12 PM  [facility] [address] [place], [place], [address] Phone: [phone]   Fax:  [phone]  Name: Margaret Vaughn MRN: [id number] Date of Birth: 13-Nov-1960

## 2021-09-24 DIAGNOSIS — R0902 Hypoxemia: Secondary | ICD-10-CM | POA: Diagnosis not present

## 2021-09-25 ENCOUNTER — Ambulatory Visit: Payer: Medicaid Other | Attending: Neurosurgery | Admitting: Physical Therapy

## 2021-09-25 ENCOUNTER — Other Ambulatory Visit: Payer: Self-pay

## 2021-09-25 DIAGNOSIS — M6281 Muscle weakness (generalized): Secondary | ICD-10-CM | POA: Diagnosis not present

## 2021-09-25 DIAGNOSIS — R2681 Unsteadiness on feet: Secondary | ICD-10-CM | POA: Insufficient documentation

## 2021-09-25 NOTE — Therapy (Signed)
[facility] ?[place] ?[place], [place], [address] ?Phone: [phone]   Fax:  [phone] ? ?Physical Therapy Treatment ? ?Patient Details  ?Name: Margaret Vaughn ?MRN: [id number] ?Date of Birth: April 22, 1961 ?Referring Provider (PT): Kary Kos MD ? ? ?Encounter Date: 09/25/2021 ? ? PT End of Session - 09/25/21 1747   ? ? Visit Number 8   ? Number of Visits 12   ? Date for PT Re-Evaluation 09/25/21   ? PT Start Time 0448   ? PT Stop Time 0534   ? PT Time Calculation (min) 46 min   ? Activity Tolerance Patient tolerated treatment well   ? Behavior During Therapy [facility] for tasks assessed/performed   ? ?  ?  ? ?  ? ? ?Past Medical History:  ?Diagnosis Date  ? Acid reflux   ? Allergies   ? Anemia   ? Asthma   ? Bronchitis   ? Cervical stenosis of spine   ? Chronic pain syndrome   ? COPD (chronic obstructive pulmonary disease) ([place])   ? DDD (degenerative disc disease), lumbar   ? Depression with anxiety   ? Diabetes mellitus without complication ([place])   ? Dyspnea   ? Dysrhythmia   ? PSVT  ? Fatigue   ? H/O blood clots   ? H/O echocardiogram 1989  ? Per pt, done @ [place] mitral valve prolapse  ? Headache(784.0)   ? Heart murmur   ? Hemorrhoids, external   ? History of hiatal hernia   ? History of kidney stones   ? History of PSVT (paroxysmal supraventricular tachycardia)   ? HTN (hypertension)   ? Hyperlipidemia   ? Hypoxemia   ? Insomnia   ? Memory loss   ? Myalgia and myositis   ? Narcolepsy   ? On home O2   ? 2-4 L @ HS  ? OSA (obstructive sleep apnea)   ? Pneumonia   ? Sleeping difficulty   ? 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 Thoracalgia   ? Tobacco abuse   ? Tremor   ? Ulcer disease   ? ? ?Past Surgical History:  ?Procedure Laterality Date  ? ABDOMINAL HYSTERECTOMY    ? ANTERIOR CERVICAL DECOMP/DISCECTOMY FUSION N/A 12/18/2020  ? Procedure: CERVICAL TWO-THREE ANTERIOR CERVICAL DISCECTOMY/DECOMPRESSION FUSION;  Surgeon: Kary Kos, MD;  Location: [facility];  Service:  Neurosurgery;  Laterality: N/A;  ? APPENDECTOMY  1992  ? CHOLECYSTECTOMY  1992  ? DIAGNOSTIC LAPAROSCOPY  1992  ? lap chole  ? HERNIA REPAIR    ? umbilical... as a child  ? OOPHORECTOMY    ? POSTERIOR CERVICAL FUSION/FORAMINOTOMY N/A 08/07/2020  ? Procedure: POSTERIOR CERVICAL LAMINECTOMY FOR MYELOPATHY;  Surgeon: Kary Kos, MD;  Location: [facility];  Service: Neurosurgery;  Laterality: N/A;  ? POSTERIOR CERVICAL FUSION/FORAMINOTOMY N/A 03/10/2021  ? Procedure: Posterior cervical fusion with lateral mass fixation Cervical one - cervical four;  Surgeon: Kary Kos, MD;  Location: [facility];  Service: Neurosurgery;  Laterality: N/A;  ? POSTERIOR CERVICAL LAMINECTOMY  08/07/2020  ? w/ Dr. Saintclair Halsted  ? tonsillectomy    ? TONSILLECTOMY    ? TUBAL LIGATION    ? ? ?There were no vitals filed for this visit. ? ? Subjective Assessment - 09/25/21 1748   ? ? Subjective No new complaints.   ? Patient is accompained by: Family member   ? Pertinent History Chronic pain syndrome, COPD, DDD, headaches, HTN, OSA, Thoracalgia, three cervical surgeries.   ? How  long can you walk comfortably? Around home with assistance.   ? Patient Stated Goals See above.   ? Currently in Pain? No/denies   ? ?  ?  ? ?  ? ? ? ? ? ? ? ? ? ? ? ? ? ? ? ? ? ? ? ? [facility] Adult PT Treatment/Exercise - 09/25/21 0001   ? ?  ? Exercises  ? Exercises Knee/Hip   ?  ? Lumbar Exercises: Aerobic  ? Nustep Level 3 x 1 mile in 22 minutes, excellent cadence.   ?  ? Lumbar Exercises: Machines for Strengthening  ? Cybex Knee Extension 10# x 2 minutes.   ? Cybex Knee Flexion 30# x 2 minutes.   ?  ? Ankle Exercises: Seated  ? Other Seated Ankle Exercises Seated 2# ankle isolator on left into dorsiflexion, inv/ever x 4 minutes.   ? ?  ?  ? ?  ? ? ? ? ? ? Balance Exercises - 09/25/21 0001   ? ?  ? Balance Exercises: Standing  ? Other Standing Exercises Inverted BOSU ball x 4 minutes with minimal use of hands on parallel bars f/b ball tosses with patient on Airex balanace pad x 4 minutes.    ? ?  ?  ? ?  ? ? ? ? ? ? ? ? ? ? PT Long Term Goals - 08/14/21 1618   ? ?  ? PT LONG TERM GOAL #1  ? Title Independent with a HEP.   ? Baseline No knowledge of appropriate ther ex.   ? Time 6   ? Period Weeks   ? Status New   ?  ? PT LONG TERM GOAL #2  ? Title Patient walk in clinic 250 feet with a straight cane with CGA.   ? Baseline The patient is currently walking with HHA and she is at  risk for falls.   ? Time 6   ? Period Weeks   ? Status New   ?  ? PT LONG TERM GOAL #4  ? Title Solid negative Romberg   ? Baseline Romberg test was wobbly and supervision is required.   ? Time 6   ? Period Weeks   ? Status New   ? ?  ?  ? ?  ? ? ? ? ? ? ? ? Plan - 09/25/21 1752   ? ? Clinical Impression Statement Patient did a fantastic job today.  Increased cadenece/step per minute on Nustep today. She performed low weight knee extension and ham curls with excellent technique.  Her balance is showing very good improvement as she was able to stand on inverted BOSU ball with minimal and at times no use of parallel bars to maintain balance.   ? Personal Factors and Comorbidities Comorbidity 1;Comorbidity 2;Comorbidity 3+;Other   ? Comorbidities Chronic pain syndrome, COPD, DDD, headaches, HTN, OSA, Thoracalgia, three cervical surgeries.   ? Examination-Activity Limitations Other;Locomotion Level   ? Examination-Participation Restrictions Other   ? Stability/Clinical Decision Making Stable/Uncomplicated   ? Rehab Potential Good   ? PT Frequency 2x / week   ? PT Duration 6 weeks   ? PT Treatment/Interventions ADLs/Self Care Home Management;Neuromuscular re-education;Balance training;Therapeutic exercise;Therapeutic activities;Functional mobility training;Gait training;Patient/family education;Stair training   ? PT Next Visit Plan Continue with POC.   ? Consulted and Agree with Plan of Care Patient   ? ?  ?  ? ?  ? ? ?Patient will benefit from skilled therapeutic intervention in order to improve the following  deficits and  impairments:  Pain, Abnormal gait, Decreased activity tolerance, Decreased balance, Decreased mobility, Decreased strength, Difficulty walking, Postural dysfunction ? ?Visit Diagnosis: ?Muscle weakness (generalized) ? ?Unsteadiness on feet ? ? ? ? ?Problem List ?Patient Active Problem List  ? Diagnosis Date Noted  ? 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 Myelopathy ([place]) 03/10/2021  ? HNP (herniated nucleus pulposus) with myelopathy, cervical 12/18/2020  ? Cervical myelopathy ([place]) 08/13/2020  ? Cervical spinal cord compression ([place]) 08/08/2020  ? Chronic respiratory failure with hypoxia ([place]) 08/08/2020  ? Type 2 diabetes mellitus with diabetic polyneuropathy, without long-term current use of insulin ([place]) 08/08/2020  ? Fibromyalgia 01/06/2019  ? Neuropathy 01/06/2019  ? Overweight 03/11/2018  ? COPD (chronic obstructive pulmonary disease) ([place]) 03/11/2018  ? Mixed diabetic hyperlipidemia associated with type 2 diabetes mellitus ([place]) 03/11/2018  ? PTSD (post-traumatic stress disorder) 03/11/2018  ? GAD (generalized anxiety disorder) 01/30/2014  ? Essential hypertension, benign 01/30/2014  ? Hyperlipidemia 08/10/2012  ? Tobacco abuse 08/10/2012  ? GERD 12/24/2009  ? ? ?Vin Yonke, [place], PT ?09/25/2021, 5:57 PM ? ?[facility] ?[place] ?[place], [place], [address] ?Phone: [phone]   Fax:  [phone] ? ?Name: Margaret Vaughn ?MRN: [id number] ?Date of Birth: 29-Jan-1961 ? ? ? ?

## 2021-10-03 DIAGNOSIS — Z79899 Other long term (current) drug therapy: Secondary | ICD-10-CM | POA: Diagnosis not present

## 2021-10-07 ENCOUNTER — Ambulatory Visit: Payer: Medicaid Other | Admitting: Physical Therapy

## 2021-10-07 ENCOUNTER — Other Ambulatory Visit: Payer: Self-pay

## 2021-10-07 DIAGNOSIS — Z79899 Other long term (current) drug therapy: Secondary | ICD-10-CM | POA: Diagnosis not present

## 2021-10-07 DIAGNOSIS — M6281 Muscle weakness (generalized): Secondary | ICD-10-CM | POA: Diagnosis not present

## 2021-10-07 DIAGNOSIS — R2681 Unsteadiness on feet: Secondary | ICD-10-CM

## 2021-10-07 NOTE — Therapy (Signed)
[facility] ?[facility] ?[place] ?[place], [place], [address] ?Phone: [phone]   Fax:  [phone] ? ?Physical Therapy Treatment ? ?Patient Details  ?Name: Margaret Vaughn ?MRN: [id number] ?Date of Birth: [birth year] ?Referring Provider (PT): Kary Kos MD ? ? ?Encounter Date: 10/07/2021 ? ? PT End of Session - 10/07/21 1753   ? ? Visit Number 9   ? Number of Visits 12   ? Date for PT Re-Evaluation 09/25/21   ? PT Start Time [id number]   ? PT Stop Time 0539   ? PT Time Calculation (min) 53 min   ? Activity Tolerance Patient tolerated treatment well   ? Behavior During Therapy [facility] for tasks assessed/performed   ? ?  ?  ? ?  ? ? ?Past Medical History:  ?Diagnosis Date  ? Acid reflux   ? Allergies   ? Anemia   ? Asthma   ? Bronchitis   ? Cervical stenosis of spine   ? Chronic pain syndrome   ? COPD (chronic obstructive pulmonary disease) ([place])   ? DDD (degenerative disc disease), lumbar   ? Depression with anxiety   ? Diabetes mellitus without complication ([place])   ? Dyspnea   ? Dysrhythmia   ? PSVT  ? Fatigue   ? H/O blood clots   ? H/O echocardiogram 1989  ? Per pt, done @ [place] mitral valve prolapse  ? Headache(784.0)   ? Heart murmur   ? Hemorrhoids, external   ? History of hiatal hernia   ? History of kidney stones   ? History of PSVT (paroxysmal supraventricular tachycardia)   ? HTN (hypertension)   ? Hyperlipidemia   ? Hypoxemia   ? Insomnia   ? Memory loss   ? Myalgia and myositis   ? Narcolepsy   ? On home O2   ? 2-4 L @ HS  ? OSA (obstructive sleep apnea)   ? Pneumonia   ? Sleeping difficulty   ? 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 Thoracalgia   ? Tobacco abuse   ? Tremor   ? Ulcer disease   ? ? ?Past Surgical History:  ?Procedure Laterality Date  ? ABDOMINAL HYSTERECTOMY    ? ANTERIOR CERVICAL DECOMP/DISCECTOMY FUSION N/A 12/18/2020  ? Procedure: CERVICAL TWO-THREE ANTERIOR CERVICAL DISCECTOMY/DECOMPRESSION FUSION;  Surgeon: Kary Kos, MD;  Location: [facility];  Service:  Neurosurgery;  Laterality: N/A;  ? APPENDECTOMY  1992  ? CHOLECYSTECTOMY  1992  ? DIAGNOSTIC LAPAROSCOPY  1992  ? lap chole  ? HERNIA REPAIR    ? umbilical... as a child  ? OOPHORECTOMY    ? POSTERIOR CERVICAL FUSION/FORAMINOTOMY N/A 08/07/2020  ? Procedure: POSTERIOR CERVICAL LAMINECTOMY FOR MYELOPATHY;  Surgeon: Kary Kos, MD;  Location: [facility];  Service: Neurosurgery;  Laterality: N/A;  ? POSTERIOR CERVICAL FUSION/FORAMINOTOMY N/A 03/10/2021  ? Procedure: Posterior cervical fusion with lateral mass fixation Cervical one - cervical four;  Surgeon: Kary Kos, MD;  Location: [facility];  Service: Neurosurgery;  Laterality: N/A;  ? POSTERIOR CERVICAL LAMINECTOMY  08/07/2020  ? w/ Dr. Saintclair Halsted  ? tonsillectomy    ? TONSILLECTOMY    ? TUBAL LIGATION    ? ? ?There were no vitals filed for this visit. ? ? Subjective Assessment - 10/07/21 1746   ? ? Subjective No new complaints.   ? Pertinent History Chronic pain syndrome, COPD, DDD, headaches, HTN, OSA, Thoracalgia, three cervical surgeries.   ? How long can you walk comfortably? Around home with assistance.   ?  Patient Stated Goals See above.   ? Currently in Pain? No/denies   ? ?  ?  ? ?  ? ? ? ? ? ? ? ? ? ? ? ? ? ? ? ? ? ? ? ? [facility] Adult PT Treatment/Exercise - 10/07/21 0001   ? ?  ? Exercises  ? Exercises Knee/Hip   ?  ? Lumbar Exercises: Aerobic  ? Nustep Level 3 x 25 minutes.   ?  ? Knee/Hip Exercises: Machines for Strengthening  ? Cybex Knee Extension 10# x 3 minutes.   ? Cybex Knee Flexion 30# x 3 minutes.   ?  ? Ankle Exercises: Seated  ? Other Seated Ankle Exercises Seated 2# x 4 minutes into DF, INV/EVER.   ? ?  ?  ? ?  ? ? ? ? ? ? Balance Exercises - 10/07/21 0001   ? ?  ? Balance Exercises: Standing  ? Other Standing Exercises Inverted BOSU ball in parallel bars x 5 minutes.   ? ?  ?  ? ?  ? ? ? ? ? PT Education - 10/07/21 1749   ? ? Education Details Red and green theraband provided for seated ham curls.   ? Person(s) Educated Patient;Spouse   ? Methods Explanation    ? Comprehension Returned demonstration;Verbalized understanding   ? ?  ?  ? ?  ? ? ? ? ? ? PT Long Term Goals - 08/14/21 1618   ? ?  ? PT LONG TERM GOAL #1  ? Title Independent with a HEP.   ? Baseline No knowledge of appropriate ther ex.   ? Time 6   ? Period Weeks   ? Status New   ?  ? PT LONG TERM GOAL #2  ? Title Patient walk in clinic 250 feet with a straight cane with CGA.   ? Baseline The patient is currently walking with HHA and she is at  risk for falls.   ? Time 6   ? Period Weeks   ? Status New   ?  ? PT LONG TERM GOAL #4  ? Title Solid negative Romberg   ? Baseline Romberg test was wobbly and supervision is required.   ? Time 6   ? Period Weeks   ? Status New   ? ?  ?  ? ?  ? ? ? ? ? ? ? ? Plan - 10/07/21 1749   ? ? Clinical Impression Statement The patient continues to be highly motivated.  Her balance in the parallel bars today was very good on the inverted BOSU ball with only occasional fingertips touches to maintain her balanace.  Recommend supervised gait, however, as patient still hs to "think" about lifting left ankle during the gait cycle.   ? Personal Factors and Comorbidities Comorbidity 1;Comorbidity 2;Comorbidity 3+;Other   ? Comorbidities Chronic pain syndrome, COPD, DDD, headaches, HTN, OSA, Thoracalgia, three cervical surgeries.   ? Examination-Activity Limitations Other;Locomotion Level   ? Examination-Participation Restrictions Other   ? Stability/Clinical Decision Making Stable/Uncomplicated   ? Rehab Potential Good   ? PT Frequency 2x / week   ? PT Duration 6 weeks   ? PT Treatment/Interventions ADLs/Self Care Home Management;Neuromuscular re-education;Balance training;Therapeutic exercise;Therapeutic activities;Functional mobility training;Gait training;Patient/family education;Stair training   ? PT Next Visit Plan D/c next visit.  HEP.   ? Consulted and Agree with Plan of Care Patient   ? ?  ?  ? ?  ? ? ?Patient will benefit from skilled therapeutic  intervention in order to improve  the following deficits and impairments:  Pain, Abnormal gait, Decreased activity tolerance, Decreased balance, Decreased mobility, Decreased strength, Difficulty walking, Postural dysfunction ? ?Visit Diagnosis: ?Muscle weakness (generalized) ? ?Unsteadiness on feet ? ? ? ? ?Problem List ?Patient Active Problem List  ? Diagnosis Date Noted  ? 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 Myelopathy ([place]) 03/10/2021  ? HNP (herniated nucleus pulposus) with myelopathy, cervical 12/18/2020  ? Cervical myelopathy ([place]) 08/13/2020  ? Cervical spinal cord compression ([place]) 08/08/2020  ? Chronic respiratory failure with hypoxia ([place]) 08/08/2020  ? Type 2 diabetes mellitus with diabetic polyneuropathy, without long-term current use of insulin ([place]) 08/08/2020  ? Fibromyalgia 01/06/2019  ? Neuropathy 01/06/2019  ? Overweight 03/11/2018  ? COPD (chronic obstructive pulmonary disease) ([place]) 03/11/2018  ? Mixed diabetic hyperlipidemia associated with type 2 diabetes mellitus ([place]) 03/11/2018  ? PTSD (post-traumatic stress disorder) 03/11/2018  ? GAD (generalized anxiety disorder) 01/30/2014  ? Essential hypertension, benign 01/30/2014  ? Hyperlipidemia 08/10/2012  ? Tobacco abuse 08/10/2012  ? GERD 12/24/2009  ? ? ?Jocelyn Nold, [place], PT ?10/07/2021, 5:54 PM ? ?[place] ?[facility] ?[address] ?[place], [place], [address] ?Phone: [phone]   Fax:  [phone] ? ?Name: Margaret Vaughn ?MRN: [id number] ?Date of Birth: 03-07-1961 ? ? ? ?

## 2021-10-09 ENCOUNTER — Other Ambulatory Visit: Payer: Self-pay

## 2021-10-09 ENCOUNTER — Ambulatory Visit: Payer: Medicaid Other | Admitting: Physical Therapy

## 2021-10-09 DIAGNOSIS — M6281 Muscle weakness (generalized): Secondary | ICD-10-CM | POA: Diagnosis not present

## 2021-10-09 DIAGNOSIS — R2681 Unsteadiness on feet: Secondary | ICD-10-CM | POA: Diagnosis not present

## 2021-10-09 NOTE — Therapy (Addendum)
[facility] [place], [place], [address] Phone: [phone]   Fax:  [phone]  Physical Therapy Treatment  Patient Details  Name: Margaret Vaughn MRN: [id number] Date of Birth: 05-20-1961 Referring Provider (PT): Kary Kos MD   Encounter Date: 10/09/2021   PT End of Session - 10/09/21 1805     Visit Number 10    Number of Visits 12    Date for PT Re-Evaluation 10/09/21    PT Start Time 4270    PT Stop Time 0550    PT Time Calculation (min) 60 min             Past Medical History:  Diagnosis Date   Acid reflux    Allergies    Anemia    Asthma    Bronchitis    Cervical stenosis of spine    Chronic pain syndrome    COPD (chronic obstructive pulmonary disease) ([place])    DDD (degenerative disc disease), lumbar    Depression with anxiety    Diabetes mellitus without complication ([place])    Dyspnea    Dysrhythmia    PSVT   Fatigue    H/O blood clots    H/O echocardiogram 1989   Per pt, done @ [facility] mitral valve prolapse   Headache(784.0)    Heart murmur    Hemorrhoids, external    History of hiatal hernia    History of kidney stones    History of PSVT (paroxysmal supraventricular tachycardia)    HTN (hypertension)    Hyperlipidemia    Hypoxemia    Insomnia    Memory loss    Myalgia and myositis    Narcolepsy    On home O2    2-4 L @ HS   OSA (obstructive sleep apnea)    Pneumonia    Sleeping difficulty    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 as a child   OOPHORECTOMY     POSTERIOR CERVICAL FUSION/FORAMINOTOMY N/A 08/07/2020   Procedure: POSTERIOR CERVICAL LAMINECTOMY FOR MYELOPATHY;  Surgeon: Kary Kos, MD;  Location: [facility];  Service: Neurosurgery;  Laterality: N/A;   POSTERIOR CERVICAL FUSION/FORAMINOTOMY N/A 03/10/2021   Procedure: Posterior cervical fusion with lateral mass fixation Cervical one - cervical four;  Surgeon: Kary Kos, MD;  Location: [facility];  Service: Neurosurgery;  Laterality: N/A;   POSTERIOR CERVICAL LAMINECTOMY  08/07/2020   w/ Dr. Saintclair Halsted   tonsillectomy     TONSILLECTOMY     TUBAL LIGATION      There were no vitals filed for this visit.   Subjective Assessment - 10/09/21 1806     Subjective Last day.  Patient states she was trying to stand up from her bad and fell but she is okay.    Patient is accompained by: Family member    Pertinent History Chronic pain syndrome, COPD, DDD, headaches, HTN, OSA, Thoracalgia, three cervical surgeries.  [facility] Adult PT Treatment/Exercise - 10/09/21 0001       Exercises   Exercises Knee/Hip      Lumbar Exercises: Aerobic   Nustep Level x 25 minutes.      Lumbar Exercises: Machines for Strengthening   Cybex Knee Extension 10# x 3 minutes.    Cybex Knee Flexion 30# x 3 minutes.      Ankle Exercises: Seated   Other Seated Ankle Exercises Seated 2# ankle isolator x 5 minutes, all motions.                 Balance Exercises - 10/09/21 0001       Balance Exercises: Standing   Other Standing Exercises Inverted BOSU x 2 minutes in parallel bars while receiving ball tossess.                     PT Long Term Goals - 10/09/21 1807       PT LONG TERM GOAL #1   Title Independent with a HEP.    Time 6    Period Weeks    Status Achieved      PT LONG TERM GOAL #2   Title Patient walk in clinic 250 feet with a straight cane with CGA.    Baseline  Recommend HHA due to fall risk or assistive device.    Time 6    Period Weeks    Status New      PT LONG TERM GOAL #4   Title Solid negative Romberg    Baseline Improved but supervision still required.    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[place]) 09/10/2021   Myelopathy ([place]) 03/10/2021   HNP (herniated nucleus pulposus) with myelopathy, cervical 12/18/2020   Cervical myelopathy ([place]) 08/13/2020   Cervical spinal cord compression ([place]) 08/08/2020   Chronic respiratory failure with hypoxia ([place]) 08/08/2020   Type 2 diabetes mellitus with diabetic polyneuropathy, without long-term current use of insulin ([place]) 08/08/2020   Fibromyalgia 01/06/2019   Neuropathy 01/06/2019   Overweight 03/11/2018   COPD (chronic obstructive pulmonary disease) ([place]) 03/11/2018   Mixed diabetic hyperlipidemia associated with type 2 diabetes mellitus ([place]) 03/11/2018   PTSD (post-traumatic stress disorder) 03/11/2018   GAD (generalized anxiety disorder) 01/30/2014   Essential hypertension, benign 01/30/2014   Hyperlipidemia 08/10/2012   Tobacco abuse 08/10/2012   GERD 12/24/2009   PHYSICAL THERAPY DISCHARGE SUMMARY  Visits from Start of Care: 10.  Current functional level related to goals / functional outcomes: See goal section.  Patient was highly motivated during all her PT sessions.   Remaining deficits: Continued fall risk.  Requires HHA or assistive device for safety.   Education / Equipment: HEP.   Patient agrees to discharge. Patient goals were partially met. Patient is being discharged due to   .  Jaki Hammerschmidt, [place], PT 10/09/2021, 6:13 PM  [facility] [address] [place], [place], [address] Phone: [phone]   Fax:  [phone]  Name: Margaret Vaughn MRN: [id number] Date of Birth: July 05, 1961

## 2021-10-21 DIAGNOSIS — Z0289 Encounter for other administrative examinations: Secondary | ICD-10-CM

## 2021-10-25 DIAGNOSIS — R0902 Hypoxemia: Secondary | ICD-10-CM | POA: Diagnosis not present

## 2021-10-26 ENCOUNTER — Other Ambulatory Visit: Payer: Self-pay | Admitting: Family Medicine

## 2021-11-07 DIAGNOSIS — Z79899 Other long term (current) drug therapy: Secondary | ICD-10-CM | POA: Diagnosis not present

## 2021-11-13 DIAGNOSIS — Z79899 Other long term (current) drug therapy: Secondary | ICD-10-CM | POA: Diagnosis not present

## 2021-11-24 DIAGNOSIS — R0902 Hypoxemia: Secondary | ICD-10-CM | POA: Diagnosis not present

## 2021-12-05 DIAGNOSIS — Z79899 Other long term (current) drug therapy: Secondary | ICD-10-CM | POA: Diagnosis not present

## 2021-12-10 DIAGNOSIS — Z79899 Other long term (current) drug therapy: Secondary | ICD-10-CM | POA: Diagnosis not present

## 2021-12-18 ENCOUNTER — Ambulatory Visit: Payer: Medicaid Other | Admitting: Family Medicine

## 2021-12-18 ENCOUNTER — Encounter: Payer: Self-pay | Admitting: Family Medicine

## 2021-12-18 VITALS — BP 121/69 | HR 88 | Temp 98.0°F | Ht 64.0 in | Wt 146.0 lb

## 2021-12-18 DIAGNOSIS — Z23 Encounter for immunization: Secondary | ICD-10-CM

## 2021-12-18 DIAGNOSIS — J441 Chronic obstructive pulmonary disease with (acute) exacerbation: Secondary | ICD-10-CM | POA: Diagnosis not present

## 2021-12-18 DIAGNOSIS — E1142 Type 2 diabetes mellitus with diabetic polyneuropathy: Secondary | ICD-10-CM

## 2021-12-18 DIAGNOSIS — J9611 Chronic respiratory failure with hypoxia: Secondary | ICD-10-CM

## 2021-12-18 DIAGNOSIS — E782 Mixed hyperlipidemia: Secondary | ICD-10-CM

## 2021-12-18 DIAGNOSIS — J449 Chronic obstructive pulmonary disease, unspecified: Secondary | ICD-10-CM

## 2021-12-18 DIAGNOSIS — G629 Polyneuropathy, unspecified: Secondary | ICD-10-CM

## 2021-12-18 DIAGNOSIS — I10 Essential (primary) hypertension: Secondary | ICD-10-CM

## 2021-12-18 DIAGNOSIS — E1149 Type 2 diabetes mellitus with other diabetic neurological complication: Secondary | ICD-10-CM

## 2021-12-18 DIAGNOSIS — E1169 Type 2 diabetes mellitus with other specified complication: Secondary | ICD-10-CM

## 2021-12-18 LAB — BAYER DCA HB A1C WAIVED: HB A1C (BAYER DCA - WAIVED): 5.7 % — ABNORMAL HIGH (ref 4.8–5.6)

## 2021-12-18 MED ORDER — FLUTICASONE-SALMETEROL 100-50 MCG/ACT IN AEPB: INHALATION_SPRAY | RESPIRATORY_TRACT | 1 refills | Status: DC

## 2021-12-18 MED ORDER — ALBUTEROL SULFATE HFA 108 (90 BASE) MCG/ACT IN AERS: INHALATION_SPRAY | RESPIRATORY_TRACT | 3 refills | Status: DC

## 2021-12-18 MED ORDER — SPIRIVA HANDIHALER 18 MCG IN CAPS: ORAL_CAPSULE | RESPIRATORY_TRACT | 3 refills | Status: DC

## 2021-12-18 NOTE — Progress Notes (Signed)
BP 121/69   Pulse 88   Temp 98 F (36.7 C)   Ht '5\' 4"'$  (1.626 m)   Wt 146 lb (66.2 kg)   SpO2 (!) 89%   BMI 25.06 kg/m    Subjective:   Patient ID: Margaret Vaughn, female    DOB: April 06, 1961, 61 y.o.   MRN: [id number]  HPI: Margaret Vaughn is a 61 y.o. female presenting on 12/18/2021 for Medical Management of Chronic Issues, Diabetes, and Discuss home O2   HPI Type 2 diabetes mellitus Patient comes in today for recheck of his diabetes. Patient has been currently taking Janumet. Patient is currently on an ACE inhibitor/ARB. Patient has not seen an ophthalmologist this year. Patient denies any issues with their feet. The symptom started onset as an adult hyperlipidemia and COPD and neuropathy ARE RELATED TO DM   Hyperlipidemia Patient is coming in for recheck of his hyperlipidemia. The patient is currently taking atorvastatin and fish oils. They deny any issues with myalgias or history of liver damage from it. They deny any focal numbness or weakness or chest pain.   COPD and chronic respiratory failure Patient is coming in for COPD recheck today.  He is currently on Spiriva and Wixela and Ventolin and 2 L nasal cannula.  He has a mild chronic cough but denies any major coughing spells or wheezing spells.  He has 7 nighttime symptoms per week and 7 daytime symptoms per week currently.  She uses oxygen during the day and oxygen with BiPAP at night.  Relevant past medical, surgical, family and social history reviewed and updated as indicated. Interim medical history since our last visit reviewed. Allergies and medications reviewed and updated.  Review of Systems  Constitutional:  Negative for chills and fever.  HENT:  Positive for congestion. Negative for ear discharge and ear pain.   Eyes:  Negative for redness and visual disturbance.  Respiratory:  Positive for cough, shortness of breath and wheezing. Negative for chest tightness.   Cardiovascular:  Negative for chest pain and  leg swelling.  Genitourinary:  Negative for difficulty urinating and dysuria.  Musculoskeletal:  Negative for back pain and gait problem.  Skin:  Negative for rash.  Neurological:  Negative for dizziness, light-headedness and headaches.  Psychiatric/Behavioral:  Negative for agitation and behavioral problems.   All other systems reviewed and are negative.  Per HPI unless specifically indicated above   Allergies as of 12/18/2021       Reactions   Iohexol Anaphylaxis   PT.GIVEN OMNI 300 FOR CT ANGIO WAS GIVEN 50 MG.BENADRYL PRIOR TO SCAN HAD NO PROBLEMS   Moxifloxacin Anaphylaxis   Shellfish Allergy Hives        Medication List        Accurate as of Dec 18, 2021  2:59 PM. If you have any questions, ask your nurse or doctor.          albuterol 108 (90 Base) MCG/ACT inhaler Commonly known as: Ventolin HFA INHALE 2 PUFFS BY MOUTH EVERY 6 HOURS AS NEEDED FOR WHEEZE OR SHORTNESS OF BREATH   atorvastatin 40 MG tablet Commonly known as: LIPITOR Take 1 tablet (40 mg total) by mouth daily.   baclofen 10 MG tablet Commonly known as: LIORESAL TAKE 1 TABLET BY MOUTH THREE TIMES A DAY AS NEEDED FOR MUSCLE SPASMS   cholecalciferol 25 MCG (1000 UNIT) tablet Commonly known as: VITAMIN D3 Take 1,000 Units by mouth daily.   cyclobenzaprine 10 MG tablet Commonly known as: FLEXERIL Take  10 mg by mouth 3 (three) times daily.   DULoxetine 30 MG capsule Commonly known as: CYMBALTA TAKE 1 CAPSULE BY MOUTH EVERY DAY   enalapril 5 MG tablet Commonly known as: VASOTEC Take 1 tablet (5 mg total) by mouth daily.   esomeprazole 40 MG capsule Commonly known as: NEXIUM Take 1 capsule (40 mg total) by mouth daily.   Fish Oil 1000 MG Caps Take 4,000 mg by mouth daily.   fluticasone-salmeterol 100-50 MCG/ACT Aepb Commonly known as: Wixela Inhub INHALE 1 PUFF INTO THE LUNGS TWICE A DAY   guaiFENesin 600 MG 12 hr tablet Commonly known as: MUCINEX Take 1,200 mg by mouth 2 (two) times  daily.   HAIR SKIN NAILS PO Take 3 tablets by mouth daily.   multivitamin with minerals tablet Take 1 tablet by mouth daily.   Janumet 50-1000 MG tablet Generic drug: sitaGLIPtin-metformin TAKE 1 TABLET BY MOUTH TWICE A DAY WITH A MEAL   methocarbamol 500 MG tablet Commonly known as: Robaxin Take 1 tablet (500 mg total) by mouth 4 (four) times daily.   nicotine 21 mg/24hr patch Commonly known as: NICODERM CQ - dosed in mg/24 hours Place 21 mg onto the skin daily as needed (smoking cessation).   nitrofurantoin 100 MG capsule Commonly known as: MACRODANTIN Take 100 mg by mouth daily.   oxyCODONE 10 mg 12 hr tablet Commonly known as: OXYCONTIN Take 1 tablet (10 mg total) by mouth 2 (two) times daily.   oxyCODONE-acetaminophen 10-325 MG tablet Commonly known as: PERCOCET Take 1 tablet by mouth 4 (four) times daily as needed for pain.   pregabalin 150 MG capsule Commonly known as: LYRICA Take 1 capsule (150 mg total) by mouth 3 (three) times daily.   rizatriptan 10 MG disintegrating tablet Commonly known as: MAXALT-MLT Take 1 tablet (10 mg total) by mouth as needed for migraine. May repeat in 2 hours if needed   Spiriva HandiHaler 18 MCG inhalation capsule Generic drug: tiotropium INHALE CONTENTS OF 1 CAPSULE VIA HANDIHALER ONCE DAILY AT SAME TIME EVERY DAY What changed:  how much to take how to take this when to take this additional instructions   Xtampza ER 13.5 MG C12a Generic drug: oxyCODONE ER Take 13.5 mg by mouth 2 (two) times daily.               Durable Medical Equipment  (From admission, onward)           Start     Ordered   12/18/21 0000  For home use only DME oxygen       Comments: Diagnosis COPD and chronic respiratory failure with hypoxia Portable oxygen concentrator Portable oxygen tanks  Question Answer Comment  Length of Need Lifetime   Mode or (Route) Nasal cannula   Liters per Minute 2   Oxygen delivery system Gas       12/18/21 1433             Objective:   BP 121/69   Pulse 88   Temp 98 F (36.7 C)   Ht '5\' 4"'$  (1.626 m)   Wt 146 lb (66.2 kg)   SpO2 (!) 89%   BMI 25.06 kg/m   Wt Readings from Last 3 Encounters:  12/18/21 146 lb (66.2 kg)  09/10/21 141 lb (64 kg)  03/10/21 140 lb (63.5 kg)    Physical Exam Vitals and nursing note reviewed.  Constitutional:      General: She is not in acute distress.    Appearance: She  is well-developed. She is not diaphoretic.  Eyes:     Conjunctiva/sclera: Conjunctivae normal.  Cardiovascular:     Rate and Rhythm: Normal rate and regular rhythm.     Heart sounds: Normal heart sounds. No murmur heard. Pulmonary:     Effort: Pulmonary effort is normal. No respiratory distress.     Breath sounds: Wheezing and rhonchi present. No rales.  Chest:     Chest wall: No tenderness.  Musculoskeletal:        General: No swelling or tenderness. Normal range of motion.  Skin:    General: Skin is warm and dry.     Findings: No rash.  Neurological:     Mental Status: She is alert and oriented to person, place, and time.     Coordination: Coordination normal.  Psychiatric:        Behavior: Behavior normal.      Assessment & Plan:   Problem List Items Addressed This Visit       Cardiovascular and Mediastinum   Essential hypertension, benign     Respiratory   COPD (chronic obstructive pulmonary disease) (H[facility])   Relevant Medications   tiotropium (SPIRIVA HANDIHALER) 18 MCG inhalation capsule   albuterol (VENTOLIN HFA) 108 (90 Base) MCG/ACT inhaler   fluticasone-salmeterol (WIXELA INHUB) 100-50 MCG/ACT AEPB   Other Relevant Orders   For home use only DME oxygen   Chronic respiratory failure with hypoxia ([place])   Relevant Orders   For home use only DME oxygen     Endocrine   Mixed diabetic hyperlipidemia associated with type 2 diabetes mellitus ([place])   Type 2 diabetes mellitus with diabetic polyneuropathy, without long-term current use of insulin  (H[facility])     Nervous and Auditory   Neuropathy     Other   Hyperlipidemia   Other Visit Diagnoses     Type 2 diabetes mellitus with neurological complications (H[facility])    -  Primary   Relevant Orders   Bayer DCA Hb A1c Waived   COPD exacerbation ([facility])       Relevant Medications   tiotropium (SPIRIVA HANDIHALER) 18 MCG inhalation capsule   albuterol (VENTOLIN HFA) 108 (90 Base) MCG/ACT inhaler   fluticasone-salmeterol (WIXELA INHUB) 100-50 MCG/ACT AEPB   Other Relevant Orders   For home use only DME oxygen   Need for shingles vaccine       Relevant Orders   Varicella-zoster vaccine IM (Shingrix)       Oxygen testing showed that she dipped down to 86% on room air on exertion and then came back up to 96% on 2 L nasal cannula during exertion.  She was 89% on room air at resting.   Follow up plan: Return in about 3 months (around 03/20/2022), or if symptoms worsen or fail to improve, for Diabetes and COPD recheck.  Counseling provided for all of the vaccine components Orders Placed This Encounter  Procedures   For home use only DME oxygen   Varicella-zoster vaccine IM (Shingrix)   Bayer DCA Hb A1c [place], MD [facility] 12/18/2021, 2:59 PM

## 2021-12-18 NOTE — Progress Notes (Signed)
Order for Oxygen sent by community message in [place] to [place]. CC'd to [employer] too

## 2021-12-19 DIAGNOSIS — J9601 Acute respiratory failure with hypoxia: Secondary | ICD-10-CM | POA: Diagnosis not present

## 2021-12-19 DIAGNOSIS — J449 Chronic obstructive pulmonary disease, unspecified: Secondary | ICD-10-CM | POA: Diagnosis not present

## 2021-12-23 ENCOUNTER — Telehealth: Payer: Self-pay | Admitting: Family Medicine

## 2021-12-24 NOTE — Telephone Encounter (Signed)
Spoke with patient and informed that we would fax letter to [employer] stating that she requires a smaller portable O2 concentrator due to her back and neck issues.

## 2021-12-24 NOTE — Telephone Encounter (Signed)
I am fine with writing the letter that says that she cannot carry anything larger than a small portable oxygen concentrator because of back issues.  We did already do the order for a portable oxygen concentrator for her in the last visit so that should count as her face-to-face.  I guess the husband says he wants to speak with [place] before we write a statement or maybe he wants [place] to review with him.  I am okay with going ahead and doing a letter stating these things.

## 2021-12-25 DIAGNOSIS — R0902 Hypoxemia: Secondary | ICD-10-CM | POA: Diagnosis not present

## 2021-12-25 NOTE — Telephone Encounter (Signed)
Letter faxed to [facility] [phone] along with OV note, and insurance card

## 2021-12-29 ENCOUNTER — Ambulatory Visit: Payer: Medicaid Other | Admitting: Gastroenterology

## 2021-12-30 ENCOUNTER — Telehealth: Payer: Self-pay | Admitting: Family Medicine

## 2021-12-31 NOTE — Telephone Encounter (Signed)
LMOVM that I have sent a message to my my reps at [facility] to look over the order from 12/18/21 and letter written on 12/24/21 if this is all the information needed for pt's POC and if there is anything else that is needed.

## 2022-01-02 DIAGNOSIS — Z79899 Other long term (current) drug therapy: Secondary | ICD-10-CM | POA: Diagnosis not present

## 2022-01-06 NOTE — Progress Notes (Signed)
GI Office Note    Referring Provider: Dettinger, Fransisca Kaufmann, MD Primary Care Physician:  Dettinger, Fransisca Kaufmann, MD  Primary Gastroenterologist: Dr. Gala Romney  Chief Complaint   Chief Complaint  Patient presents with   Abdominal Pain    Both constipation and diarrhea, has not been in several days. Sharp pains in lower left stomach, nauseated      History of Present Illness   Margaret Vaughn is a 61 y.o. female presenting today at the request of Dettinger, Fransisca Kaufmann, MD for reflux and constipation.   History of GERD and chronic constipation. Seen by Dr. Gala Romney in 2011 and referred to [facility] for endoscopic evaluation as anesthesia at [facility]  felt like she was too high risk to have procedures performed there.   EGD -May 14, 1989 performed by Dr. Leanora Cover with 2 cm hiatal hernia and mild reflux esophagitis, mild gastritis, mild duodenitis (decreased function of LES?)  Last EGD September 2011 with [facility] - biopsies of esophagus with benign squamous mucosa with mild reactive epithelial changes (negative for eosinophilic esophagitis), erosive antral gastritis, biopsies taken of stomach revealed focal erosions and mild congestion negative for H. pylori, normal duodenum  Last colonoscopy September 2011 with [facility] - 2 mm descending colon sessile polyp (hyperplastic).  Today: GERD/Nausea - Lack of appetite, does have early satiety. Doesn't eat much and then she is done. Has intermittent sour taste in her mouth but not as bad as previously. Sleeps mostly on her left side due to this. Was losing weight already before her mobility issues last year and she had a spinal surgery. Surgery in January, May, and then August. Reports since then her weight has been stable. Not having issues swallowing currently. Had quit taking nexium because she was taking other medications. Nausea comes about 2-3 times a week, intermittent vomiting.   Constipation - What does come out is small, does not eat much. Does  have to strain at times. Does have associated abdominal pain and nausea. No melena or hematochezia. Does have some hemorrhoids now - sometimes itch. Does not use anything over the counter other than tucks pads that sometimes helps. Has tried dulcolax which doesn't really work and has tried stool softeners. Does take oxycontin on a regular basis, 10 years or so. Does have diabetes - states her diabetes has been well controlled with A1c 5.5. Has tried miralax in the past that sometimes worked well for her but would need 4 doses in one glass of water.   BP runs typically 110-120s/60s. Recently had her opioid dose increased.    Past Medical History:  Diagnosis Date   Acid reflux    Allergies    Anemia    Asthma    Bronchitis    Cervical stenosis of spine    Chronic pain syndrome    COPD (chronic obstructive pulmonary disease) (HCC)    DDD (degenerative disc disease), lumbar    Depression with anxiety    Diabetes mellitus without complication ([place])    Dyspnea    Dysrhythmia    PSVT   Fatigue    H/O blood clots    H/O echocardiogram 1989   Per pt, done @ [place] mitral valve prolapse   Headache(784.0)    Heart murmur    Hemorrhoids, external    History of hiatal hernia    History of kidney stones    History of PSVT (paroxysmal supraventricular tachycardia)    HTN (hypertension)    Hyperlipidemia  Hypoxemia    Insomnia    Memory loss    Myalgia and myositis    Narcolepsy    On home O2    2-4 L @ HS   OSA (obstructive sleep apnea)    Pneumonia    Sleeping difficulty    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 as a child   OOPHORECTOMY     POSTERIOR CERVICAL FUSION/FORAMINOTOMY N/A 08/07/2020   Procedure: POSTERIOR CERVICAL LAMINECTOMY FOR MYELOPATHY;  Surgeon: Kary Kos, MD;  Location: [place];  Service: Neurosurgery;  Laterality: N/A;   POSTERIOR CERVICAL FUSION/FORAMINOTOMY N/A 03/10/2021   Procedure: Posterior cervical fusion with lateral mass fixation Cervical one - cervical four;  Surgeon: Kary Kos, MD;  Location: [place];  Service: Neurosurgery;  Laterality: N/A;   POSTERIOR CERVICAL LAMINECTOMY  08/07/2020   w/ Dr. Saintclair Halsted   tonsillectomy     TONSILLECTOMY     TUBAL LIGATION      Current Outpatient Medications  Medication Sig Dispense Refill   albuterol (VENTOLIN HFA) 108 (90 Base) MCG/ACT inhaler INHALE 2 PUFFS BY MOUTH EVERY 6 HOURS AS NEEDED FOR WHEEZE OR SHORTNESS OF BREATH 18 each 3   atorvastatin (LIPITOR) 40 MG tablet Take 1 tablet (40 mg total) by mouth daily. 90 tablet 3   baclofen (LIORESAL) 10 MG tablet TAKE 1 TABLET BY MOUTH THREE TIMES A DAY AS NEEDED FOR MUSCLE SPASMS 90 tablet 3   cholecalciferol (VITAMIN D3) 25 MCG (1000 UNIT) tablet Take 1,000 Units by mouth daily.     DULoxetine (CYMBALTA) 30 MG capsule TAKE 1 CAPSULE BY MOUTH EVERY DAY 90 capsule 3   enalapril (VASOTEC) 5 MG tablet Take 1 tablet (5 mg total) by mouth daily. 90 tablet 3   fluticasone-salmeterol (WIXELA INHUB) 100-50 MCG/ACT AEPB INHALE 1 PUFF INTO THE LUNGS TWICE A DAY 60 each 1   lubiprostone (AMITIZA) 24 MCG capsule Take 1 capsule (24 mcg total) by mouth 2 (two) times daily with a meal. 60 capsule 3   methocarbamol (ROBAXIN) 500 MG tablet Take 1 tablet (500 mg total) by mouth 4 (four) times daily. 45 tablet 0   Multiple Vitamins-Minerals (HAIR SKIN NAILS PO) Take 3 tablets by mouth daily.     Multiple Vitamins-Minerals (MULTIVITAMIN WITH MINERALS) tablet Take 1 tablet by mouth daily.      nicotine (NICODERM CQ - DOSED IN MG/24 HOURS) 21 mg/24hr patch Place 21 mg onto the skin daily as needed (smoking cessation).     Omega-3 Fatty Acids (FISH OIL) 1000 MG CAPS Take 4,000 mg by mouth daily.     oxyCODONE (OXYCONTIN) 10 mg 12 hr tablet Take 1 tablet (10 mg total) by mouth 2 (two) times daily. 14 tablet 0   pregabalin (LYRICA) 150 MG capsule Take 1 capsule (150 mg total) by mouth 3 (three) times daily. 90 capsule 2   rizatriptan (MAXALT-MLT) 10 MG disintegrating tablet Take 1 tablet (10 mg total) by mouth as needed for migraine.  May repeat in 2 hours if needed 9 tablet 11   sitaGLIPtin-metformin (JANUMET) 50-1000 MG tablet TAKE 1 TABLET BY MOUTH TWICE A DAY WITH A MEAL 180 tablet 3   tiotropium (SPIRIVA HANDIHALER) 18 MCG inhalation capsule INHALE CONTENTS OF 1 CAPSULE VIA HANDIHALER ONCE DAILY AT SAME TIME EVERY DAY 90 capsule 3   XTAMPZA ER 13.5 MG C12A Take 13.5 mg by mouth 2 (two) times daily.     esomeprazole (NEXIUM) 40 MG capsule Take 1 capsule (40 mg total) by mouth daily. 90 capsule 3   nitrofurantoin (MACRODANTIN) 100 MG capsule Take 100 mg by mouth daily.     No current facility-administered medications for this visit.    Allergies as of 01/08/2022 - Review Complete 01/08/2022  Allergen Reaction Noted   Iohexol Anaphylaxis 06/11/2004   Moxifloxacin Anaphylaxis    Shellfish allergy Hives 08/10/2012    Family History  Problem Relation Age of Onset   Ulcers Mother    Sleep apnea Mother    Migraines Mother    Cancer Mother        uterine   Arthritis Mother    Hypertension Mother    Heart disease Mother    Depression Mother    Hyperlipidemia Mother    Colon cancer Mother    Arrhythmia Son     Social History   Socioeconomic History   Marital status: Married    Spouse name: Not on file   Number of children: 5   Years of education: Not on file   Highest education level: Not on file  Occupational History   Occupation: disabled    Employer: UNEMPLOYED   Tobacco Use   Smoking status: Every Day    Packs/day: 2.00    Years: 30.00    Total pack years: 60.00    Types: Cigarettes   Smokeless tobacco: Never  Vaping Use   Vaping Use: Never used  Substance and Sexual Activity   Alcohol use: No   Drug use: No   Sexual activity: Yes    Birth control/protection: Surgical  Other Topics Concern   Not on file  Social History Narrative   Not on file   Social Determinants of Health   Financial Resource Strain: Not on file  Food Insecurity: Not on file  Transportation Needs: Not on file  Physical Activity: Not on file  Stress: Not on file  Social Connections: Not on file  Intimate Partner Violence: Not on file     Review of Systems   Gen: Denies any fever, chills, fatigue, weight loss, lack of appetite.  CV: Denies chest pain, heart palpitations, peripheral edema, syncope.  Resp: Denies shortness of breath at rest or with exertion. Denies wheezing or cough.  GI: see HPI GU : Denies urinary burning, urinary frequency, urinary hesitancy MS: Denies joint pain, muscle weakness, cramps, or limitation of movement.  Derm: Denies rash, itching, dry skin Psych: Denies depression, anxiety, memory loss, and confusion Heme: Denies bruising, bleeding, and enlarged lymph nodes.   Physical Exam   BP 95/61   Pulse (!) 105   Temp 97.6 F (36.4 C) (Temporal)   Ht '5\' 4"'$  (1.626 m)   Wt 147 lb 9.6 oz (67 kg)   BMI 25.34 kg/m   General:   Alert and oriented. Pleasant and cooperative. Well-nourished and well-developed.  Head:  Normocephalic and atraumatic. Limited neck flexion/extension Eyes:  Without icterus, sclera clear and conjunctiva pink.  Ears:  Normal auditory acuity. Mouth:  No deformity or lesions, oral  mucosa pink.  Lungs:  Clear to auscultation bilaterally. No wheezes, rales, or rhonchi. No distress.  Heart:  S1, S2 present without murmurs appreciated.  Abdomen:  +BS, soft, non-tender and non-distended. No HSM noted. No guarding  or rebound. No masses appreciated.  Rectal:  Deferred  Msk:  Symmetrical without gross deformities. Normal posture. Extremities:  Without edema. Neurologic:  Alert and  oriented x4;  grossly normal neurologically. Skin:  Intact without significant lesions or rashes. Psych:  Alert and cooperative. Normal mood and affect.   Assessment   Margaret Vaughn is a 61 y.o. female with a history of asthma, COPD (on 2 to 4 L nasal cannula at night), anemia, chronic pain, memory loss anxiety and depression, diabetes, tobacco abuse HTN, ?  Paroxysmal SVT?, HLD, syringomyelia presenting today for evaluation of uncontrolled reflux.  GERD/Nausea: Symptoms worse on her right side, usually sleeps on her left side.  Previously stopped taking Nexium because she was taking many other medications and was unsure if it was helping.  Last EGD with gastritis, previous history of erosive esophagitis.  Given her recent surgeries and chronic pain I discussed with her that it be a good idea to resume PPI therapy, she is agreeable.  She has associated nausea along with some lack appetite and early satiety.  Weight has been stable.  Denies dysphagia.  Nausea could be multifactorial in the setting of constipation as well as possible gastroparesis given diabetes and chronic opioid use.  Sent in Nexium 40 mg daily 30 minutes prior to breakfast.  We will further evaluate her reflux and nausea with EGD while she is undergoing colonoscopy.  Discussed that if EGD does not reveal any findings and nausea continues despite resuming PPI therapy that we should probably consider gastric emptying study to confirm gastroparesis.  Constipation: She reports she feels like she may not have much stool as she does not eat a lot.  She states that when she does go the bathroom is usually small ribbonlike bowel movements, not always a significant amount.  Stools are usually very soft, but does have difficulty with straining.  She does have some  intermittent left lower quadrant abdominal pain.  She is chronically on opioid therapy including OxyContin 10 mg every 12 hours (this was recently increased).  She also has diabetes that is fairly well controlled.  Suspect she has some dysmotility secondary to opioid use and diabetes. Patient states she has tried over-the-counter laxatives as well as previously doing 4 doses of MiraLAX and 1 cup of water without much relief in bowel movements.  Will trial Amitiza 24 mcg BID.  Also advised to increase fiber in her diet with 2 teaspoons Benefiber daily, handout provided.  History of colon polyps: Last colonoscopy in 2011 with hyperplastic polyp. Is having nausea with likely opioid induced constipation as above. Has had many spinal surgeries last year and limited neck mobility.  Patient is due for surveillance colonoscopy, will schedule procedure to be done with Dr. Gala Romney in the near future.  Patient will have EGD at same time.  Due to her recent nausea and constipation we will have her do an extra half day of clears and do a split prep the whole day prior to procedure to ensure an adequate prep.  We discussed medication adjustments including need to hold her oral diabetes medication night prior to morning of procedure.  PLAN   Nexium 40  mg daily 30 minutes prior to breakfast. Amitiza 24 mcg twice daily with food. Benefiber  2 teaspoons daily.  Proceed with upper endoscopy with propofol by Dr. Gala Romney in near future: the risks, benefits, and alternatives have been discussed with the patient in detail. The patient states understanding and desires to proceed. ASA 3 Split prep whole day prior to procedure Extra half day of clears Hold oral diabetes medication night prior and morning of procedure.  2 months post procedure follow-up Consider gastric emptying study in the future if EGD negative and PPI therapy ineffective.   Venetia Night, MSN, FNP-BC, AGACNP-BC [facility]

## 2022-01-08 ENCOUNTER — Encounter: Payer: Self-pay | Admitting: Gastroenterology

## 2022-01-08 ENCOUNTER — Ambulatory Visit: Payer: Medicaid Other | Admitting: Gastroenterology

## 2022-01-08 VITALS — BP 95/61 | HR 105 | Temp 97.6°F | Ht 64.0 in | Wt 147.6 lb

## 2022-01-08 DIAGNOSIS — K5903 Drug induced constipation: Secondary | ICD-10-CM

## 2022-01-08 DIAGNOSIS — Z86010 Personal history of colonic polyps: Secondary | ICD-10-CM

## 2022-01-08 DIAGNOSIS — R110 Nausea: Secondary | ICD-10-CM

## 2022-01-08 DIAGNOSIS — K219 Gastro-esophageal reflux disease without esophagitis: Secondary | ICD-10-CM | POA: Diagnosis not present

## 2022-01-08 MED ORDER — LUBIPROSTONE 24 MCG PO CAPS: 24.0000 ug | ORAL_CAPSULE | Freq: Two times a day (BID) | ORAL | 3 refills | Status: DC

## 2022-01-08 MED ORDER — ESOMEPRAZOLE MAGNESIUM 40 MG PO CPDR: 40.0000 mg | DELAYED_RELEASE_CAPSULE | Freq: Every day | ORAL | 3 refills | Status: DC

## 2022-01-08 NOTE — Patient Instructions (Addendum)
We are scheduling for upper endoscopy and colonoscopy with Dr. Gala Romney in the near future.  You will need to hold your diabetes medications the night prior to in the morning of your procedure.  We will do your prep the entire day prior to your procedure and do an extra half day of clears to avoid any vomiting with drinking the prep.  For your constipation: I have sent in Amitiza 24 mcg for you to take twice daily with meals. I also attached a handout regarding chronic constipation and good higher fiber diet options. I also recommend that you begin taking Benefiber 2 teaspoons daily, you may increase to twice daily after 2 weeks.  I suspect that your nausea is related to your constipation as well as possible uncontrolled reflux given that you have recently not been on Nexium.  I refilled your Nexium today and send it to the pharmacy.  Remember to not eat late at night prior to going to bed within 2 to 3 hours.  Also staying away from fatty or/greasy foods.  If you are nauseous try to stay with bland foods and I would encourage you to have some protein supplementation with boost/Ensure at least once daily.  We will have you follow-up 2 months post procedure  It was a pleasure to see you today. I want to create trusting relationships with patients. If you receive a survey regarding your visit,  I greatly appreciate you taking time to fill this out on paper or through your MyChart. I value your feedback.  Venetia Night, MSN, FNP-BC, AGACNP-BC [facility]

## 2022-01-09 ENCOUNTER — Ambulatory Visit: Payer: Medicaid Other | Admitting: Pulmonary Disease

## 2022-01-09 ENCOUNTER — Encounter: Payer: Self-pay | Admitting: Pulmonary Disease

## 2022-01-09 VITALS — BP 124/68 | HR 85 | Temp 98.4°F | Ht 64.0 in | Wt 142.0 lb

## 2022-01-09 DIAGNOSIS — J441 Chronic obstructive pulmonary disease with (acute) exacerbation: Secondary | ICD-10-CM | POA: Diagnosis not present

## 2022-01-09 DIAGNOSIS — J9611 Chronic respiratory failure with hypoxia: Secondary | ICD-10-CM

## 2022-01-09 DIAGNOSIS — F17210 Nicotine dependence, cigarettes, uncomplicated: Secondary | ICD-10-CM | POA: Diagnosis not present

## 2022-01-09 DIAGNOSIS — Z79899 Other long term (current) drug therapy: Secondary | ICD-10-CM | POA: Diagnosis not present

## 2022-01-09 DIAGNOSIS — J449 Chronic obstructive pulmonary disease, unspecified: Secondary | ICD-10-CM

## 2022-01-09 MED ORDER — FLUTICASONE-SALMETEROL 500-50 MCG/ACT IN AEPB: 1.0000 | INHALATION_SPRAY | Freq: Two times a day (BID) | RESPIRATORY_TRACT | 6 refills | Status: DC

## 2022-01-09 NOTE — Patient Instructions (Addendum)
Nice to see you  I increased the Wixela to highest dose  Continue Spiriva and albuterol as needed  Call [phone] quit now and ask for nicotine gum - should be free  Return to clinic in 3 months or sooner as needed

## 2022-01-12 NOTE — Progress Notes (Signed)
@Patient  ID: Margaret Vaughn, female    DOB: May 19, 1961, 61 y.o.   MRN: [id number]  Chief Complaint  Patient presents with   Consult    Pt is here for consult for COPD. Pt states she has had COPD for years. Pt is currently on Albuterol, mucinex, wixela, and sprivia. She states she feels like the medications are helping but she needs more medications sometimes     Referring provider: Jeanella Anton, NP  HPI:   61 y.o. woman whom are seen in consultation for dyspnea exertion and COPD.  Most recent GI note reviewed.  Most recent PCP note reviewed.  Patient was diagnosed with COPD sometime ago.  PFTs performed at pulmonary doctor office in [facility].  Reportedly no medication was prescribed to her given she was still smoking despite her symptoms as well as her diagnosis of COPD on PFTs.  Seen by PCP recently.  Started on inhalers.  Referred here for further evaluation.  She is on low-dose Wixela as well as Spiriva daily.  She does think the medication helps.  Dyspnea bit better.  Her chronic issues include dyspnea on exertion.  Worse on inclines or stairs.  Gradually getting worse.  No time of day when things are better or worse.  No position that makes things better or worse.  No seasonal environmental factors to make things better or worse.  She also has a cough.  Chronic.  Daily.  For years.  Productive of sputum.  Sometimes worse in the mornings.  Again the inhaler seem to be helping some with this.  She has been on oxygen.  Using 2 L.  She has chronic neurologic issues, weakness.  Due to cervical pathology.  Difficult to manipulate small tanks.  Unable to get POC through current oxygen provider.  Walk today.  Qualify for POC.  New order placed to different DME company.  PMH: Tobacco abuse, Hyperlipidemia, GERD, hypertension, migraines Surgical history: Hysterectomy, anterior cervical decompression, appendectomy, hernia repair, tubal ligation Family history: Mother with uterine  cancer, CAD, hyperlipidemia Social history: Current smoker, 60-pack-year, trying to reduce.  Quit for 2 months 12 years ago.  Then resumed.  Lives in [place] / Pulmonary Flowsheets:   ACT:      No data to display          MMRC:     No data to display          Epworth:      No data to display          Tests:   FENO:  No results found for: "NITRICOXIDE"  PFT:     No data to display          WALK:      No data to display          Imaging: Personally reviewed, most recent chest x-ray 2021 clear on my review interpretation No results found.  Lab Results: Personally reviewed CBC    Component Value Date/Time   WBC 12.9 (H) 09/10/2021 1103   WBC 9.3 03/06/2021 1600   RBC 5.82 (H) 09/10/2021 1103   RBC 4.89 03/06/2021 1600   HGB 17.6 (H) 09/10/2021 1103   HCT 52.0 (H) 09/10/2021 1103   PLT 372 09/10/2021 1103   MCV 89 09/10/2021 1103   MCH 30.2 09/10/2021 1103   MCH 29.4 03/06/2021 1600   MCHC 33.8 09/10/2021 1103   MCHC 32.6 03/06/2021 1600   RDW 14.6 09/10/2021 1103  LYMPHSABS 2.9 09/10/2021 1103   MONOABS 1.1 (H) 08/14/2020 0639   EOSABS 0.2 09/10/2021 1103   BASOSABS 0.1 09/10/2021 1103    BMET    Component Value Date/Time   NA 140 09/10/2021 1103   K 4.0 09/10/2021 1103   CL 98 09/10/2021 1103   CO2 26 09/10/2021 1103   GLUCOSE 116 (H) 09/10/2021 1103   GLUCOSE 92 03/06/2021 1600   BUN 8 09/10/2021 1103   CREATININE 0.72 09/10/2021 1103   CALCIUM 9.9 09/10/2021 1103   GFRNONAA >60 03/06/2021 1600   GFRAA 112 03/13/2020 1523    BNP No results found for: "BNP"  ProBNP No results found for: "PROBNP"  Specialty Problems       Pulmonary Problems   COPD (chronic obstructive pulmonary disease) (HCC)   Chronic respiratory failure with hypoxia (HCC)    Allergies  Allergen Reactions   Iohexol Anaphylaxis    PT.GIVEN OMNI 300 FOR CT ANGIO WAS GIVEN 50 MG.BENADRYL PRIOR TO SCAN HAD NO  PROBLEMS    Moxifloxacin Anaphylaxis   Shellfish Allergy Hives    Immunization History  Administered Date(s) Administered   Hepatitis B 05/11/1991, 06/08/1991, 11/08/1991   Influenza,inj,Quad PF,6+ Mos 06/26/2020   MMR 08/30/1990   Tdap 08/22/2015   Zoster Recombinat (Shingrix) 12/18/2021    Past Medical History:  Diagnosis Date   Acid reflux    Allergies    Anemia    Asthma    Bronchitis    Cervical stenosis of spine    Chronic pain syndrome    COPD (chronic obstructive pulmonary disease) (HCC)    DDD (degenerative disc disease), lumbar    Depression with anxiety    Diabetes mellitus without complication ([place])    Dyspnea    Dysrhythmia    PSVT   Fatigue    H/O blood clots    H/O echocardiogram 1989   Per pt, done @ [facility] mitral valve prolapse   Headache(784.0)    Heart murmur    Hemorrhoids, external    History of hiatal hernia    History of kidney stones    History of PSVT (paroxysmal supraventricular tachycardia)    HTN (hypertension)    Hyperlipidemia    Hypoxemia    Insomnia    Memory loss    Myalgia and myositis    Narcolepsy    On home O2    2-4 L @ HS   OSA (obstructive sleep apnea)    Pneumonia    Sleeping difficulty    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[place])    Thoracalgia    Tobacco abuse    Tremor    Ulcer disease     Tobacco History: Social History   Tobacco Use  Smoking Status Every Day   Packs/day: 2.00   Years: 30.00   Total pack years: 60.00   Types: Cigarettes  Smokeless Tobacco Never  Tobacco Comments   Pt states she smokes 2 ppd. 01/09/22   Ready to quit: Not Answered Counseling given: Not Answered Tobacco comments: Pt states she smokes 2 ppd. 01/09/22   Continue to not smoke  Outpatient Encounter Medications as of 01/09/2022  Medication Sig   albuterol (VENTOLIN HFA) 108 (90 Base) MCG/ACT inhaler INHALE 2 PUFFS BY MOUTH EVERY 6 HOURS AS NEEDED FOR WHEEZE OR SHORTNESS OF BREATH   atorvastatin (LIPITOR) 40 MG tablet  Take 1 tablet (40 mg total) by mouth daily.   baclofen (LIORESAL) 10 MG tablet TAKE 1 TABLET BY MOUTH THREE TIMES A DAY AS NEEDED  FOR MUSCLE SPASMS   cholecalciferol (VITAMIN D3) 25 MCG (1000 UNIT) tablet Take 1,000 Units by mouth daily.   DULoxetine (CYMBALTA) 30 MG capsule TAKE 1 CAPSULE BY MOUTH EVERY DAY   enalapril (VASOTEC) 5 MG tablet Take 1 tablet (5 mg total) by mouth daily.   esomeprazole (NEXIUM) 40 MG capsule Take 1 capsule (40 mg total) by mouth daily.   fluticasone-salmeterol (WIXELA INHUB) 500-50 MCG/ACT AEPB Inhale 1 puff into the lungs in the morning and at bedtime.   lubiprostone (AMITIZA) 24 MCG capsule Take 1 capsule (24 mcg total) by mouth 2 (two) times daily with a meal.   methocarbamol (ROBAXIN) 500 MG tablet Take 1 tablet (500 mg total) by mouth 4 (four) times daily.   Multiple Vitamins-Minerals (HAIR SKIN NAILS PO) Take 3 tablets by mouth daily.   Multiple Vitamins-Minerals (MULTIVITAMIN WITH MINERALS) tablet Take 1 tablet by mouth daily.   nicotine (NICODERM CQ - DOSED IN MG/24 HOURS) 21 mg/24hr patch Place 21 mg onto the skin daily as needed (smoking cessation).   nitrofurantoin (MACRODANTIN) 100 MG capsule Take 100 mg by mouth daily.   Omega-3 Fatty Acids (FISH OIL) 1000 MG CAPS Take 4,000 mg by mouth daily.   oxyCODONE (OXYCONTIN) 10 mg 12 hr tablet Take 1 tablet (10 mg total) by mouth 2 (two) times daily.   pregabalin (LYRICA) 150 MG capsule Take 1 capsule (150 mg total) by mouth 3 (three) times daily.   rizatriptan (MAXALT-MLT) 10 MG disintegrating tablet Take 1 tablet (10 mg total) by mouth as needed for migraine. May repeat in 2 hours if needed   sitaGLIPtin-metformin (JANUMET) 50-1000 MG tablet TAKE 1 TABLET BY MOUTH TWICE A DAY WITH A MEAL   tiotropium (SPIRIVA HANDIHALER) 18 MCG inhalation capsule INHALE CONTENTS OF 1 CAPSULE VIA HANDIHALER ONCE DAILY AT SAME TIME EVERY DAY   XTAMPZA ER 13.5 MG C12A Take 13.5 mg by mouth 2 (two) times daily.   [DISCONTINUED]  fluticasone-salmeterol (WIXELA INHUB) 100-50 MCG/ACT AEPB INHALE 1 PUFF INTO THE LUNGS TWICE A DAY   No facility-administered encounter medications on file as of 01/09/2022.     Review of Systems  Review of Systems  No chest pain with exertion.  No orthopnea or PND.  No lower extremity swelling.  Comprehensive review of systems otherwise negative. Physical Exam  BP 124/68 (BP Location: Left Arm, Patient Position: Sitting, Cuff Size: Normal)   Pulse 85   Temp 98.4 F (36.9 C) (Oral)   Ht 5' 4"  (1.626 m)   Wt 142 lb (64.4 kg)   SpO2 96%   BMI 24.37 kg/m   Wt Readings from Last 5 Encounters:  01/09/22 142 lb (64.4 kg)  01/08/22 147 lb 9.6 oz (67 kg)  12/18/21 146 lb (66.2 kg)  09/10/21 141 lb (64 kg)  03/10/21 140 lb (63.5 kg)    BMI Readings from Last 5 Encounters:  01/09/22 24.37 kg/m  01/08/22 25.34 kg/m  12/18/21 25.06 kg/m  09/10/21 24.20 kg/m  03/10/21 24.03 kg/m     Physical Exam General: Sitting in chair, no acute distress Eyes: EOMI, icterus Neck: Supple, no JVP Pulmonary: Distant, clear Cardiovascular: Warm, no edema Abdomen: Nondistended, bowel sounds present MSK: No synovitis, joint effusion Neuro: Normal gait, no weakness Psych: Normal mood, full affect   Assessment & Plan:   Dyspnea on exertion: Likely multifactorial largely given by underlying COPD due to cigarette smoking.  Likely contribution from deconditioning given decreased activity over time.  Mild improvement with low-dose ICS/LABA and Spiriva  daily.  Increase Wixela to high-dose.  Continue Wixela.  COPD: Suspect severe based on description of her PFTs.  I cannot review these.  Increase Wixela as above, continue Spiriva.  Polycythemia: Likely due to chronic hypoxemia.  Stressed importance of oxygen usage at all times with exertion.  Chronic hypoxemic respiratory failure: Mainly with exertion.  O2 sat okay with rest today.  Walked and qualify for POC device.  She is not using small  tanks due to her inability to manipulate due to her cervical myelopathy.  This likely contributes to worsening symptoms as well as polycythemia.  Stressed importance of keeping oxygen saturation greater than 88%.  Smoking assessment and cessation counseling I have advised the patient to quit/stop smoking as soon as possible due to high risk for multiple medical problems.  It will also be very difficult for [place] to manage patient's  respiratory symptoms and status if we continue to expose her lungs to a known irritant.  We do not advise e-cigarettes as a form of stopping smoking. Patient is  willing to quit smoking. I have advised the patient that we can assist and have options of nicotine replacement therapy, provided smoking cessation education today, provided smoking cessation counseling, and provided cessation resources.  Gradual gradual reducing daily cigarette use with nicotine gum replacement for breakthrough.  Advised to call 1 800 quit NOW for nicotine supplements. Follow-up next office visit office visit for assessment of smoking cessation.  I spent 8 minutes in smoking cessation counseling.    Return in about 3 months (around 04/11/2022).   Lanier Clam, MD 01/12/2022

## 2022-01-19 DIAGNOSIS — J449 Chronic obstructive pulmonary disease, unspecified: Secondary | ICD-10-CM | POA: Diagnosis not present

## 2022-01-19 DIAGNOSIS — J9601 Acute respiratory failure with hypoxia: Secondary | ICD-10-CM | POA: Diagnosis not present

## 2022-01-22 ENCOUNTER — Telehealth: Payer: Self-pay | Admitting: *Deleted

## 2022-01-22 NOTE — Telephone Encounter (Signed)
LMOVM to call back to schedule TCS/EGD w/ propofol asa 3

## 2022-01-24 DIAGNOSIS — R0902 Hypoxemia: Secondary | ICD-10-CM | POA: Diagnosis not present

## 2022-02-02 ENCOUNTER — Encounter: Payer: Self-pay | Admitting: *Deleted

## 2022-02-02 NOTE — Telephone Encounter (Signed)
Letter mailed

## 2022-02-06 DIAGNOSIS — Z79899 Other long term (current) drug therapy: Secondary | ICD-10-CM | POA: Diagnosis not present

## 2022-02-09 ENCOUNTER — Encounter: Payer: Self-pay | Admitting: Internal Medicine

## 2022-02-24 DIAGNOSIS — R0902 Hypoxemia: Secondary | ICD-10-CM | POA: Diagnosis not present

## 2022-03-06 DIAGNOSIS — Z79899 Other long term (current) drug therapy: Secondary | ICD-10-CM | POA: Diagnosis not present

## 2022-03-11 DIAGNOSIS — Z79899 Other long term (current) drug therapy: Secondary | ICD-10-CM | POA: Diagnosis not present

## 2022-03-20 ENCOUNTER — Encounter: Payer: Self-pay | Admitting: Family Medicine

## 2022-03-20 ENCOUNTER — Ambulatory Visit: Payer: Medicaid Other | Admitting: Family Medicine

## 2022-03-20 VITALS — BP 116/74 | HR 98 | Temp 98.6°F | Ht 64.0 in | Wt 144.0 lb

## 2022-03-20 DIAGNOSIS — E1169 Type 2 diabetes mellitus with other specified complication: Secondary | ICD-10-CM

## 2022-03-20 DIAGNOSIS — J449 Chronic obstructive pulmonary disease, unspecified: Secondary | ICD-10-CM

## 2022-03-20 DIAGNOSIS — I10 Essential (primary) hypertension: Secondary | ICD-10-CM

## 2022-03-20 DIAGNOSIS — G950 Syringomyelia and syringobulbia: Secondary | ICD-10-CM

## 2022-03-20 DIAGNOSIS — E1149 Type 2 diabetes mellitus with other diabetic neurological complication: Secondary | ICD-10-CM

## 2022-03-20 DIAGNOSIS — F411 Generalized anxiety disorder: Secondary | ICD-10-CM | POA: Diagnosis not present

## 2022-03-20 DIAGNOSIS — Z23 Encounter for immunization: Secondary | ICD-10-CM | POA: Diagnosis not present

## 2022-03-20 DIAGNOSIS — E1142 Type 2 diabetes mellitus with diabetic polyneuropathy: Secondary | ICD-10-CM | POA: Diagnosis not present

## 2022-03-20 DIAGNOSIS — E782 Mixed hyperlipidemia: Secondary | ICD-10-CM | POA: Diagnosis not present

## 2022-03-20 DIAGNOSIS — R5382 Chronic fatigue, unspecified: Secondary | ICD-10-CM

## 2022-03-20 LAB — BAYER DCA HB A1C WAIVED: HB A1C (BAYER DCA - WAIVED): 5.4 % (ref 4.8–5.6)

## 2022-03-20 MED ORDER — CYCLOBENZAPRINE HCL 10 MG PO TABS: 10.0000 mg | ORAL_TABLET | Freq: Every evening | ORAL | 1 refills | Status: DC | PRN

## 2022-03-20 MED ORDER — DULOXETINE HCL 30 MG PO CPEP: ORAL_CAPSULE | ORAL | 3 refills | Status: DC

## 2022-03-20 NOTE — Progress Notes (Unsigned)
BP 116/74   Pulse 98   Temp 98.6 F (37 C)   Ht 5' 4"  (1.626 m)   Wt 144 lb (65.3 kg)   SpO2 (!) 89%   BMI 24.72 kg/m    Subjective:   Patient ID: Margaret Vaughn, female    DOB: 1960-09-06, 61 y.o.   MRN: [id number]  HPI: Margaret Vaughn is a 61 y.o. female presenting on 03/20/2022 for Medical Management of Chronic Issues (3 month )   HPI Type 2 diabetes mellitus Patient comes in today for recheck of his diabetes. Patient has been currently taking Janumet, A1c looks good at 5.4. Patient is currently on an ACE inhibitor/ARB. Patient has not seen an ophthalmologist this year. Patient denies any issues with their feet. The symptom started onset as an adult hypertension and COPD ARE RELATED TO DM   Hypertension Patient is currently on enalapril, and their blood pressure today is 116/74. Patient denies any lightheadedness or dizziness. Patient denies headaches, blurred vision, chest pains, shortness of breath, or weakness. Denies any side effects from medication and is content with current medication.   COPD Patient is coming in for COPD recheck today.  He is currently on spiriva and wixela and albuterol and oxygen.  He has a mild chronic cough but denies any major coughing spells or wheezing spells.  He has 5nighttime symptoms per week and 5daytime symptoms per week currently.   Patient has lumbar disease and sees pain management but wants a refill of her muscle laxer cyclobenzaprine.  She says she is not taking any of the other muscle laxer  Relevant past medical, surgical, family and social history reviewed and updated as indicated. Interim medical history since our last visit reviewed. Allergies and medications reviewed and updated.  Review of Systems  Constitutional:  Negative for chills and fever.  Eyes:  Negative for visual disturbance.  Respiratory:  Negative for chest tightness and shortness of breath.   Cardiovascular:  Negative for chest pain and leg swelling.   Musculoskeletal:  Positive for arthralgias, back pain and myalgias. Negative for gait problem.  Skin:  Negative for rash.  Neurological:  Negative for light-headedness and headaches.  Psychiatric/Behavioral:  Negative for agitation and behavioral problems.   All other systems reviewed and are negative.   Per HPI unless specifically indicated above   Allergies as of 03/20/2022       Reactions   Iohexol Anaphylaxis   PT.GIVEN OMNI 300 FOR CT ANGIO WAS GIVEN 50 MG.BENADRYL PRIOR TO SCAN HAD NO PROBLEMS   Moxifloxacin Anaphylaxis   Shellfish Allergy Hives        Medication List        Accurate as of March 20, 2022  2:40 PM. If you have any questions, ask your nurse or doctor.          STOP taking these medications    baclofen 10 MG tablet Commonly known as: LIORESAL Stopped by: Fransisca Kaufmann Demiya Magno, MD   methocarbamol 500 MG tablet Commonly known as: Robaxin Stopped by: [name], MD       TAKE these medications    albuterol 108 (90 Base) MCG/ACT inhaler Commonly known as: Ventolin HFA INHALE 2 PUFFS BY MOUTH EVERY 6 HOURS AS NEEDED FOR WHEEZE OR SHORTNESS OF BREATH   atorvastatin 40 MG tablet Commonly known as: LIPITOR Take 1 tablet (40 mg total) by mouth daily.   cholecalciferol 25 MCG (1000 UNIT) tablet Commonly known as: VITAMIN D3 Take 1,000 Units by mouth  daily.   cyclobenzaprine 10 MG tablet Commonly known as: FLEXERIL Take 1 tablet (10 mg total) by mouth at bedtime as needed for muscle spasms. Started by: Fransisca Kaufmann Heath Tesler, MD   DULoxetine 30 MG capsule Commonly known as: CYMBALTA TAKE 1 CAPSULE BY MOUTH EVERY DAY   enalapril 5 MG tablet Commonly known as: VASOTEC Take 1 tablet (5 mg total) by mouth daily.   esomeprazole 40 MG capsule Commonly known as: NEXIUM Take 1 capsule (40 mg total) by mouth daily.   Fish Oil 1000 MG Caps Take 4,000 mg by mouth daily.   fluticasone-salmeterol 500-50 MCG/ACT Aepb Commonly known as:  Wixela Inhub Inhale 1 puff into the lungs in the morning and at bedtime.   HAIR SKIN NAILS PO Take 3 tablets by mouth daily.   multivitamin with minerals tablet Take 1 tablet by mouth daily.   Janumet 50-1000 MG tablet Generic drug: sitaGLIPtin-metformin TAKE 1 TABLET BY MOUTH TWICE A DAY WITH A MEAL   lubiprostone 24 MCG capsule Commonly known as: AMITIZA Take 1 capsule (24 mcg total) by mouth 2 (two) times daily with a meal.   nicotine 21 mg/24hr patch Commonly known as: NICODERM CQ - dosed in mg/24 hours Place 21 mg onto the skin daily as needed (smoking cessation).   nitrofurantoin 100 MG capsule Commonly known as: MACRODANTIN Take 100 mg by mouth daily.   oxyCODONE 10 mg 12 hr tablet Commonly known as: OXYCONTIN Take 1 tablet (10 mg total) by mouth 2 (two) times daily.   pregabalin 150 MG capsule Commonly known as: LYRICA Take 1 capsule (150 mg total) by mouth 3 (three) times daily.   rizatriptan 10 MG disintegrating tablet Commonly known as: MAXALT-MLT Take 1 tablet (10 mg total) by mouth as needed for migraine. May repeat in 2 hours if needed   Spiriva HandiHaler 18 MCG inhalation capsule Generic drug: tiotropium INHALE CONTENTS OF 1 CAPSULE VIA HANDIHALER ONCE DAILY AT SAME TIME EVERY DAY   Xtampza ER 13.5 MG C12a Generic drug: oxyCODONE ER Take 13.5 mg by mouth 2 (two) times daily.         Objective:   BP 116/74   Pulse 98   Temp 98.6 F (37 C)   Ht 5' 4"  (1.626 m)   Wt 144 lb (65.3 kg)   SpO2 (!) 89%   BMI 24.72 kg/m   Wt Readings from Last 3 Encounters:  03/20/22 144 lb (65.3 kg)  01/09/22 142 lb (64.4 kg)  01/08/22 147 lb 9.6 oz (67 kg)    Physical Exam Vitals and nursing note reviewed.  Constitutional:      General: She is not in acute distress.    Appearance: She is well-developed. She is not diaphoretic.  Eyes:     Conjunctiva/sclera: Conjunctivae normal.  Cardiovascular:     Rate and Rhythm: Normal rate and regular rhythm.      Heart sounds: Normal heart sounds. No murmur heard. Pulmonary:     Effort: Pulmonary effort is normal. No respiratory distress.     Breath sounds: Wheezing present. No rhonchi or rales.  Musculoskeletal:        General: No swelling or tenderness. Normal range of motion.  Skin:    General: Skin is warm and dry.     Findings: No rash.  Neurological:     Mental Status: She is alert and oriented to person, place, and time.     Coordination: Coordination normal.  Psychiatric:        Behavior:  Behavior normal.     A1c is 5.4 which looks good.  Diet controlled  Assessment & Plan:   Problem List Items Addressed This Visit       Cardiovascular and Mediastinum   Essential hypertension, benign     Respiratory   COPD (chronic obstructive pulmonary disease) ([place])     Endocrine   Mixed diabetic hyperlipidemia associated with type 2 diabetes mellitus ([place])   Type 2 diabetes mellitus with diabetic polyneuropathy, without long-term current use of insulin (HCC)   Relevant Medications   DULoxetine (CYMBALTA) 30 MG capsule   cyclobenzaprine (FLEXERIL) 10 MG tablet     Nervous and Auditory   Syringomyelia and syringobulbia (HCC)   Relevant Medications   DULoxetine (CYMBALTA) 30 MG capsule   cyclobenzaprine (FLEXERIL) 10 MG tablet     Other   Hyperlipidemia   GAD (generalized anxiety disorder)   Relevant Medications   DULoxetine (CYMBALTA) 30 MG capsule   Other Visit Diagnoses     Type 2 diabetes mellitus with neurological complications ([place])    -  Primary   Relevant Orders   Bayer DCA Hb A1c Waived   Chronic fatigue       Relevant Orders   Lipid panel   CMP14+EGFR   CBC with Differential/Platelet   Thyroid Panel With TSH   VITAMIN D 25 Hydroxy (Vit-D Deficiency, Fractures)   Need for varicella vaccine       Relevant Orders   Varicella-zoster vaccine subcutaneous       No change in medication except for radicular muscle relaxer. Follow up plan: Return in about 3 months  (around 06/20/2022), or if symptoms worsen or fail to improve, for Diabetes.  Counseling provided for all of the vaccine components Orders Placed This Encounter  Procedures   Varicella-zoster vaccine subcutaneous   Bayer DCA Hb A1c Waived   Lipid panel   CMP14+EGFR   CBC with Differential/Platelet   Thyroid Panel With TSH   VITAMIN D 25 Hydroxy (Vit-D Deficiency, Fractures)    Caryl Pina, MD [facility] 03/20/2022, 2:40 PM

## 2022-03-21 LAB — CBC WITH DIFFERENTIAL/PLATELET
Basophils Absolute: 0 10*3/uL (ref 0.0–0.2)
Basos: 0 %
EOS (ABSOLUTE): 0.2 10*3/uL (ref 0.0–0.4)
Eos: 2 %
Hematocrit: 50.3 % — ABNORMAL HIGH (ref 34.0–46.6)
Hemoglobin: 16.6 g/dL — ABNORMAL HIGH (ref 11.1–15.9)
Immature Grans (Abs): 0 10*3/uL (ref 0.0–0.1)
Immature Granulocytes: 0 %
Lymphocytes Absolute: 2.2 10*3/uL (ref 0.7–3.1)
Lymphs: 28 %
MCH: 30.6 pg (ref 26.6–33.0)
MCHC: 33 g/dL (ref 31.5–35.7)
MCV: 93 fL (ref 79–97)
Monocytes Absolute: 0.8 10*3/uL (ref 0.1–0.9)
Monocytes: 10 %
Neutrophils Absolute: 4.7 10*3/uL (ref 1.4–7.0)
Neutrophils: 60 %
Platelets: 252 10*3/uL (ref 150–450)
RBC: 5.43 x10E6/uL — ABNORMAL HIGH (ref 3.77–5.28)
RDW: 14.1 % (ref 11.7–15.4)
WBC: 7.9 10*3/uL (ref 3.4–10.8)

## 2022-03-21 LAB — CMP14+EGFR
ALT: 19 IU/L (ref 0–32)
AST: 19 IU/L (ref 0–40)
Albumin/Globulin Ratio: 1.7 (ref 1.2–2.2)
Albumin: 4 g/dL (ref 3.9–4.9)
Alkaline Phosphatase: 112 IU/L (ref 44–121)
BUN/Creatinine Ratio: 11 — ABNORMAL LOW (ref 12–28)
BUN: 8 mg/dL (ref 8–27)
Bilirubin Total: <0.2 mg/dL (ref 0.0–1.2)
CO2: 28 mmol/L (ref 20–29)
Calcium: 9.5 mg/dL (ref 8.7–10.3)
Chloride: 99 mmol/L (ref 96–106)
Creatinine, Ser: 0.76 mg/dL (ref 0.57–1.00)
Globulin, Total: 2.3 g/dL (ref 1.5–4.5)
Glucose: 139 mg/dL — ABNORMAL HIGH (ref 70–99)
Potassium: 4 mmol/L (ref 3.5–5.2)
Sodium: 140 mmol/L (ref 134–144)
Total Protein: 6.3 g/dL (ref 6.0–8.5)
eGFR: 89 mL/min/{1.73_m2} (ref >59)

## 2022-03-21 LAB — VITAMIN D 25 HYDROXY (VIT D DEFICIENCY, FRACTURES): Vit D, 25-Hydroxy: 52.5 ng/mL (ref 30.0–100.0)

## 2022-03-21 LAB — LIPID PANEL
Chol/HDL Ratio: 3.1 ratio (ref 0.0–4.4)
Cholesterol, Total: 118 mg/dL (ref 100–199)
HDL: 38 mg/dL — ABNORMAL LOW (ref >39)
LDL Chol Calc (NIH): 54 mg/dL (ref 0–99)
Triglycerides: 153 mg/dL — ABNORMAL HIGH (ref 0–149)
VLDL Cholesterol Cal: 26 mg/dL (ref 5–40)

## 2022-03-21 LAB — THYROID PANEL WITH TSH
Free Thyroxine Index: 2.2 (ref 1.2–4.9)
T3 Uptake Ratio: 28 % (ref 24–39)
T4, Total: 8 ug/dL (ref 4.5–12.0)
TSH: 0.743 u[IU]/mL (ref 0.450–4.500)

## 2022-03-27 DIAGNOSIS — R0902 Hypoxemia: Secondary | ICD-10-CM | POA: Diagnosis not present

## 2022-04-03 DIAGNOSIS — Z79899 Other long term (current) drug therapy: Secondary | ICD-10-CM | POA: Diagnosis not present

## 2022-04-09 ENCOUNTER — Telehealth: Payer: Self-pay | Admitting: *Deleted

## 2022-04-09 NOTE — Telephone Encounter (Signed)
Patient's husband had called and left vm regarding pt's procedures. She wants to be scheduled for November. Derek Mound (her husband) we do not have November schedule out at this time. We will call once we get November's schedule.

## 2022-04-09 NOTE — Telephone Encounter (Signed)
Pt's husband called.

## 2022-04-10 ENCOUNTER — Encounter: Payer: Self-pay | Admitting: Pulmonary Disease

## 2022-04-10 ENCOUNTER — Ambulatory Visit: Payer: Medicaid Other | Admitting: Pulmonary Disease

## 2022-04-10 DIAGNOSIS — J441 Chronic obstructive pulmonary disease with (acute) exacerbation: Secondary | ICD-10-CM | POA: Diagnosis not present

## 2022-04-10 MED ORDER — BUDESONIDE-FORMOTEROL FUMARATE 160-4.5 MCG/ACT IN AERO: 2.0000 | INHALATION_SPRAY | Freq: Two times a day (BID) | RESPIRATORY_TRACT | 12 refills | Status: DC

## 2022-04-10 MED ORDER — PREDNISONE 20 MG PO TABS: 40.0000 mg | ORAL_TABLET | Freq: Every day | ORAL | 0 refills | Status: AC

## 2022-04-10 MED ORDER — ALBUTEROL SULFATE HFA 108 (90 BASE) MCG/ACT IN AERS: INHALATION_SPRAY | RESPIRATORY_TRACT | 4 refills | Status: DC

## 2022-04-10 MED ORDER — IPRATROPIUM-ALBUTEROL 0.5-2.5 (3) MG/3ML IN SOLN: 3.0000 mL | Freq: Four times a day (QID) | RESPIRATORY_TRACT | 11 refills | Status: DC | PRN

## 2022-04-10 NOTE — Progress Notes (Signed)
_0  ID: Margaret Vaughn, female    DOB: 08/13/1960, 61 y.o.   MRN: [id number]  Chief Complaint  Patient presents with   Follow-up    Pt is here for follow up for copd. Pt states she has really bad thrush right now. She has used nystatin but its not helping. She is on Advair high dose and Sprivia.     Referring provider: Dettinger, Fransisca Kaufmann, MD  HPI:   61 y.o. woman whom are seen in consultation for dyspnea exertion and COPD. Most recent PCP note reviewed.  Returns for routine follow-up.  Breathing largely unchanged.  Mains on oxygen.  Cough worsened over the last few days.  Has discovered mold on her bed.  Unclear how this is happened.  She reports recurrent thrush on Wixela and would like to try different inhaler.  HPI at initial visit: Patient was diagnosed with COPD sometime ago.  PFTs performed at pulmonary doctor office in [facility].  Reportedly no medication was prescribed to her given she was still smoking despite her symptoms as well as her diagnosis of COPD on PFTs.  Seen by PCP recently.  Started on inhalers.  Referred here for further evaluation.  She is on low-dose Wixela as well as Spiriva daily.  She does think the medication helps.  Dyspnea bit better.  Her chronic issues include dyspnea on exertion.  Worse on inclines or stairs.  Gradually getting worse.  No time of day when things are better or worse.  No position that makes things better or worse.  No seasonal environmental factors to make things better or worse.  She also has a cough.  Chronic.  Daily.  For years.  Productive of sputum.  Sometimes worse in the mornings.  Again the inhaler seem to be helping some with this.  She has been on oxygen.  Using 2 L.  She has chronic neurologic issues, weakness.  Due to cervical pathology.  Difficult to manipulate small tanks.  Unable to get POC through current oxygen provider.  Walk today.  Qualify for POC.  New order placed to different DME company.  PMH: Tobacco  abuse, Hyperlipidemia, GERD, hypertension, migraines Surgical history: Hysterectomy, anterior cervical decompression, appendectomy, hernia repair, tubal ligation Family history: Mother with uterine cancer, CAD, hyperlipidemia Social history: Current smoker, 60-pack-year, trying to reduce.  Quit for 2 months 12 years ago.  Then resumed.  Lives in [place] / Pulmonary Flowsheets:   ACT:      No data to display           MMRC:     No data to display           Epworth:      No data to display           Tests:   FENO:  No results found for: "NITRICOXIDE"  PFT:     No data to display           WALK:      No data to display           Imaging: Personally reviewed, most recent chest x-ray 2021 clear on my review interpretation No results found.  Lab Results: Personally reviewed CBC    Component Value Date/Time   WBC 7.9 03/20/2022 1405   WBC 9.3 03/06/2021 1600   RBC 5.43 (H) 03/20/2022 1405   RBC 4.89 03/06/2021 1600   HGB 16.6 (H) 03/20/2022 1405   HCT 50.3 (H) 03/20/2022 1405  PLT 252 03/20/2022 1405   MCV 93 03/20/2022 1405   MCH 30.6 03/20/2022 1405   MCH 29.4 03/06/2021 1600   MCHC 33.0 03/20/2022 1405   MCHC 32.6 03/06/2021 1600   RDW 14.1 03/20/2022 1405   LYMPHSABS 2.2 03/20/2022 1405   MONOABS 1.1 (H) 08/14/2020 0639   EOSABS 0.2 03/20/2022 1405   BASOSABS 0.0 03/20/2022 1405    BMET    Component Value Date/Time   NA 140 03/20/2022 1405   K 4.0 03/20/2022 1405   CL 99 03/20/2022 1405   CO2 28 03/20/2022 1405   GLUCOSE 139 (H) 03/20/2022 1405   GLUCOSE 92 03/06/2021 1600   BUN 8 03/20/2022 1405   CREATININE 0.76 03/20/2022 1405   CALCIUM 9.5 03/20/2022 1405   GFRNONAA >60 03/06/2021 1600   GFRAA 112 03/13/2020 1523    BNP No results found for: "BNP"  ProBNP No results found for: "PROBNP"  Specialty Problems       Pulmonary Problems   COPD (chronic obstructive pulmonary  disease) (HCC)   Chronic respiratory failure with hypoxia (HCC)    Allergies  Allergen Reactions   Iohexol Anaphylaxis    PT.GIVEN OMNI 300 FOR CT ANGIO WAS GIVEN 50 MG.BENADRYL PRIOR TO SCAN HAD NO PROBLEMS    Moxifloxacin Anaphylaxis   Shellfish Allergy Hives    Immunization History  Administered Date(s) Administered   Hepatitis B 05/11/1991, 06/08/1991, 11/08/1991   Influenza,inj,Quad PF,6+ Mos 06/26/2020   MMR 08/30/1990   Tdap 08/22/2015   Zoster Recombinat (Shingrix) 12/18/2021, 03/20/2022    Past Medical History:  Diagnosis Date   Acid reflux    Allergies    Anemia    Asthma    Bronchitis    Cervical stenosis of spine    Chronic pain syndrome    COPD (chronic obstructive pulmonary disease) (HCC)    DDD (degenerative disc disease), lumbar    Depression with anxiety    Diabetes mellitus without complication ([place])    Dyspnea    Dysrhythmia    PSVT   Fatigue    H/O blood clots    H/O echocardiogram 1989   Per pt, done @ [place] mitral valve prolapse   Headache(784.0)    Heart murmur    Hemorrhoids, external    History of hiatal hernia    History of kidney stones    History of PSVT (paroxysmal supraventricular tachycardia)    HTN (hypertension)    Hyperlipidemia    Hypoxemia    Insomnia    Memory loss    Myalgia and myositis    Narcolepsy    On home O2    2-4 L @ HS   OSA (obstructive sleep apnea)    Pneumonia    Sleeping difficulty    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 01/09/22   Ready to quit: Not Answered Counseling given: Not Answered Tobacco comments: Pt states she smokes 2 ppd. 01/09/22   Continue to not smoke  Outpatient Encounter  Medications as of 04/10/2022  Medication Sig   atorvastatin (LIPITOR) 40 MG tablet Take 1 tablet (40 mg total) by mouth daily.  budesonide-formoterol (SYMBICORT) 160-4.5 MCG/ACT inhaler Inhale 2 puffs into the lungs 2 (two) times daily.   cholecalciferol (VITAMIN D3) 25 MCG (1000 UNIT) tablet Take 1,000 Units by mouth daily.   cyclobenzaprine (FLEXERIL) 10 MG tablet Take 1 tablet (10 mg total) by mouth at bedtime as needed for muscle spasms.   DULoxetine (CYMBALTA) 30 MG capsule TAKE 1 CAPSULE BY MOUTH EVERY DAY   enalapril (VASOTEC) 5 MG tablet Take 1 tablet (5 mg total) by mouth daily.   esomeprazole (NEXIUM) 40 MG capsule Take 1 capsule (40 mg total) by mouth daily.   ipratropium-albuterol (DUONEB) 0.5-2.5 (3) MG/3ML SOLN Take 3 mLs by nebulization every 6 (six) hours as needed.   lubiprostone (AMITIZA) 24 MCG capsule Take 1 capsule (24 mcg total) by mouth 2 (two) times daily with a meal.   Multiple Vitamins-Minerals (HAIR SKIN NAILS PO) Take 3 tablets by mouth daily.   Multiple Vitamins-Minerals (MULTIVITAMIN WITH MINERALS) tablet Take 1 tablet by mouth daily.   nicotine (NICODERM CQ - DOSED IN MG/24 HOURS) 21 mg/24hr patch Place 21 mg onto the skin daily as needed (smoking cessation).   nitrofurantoin (MACRODANTIN) 100 MG capsule Take 100 mg by mouth daily.   Omega-3 Fatty Acids (FISH OIL) 1000 MG CAPS Take 4,000 mg by mouth daily.   oxyCODONE (OXYCONTIN) 10 mg 12 hr tablet Take 1 tablet (10 mg total) by mouth 2 (two) times daily.   predniSONE (DELTASONE) 20 MG tablet Take 2 tablets (40 mg total) by mouth daily with breakfast for 5 days.   pregabalin (LYRICA) 150 MG capsule Take 1 capsule (150 mg total) by mouth 3 (three) times daily.   rizatriptan (MAXALT-MLT) 10 MG disintegrating tablet Take 1 tablet (10 mg total) by mouth as needed for migraine. May repeat in 2 hours if needed   sitaGLIPtin-metformin (JANUMET) 50-1000 MG tablet TAKE 1 TABLET BY MOUTH TWICE A DAY WITH A MEAL   tiotropium  (SPIRIVA HANDIHALER) 18 MCG inhalation capsule INHALE CONTENTS OF 1 CAPSULE VIA HANDIHALER ONCE DAILY AT SAME TIME EVERY DAY   XTAMPZA ER 13.5 MG C12A Take 13.5 mg by mouth 2 (two) times daily.   [DISCONTINUED] albuterol (VENTOLIN HFA) 108 (90 Base) MCG/ACT inhaler INHALE 2 PUFFS BY MOUTH EVERY 6 HOURS AS NEEDED FOR WHEEZE OR SHORTNESS OF BREATH   [DISCONTINUED] fluticasone-salmeterol (WIXELA INHUB) 500-50 MCG/ACT AEPB Inhale 1 puff into the lungs in the morning and at bedtime.   albuterol (VENTOLIN HFA) 108 (90 Base) MCG/ACT inhaler INHALE 2 PUFFS BY MOUTH EVERY 6 HOURS AS NEEDED FOR WHEEZE OR SHORTNESS OF BREATH   No facility-administered encounter medications on file as of 04/10/2022.     Review of Systems  Review of Systems  N/a Physical Exam  BP 124/68 (BP Location: Left Arm, Patient Position: Sitting, Cuff Size: Normal)   Pulse (!) 104   Wt 147 lb 12.8 oz (67 kg)   SpO2 94%   BMI 25.37 kg/m   Wt Readings from Last 5 Encounters:  04/10/22 147 lb 12.8 oz (67 kg)  03/20/22 144 lb (65.3 kg)  01/09/22 142 lb (64.4 kg)  01/08/22 147 lb 9.6 oz (67 kg)  12/18/21 146 lb (66.2 kg)    BMI Readings from Last 5 Encounters:  04/10/22 25.37 kg/m  03/20/22 24.72 kg/m  01/09/22 24.37 kg/m  01/08/22 25.34 kg/m  12/18/21 25.06 kg/m     Physical Exam General: Sitting in chair, no acute distress Eyes: EOMI, icterus Neck: Supple, no JVP Pulmonary: Distant with expiratory wheeze, clear  Cardiovascular: Warm, no edema Abdomen: Nondistended, bowel sounds present MSK: No synovitis, joint effusion Neuro: Normal gait, no weakness Psych: Normal mood, full affect   Assessment & Plan:   Dyspnea on exertion: Likely multifactorial largely given by underlying COPD due to cigarette smoking.  Likely contribution from deconditioning given decreased activity over time.  Mild improvement with low-dose ICS/LABA and Spiriva daily.  Stop wixela, substitute high dose Symbicort.    COPD with  acute exacerbation: Suspect severe based on description of her PFTs.  I cannot review these.  Symbicort as above, continue Spiriva.  Prednisone 40 mg daily for 5 days given worsening cough, wheeze on exam concerning for exacerbation.  New order for nebulizer medicine and DuoNebs today.  Polycythemia: Likely due to chronic hypoxemia.  Stressed importance of oxygen usage at all times with exertion.  Chronic hypoxemic respiratory failure: Continue oxygen supplementation POC device delivered.    Return in about 3 months (around 07/10/2022).   Lanier Clam, MD 04/10/2022

## 2022-04-10 NOTE — Patient Instructions (Addendum)
Your DME is [facility] for your oxygen needs and supplies   Nice to see you again  Stop Wixela given the thrush  Start Symbicort 2 puffs twice a day every day.  Rinse your mouth out with water after every use.  Continue Spiriva once daily  Continue use your albuterol inhaler as you are  Use DuoNebs for your nebulizer, we will send a new order for the machine.  Use up to every 6 hours as needed for shortness of breath or wheezing  Take prednisone 40 mg daily for 5 days given the wheeze and worsening cough  Stop taking melatonin  I will message my colleagues to get recommendations on medicines to try to help with the insomnia or difficulty sleeping  Return to clinic in 3 months or sooner as needed with Dr. Silas Flood

## 2022-04-15 DIAGNOSIS — J449 Chronic obstructive pulmonary disease, unspecified: Secondary | ICD-10-CM | POA: Diagnosis not present

## 2022-04-21 DIAGNOSIS — J9601 Acute respiratory failure with hypoxia: Secondary | ICD-10-CM | POA: Diagnosis not present

## 2022-04-21 DIAGNOSIS — J449 Chronic obstructive pulmonary disease, unspecified: Secondary | ICD-10-CM | POA: Diagnosis not present

## 2022-04-26 DIAGNOSIS — R0902 Hypoxemia: Secondary | ICD-10-CM | POA: Diagnosis not present

## 2022-04-29 ENCOUNTER — Telehealth: Payer: Self-pay | Admitting: *Deleted

## 2022-04-29 NOTE — Telephone Encounter (Signed)
LMOVM to call back to schedule EGD, ASA Dr. Gala Romney

## 2022-05-06 ENCOUNTER — Encounter: Payer: Self-pay | Admitting: *Deleted

## 2022-05-06 MED ORDER — PEG 3350-KCL-NA BICARB-NACL 420 G PO SOLR: 4000.0000 mL | Freq: Once | ORAL | 0 refills | Status: AC

## 2022-05-06 NOTE — Telephone Encounter (Signed)
Pt has been scheduled for 06/10/22. Instructions and pre-op mailed to pt. Prep sent to the pharmacy

## 2022-05-08 DIAGNOSIS — M4802 Spinal stenosis, cervical region: Secondary | ICD-10-CM | POA: Diagnosis not present

## 2022-05-08 DIAGNOSIS — G894 Chronic pain syndrome: Secondary | ICD-10-CM | POA: Diagnosis not present

## 2022-05-08 DIAGNOSIS — R030 Elevated blood-pressure reading, without diagnosis of hypertension: Secondary | ICD-10-CM | POA: Diagnosis not present

## 2022-05-08 DIAGNOSIS — E119 Type 2 diabetes mellitus without complications: Secondary | ICD-10-CM | POA: Diagnosis not present

## 2022-05-08 DIAGNOSIS — J449 Chronic obstructive pulmonary disease, unspecified: Secondary | ICD-10-CM | POA: Diagnosis not present

## 2022-05-08 DIAGNOSIS — E559 Vitamin D deficiency, unspecified: Secondary | ICD-10-CM | POA: Diagnosis not present

## 2022-05-08 DIAGNOSIS — F17200 Nicotine dependence, unspecified, uncomplicated: Secondary | ICD-10-CM | POA: Diagnosis not present

## 2022-05-08 DIAGNOSIS — Z6824 Body mass index (BMI) 24.0-24.9, adult: Secondary | ICD-10-CM | POA: Diagnosis not present

## 2022-05-08 DIAGNOSIS — Z79899 Other long term (current) drug therapy: Secondary | ICD-10-CM | POA: Diagnosis not present

## 2022-05-08 DIAGNOSIS — F17210 Nicotine dependence, cigarettes, uncomplicated: Secondary | ICD-10-CM | POA: Diagnosis not present

## 2022-05-08 DIAGNOSIS — G950 Syringomyelia and syringobulbia: Secondary | ICD-10-CM | POA: Diagnosis not present

## 2022-05-21 ENCOUNTER — Telehealth: Payer: Self-pay | Admitting: *Deleted

## 2022-05-21 DIAGNOSIS — J449 Chronic obstructive pulmonary disease, unspecified: Secondary | ICD-10-CM | POA: Diagnosis not present

## 2022-05-21 DIAGNOSIS — J9601 Acute respiratory failure with hypoxia: Secondary | ICD-10-CM | POA: Diagnosis not present

## 2022-05-21 NOTE — Telephone Encounter (Signed)
Called spoke with pt to reschedule procedure for 11/15. Will await Dr. Roseanne Kaufman December schedule and will call once we have this.

## 2022-05-25 DIAGNOSIS — N3001 Acute cystitis with hematuria: Secondary | ICD-10-CM | POA: Diagnosis not present

## 2022-05-25 DIAGNOSIS — R55 Syncope and collapse: Secondary | ICD-10-CM | POA: Diagnosis not present

## 2022-05-25 DIAGNOSIS — R531 Weakness: Secondary | ICD-10-CM | POA: Diagnosis not present

## 2022-05-25 DIAGNOSIS — R5383 Other fatigue: Secondary | ICD-10-CM | POA: Diagnosis not present

## 2022-05-25 DIAGNOSIS — R34 Anuria and oliguria: Secondary | ICD-10-CM | POA: Diagnosis not present

## 2022-05-27 DIAGNOSIS — R0902 Hypoxemia: Secondary | ICD-10-CM | POA: Diagnosis not present

## 2022-05-29 NOTE — Telephone Encounter (Signed)
Called pt and scheduled for 12/6 at 9am. Already has prep at home. Will mail new instructions/pre-op appt.

## 2022-06-01 ENCOUNTER — Encounter (INDEPENDENT_AMBULATORY_CARE_PROVIDER_SITE_OTHER): Payer: Self-pay | Admitting: *Deleted

## 2022-06-05 ENCOUNTER — Other Ambulatory Visit (HOSPITAL_COMMUNITY): Payer: Medicaid Other

## 2022-06-05 ENCOUNTER — Telehealth: Payer: Self-pay | Admitting: Pulmonary Disease

## 2022-06-05 DIAGNOSIS — J441 Chronic obstructive pulmonary disease with (acute) exacerbation: Secondary | ICD-10-CM

## 2022-06-05 NOTE — Telephone Encounter (Signed)
Called patients husband back and confirmed that when he and his wife were in office last time they wanted the order to be sent to [facility]. Now husband is stating he wants order sent to [place]. I told husband I will send the order for POC to [facility] now. Nothing further needed

## 2022-06-05 NOTE — Addendum Note (Signed)
Addended by: Monna Fam L on: 06/05/2022 05:03 PM   Modules accepted: Orders

## 2022-06-05 NOTE — Telephone Encounter (Signed)
Called patient to update her that since it has been more than 30 days she is going to have to come back in for a new walk test with the POC. She does have office visit with [facility] on 11/27. And I told her if she would like she can be seen sooner. She and husband did not want to schedule office visit at this time given they were out. I advised them to call office back next week to see if we can get her seen sooner for walk test if she would like, and if not I will walk her at her next office visit. Nothing further needed at this time   Canceled DME order that I placed today!

## 2022-06-22 ENCOUNTER — Other Ambulatory Visit: Payer: Self-pay | Admitting: Pulmonary Disease

## 2022-06-22 ENCOUNTER — Ambulatory Visit (INDEPENDENT_AMBULATORY_CARE_PROVIDER_SITE_OTHER): Payer: Medicaid Other | Admitting: Pulmonary Disease

## 2022-06-22 ENCOUNTER — Encounter: Payer: Self-pay | Admitting: Pulmonary Disease

## 2022-06-22 VITALS — BP 124/68 | HR 95 | Wt 138.2 lb

## 2022-06-22 DIAGNOSIS — J9611 Chronic respiratory failure with hypoxia: Secondary | ICD-10-CM | POA: Diagnosis not present

## 2022-06-22 MED ORDER — SPIRIVA RESPIMAT 2.5 MCG/ACT IN AERS: 2.0000 | INHALATION_SPRAY | Freq: Every day | RESPIRATORY_TRACT | 11 refills | Status: DC

## 2022-06-22 MED ORDER — RAMELTEON 8 MG PO TABS: 8.0000 mg | ORAL_TABLET | Freq: Every day | ORAL | 3 refills | Status: DC

## 2022-06-22 NOTE — Progress Notes (Signed)
_0  ID: [name], female    DOB: 1961/03/29, 61 y.o.   MRN: [id number]  Chief Complaint  Patient presents with   Follow-up    Follow up for COPD. Pt is taking Symbicort daily. And Albuterol as needed. Pt states she has not done any of the Duonebs yet.     Referring provider: Dettinger, Fransisca Kaufmann, MD  HPI:   61 y.o. woman whom are seen in follow up for dyspnea exertion and COPD.   Returns for routine follow-up.  At last visit, switch Wixela to Symbicort given recurrent thrush but also an effort to see if HFA would be better than DPI.  She does think this helps on both and her symptoms of thrush certainly but also in her day-to-day symptoms of shortness of breath.  Remains short of breath.  No longer using Spiriva.  She was using the HandiHaler.  Discussed role and rationale for additional bronchodilation in the current condition.  But using Respimat.  She expressed understanding.she is   She continues to have issues with sleeping.  Primarily falling asleep but sometimes sleep maintenance.  HPI at initial visit: Patient was diagnosed with COPD sometime ago.  PFTs performed at pulmonary doctor office in [facility].  Reportedly no medication was prescribed to her given she was still smoking despite her symptoms as well as her diagnosis of COPD on PFTs.  Seen by PCP recently.  Started on inhalers.  Referred here for further evaluation.  She is on low-dose Wixela as well as Spiriva daily.  She does think the medication helps.  Dyspnea bit better.  Her chronic issues include dyspnea on exertion.  Worse on inclines or stairs.  Gradually getting worse.  No time of day when things are better or worse.  No position that makes things better or worse.  No seasonal environmental factors to make things better or worse.  She also has a cough.  Chronic.  Daily.  For years.  Productive of sputum.  Sometimes worse in the mornings.  Again the inhaler seem to be helping some with this.  She has  been on oxygen.  Using 2 L.  She has chronic neurologic issues, weakness.  Due to cervical pathology.  Difficult to manipulate small tanks.  Unable to get POC through current oxygen provider.  Walk today.  Qualify for POC.  New order placed to different DME company.  PMH: Tobacco abuse, Hyperlipidemia, GERD, hypertension, migraines Surgical history: Hysterectomy, anterior cervical decompression, appendectomy, hernia repair, tubal ligation Family history: Mother with uterine cancer, CAD, hyperlipidemia Social history: Current smoker, 60-pack-year, trying to reduce.  Quit for 2 months 12 years ago.  Then resumed.  Lives in [place] / Pulmonary Flowsheets:   ACT:      No data to display           MMRC:     No data to display           Epworth:      No data to display           Tests:   FENO:  No results found for: "NITRICOXIDE"  PFT:     No data to display           WALK:      No data to display           Imaging: Personally reviewed, most recent chest x-ray 2021 clear on my review interpretation No results found.  Lab Results: Personally  reviewed CBC    Component Value Date/Time   WBC 7.9 03/20/2022 1405   WBC 9.3 03/06/2021 1600   RBC 5.43 (H) 03/20/2022 1405   RBC 4.89 03/06/2021 1600   HGB 16.6 (H) 03/20/2022 1405   HCT 50.3 (H) 03/20/2022 1405   PLT 252 03/20/2022 1405   MCV 93 03/20/2022 1405   MCH 30.6 03/20/2022 1405   MCH 29.4 03/06/2021 1600   MCHC 33.0 03/20/2022 1405   MCHC 32.6 03/06/2021 1600   RDW 14.1 03/20/2022 1405   LYMPHSABS 2.2 03/20/2022 1405   MONOABS 1.1 (H) 08/14/2020 0639   EOSABS 0.2 03/20/2022 1405   BASOSABS 0.0 03/20/2022 1405    BMET    Component Value Date/Time   NA 140 03/20/2022 1405   K 4.0 03/20/2022 1405   CL 99 03/20/2022 1405   CO2 28 03/20/2022 1405   GLUCOSE 139 (H) 03/20/2022 1405   GLUCOSE 92 03/06/2021 1600   BUN 8 03/20/2022 1405   CREATININE 0.76  03/20/2022 1405   CALCIUM 9.5 03/20/2022 1405   GFRNONAA >60 03/06/2021 1600   GFRAA 112 03/13/2020 1523    BNP No results found for: "BNP"  ProBNP No results found for: "PROBNP"  Specialty Problems       Pulmonary Problems   COPD (chronic obstructive pulmonary disease) (HCC)   Chronic respiratory failure with hypoxia (HCC)    Allergies  Allergen Reactions   Iohexol Anaphylaxis    PT.GIVEN OMNI 300 FOR CT ANGIO WAS GIVEN 50 MG.BENADRYL PRIOR TO SCAN HAD NO PROBLEMS    Moxifloxacin Anaphylaxis   Shellfish Allergy Hives    Immunization History  Administered Date(s) Administered   Hepatitis B 05/11/1991, 06/08/1991, 11/08/1991   Influenza,inj,Quad PF,6+ Mos 06/26/2020   MMR 08/30/1990   PFIZER(Purple Top)SARS-COV-2 Vaccination 11/08/2019   Tdap 08/22/2015   Zoster Recombinat (Shingrix) 12/18/2021, 03/20/2022    Past Medical History:  Diagnosis Date   Acid reflux    Allergies    Anemia    Asthma    Bronchitis    Cervical stenosis of spine    Chronic pain syndrome    COPD (chronic obstructive pulmonary disease) ([place])    DDD (degenerative disc disease), lumbar    Depression with anxiety    Diabetes mellitus without complication ([place])    Dyspnea    Dysrhythmia    PSVT   Fatigue    H/O blood clots    H/O echocardiogram 1989   Per pt, done @ [place] mitral valve prolapse   Headache(784.0)    Heart murmur    Hemorrhoids, external    History of hiatal hernia    History of kidney stones    History of PSVT (paroxysmal supraventricular tachycardia)    HTN (hypertension)    Hyperlipidemia    Hypoxemia    Insomnia    Memory loss    Myalgia and myositis    Narcolepsy    On home O2    2-4 L @ HS   OSA (obstructive sleep apnea)    Pneumonia    Sleeping difficulty    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HCC)    Thoracalgia    Tobacco abuse    Tremor    Ulcer disease     Tobacco History: Social History   Tobacco Use  Smoking Status Every Day    Packs/day: 2.00   Years: 30.00   Total pack years: 60.00   Types: Cigarettes  Smokeless Tobacco Never  Tobacco Comments   Pt states she is down  to 1 ppd. 06/22/2022    Ready to quit: Not Answered Counseling given: Not Answered Tobacco comments: Pt states she is down to 1 ppd. 06/22/2022    Continue to not smoke  Outpatient Encounter Medications as of 06/22/2022  Medication Sig   albuterol (VENTOLIN HFA) 108 (90 Base) MCG/ACT inhaler INHALE 2 PUFFS BY MOUTH EVERY 6 HOURS AS NEEDED FOR WHEEZE OR SHORTNESS OF BREATH   atorvastatin (LIPITOR) 40 MG tablet Take 1 tablet (40 mg total) by mouth daily.   budesonide-formoterol (SYMBICORT) 160-4.5 MCG/ACT inhaler Inhale 2 puffs into the lungs 2 (two) times daily.   cholecalciferol (VITAMIN D3) 25 MCG (1000 UNIT) tablet Take 1,000 Units by mouth daily.   cyclobenzaprine (FLEXERIL) 10 MG tablet Take 1 tablet (10 mg total) by mouth at bedtime as needed for muscle spasms.   DULoxetine (CYMBALTA) 30 MG capsule TAKE 1 CAPSULE BY MOUTH EVERY DAY   enalapril (VASOTEC) 5 MG tablet Take 1 tablet (5 mg total) by mouth daily.   esomeprazole (NEXIUM) 40 MG capsule Take 1 capsule (40 mg total) by mouth daily.   ipratropium-albuterol (DUONEB) 0.5-2.5 (3) MG/3ML SOLN Take 3 mLs by nebulization every 6 (six) hours as needed.   lubiprostone (AMITIZA) 24 MCG capsule Take 1 capsule (24 mcg total) by mouth 2 (two) times daily with a meal.   Multiple Vitamins-Minerals (HAIR SKIN NAILS PO) Take 3 tablets by mouth daily.   Multiple Vitamins-Minerals (MULTIVITAMIN WITH MINERALS) tablet Take 1 tablet by mouth daily.   nicotine (NICODERM CQ - DOSED IN MG/24 HOURS) 21 mg/24hr patch Place 21 mg onto the skin daily as needed (smoking cessation).   nitrofurantoin (MACRODANTIN) 100 MG capsule Take 100 mg by mouth daily.   Omega-3 Fatty Acids (FISH OIL) 1000 MG CAPS Take 4,000 mg by mouth daily.   oxyCODONE (OXYCONTIN) 10 mg 12 hr tablet Take 1 tablet (10 mg total) by mouth 2  (two) times daily.   pregabalin (LYRICA) 150 MG capsule Take 1 capsule (150 mg total) by mouth 3 (three) times daily.   ramelteon (ROZEREM) 8 MG tablet Take 1 tablet (8 mg total) by mouth at bedtime.   sitaGLIPtin-metformin (JANUMET) 50-1000 MG tablet TAKE 1 TABLET BY MOUTH TWICE A DAY WITH A MEAL   Tiotropium Bromide Monohydrate (SPIRIVA RESPIMAT) 2.5 MCG/ACT AERS Inhale 2 puffs into the lungs daily.   XTAMPZA ER 13.5 MG C12A Take 13.5 mg by mouth 2 (two) times daily.   [DISCONTINUED] rizatriptan (MAXALT-MLT) 10 MG disintegrating tablet Take 1 tablet (10 mg total) by mouth as needed for migraine. May repeat in 2 hours if needed   [DISCONTINUED] tiotropium (SPIRIVA HANDIHALER) 18 MCG inhalation capsule INHALE CONTENTS OF 1 CAPSULE VIA HANDIHALER ONCE DAILY AT SAME TIME EVERY DAY   No facility-administered encounter medications on file as of 06/22/2022.     Review of Systems  Review of Systems  N/a Physical Exam  BP 124/68 (BP Location: Left Arm, Patient Position: Sitting, Cuff Size: Normal)   Pulse 95   Wt 138 lb 3.2 oz (62.7 kg)   SpO2 95%   BMI 23.72 kg/m   Wt Readings from Last 5 Encounters:  06/22/22 138 lb 3.2 oz (62.7 kg)  04/10/22 147 lb 12.8 oz (67 kg)  03/20/22 144 lb (65.3 kg)  01/09/22 142 lb (64.4 kg)  01/08/22 147 lb 9.6 oz (67 kg)    BMI Readings from Last 5 Encounters:  06/22/22 23.72 kg/m  04/10/22 25.37 kg/m  03/20/22 24.72 kg/m  01/09/22 24.37 kg/m  01/08/22 25.34 kg/m     Physical Exam General: Sitting in chair, no acute distress Eyes: EOMI, icterus Neck: Supple, no JVP Pulmonary: Distant with expiratory wheeze, clear Cardiovascular: Warm, no edema Abdomen: Nondistended, bowel sounds present MSK: No synovitis, joint effusion Neuro: Normal gait, no weakness Psych: Normal mood, full affect   Assessment & Plan:   Dyspnea on exertion: Likely multifactorial largely given by underlying COPD due to cigarette smoking.  Likely contribution from  deconditioning given decreased activity over time.  Improved with alteration of COPD therapies.  COPD: Suspect severe based on description of her PFTs.  I cannot review these.  Improvement overall with switch from DPI to HFA ICS/LABA, continue high-dose Symbicort.  Add high-dose Symbicort Respimat 2 puff daily.  Polycythemia: Likely due to chronic hypoxemia.  Stressed importance of oxygen usage at all times with exertion.  Chronic hypoxemic respiratory failure: Continue oxygen supplementation POC device requalification today.  Insomnia: Discussed case with Dr. Gala Murdoch.  Recommended ramelteon.  This was prescribed today.  Severe sleep apnea/chronic hypercapnia: On BiPAP therapy.  BiPAP machine not functioning well.  Will need a repeat in lab study but will schedule when sleeping better.  Assess response to ramelteon.  Will order in lab sleep study once sleeping better.  Continue BiPAP machine for now  Return in about 3 months (around 09/22/2022).   Lanier Clam, MD 06/22/2022  I spent 45 minutes in care of patient including review of records, face-to-face visit, coordination of care.

## 2022-06-22 NOTE — Patient Instructions (Signed)
I am glad Symbicort helped some  Let's add back Spiriva - this time the Respimat device - 2 puff once a day  For sleep, take ramelteon 1 tablet at night.  If you find associated better let me know we will need to order an in lab sleep study for the BiPAP.  Return to clinic in 3 months or sooner as needed with Dr. Silas Flood

## 2022-06-23 ENCOUNTER — Other Ambulatory Visit: Payer: Self-pay

## 2022-06-23 MED ORDER — RAMELTEON 8 MG PO TABS: 8.0000 mg | ORAL_TABLET | Freq: Every day | ORAL | 3 refills | Status: DC

## 2022-06-23 NOTE — Telephone Encounter (Signed)
Spoke to pt's husband and reordered Rozerem and sent to the [facility] in [place]. Pt's husband verbalized understanding nothing further needed.

## 2022-06-26 ENCOUNTER — Ambulatory Visit: Payer: Medicaid Other | Admitting: Family Medicine

## 2022-06-26 ENCOUNTER — Encounter: Payer: Self-pay | Admitting: Family Medicine

## 2022-06-26 VITALS — BP 119/86 | HR 101 | Temp 97.7°F | Ht 64.0 in | Wt 137.0 lb

## 2022-06-26 DIAGNOSIS — R0902 Hypoxemia: Secondary | ICD-10-CM | POA: Diagnosis not present

## 2022-06-26 DIAGNOSIS — M797 Fibromyalgia: Secondary | ICD-10-CM | POA: Diagnosis not present

## 2022-06-26 DIAGNOSIS — J9611 Chronic respiratory failure with hypoxia: Secondary | ICD-10-CM

## 2022-06-26 DIAGNOSIS — E1169 Type 2 diabetes mellitus with other specified complication: Secondary | ICD-10-CM

## 2022-06-26 DIAGNOSIS — Z23 Encounter for immunization: Secondary | ICD-10-CM | POA: Diagnosis not present

## 2022-06-26 DIAGNOSIS — E782 Mixed hyperlipidemia: Secondary | ICD-10-CM | POA: Diagnosis not present

## 2022-06-26 DIAGNOSIS — I10 Essential (primary) hypertension: Secondary | ICD-10-CM | POA: Diagnosis not present

## 2022-06-26 DIAGNOSIS — E1149 Type 2 diabetes mellitus with other diabetic neurological complication: Secondary | ICD-10-CM | POA: Diagnosis not present

## 2022-06-26 DIAGNOSIS — E1142 Type 2 diabetes mellitus with diabetic polyneuropathy: Secondary | ICD-10-CM | POA: Diagnosis not present

## 2022-06-26 DIAGNOSIS — G950 Syringomyelia and syringobulbia: Secondary | ICD-10-CM

## 2022-06-26 DIAGNOSIS — F411 Generalized anxiety disorder: Secondary | ICD-10-CM | POA: Diagnosis not present

## 2022-06-26 LAB — BAYER DCA HB A1C WAIVED: HB A1C (BAYER DCA - WAIVED): 5.7 % — ABNORMAL HIGH (ref 4.8–5.6)

## 2022-06-26 MED ORDER — DULOXETINE HCL 60 MG PO CPEP: 60.0000 mg | ORAL_CAPSULE | Freq: Every day | ORAL | 3 refills | Status: DC

## 2022-06-26 NOTE — Progress Notes (Signed)
BP 119/86   Pulse (!) 101   Temp 97.7 F (36.5 C)   Ht '5\' 4"'$  (1.626 m)   Wt 137 lb (62.1 kg)   SpO2 94%   BMI 23.52 kg/m    Subjective:   Patient ID: Margaret Vaughn, female    DOB: Feb 14, 1961, 61 y.o.   MRN: [id number]  HPI: Margaret Vaughn is a 61 y.o. female presenting on 06/26/2022 for Medical Management of Chronic Issues and Diabetes   HPI Type 2 diabetes mellitus Patient comes in today for recheck of his diabetes. Patient has been currently taking Janumet. Patient is currently on an ACE inhibitor/ARB. Patient has seen an ophthalmologist this year. Patient denies any issues with their feet. The symptom started onset as an adult hypertension and hyperlipidemia ARE RELATED TO DM   Hypertension Patient is currently on enalapril, and their blood pressure today is 119/86. Patient denies any lightheadedness or dizziness. Patient denies headaches, blurred vision, chest pains, shortness of breath, or weakness. Denies any side effects from medication and is content with current medication.   Hyperlipidemia Patient is coming in for recheck of his hyperlipidemia. The patient is currently taking atorvastatin. They deny any issues with myalgias or history of liver damage from it. They deny any focal numbness or weakness or chest pain.   Fibromyalgia and anxiety recheck Patient is coming in for fibromyalgia and anxiety recheck.  She does have a pain management specialist but she does suggest for the Cymbalta.  She says her anxiety has been building up more recently especially since she has had some trouble with her Social Security check.  COPD Patient is coming in for COPD recheck today.  She has inhalers but needs oxygen.  She is on Symbicort and Spiriva but she does not use this.  All the time.  She is currently using 2 L nasal cannula.  Relevant past medical, surgical, family and social history reviewed and updated as indicated. Interim medical history since our last visit  reviewed. Allergies and medications reviewed and updated.  Review of Systems  Constitutional:  Negative for chills and fever.  Eyes:  Negative for visual disturbance.  Respiratory:  Positive for cough, shortness of breath and wheezing. Negative for chest tightness.   Cardiovascular:  Negative for chest pain and leg swelling.  Musculoskeletal:  Negative for back pain and gait problem.  Skin:  Negative for rash.  Neurological:  Negative for light-headedness and headaches.  Psychiatric/Behavioral:  Negative for agitation and behavioral problems.   All other systems reviewed and are negative.   Per HPI unless specifically indicated above   Allergies as of 06/26/2022       Reactions   Iohexol Anaphylaxis   PT.GIVEN OMNI 300 FOR CT ANGIO WAS GIVEN 50 MG.BENADRYL PRIOR TO SCAN HAD NO PROBLEMS   Moxifloxacin Anaphylaxis   Shellfish Allergy Hives        Medication List        Accurate as of June 26, 2022  3:09 PM. If you have any questions, ask your nurse or doctor.          albuterol 108 (90 Base) MCG/ACT inhaler Commonly known as: Ventolin HFA INHALE 2 PUFFS BY MOUTH EVERY 6 HOURS AS NEEDED FOR WHEEZE OR SHORTNESS OF BREATH   aspirin EC 81 MG tablet Take 81 mg by mouth daily. Swallow whole.   atorvastatin 40 MG tablet Commonly known as: LIPITOR Take 1 tablet (40 mg total) by mouth daily.   B-12 PO Take 1  tablet by mouth daily.   baclofen 10 MG tablet Commonly known as: LIORESAL Take 10 mg by mouth 3 (three) times daily as needed for muscle spasms.   budesonide-formoterol 160-4.5 MCG/ACT inhaler Commonly known as: Symbicort Inhale 2 puffs into the lungs 2 (two) times daily.   cholecalciferol 25 MCG (1000 UNIT) tablet Commonly known as: VITAMIN D3 Take 1,000 Units by mouth daily.   cyclobenzaprine 10 MG tablet Commonly known as: FLEXERIL Take 1 tablet (10 mg total) by mouth at bedtime as needed for muscle spasms.   DULoxetine 60 MG capsule Commonly known  as: CYMBALTA Take 1 capsule (60 mg total) by mouth daily. TAKE 1 CAPSULE BY MOUTH EVERY DAY What changed:  medication strength how much to take how to take this when to take this Changed by: Fransisca Kaufmann Lenzie Sandler, MD   enalapril 5 MG tablet Commonly known as: VASOTEC Take 1 tablet (5 mg total) by mouth daily.   esomeprazole 40 MG capsule Commonly known as: NEXIUM Take 1 capsule (40 mg total) by mouth daily.   HAIR SKIN NAILS PO Take 3 tablets by mouth daily.   multivitamin with minerals tablet Take 1 tablet by mouth daily.   ipratropium-albuterol 0.5-2.5 (3) MG/3ML Soln Commonly known as: DUONEB Take 3 mLs by nebulization every 6 (six) hours as needed.   Janumet 50-1000 MG tablet Generic drug: sitaGLIPtin-metformin TAKE 1 TABLET BY MOUTH TWICE A DAY WITH A MEAL   lubiprostone 24 MCG capsule Commonly known as: AMITIZA Take 1 capsule (24 mcg total) by mouth 2 (two) times daily with a meal. What changed:  when to take this reasons to take this   nicotine 21 mg/24hr patch Commonly known as: NICODERM CQ - dosed in mg/24 hours Place 21 mg onto the skin daily as needed (smoking cessation).   nitrofurantoin 100 MG capsule Commonly known as: MACRODANTIN Take 100 mg by mouth daily.   oxyCODONE 10 mg 12 hr tablet Commonly known as: OXYCONTIN Take 1 tablet (10 mg total) by mouth 2 (two) times daily. What changed: Another medication with the same name was removed. Continue taking this medication, and follow the directions you see here. Changed by: Fransisca Kaufmann Jayde Daffin, MD   pregabalin 150 MG capsule Commonly known as: LYRICA Take 1 capsule (150 mg total) by mouth 3 (three) times daily.   ramelteon 8 MG tablet Commonly known as: ROZEREM Take 1 tablet (8 mg total) by mouth at bedtime.   Spiriva Respimat 2.5 MCG/ACT Aers Generic drug: Tiotropium Bromide Monohydrate Inhale 2 puffs into the lungs daily.   Xtampza ER 13.5 MG C12a Generic drug: oxyCODONE ER Take 13.5 mg by  mouth 2 (two) times daily.               Durable Medical Equipment  (From admission, onward)           Start     Ordered   06/26/22 0000  For home use only DME oxygen       Comments: Ordered on: 12/18/2021 Associated Dx: COPD exacerbation ([place]); Chronic obstructive pulmonary disease, unspecified COPD type ([place]); Chronic respiratory failure with hypoxia (HCC) 2 L nasal cannula, needs portable concentrator Authorizing provider: Kenli Waldo, Fransisca Kaufmann, MD  Question Answer Comment  Length of Need Lifetime   Mode or (Route) Nasal cannula   Liters per Minute 2   Frequency Continuous (stationary and portable oxygen unit needed)   Oxygen conserving device Yes   Oxygen delivery system Gas      06/26/22 1507  Objective:   BP 119/86   Pulse (!) 101   Temp 97.7 F (36.5 C)   Ht '5\' 4"'$  (1.626 m)   Wt 137 lb (62.1 kg)   SpO2 94%   BMI 23.52 kg/m   Wt Readings from Last 3 Encounters:  06/26/22 137 lb (62.1 kg)  06/22/22 138 lb 3.2 oz (62.7 kg)  04/10/22 147 lb 12.8 oz (67 kg)    Physical Exam Vitals and nursing note reviewed.  Constitutional:      General: She is not in acute distress.    Appearance: She is well-developed. She is not diaphoretic.     Comments: On 2 L nasal cannula  Eyes:     Conjunctiva/sclera: Conjunctivae normal.  Cardiovascular:     Rate and Rhythm: Normal rate and regular rhythm.     Heart sounds: Normal heart sounds. No murmur heard. Pulmonary:     Effort: Pulmonary effort is normal. No respiratory distress.     Breath sounds: Wheezing present. No rales.  Musculoskeletal:        General: No swelling.  Skin:    General: Skin is warm and dry.     Findings: No rash.  Neurological:     Mental Status: She is alert and oriented to person, place, and time.     Coordination: Coordination normal.  Psychiatric:        Behavior: Behavior normal.       Assessment & Plan:   Problem List Items Addressed This Visit        Cardiovascular and Mediastinum   Essential hypertension, benign     Respiratory   Chronic respiratory failure with hypoxia ([place])   Relevant Orders   For home use only DME oxygen     Endocrine   Mixed diabetic hyperlipidemia associated with type 2 diabetes mellitus ([place])   Type 2 diabetes mellitus with diabetic polyneuropathy, without long-term current use of insulin (HCC)   Relevant Medications   DULoxetine (CYMBALTA) 60 MG capsule     Nervous and Auditory   Syringomyelia and syringobulbia (HCC)   Relevant Medications   DULoxetine (CYMBALTA) 60 MG capsule     Other   Hyperlipidemia   Fibromyalgia   Relevant Medications   DULoxetine (CYMBALTA) 60 MG capsule   GAD (generalized anxiety disorder)   Relevant Medications   DULoxetine (CYMBALTA) 60 MG capsule   Other Visit Diagnoses     Type 2 diabetes mellitus with neurological complications (HCC)    -  Primary   Relevant Orders   Bayer DCA Hb A1c Waived     A1c 5.6 looks good.  Will try for portable oxygen concentrator again.  Continue other medicine currently.  No changes.  She is having more anxiety So we will increase her Cymbalta other than that no change  Follow up plan: Return in about 3 months (around 09/25/2022), or if symptoms worsen or fail to improve, for Diabetes and neuropathy recheck.  Counseling provided for all of the vaccine components Orders Placed This Encounter  Procedures   For home use only DME oxygen   Bayer DCA Hb A1c Waived    Caryl Pina, MD [facility] 06/26/2022, 3:09 PM

## 2022-06-26 NOTE — Patient Instructions (Signed)
Margaret Vaughn  06/26/2022     '@PREFPERIOPPHARMACY'$ @   Your procedure is scheduled on  07/01/2022.   Report to [facility] at  0700 A.M.   Call this number if you have problems the morning of surgery:  [phone]  If you experience any cold or flu symptoms such as cough, fever, chills, shortness of breath, etc. between now and your scheduled surgery, please notify us at the above number.   Remember:  Follow the diet and prep instructions given to you by the office.      Use your nebulizer and your inhalers before you copme and bring your rescue inhaler with you.      DO NOT take any medications for diabetes the morning of your procedure.     Take these medicines the morning of surgery with A SIP OF WATER           baclofen, duloxetine, esomeprazole, oxycodone(if needed), pregabalin, Xtampza(if needed).     Do not wear jewelry, make-up or nail polish.  Do not wear lotions, powders, or perfumes, or deodorant.  Do not shave 48 hours prior to surgery.  Men may shave face and neck.  Do not bring valuables to the hospital.  [facility] is not responsible for any belongings or valuables.  Contacts, dentures or bridgework may not be worn into surgery.  Leave your suitcase in the car.  After surgery it may be brought to your room.  For patients admitted to the hospital, discharge time will be determined by your treatment team.  Patients discharged the day of surgery will not be allowed to drive home and must have someone with them for 24 hours.    Special instructions:   DO NOT smoke tobacco or vape for 24 hours before your procedure.  Please read over the following fact sheets that you were given. Anesthesia Post-op Instructions and Care and Recovery After Surgery      Upper Endoscopy, Adult, Care After After the procedure, it is common to have a sore throat. It is also common to have: Mild stomach pain or discomfort. Bloating. Nausea. Follow these  instructions at home: The instructions below may help you care for yourself at home. Your health care provider may give you more instructions. If you have questions, ask your health care provider. If you were given a sedative during the procedure, it can affect you for several hours. Do not drive or operate machinery until your health care provider says that it is safe. If you will be going home right after the procedure, plan to have a responsible adult: Take you home from the hospital or clinic. You will not be allowed to drive. Care for you for the time you are told. Follow instructions from your health care provider about what you may eat and drink. Return to your normal activities as told by your health care provider. Ask your health care provider what activities are safe for you. Take over-the-counter and prescription medicines only as told by your health care provider. Contact a health care provider if you: Have a sore throat that lasts longer than one day. Have trouble swallowing. Have a fever. Get help right away if you: Vomit blood or your vomit looks like coffee grounds. Have bloody, black, or tarry stools. Have a very bad sore throat or you cannot swallow. Have difficulty breathing or very bad pain in your chest or abdomen. These symptoms may be an emergency. Get help right  away. Call 911. Do not wait to see if the symptoms will go away. Do not drive yourself to the hospital. Summary After the procedure, it is common to have a sore throat, mild stomach discomfort, bloating, and nausea. If you were given a sedative during the procedure, it can affect you for several hours. Do not drive until your health care provider says that it is safe. Follow instructions from your health care provider about what you may eat and drink. Return to your normal activities as told by your health care provider. This information is not intended to replace advice given to you by your health care  provider. Make sure you discuss any questions you have with your health care provider. Document Revised: 10/22/2021 Document Reviewed: 10/22/2021 Elsevier Patient Education  [place]. Colonoscopy, Adult, Care After The following information offers guidance on how to care for yourself after your procedure. Your health care provider may also give you more specific instructions. If you have problems or questions, contact your health care provider. What can I expect after the procedure? After the procedure, it is common to have: A small amount of blood in your stool for 24 hours after the procedure. Some gas. Mild cramping or bloating of your abdomen. Follow these instructions at home: Eating and drinking  Drink enough fluid to keep your urine pale yellow. Follow instructions from your health care provider about eating or drinking restrictions. Resume your normal diet as told by your health care provider. Avoid heavy or fried foods that are hard to digest. Activity Rest as told by your health care provider. Avoid sitting for a long time without moving. Get up to take short walks every 1-2 hours. This is important to improve blood flow and breathing. Ask for help if you feel weak or unsteady. Return to your normal activities as told by your health care provider. Ask your health care provider what activities are safe for you. Managing cramping and bloating  Try walking around when you have cramps or feel bloated. If directed, apply heat to your abdomen as told by your health care provider. Use the heat source that your health care provider recommends, such as a moist heat pack or a heating pad. Place a towel between your skin and the heat source. Leave the heat on for 20-30 minutes. Remove the heat if your skin turns bright red. This is especially important if you are unable to feel pain, heat, or cold. You have a greater risk of getting burned. General instructions If you were given  a sedative during the procedure, it can affect you for several hours. Do not drive or operate machinery until your health care provider says that it is safe. For the first 24 hours after the procedure: Do not sign important documents. Do not drink alcohol. Do your regular daily activities at a slower pace than normal. Eat soft foods that are easy to digest. Take over-the-counter and prescription medicines only as told by your health care provider. Keep all follow-up visits. This is important. Contact a health care provider if: You have blood in your stool 2-3 days after the procedure. Get help right away if: You have more than a small spotting of blood in your stool. You have large blood clots in your stool. You have swelling of your abdomen. You have nausea or vomiting. You have a fever. You have increasing pain in your abdomen that is not relieved with medicine. These symptoms may be an emergency. Get help right  away. Call 911. Do not wait to see if the symptoms will go away. Do not drive yourself to the hospital. Summary After the procedure, it is common to have a small amount of blood in your stool. You may also have mild cramping and bloating of your abdomen. If you were given a sedative during the procedure, it can affect you for several hours. Do not drive or operate machinery until your health care provider says that it is safe. Get help right away if you have a lot of blood in your stool, nausea or vomiting, a fever, or increased pain in your abdomen. This information is not intended to replace advice given to you by your health care provider. Make sure you discuss any questions you have with your health care provider. Document Revised: 03/05/2021 Document Reviewed: 03/05/2021 Elsevier Patient Education  [place] After The following information offers guidance on how to care for yourself after your procedure. Your health care provider  may also give you more specific instructions. If you have problems or questions, contact your health care provider. What can I expect after the procedure? After the procedure, it is common to have: Tiredness. Little or no memory about what happened during or after the procedure. Impaired judgment when it comes to making decisions. Nausea or vomiting. Some trouble with balance. Follow these instructions at home: For the time period you were told by your health care provider:  Rest. Do not participate in activities where you could fall or become injured. Do not drive or use machinery. Do not drink alcohol. Do not take sleeping pills or medicines that cause drowsiness. Do not make important decisions or sign legal documents. Do not take care of children on your own. Medicines Take over-the-counter and prescription medicines only as told by your health care provider. If you were prescribed antibiotics, take them as told by your health care provider. Do not stop using the antibiotic even if you start to feel better. Eating and drinking Follow instructions from your health care provider about what you may eat and drink. Drink enough fluid to keep your urine pale yellow. If you vomit: Drink clear fluids slowly and in small amounts as you are able. Clear fluids include water, ice chips, low-calorie sports drinks, and fruit juice that has water added to it (diluted fruit juice). Eat light and bland foods in small amounts as you are able. These foods include bananas, applesauce, rice, lean meats, toast, and crackers. General instructions  Have a responsible adult stay with you for the time you are told. It is important to have someone help care for you until you are awake and alert. If you have sleep apnea, surgery and some medicines can increase your risk for breathing problems. Follow instructions from your health care provider about wearing your sleep device: When you are sleeping. This  includes during daytime naps. While taking prescription pain medicines, sleeping medicines, or medicines that make you drowsy. Do not use any products that contain nicotine or tobacco. These products include cigarettes, chewing tobacco, and vaping devices, such as e-cigarettes. If you need help quitting, ask your health care provider. Contact a health care provider if: You feel nauseous or vomit every time you eat or drink. You feel light-headed. You are still sleepy or having trouble with balance after 24 hours. You get a rash. You have a fever. You have redness or swelling around the IV site. Get help right away if: You have trouble  breathing. You have new confusion after you get home. These symptoms may be an emergency. Get help right away. Call 911. Do not wait to see if the symptoms will go away. Do not drive yourself to the hospital. This information is not intended to replace advice given to you by your health care provider. Make sure you discuss any questions you have with your health care provider. Document Revised: 12/08/2021 Document Reviewed: 12/08/2021 Elsevier Patient Education  [place].

## 2022-06-29 ENCOUNTER — Encounter (HOSPITAL_COMMUNITY)
Admission: RE | Admit: 2022-06-29 | Discharge: 2022-06-29 | Disposition: A | Discharge status: Home / Self Care | Payer: Medicaid Other | Source: Ambulatory Visit | Attending: Internal Medicine | Admitting: Internal Medicine

## 2022-06-29 ENCOUNTER — Encounter (HOSPITAL_COMMUNITY): Payer: Self-pay

## 2022-06-29 VITALS — BP 104/77 | HR 86 | Temp 97.6°F | Resp 18 | Ht 64.0 in | Wt 137.0 lb

## 2022-06-29 DIAGNOSIS — Z01818 Encounter for other preprocedural examination: Secondary | ICD-10-CM | POA: Diagnosis not present

## 2022-06-29 DIAGNOSIS — I10 Essential (primary) hypertension: Secondary | ICD-10-CM

## 2022-06-29 DIAGNOSIS — E1142 Type 2 diabetes mellitus with diabetic polyneuropathy: Secondary | ICD-10-CM

## 2022-06-29 HISTORY — DX: Other complications of anesthesia, initial encounter: T88.59XA

## 2022-06-29 LAB — BASIC METABOLIC PANEL
Anion gap: 7 (ref 5–15)
BUN: 8 mg/dL (ref 8–23)
CO2: 31 mmol/L (ref 22–32)
Calcium: 8.8 mg/dL — ABNORMAL LOW (ref 8.9–10.3)
Chloride: 100 mmol/L (ref 98–111)
Creatinine, Ser: 0.62 mg/dL (ref 0.44–1.00)
GFR, Estimated: >60 mL/min (ref >60)
Glucose, Bld: 89 mg/dL (ref 70–99)
Potassium: 3.9 mmol/L (ref 3.5–5.1)
Sodium: 138 mmol/L (ref 135–145)

## 2022-06-29 LAB — SURGICAL PCR SCREEN
MRSA, PCR: POSITIVE — AB
Staphylococcus aureus: POSITIVE — AB

## 2022-07-01 ENCOUNTER — Other Ambulatory Visit: Payer: Self-pay | Admitting: *Deleted

## 2022-07-01 ENCOUNTER — Ambulatory Visit (HOSPITAL_COMMUNITY): Payer: Medicaid Other | Admitting: Anesthesiology

## 2022-07-01 ENCOUNTER — Ambulatory Visit (HOSPITAL_BASED_OUTPATIENT_CLINIC_OR_DEPARTMENT_OTHER): Payer: Medicaid Other | Admitting: Anesthesiology

## 2022-07-01 ENCOUNTER — Telehealth: Payer: Self-pay | Admitting: *Deleted

## 2022-07-01 ENCOUNTER — Encounter (HOSPITAL_COMMUNITY)
Admission: RE | Disposition: A | Discharge status: Home / Self Care | Payer: Self-pay | Source: Ambulatory Visit | Attending: Internal Medicine

## 2022-07-01 ENCOUNTER — Ambulatory Visit (HOSPITAL_COMMUNITY)
Admission: RE | Admit: 2022-07-01 | Discharge: 2022-07-01 | Disposition: A | Discharge status: Home / Self Care | Payer: Medicaid Other | Source: Ambulatory Visit | Attending: Internal Medicine | Admitting: Internal Medicine

## 2022-07-01 ENCOUNTER — Encounter (HOSPITAL_COMMUNITY): Payer: Self-pay | Admitting: Internal Medicine

## 2022-07-01 DIAGNOSIS — G4730 Sleep apnea, unspecified: Secondary | ICD-10-CM | POA: Insufficient documentation

## 2022-07-01 DIAGNOSIS — K209 Esophagitis, unspecified without bleeding: Secondary | ICD-10-CM

## 2022-07-01 DIAGNOSIS — K449 Diaphragmatic hernia without obstruction or gangrene: Secondary | ICD-10-CM | POA: Insufficient documentation

## 2022-07-01 DIAGNOSIS — I10 Essential (primary) hypertension: Secondary | ICD-10-CM | POA: Insufficient documentation

## 2022-07-01 DIAGNOSIS — R1310 Dysphagia, unspecified: Secondary | ICD-10-CM

## 2022-07-01 DIAGNOSIS — F17210 Nicotine dependence, cigarettes, uncomplicated: Secondary | ICD-10-CM | POA: Insufficient documentation

## 2022-07-01 DIAGNOSIS — R110 Nausea: Secondary | ICD-10-CM

## 2022-07-01 DIAGNOSIS — Z7984 Long term (current) use of oral hypoglycemic drugs: Secondary | ICD-10-CM | POA: Insufficient documentation

## 2022-07-01 DIAGNOSIS — F418 Other specified anxiety disorders: Secondary | ICD-10-CM | POA: Diagnosis not present

## 2022-07-01 DIAGNOSIS — J45909 Unspecified asthma, uncomplicated: Secondary | ICD-10-CM | POA: Diagnosis not present

## 2022-07-01 DIAGNOSIS — Z1211 Encounter for screening for malignant neoplasm of colon: Secondary | ICD-10-CM

## 2022-07-01 DIAGNOSIS — D127 Benign neoplasm of rectosigmoid junction: Secondary | ICD-10-CM

## 2022-07-01 DIAGNOSIS — E119 Type 2 diabetes mellitus without complications: Secondary | ICD-10-CM | POA: Insufficient documentation

## 2022-07-01 DIAGNOSIS — K5903 Drug induced constipation: Secondary | ICD-10-CM

## 2022-07-01 DIAGNOSIS — J449 Chronic obstructive pulmonary disease, unspecified: Secondary | ICD-10-CM | POA: Diagnosis not present

## 2022-07-01 DIAGNOSIS — Z86010 Personal history of colonic polyps: Secondary | ICD-10-CM | POA: Diagnosis not present

## 2022-07-01 DIAGNOSIS — R12 Heartburn: Secondary | ICD-10-CM | POA: Diagnosis not present

## 2022-07-01 DIAGNOSIS — K319 Disease of stomach and duodenum, unspecified: Secondary | ICD-10-CM | POA: Insufficient documentation

## 2022-07-01 DIAGNOSIS — K2100 Gastro-esophageal reflux disease with esophagitis, without bleeding: Secondary | ICD-10-CM | POA: Insufficient documentation

## 2022-07-01 DIAGNOSIS — K219 Gastro-esophageal reflux disease without esophagitis: Secondary | ICD-10-CM

## 2022-07-01 DIAGNOSIS — Z1212 Encounter for screening for malignant neoplasm of rectum: Secondary | ICD-10-CM | POA: Diagnosis not present

## 2022-07-01 DIAGNOSIS — D3615 Benign neoplasm of peripheral nerves and autonomic nervous system of abdomen: Secondary | ICD-10-CM | POA: Diagnosis not present

## 2022-07-01 HISTORY — PX: MALONEY DILATION: SHX5535

## 2022-07-01 HISTORY — PX: BIOPSY: SHX5522

## 2022-07-01 HISTORY — PX: HEMOSTASIS CLIP PLACEMENT: SHX6857

## 2022-07-01 HISTORY — PX: ESOPHAGOGASTRODUODENOSCOPY (EGD) WITH PROPOFOL: SHX5813

## 2022-07-01 HISTORY — PX: POLYPECTOMY: SHX5525

## 2022-07-01 HISTORY — PX: COLONOSCOPY WITH PROPOFOL: SHX5780

## 2022-07-01 LAB — GLUCOSE, CAPILLARY: Glucose-Capillary: 101 mg/dL — ABNORMAL HIGH (ref 70–99)

## 2022-07-01 SURGERY — COLONOSCOPY WITH PROPOFOL
Anesthesia: General

## 2022-07-01 MED ORDER — LIDOCAINE VISCOUS HCL 2 % MT SOLN
OROMUCOSAL | Status: AC
  Administered 2022-07-01: 15 mL via OROMUCOSAL
  Filled 2022-07-01: qty 15

## 2022-07-01 MED ORDER — IPRATROPIUM-ALBUTEROL 0.5-2.5 (3) MG/3ML IN SOLN
RESPIRATORY_TRACT | Status: AC
  Filled 2022-07-01: qty 3

## 2022-07-01 MED ORDER — PROPOFOL 1000 MG/100ML IV EMUL
INTRAVENOUS | Status: AC
  Filled 2022-07-01: qty 100

## 2022-07-01 MED ORDER — PROPOFOL 500 MG/50ML IV EMUL
INTRAVENOUS | Status: DC | PRN
  Administered 2022-07-01: 150 ug/kg/min via INTRAVENOUS

## 2022-07-01 MED ORDER — MUPIROCIN 2 % EX OINT: TOPICAL_OINTMENT | CUTANEOUS | 0 refills | Status: DC

## 2022-07-01 MED ORDER — METOPROLOL TARTRATE 5 MG/5ML IV SOLN
INTRAVENOUS | Status: AC
  Filled 2022-07-01: qty 5

## 2022-07-01 MED ORDER — KETAMINE HCL 10 MG/ML IJ SOLN
INTRAMUSCULAR | Status: DC | PRN
  Administered 2022-07-01: 10 mg via INTRAVENOUS
  Administered 2022-07-01: 20 mg via INTRAVENOUS

## 2022-07-01 MED ORDER — LACTATED RINGERS IV SOLN: INTRAVENOUS | Status: DC

## 2022-07-01 MED ORDER — IPRATROPIUM-ALBUTEROL 0.5-2.5 (3) MG/3ML IN SOLN
3.0000 mL | Freq: Once | RESPIRATORY_TRACT | Status: AC | PRN
  Administered 2022-07-01: 3 mL via RESPIRATORY_TRACT

## 2022-07-01 MED ORDER — METOPROLOL TARTRATE 5 MG/5ML IV SOLN
INTRAVENOUS | Status: DC | PRN
  Administered 2022-07-01 (×2): 2 mg via INTRAVENOUS

## 2022-07-01 MED ORDER — LIDOCAINE HCL (PF) 2 % IJ SOLN
INTRAMUSCULAR | Status: AC
  Filled 2022-07-01: qty 5

## 2022-07-01 MED ORDER — PROPOFOL 10 MG/ML IV BOLUS
INTRAVENOUS | Status: DC | PRN
  Administered 2022-07-01 (×2): 50 mg via INTRAVENOUS

## 2022-07-01 MED ORDER — LIDOCAINE HCL (CARDIAC) PF 100 MG/5ML IV SOSY
PREFILLED_SYRINGE | INTRAVENOUS | Status: DC | PRN
  Administered 2022-07-01: 100 mg via INTRAVENOUS

## 2022-07-01 MED ORDER — LIDOCAINE VISCOUS HCL 2 % MT SOLN: 15.0000 mL | Freq: Once | OROMUCOSAL | Status: AC

## 2022-07-01 MED ORDER — STERILE WATER FOR IRRIGATION IR SOLN
Status: DC | PRN
  Administered 2022-07-01: 180 mL

## 2022-07-01 MED ORDER — KETAMINE HCL 50 MG/5ML IJ SOSY
PREFILLED_SYRINGE | INTRAMUSCULAR | Status: AC
  Filled 2022-07-01: qty 5

## 2022-07-01 NOTE — Anesthesia Postprocedure Evaluation (Signed)
Anesthesia Post Note  Patient: Margaret Vaughn  Procedure(s) Performed: COLONOSCOPY WITH PROPOFOL ESOPHAGOGASTRODUODENOSCOPY (EGD) WITH PROPOFOL [place]  Patient location during evaluation: Phase II Anesthesia Type: General Level of consciousness: awake and alert and oriented Pain management: pain level controlled Vital Signs Assessment: post-procedure vital signs reviewed and stable Respiratory status: spontaneous breathing, nonlabored ventilation and respiratory function stable Cardiovascular status: blood pressure returned to baseline and stable Postop Assessment: no apparent nausea or vomiting Anesthetic complications: no  No notable events documented.   Last Vitals:  Vitals:   07/01/22 0759 07/01/22 0959  BP: 132/83 100/69  Pulse:  88  Resp:  14  Temp: 37.1 C 36.7 C  SpO2:  92%    Last Pain:  Vitals:   07/01/22 0959  TempSrc: Oral  PainSc: 0-No pain                 Breahna Boylen C Leilene Diprima

## 2022-07-01 NOTE — Discharge Instructions (Signed)
Colonoscopy Discharge Instructions  Read the instructions outlined below and refer to this sheet in the next few weeks. These discharge instructions provide you with general information on caring for yourself after you leave the hospital. Your doctor may also give you specific instructions. While your treatment has been planned according to the most current medical practices available, unavoidable complications occasionally occur. If you have any problems or questions after discharge, call Dr. Gala Romney at [phone]. ACTIVITY You may resume your regular activity, but move at a slower pace for the next 24 hours.  Take frequent rest periods for the next 24 hours.  Walking will help get rid of the air and reduce the bloated feeling in your belly (abdomen).  No driving for 24 hours (because of the medicine (anesthesia) used during the test).   Do not sign any important legal documents or operate any machinery for 24 hours (because of the anesthesia used during the test).  NUTRITION Drink plenty of fluids.  You may resume your normal diet as instructed by your doctor.  Begin with a light meal and progress to your normal diet. Heavy or fried foods are harder to digest and may make you feel sick to your stomach (nauseated).  Avoid alcoholic beverages for 24 hours or as instructed.  MEDICATIONS You may resume your normal medications unless your doctor tells you otherwise.  WHAT YOU CAN EXPECT TODAY Some feelings of bloating in the abdomen.  Passage of more gas than usual.  Spotting of blood in your stool or on the toilet paper.  IF YOU HAD POLYPS REMOVED DURING THE COLONOSCOPY: No aspirin products for 7 days or as instructed.  No alcohol for 7 days or as instructed.  Eat a soft diet for the next 24 hours.  FINDING OUT THE RESULTS OF YOUR TEST Not all test results are available during your visit. If your test results are not back during the visit, make an appointment with your caregiver to find out the  results. Do not assume everything is normal if you have not heard from your caregiver or the medical facility. It is important for you to follow up on all of your test results.  SEEK IMMEDIATE MEDICAL ATTENTION IF: You have more than a spotting of blood in your stool.  Your belly is swollen (abdominal distention).  You are nauseated or vomiting.  You have a temperature over 101.  You have abdominal pain or discomfort that is severe or gets worse throughout the day.   EGD Discharge instructions Please read the instructions outlined below and refer to this sheet in the next few weeks. These discharge instructions provide you with general information on caring for yourself after you leave the hospital. Your doctor may also give you specific instructions. While your treatment has been planned according to the most current medical practices available, unavoidable complications occasionally occur. If you have any problems or questions after discharge, please call your doctor. ACTIVITY You may resume your regular activity but move at a slower pace for the next 24 hours.  Take frequent rest periods for the next 24 hours.  Walking will help expel (get rid of) the air and reduce the bloated feeling in your abdomen.  No driving for 24 hours (because of the anesthesia (medicine) used during the test).  You may shower.  Do not sign any important legal documents or operate any machinery for 24 hours (because of the anesthesia used during the test).  NUTRITION Drink plenty of fluids.  You may  resume your normal diet.  Begin with a light meal and progress to your normal diet.  Avoid alcoholic beverages for 24 hours or as instructed by your caregiver.  MEDICATIONS You may resume your normal medications unless your caregiver tells you otherwise.  WHAT YOU CAN EXPECT TODAY You may experience abdominal discomfort such as a feeling of fullness or "gas" pains.  FOLLOW-UP Your doctor will discuss the results of  your test with you.  SEEK IMMEDIATE MEDICAL ATTENTION IF ANY OF THE FOLLOWING OCCUR: Excessive nausea (feeling sick to your stomach) and/or vomiting.  Severe abdominal pain and distention (swelling).  Trouble swallowing.  Temperature over 101 F (37.8 C).  Rectal bleeding or vomiting of blood.      reflux irritation in your esophagus and your stomach was inflamed.  Your esophagus was dilated.  Biopsies were taken.    2 polyps removed from your colon   further recommendations to follow once the lab report is back for review   no future MRI until colon clips are gone   office visit with Venetia Night in 4 weeks   at patient request, I called Hardin Negus  at [phone] findings and recommendations

## 2022-07-01 NOTE — Anesthesia Preprocedure Evaluation (Addendum)
Anesthesia Evaluation  Patient identified by MRN, date of birth, ID band Patient awake    Reviewed: Allergy & Precautions, H&P , NPO status , Patient's Chart, lab work & pertinent test results  History of Anesthesia Complications (+) PROLONGED EMERGENCE and history of anesthetic complications  Airway Mallampati: II  TM Distance: >3 FB Neck ROM: Limited   Comment: Neck fusion Dental  (+) Edentulous Upper, Edentulous Lower   Pulmonary shortness of breath, with exertion and Long-Term Oxygen Therapy, asthma , sleep apnea (noncomplaint with CPAP), Continuous Positive Airway Pressure Ventilation and Oxygen sleep apnea , pneumonia, COPD,  oxygen dependent, Current Smoker and Patient abstained from smoking.   breath sounds clear to auscultation + decreased breath sounds      Cardiovascular Exercise Tolerance: Poor hypertension, Pt. on medications + dysrhythmias Supra Ventricular Tachycardia + Valvular Problems/Murmurs MVP  Rhythm:Regular Rate:Normal     Neuro/Psych  Headaches PSYCHIATRIC DISORDERS Anxiety Depression     Neuromuscular disease (neck fusion, Cervical spinal cord compression (HCC))    GI/Hepatic Neg liver ROS, hiatal hernia,GERD  Medicated,,  Endo/Other  diabetes, Well Controlled, Type 2, Oral Hypoglycemic Agents    Renal/GU negative Renal ROS  negative genitourinary   Musculoskeletal  (+) Arthritis , Osteoarthritis,  Fibromyalgia -  Abdominal   Peds negative pediatric ROS (+)  Hematology  (+) Blood dyscrasia, anemia   Anesthesia Other Findings   Reproductive/Obstetrics negative OB ROS                             Anesthesia Physical Anesthesia Plan  ASA: 4  Anesthesia Plan: General   Post-op Pain Management: Minimal or no pain anticipated   Induction: Intravenous  PONV Risk Score and Plan: 1 and Propofol infusion  Airway Management Planned: Nasal Cannula and Natural  Airway  Additional Equipment:   Intra-op Plan:   Post-operative Plan:   Informed Consent: I have reviewed the patients History and Physical, chart, labs and discussed the procedure including the risks, benefits and alternatives for the proposed anesthesia with the patient or authorized representative who has indicated his/her understanding and acceptance.       Plan Discussed with: CRNA and Surgeon  Anesthesia Plan Comments: (Room air saturations in 80s, on 2l oxygen saturation 92%, will give duoneb nebulizer treatment pre op. Postoperatively patient may need portable oxygen cylinder to go home. As per patient, portable cylinder available. )       Anesthesia Quick Evaluation

## 2022-07-01 NOTE — Telephone Encounter (Signed)
Staff message from Dr. Felipe Drone copied below that was forwarded to me:  From: Daneil Dolin, MD  Sent: 06/30/2022   9:55 AM EST  To: Lodema Hong, CMA   Prescription for Mupricon ointment.  Apply in both nares twice daily x 5 days.  No refills.  For MRSA colonization.   I sent in Rx for mupirocin ointment apply to both nares bid for 5 days. 0 refills. Med sent to [facility] and patient was notified on voicemail that rx sent to pharm.

## 2022-07-01 NOTE — Op Note (Signed)
[facility] Patient Name: Margaret Vaughn Procedure Date: 07/01/2022 9:00 AM MRN: [id number] Date of Birth: September 20, 1960 Attending MD: Norvel Richards , MD, [id number] CSN: [id number] Age: 61 Admit Type: Outpatient Procedure:                Colonoscopy Indications:              Screening for colorectal malignant neoplasm Providers:                Norvel Richards, MD, Lambert Mody,                            Everardo Pacific Referring MD:              Medicines:                Propofol per Anesthesia Complications:            No immediate complications. Estimated Blood Loss:     Estimated blood loss: none. Procedure:                Pre-Anesthesia Assessment:                           - Prior to the procedure, a History and Physical                            was performed, and patient medications and                            allergies were reviewed. The patient's tolerance of                            previous anesthesia was also reviewed. The risks                            and benefits of the procedure and the sedation                            options and risks were discussed with the patient.                            All questions were answered, and informed consent                            was obtained. Prior Anticoagulants: The patient has                            taken no anticoagulant or antiplatelet agents. ASA                            Grade Assessment: III - A patient with severe                            systemic disease. After reviewing the risks and  benefits, the patient was deemed in satisfactory                            condition to undergo the procedure.                           After obtaining informed consent, the colonoscope                            was passed under direct vision. Throughout the                            procedure, the patient's blood pressure, pulse, and                             oxygen saturations were monitored continuously. The                            [id number]) scope was introduced through the                            anus and advanced to the the cecum, identified by                            appendiceal orifice and ileocecal valve. The                            colonoscopy was performed without difficulty. The                            patient tolerated the procedure well. The quality                            of the bowel preparation was adequate. The                            ileocecal valve, appendiceal orifice, and rectum                            were photographed. The colonoscopy was performed                            without difficulty. Scope In: 9:28:32 AM Scope Out: 9:52:35 AM Scope Withdrawal Time: 0 hours 17 minutes 4 seconds  Total Procedure Duration: 0 hours 24 minutes 3 seconds  Findings:      The perianal and digital rectal examinations were normal.      An 8 mm polyp was found in the recto-sigmoid colon. The polyp was       sessile. The polyp was removed with a cold snare. Resection and       retrieval were complete. A second 8 mm polyp in the base the cecum       between 2 polyps was attempted to be removed cold but cannot get a       resection ultimately, was hot snare removed. A clip  x 1. Clip x 1 on the       rectosigmoid polyp as well. Multiple small hyperplastic appearing mamma       lesions in the rectum rectosigmoid not manipulated no retroflexion due       to the polypectomy site with clip placement. Impression:               - One 8 mm polyp at the recto-sigmoid colon,                            removed with a hot snare. Resected and retrieved.                            Cecal polyp resected. 1 clip on each polypectomy                            base. Moderate Sedation:      Moderate (conscious) sedation was personally administered by an       anesthesia professional. The following parameters were monitored:  oxygen       saturation, heart rate, blood pressure, and response to care. Recommendation:           - Patient has a contact number available for                            emergencies. The signs and symptoms of potential                            delayed complications were discussed with the                            patient. Return to normal activities tomorrow.                            Written discharge instructions were provided to the                            patient.                           - Advance diet as tolerated. Follow-up on                            pathology. No MRI until clips gone. See EGD report. Procedure Code(s):        --- Professional ---                           [id number], Colonoscopy, flexible; with removal of                            tumor(s), polyp(s), or other lesion(s) by snare                            technique Diagnosis Code(s):        --- Professional ---  Z12.11, Encounter for screening for malignant                            neoplasm of colon                           D12.7, Benign neoplasm of rectosigmoid junction CPT copyright 2022 American Medical Association. All rights reserved. The codes documented in this report are preliminary and upon coder review may  be revised to meet current compliance requirements. Cristopher Estimable. Kirra Verga, MD Norvel Richards, MD 07/01/2022 9:59:33 AM This report has been signed electronically. Number of Addenda: 0

## 2022-07-01 NOTE — H&P (Signed)
$'@LOGO'T$ @   Primary Care Physician:  Dettinger, Fransisca Kaufmann, MD Primary Gastroenterologist:  Dr. Gala Romney  Pre-Procedure History & Physical: HPI:  Margaret Vaughn is a 61 y.o. female here for  screening colonoscopy.  Last colonoscopy 2011 [facility]-hyperplastic polyp also refractory GERD and nausea.    Patient says Nexium twice daily has helped.  Does note intermittent esophageal dysphagia to food.  Past Medical History:  Diagnosis Date   Acid reflux    Allergies    Anemia    Asthma    Bronchitis    Cervical stenosis of spine    Chronic pain syndrome    Complication of anesthesia    difficulty waking up from anesthesia   COPD (chronic obstructive pulmonary disease) ([place])    DDD (degenerative disc disease), lumbar    Depression with anxiety    Diabetes mellitus without complication ([place])    Dyspnea    Dysrhythmia    PSVT   Fatigue    H/O blood clots    H/O echocardiogram 1989   Per pt, done @ [facility] mitral valve prolapse   Headache(784.0)    Heart murmur    Hemorrhoids, external    History of hiatal hernia    History of kidney stones    History of PSVT (paroxysmal supraventricular tachycardia)    HTN (hypertension)    Hyperlipidemia    Hypoxemia    Insomnia    Memory loss    Myalgia and myositis    Narcolepsy    On home O2    2-4 L @ HS   OSA (obstructive sleep apnea)    Pneumonia    Sleeping difficulty    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 as a child   OOPHORECTOMY      POSTERIOR CERVICAL FUSION/FORAMINOTOMY N/A 08/07/2020   Procedure: POSTERIOR CERVICAL LAMINECTOMY FOR MYELOPATHY;  Surgeon: Kary Kos, MD;  Location: [place];  Service: Neurosurgery;  Laterality: N/A;   POSTERIOR CERVICAL FUSION/FORAMINOTOMY N/A 03/10/2021   Procedure: Posterior cervical fusion with lateral mass fixation Cervical one - cervical four;  Surgeon: Kary Kos, MD;  Location: [place];  Service: Neurosurgery;  Laterality: N/A;   POSTERIOR CERVICAL LAMINECTOMY  08/07/2020   w/ Dr. Saintclair Halsted   tonsillectomy     TONSILLECTOMY     TUBAL LIGATION      Prior to Admission medications   Medication Sig Start Date End Date Taking? Authorizing Provider  albuterol (VENTOLIN HFA) 108 (90 Base) MCG/ACT inhaler INHALE 2 PUFFS BY MOUTH EVERY 6 HOURS AS NEEDED FOR WHEEZE OR SHORTNESS OF BREATH 04/10/22  Yes Hunsucker, Bonna Gains, MD  aspirin EC 81 MG tablet Take 81 mg by mouth daily. Swallow whole.   Yes [provider]  atorvastatin (LIPITOR) 40 MG tablet Take 1 tablet (40 mg total) by mouth daily. 09/10/21  Yes Dettinger, Fransisca Kaufmann, MD  baclofen (LIORESAL) 10 MG tablet Take 10 mg by mouth 3 (three) times daily as needed for  muscle spasms. 06/12/22  Yes [provider]  budesonide-formoterol (SYMBICORT) 160-4.5 MCG/ACT inhaler Inhale 2 puffs into the lungs 2 (two) times daily. 04/10/22  Yes Hunsucker, Bonna Gains, MD  cholecalciferol (VITAMIN D3) 25 MCG (1000 UNIT) tablet Take 1,000 Units by mouth daily.   Yes [provider]  Cyanocobalamin (B-12 PO) Take 1 tablet by mouth daily.   Yes [provider]  cyclobenzaprine (FLEXERIL) 10 MG tablet Take 1 tablet (10 mg total) by mouth at bedtime as needed for muscle spasms. 03/20/22  Yes Dettinger, Fransisca Kaufmann, MD  DULoxetine (CYMBALTA) 60 MG capsule Take 1 capsule (60 mg total) by mouth daily. TAKE 1 CAPSULE BY MOUTH EVERY DAY 06/26/22  Yes Dettinger, Fransisca Kaufmann, MD  enalapril (VASOTEC) 5 MG tablet Take 1 tablet (5 mg total) by mouth daily.  09/10/21  Yes Dettinger, Fransisca Kaufmann, MD  esomeprazole (NEXIUM) 40 MG capsule Take 1 capsule (40 mg total) by mouth daily. 01/08/22  Yes Mahon, Courtney L, NP  ipratropium-albuterol (DUONEB) 0.5-2.5 (3) MG/3ML SOLN Take 3 mLs by nebulization every 6 (six) hours as needed. 04/10/22  Yes Hunsucker, Bonna Gains, MD  lubiprostone (AMITIZA) 24 MCG capsule Take 1 capsule (24 mcg total) by mouth 2 (two) times daily with a meal. Patient taking differently: Take 24 mcg by mouth daily as needed for constipation. 01/08/22  Yes Mahon, Lenise Arena, NP  Multiple Vitamins-Minerals (HAIR SKIN NAILS PO) Take 3 tablets by mouth daily.   Yes [provider]  Multiple Vitamins-Minerals (MULTIVITAMIN WITH MINERALS) tablet Take 1 tablet by mouth daily.   Yes [provider]  nicotine (NICODERM CQ - DOSED IN MG/24 HOURS) 21 mg/24hr patch Place 21 mg onto the skin daily as needed (smoking cessation).   Yes [provider]  nitrofurantoin (MACRODANTIN) 100 MG capsule Take 100 mg by mouth daily.   Yes [provider]  pregabalin (LYRICA) 150 MG capsule Take 1 capsule (150 mg total) by mouth 3 (three) times daily. 06/26/20  Yes Dettinger, Fransisca Kaufmann, MD  ramelteon (ROZEREM) 8 MG tablet Take 1 tablet (8 mg total) by mouth at bedtime. 06/23/22  Yes Hunsucker, Bonna Gains, MD  sitaGLIPtin-metformin (JANUMET) 50-1000 MG tablet TAKE 1 TABLET BY MOUTH TWICE A DAY WITH A MEAL 09/10/21  Yes Dettinger, Fransisca Kaufmann, MD  Tiotropium Bromide Monohydrate (SPIRIVA RESPIMAT) 2.5 MCG/ACT AERS Inhale 2 puffs into the lungs daily. 06/22/22  Yes Hunsucker, Bonna Gains, MD  XTAMPZA ER 13.5 MG C12A Take 13.5 mg by mouth 2 (two) times daily. 08/02/20  Yes [provider]  oxyCODONE (OXYCONTIN) 10 mg 12 hr tablet Take 1 tablet (10 mg total) by mouth 2 (two) times daily. 03/14/21   Kary Kos, MD    Allergies as of 05/06/2022 - Review Complete 04/10/2022  Allergen Reaction Noted   Iohexol Anaphylaxis 06/11/2004   Moxifloxacin  Anaphylaxis    Shellfish allergy Hives 08/10/2012    Family History  Problem Relation Age of Onset   Ulcers Mother    Sleep apnea Mother    Migraines Mother    Cancer Mother        uterine   Arthritis Mother    Hypertension Mother    Heart disease Mother    Depression Mother    Hyperlipidemia Mother    Colon cancer Mother    Arrhythmia Son     Social History   Socioeconomic History   Marital status: Married    Spouse name: Not on file   Number of children: 60  Years of education: Not on file   Highest education level: Not on file  Occupational History   Occupation: disabled    Employer: UNEMPLOYED  Tobacco Use   Smoking status: Every Day    Packs/day: 2.00    Years: 30.00    Total pack years: 60.00    Types: Cigarettes   Smokeless tobacco: Never   Tobacco comments:    Pt states she is down to 1 ppd. 06/22/2022   Vaping Use   Vaping Use: Never used  Substance and Sexual Activity   Alcohol use: No   Drug use: No   Sexual activity: Yes    Birth control/protection: Surgical  Other Topics Concern   Not on file  Social History Narrative   Not on file   Social Determinants of Health   Financial Resource Strain: Not on file  Food Insecurity: Not on file  Transportation Needs: Not on file  Physical Activity: Not on file  Stress: Not on file  Social Connections: Not on file  Intimate Partner Violence: Not on file    Review of Systems: See HPI, otherwise negative ROS  Physical Exam: BP 132/83   Temp 98.7 F (37.1 C) (Oral)  General:   Alert,  Well-developed, well-nourished, pleasant and cooperative in NAD SNeck:  Supple; no masses or thyromegaly. No significant cervical adenopathy. Lungs:  Clear throughout to auscultation.   No wheezes, crackles, or rhonchi. No acute distress. Heart:  Regular rate and rhythm; no murmurs, clicks, rubs,  or gallops. Abdomen: Non-distended, normal bowel sounds.  Soft and nontender without appreciable mass or  hepatosplenomegaly.  Pulses:  Normal pulses noted. Extremities:  Without clubbing or edema.  Impression/Plan:    62 year old lady with refractory GERD nausea now reports esophageal dysphagia.  It has been over 10 years since she had a colonoscopy.  I have offered the patient both an EGD with possible esophageal dilation as feasible/appropriate and a colonoscopy per plan. The risks, benefits, limitations, alternatives and imponderables have been reviewed with the patient. Questions have been answered. All parties are agreeable.  The risks, benefits, limitations, alternatives and imponderables have been reviewed with the patient. Potential for esophageal dilation, biopsy, etc. have also been reviewed.  Questions have been answered. All parties agreeable.      Notice: This dictation was prepared with Dragon dictation along with smaller phrase technology. Any transcriptional errors that result from this process are unintentional and may not be corrected upon review.

## 2022-07-01 NOTE — Op Note (Signed)
[facility] Patient Name: Margaret Vaughn Procedure Date: 07/01/2022 9:00 AM MRN: [id number] Date of Birth: 1961-04-29 Attending MD: Norvel Richards , MD, [id number] CSN: [id number] Age: 61 Admit Type: Outpatient Procedure:                Upper GI endoscopy Indications:              Dysphagia, Heartburn Providers:                Norvel Richards, MD, Lambert Mody,                            Everardo Pacific Referring MD:              Medicines:                Propofol per Anesthesia Complications:            No immediate complications. Estimated Blood Loss:     Estimated blood loss was minimal. Procedure:                Pre-Anesthesia Assessment:                           - Prior to the procedure, a History and Physical                            was performed, and patient medications and                            allergies were reviewed. The patient's tolerance of                            previous anesthesia was also reviewed. The risks                            and benefits of the procedure and the sedation                            options and risks were discussed with the patient.                            All questions were answered, and informed consent                            was obtained. Prior Anticoagulants: The patient has                            taken no anticoagulant or antiplatelet agents. ASA                            Grade Assessment: III - A patient with severe                            systemic disease. After reviewing the risks and  benefits, the patient was deemed in satisfactory                            condition to undergo the procedure.                           After obtaining informed consent, the endoscope was                            passed under direct vision. Throughout the                            procedure, the patient's blood pressure, pulse, and                            oxygen  saturations were monitored continuously. The                            GIF-H190 ([id number]) scope was introduced through the                            mouth, and advanced to the second part of duodenum.                            The upper GI endoscopy was accomplished without                            difficulty. The patient tolerated the procedure                            well. Scope In: 9:16:06 AM Scope Out: 9:23:03 AM Total Procedure Duration: 0 hours 6 minutes 57 seconds  Findings:      LA Grade B (one or more mucosal breaks greater than 5 mm, not extending       between the tops of two mucosal folds) esophagitis with no bleeding was       found. No Barrett's epithelium seen.      Gastric cavity empty. Mottling diffuse fine submucosal hemorrhage       streaky erythema in the antrum. No ulcer or infiltrating process       observed. Pylorus was patent.      The duodenal bulb and second portion of the duodenum were normal. The       scope was withdrawn. Dilation was performed with a Maloney dilator with       mild resistance at 52 Fr. The dilation site was examined following       endoscope reinsertion and showed no change. Estimated blood loss was       minimal.      Finally, biopsies of the antrum and gastric body taken for histology. Impression:               - LA Grade B esophagitis with no bleeding. Dilated.                            Inflamed appearing gastric mucosa as described  above. Status post biopsy.                           - Normal duodenal bulb and second portion of the                            duodenum.                           - Moderate Sedation:      Moderate (conscious) sedation was personally administered by an       anesthesia professional. The following parameters were monitored: oxygen       saturation, heart rate, blood pressure, respiratory rate, EKG, adequacy       of pulmonary ventilation, and response to  care. Recommendation:           - Patient has a contact number available for                            emergencies. The signs and symptoms of potential                            delayed complications were discussed with the                            patient. Return to normal activities tomorrow.                            Written discharge instructions were provided to the                            patient.                           - Advance diet as tolerated.                           - Continue present medications. Follow-up on                            pathology.                           - Return to my office in 4 weeks. See colonoscopy                            report. Procedure Code(s):        --- Professional ---                           [phone], Esophagogastroduodenoscopy, flexible,                            transoral; diagnostic, including collection of                            specimen(s) by brushing or washing, when performed                            (  separate procedure)                           43450, Dilation of esophagus, by unguided sound or                            bougie, single or multiple passes Diagnosis Code(s):        --- Professional ---                           K20.90, Esophagitis, unspecified without bleeding                           R13.10, Dysphagia, unspecified                           R12, Heartburn CPT copyright 2022 [employer]. All rights reserved. The codes documented in this report are preliminary and upon coder review may  be revised to meet current compliance requirements. Cristopher Estimable. Lasharn Bufkin, MD Norvel Richards, MD 07/01/2022 9:54:59 AM This report has been signed electronically. Number of Addenda: 0

## 2022-07-01 NOTE — Transfer of Care (Signed)
Immediate Anesthesia Transfer of Care Note  Patient: Margaret Vaughn  Procedure(s) Performed: COLONOSCOPY WITH PROPOFOL ESOPHAGOGASTRODUODENOSCOPY (EGD) WITH PROPOFOL [place]  Patient Location: Short Stay  Anesthesia Type:General  Level of Consciousness: awake, alert , and patient cooperative  Airway & Oxygen Therapy: Patient Spontanous Breathing and Patient connected to nasal cannula oxygen  Post-op Assessment: Report given to RN, Post -op Vital signs reviewed and stable, and Patient moving all extremities X 4  Post vital signs: Reviewed and stable  Last Vitals:  Vitals Value Taken Time  BP 100/69 07/01/22 0959  Temp 36.7 C 07/01/22 0959  Pulse 88 07/01/22 0959  Resp 14 07/01/22 0959  SpO2 92 % 07/01/22 0959    Last Pain:  Vitals:   07/01/22 0959  TempSrc: Oral  PainSc: 0-No pain         Complications: No notable events documented.

## 2022-07-03 ENCOUNTER — Telehealth: Payer: Self-pay

## 2022-07-03 DIAGNOSIS — R030 Elevated blood-pressure reading, without diagnosis of hypertension: Secondary | ICD-10-CM | POA: Diagnosis not present

## 2022-07-03 DIAGNOSIS — F17200 Nicotine dependence, unspecified, uncomplicated: Secondary | ICD-10-CM | POA: Diagnosis not present

## 2022-07-03 DIAGNOSIS — Z79899 Other long term (current) drug therapy: Secondary | ICD-10-CM | POA: Diagnosis not present

## 2022-07-03 DIAGNOSIS — G950 Syringomyelia and syringobulbia: Secondary | ICD-10-CM | POA: Diagnosis not present

## 2022-07-03 DIAGNOSIS — F17210 Nicotine dependence, cigarettes, uncomplicated: Secondary | ICD-10-CM | POA: Diagnosis not present

## 2022-07-03 DIAGNOSIS — G894 Chronic pain syndrome: Secondary | ICD-10-CM | POA: Diagnosis not present

## 2022-07-03 DIAGNOSIS — J449 Chronic obstructive pulmonary disease, unspecified: Secondary | ICD-10-CM | POA: Diagnosis not present

## 2022-07-03 DIAGNOSIS — Z6823 Body mass index (BMI) 23.0-23.9, adult: Secondary | ICD-10-CM | POA: Diagnosis not present

## 2022-07-03 DIAGNOSIS — E119 Type 2 diabetes mellitus without complications: Secondary | ICD-10-CM | POA: Diagnosis not present

## 2022-07-03 DIAGNOSIS — M4802 Spinal stenosis, cervical region: Secondary | ICD-10-CM | POA: Diagnosis not present

## 2022-07-03 NOTE — Telephone Encounter (Signed)
-----   Message from Carmelina Noun, LPN sent at 47/07/8548 12:15 PM EST ----- Hi Berneda Piccininni, I sent in this rx for patient and called and left her a message on voicemail that rx was sent to [facility] since you are out of office today.  Abigail Butts  ----- Message ----- From: Inda Castle, CMA Sent: 07/01/2022  11:38 AM EST To: Carmelina Noun, LPN   ----- Message ----- From: Daneil Dolin, MD Sent: 06/30/2022   9:55 AM EST To: Lodema Hong, CMA  Prescription for Mupricon ointment.  Apply in both nares twice daily x 5 days.  No refills. For MRSA colonization.

## 2022-07-07 ENCOUNTER — Other Ambulatory Visit (HOSPITAL_COMMUNITY): Payer: Self-pay

## 2022-07-07 ENCOUNTER — Telehealth: Payer: Self-pay

## 2022-07-07 LAB — SURGICAL PATHOLOGY

## 2022-07-07 NOTE — Telephone Encounter (Signed)
PA request received via patients pharmacy for Ramelteon '8MG'$  tablets  PA submitted through [facility] through [facility]  Key: [id number] - PA Case ID: [id number]  PA has been APPROVED from 07/07/2022-01/03/2023  Patients pharmacy contacted [id number] [place], patient last filled 11/28 through discount card. Pharmacy will await patient request for refill to fill medication.

## 2022-07-07 NOTE — Telephone Encounter (Signed)
PA has been started, encounter created with additional information.

## 2022-07-08 ENCOUNTER — Other Ambulatory Visit: Payer: Self-pay | Admitting: Internal Medicine

## 2022-07-08 ENCOUNTER — Telehealth: Payer: Self-pay

## 2022-07-08 ENCOUNTER — Other Ambulatory Visit: Payer: Self-pay

## 2022-07-08 ENCOUNTER — Encounter (HOSPITAL_COMMUNITY): Payer: Self-pay | Admitting: Internal Medicine

## 2022-07-08 MED ORDER — MUPIROCIN 2 % EX OINT: TOPICAL_OINTMENT | CUTANEOUS | 0 refills | Status: DC

## 2022-07-08 NOTE — Telephone Encounter (Signed)
Pt is needing a refill on the mupirocin ointment sent [facility] [place]. Pt states that she accidentally stepped on the tube and it busted. Pt has not used it the full five days, states that she only used it for two days.

## 2022-07-08 NOTE — Telephone Encounter (Signed)
Rx was sent in. Pt was made aware.

## 2022-07-10 ENCOUNTER — Encounter: Payer: Self-pay | Admitting: Internal Medicine

## 2022-07-15 ENCOUNTER — Other Ambulatory Visit: Payer: Self-pay | Admitting: Pulmonary Disease

## 2022-07-16 ENCOUNTER — Other Ambulatory Visit: Payer: Self-pay

## 2022-07-16 NOTE — Telephone Encounter (Signed)
Ok with me 

## 2022-07-21 DIAGNOSIS — J449 Chronic obstructive pulmonary disease, unspecified: Secondary | ICD-10-CM | POA: Diagnosis not present

## 2022-07-21 DIAGNOSIS — J9601 Acute respiratory failure with hypoxia: Secondary | ICD-10-CM | POA: Diagnosis not present

## 2022-07-22 IMAGING — MR MR CERVICAL SPINE W/O CM
5 series · 35 of 48 positions shown · non-contrast
Comparison: MRI 08/07/2020

CLINICAL DATA: Neck pain. History of posterior cervical
decompression on 08/08/2020

EXAM:
MRI CERVICAL SPINE WITHOUT CONTRAST
TECHNIQUE: Multiplanar, multisequence MR imaging of the cervical spine was
performed. No intravenous contrast was administered.

[Series 5: T2 · sagittal · 3.0mm · 0.69mm/px · 6 of 15 slices shown (1 of 2)]
[im 1/15]
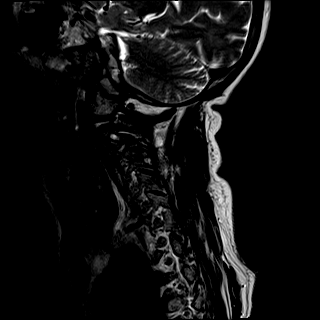
[im 3/15]
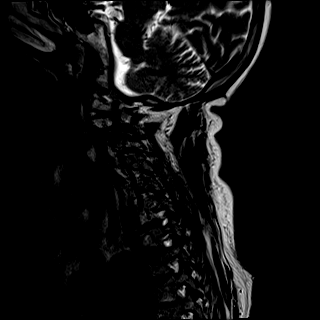
[im 6/15]
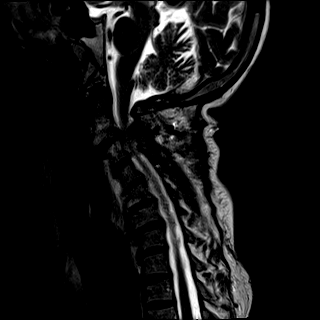
[im 9/15]
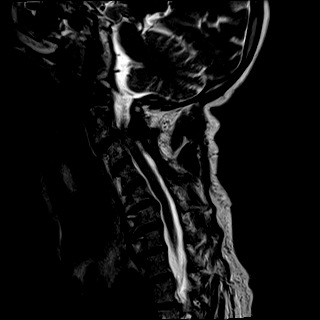
[im 12/15]
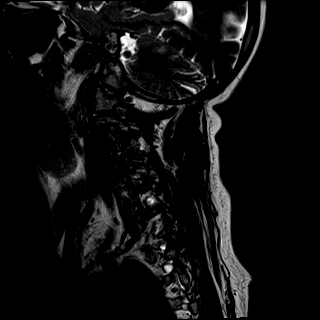
[im 15/15]
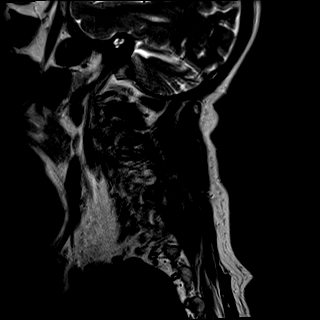

[Series 6: T1 · sagittal · 3.0mm · 0.69mm/px · 6 of 15 slices shown]
[im 1/15]
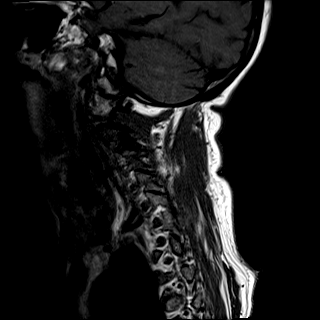
[im 3/15]
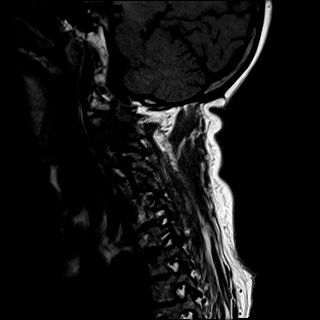
[im 6/15]
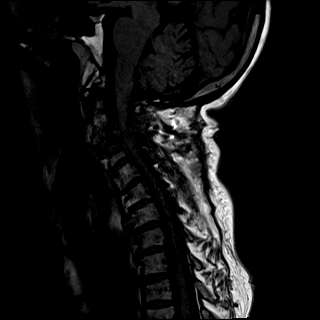
[im 9/15]
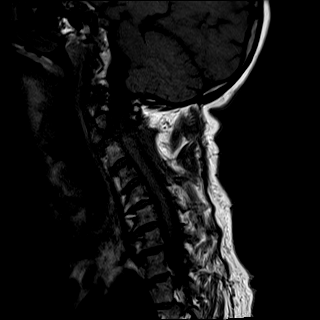
[im 12/15]
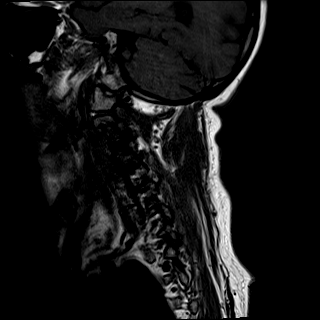
[im 15/15]
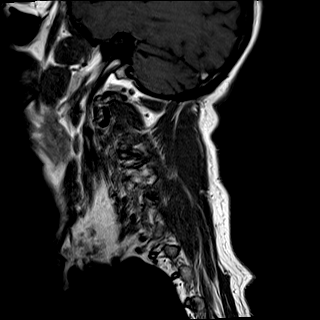

[Series 7: STIR · sagittal · 3.0mm · 0.86mm/px · 6 of 15 slices shown]
[im 1/15]
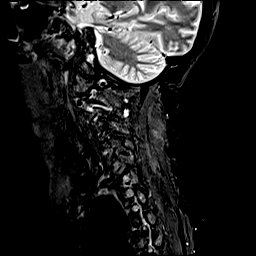
[im 3/15]
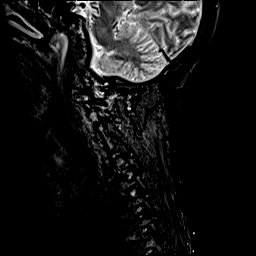
[im 6/15]
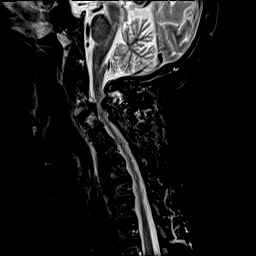
[im 9/15]
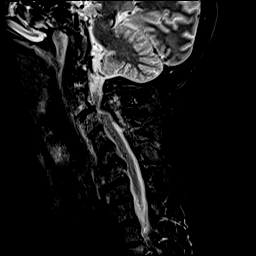
[im 12/15]
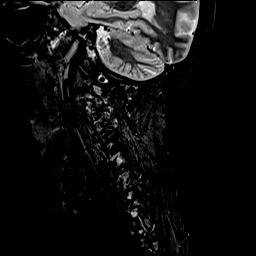
[im 15/15]
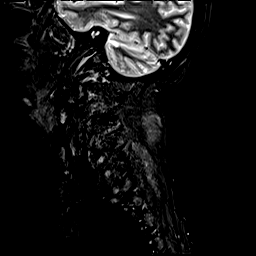

[Series 8: T2 · axial · 3.0mm · 0.66mm/px · z∈[-166,-58]mm · 9 of 40 slices shown (2 of 2)]
[im 1/40]
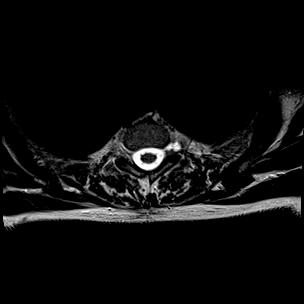
[im 6/40]
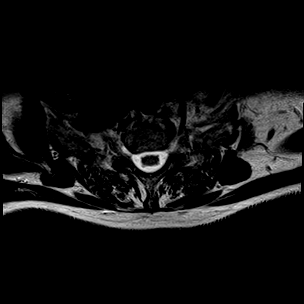
[im 12/40]
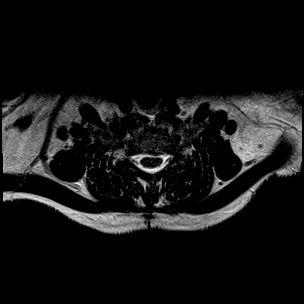
[im 17/40]
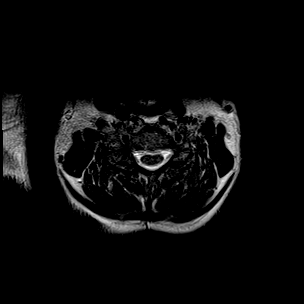
[im 20/40]
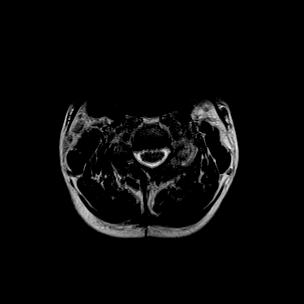
[im 23/40]
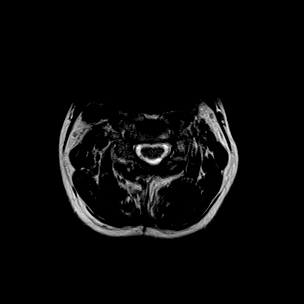
[im 28/40]
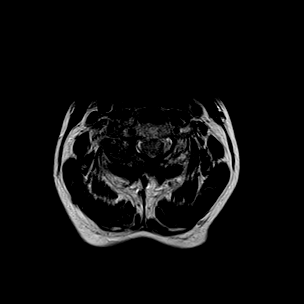
[im 34/40]
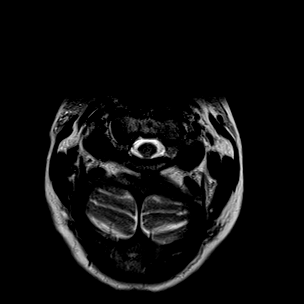
[im 40/40]
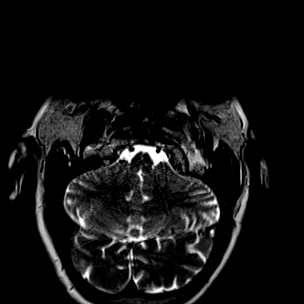

[Series 9: GRE · axial · 3.0mm · 0.39mm/px · z∈[-166,-58]mm · 8 of 40 slices shown]
[im 1/40]
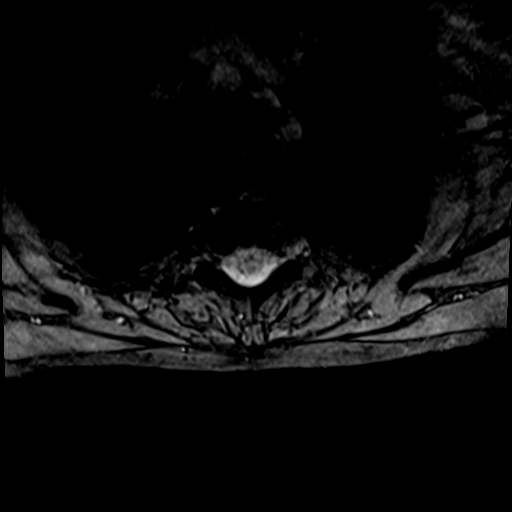
[im 6/40]
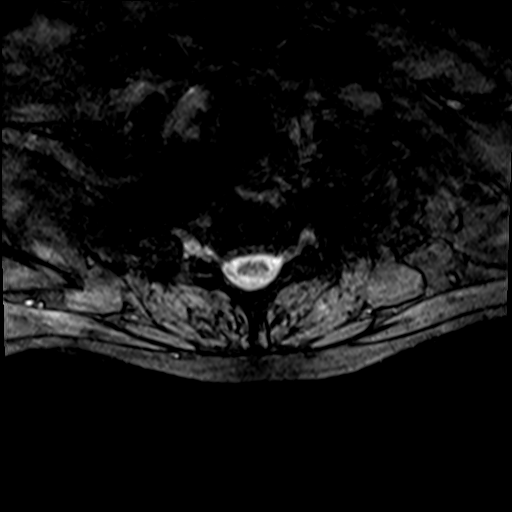
[im 12/40]
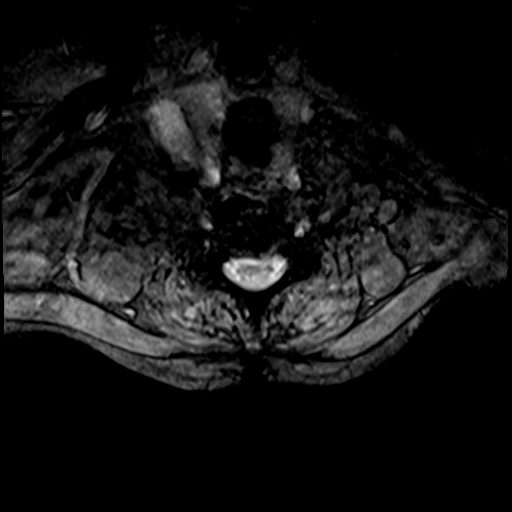
[im 17/40]
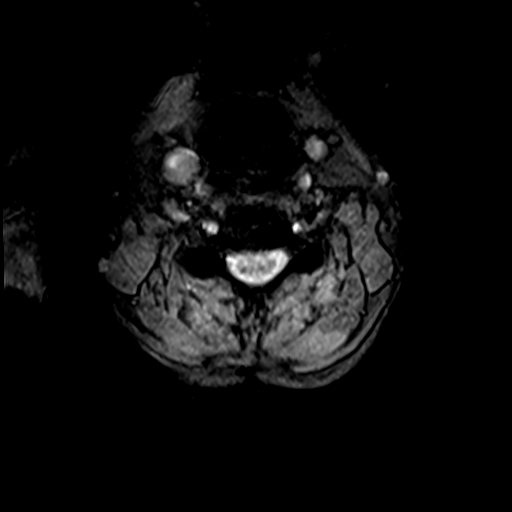
[im 23/40]
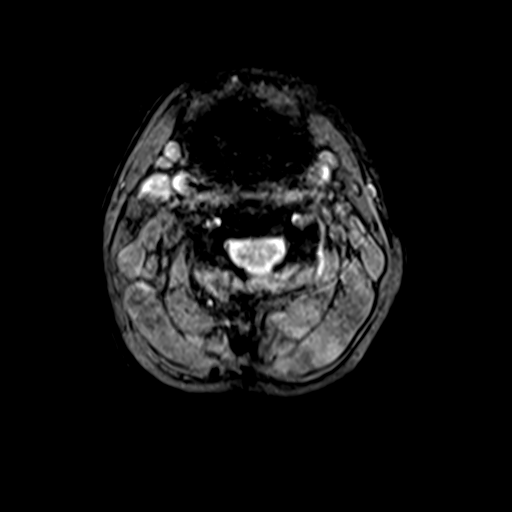
[im 28/40]
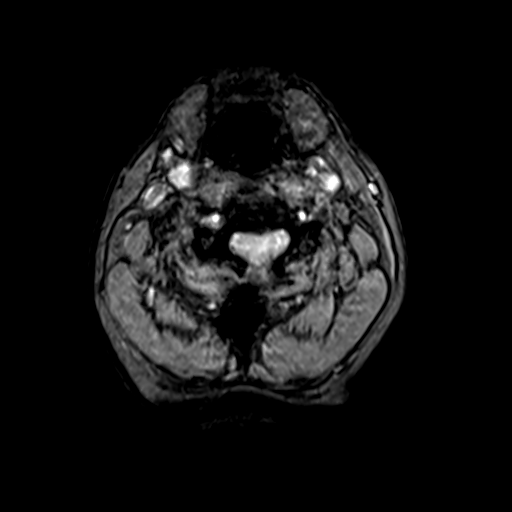
[im 34/40]
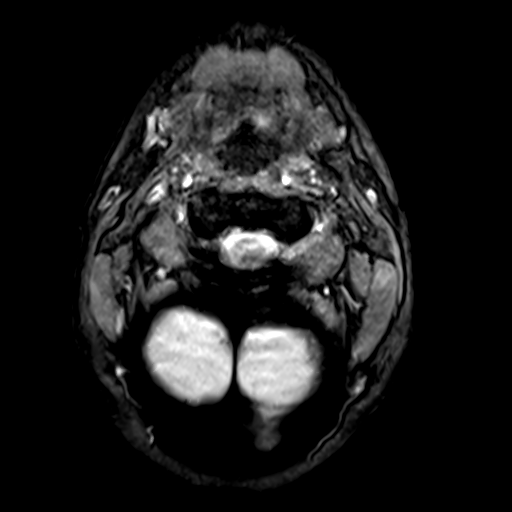
[im 40/40]
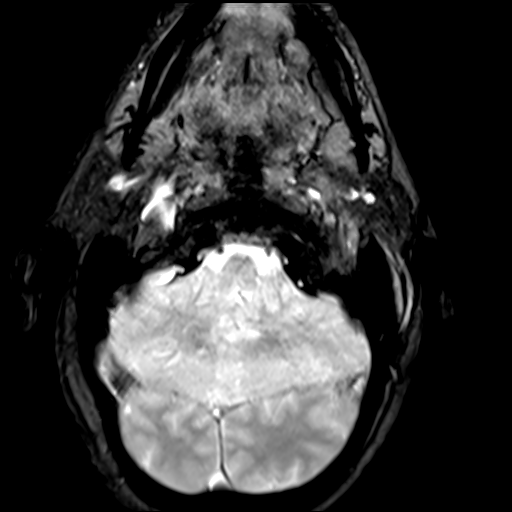

[35 of 48 positions shown; findings below may reference images not displayed]

FINDINGS: Alignment: New 3 mm anterolisthesis of C2 on C3. Reversal of the
normal cervical lordosis.

Vertebrae: Discogenic endplate marrow changes at C2-3. There is also
a reactive marrow edema centered at the bilateral C2-3 facet joints.
No fracture, evidence of discitis, or suspicious bone lesion.

Cord: Redemonstrated T2/STIR hyperintense signal within the cord at
the C2-3 level, improved compared to prior. No syrinx. No new cord
signal abnormality.

Posterior Fossa, vertebral arteries, paraspinal tissues:
Postoperative changes within the posterior paraspinal soft tissues.
No postoperative fluid collection. No acute findings within the
visualized posterior fossa. Vertebral artery flow voids are intact.

Disc levels:

C2-C3: Interval posterior decompression. New grade 1 anterolisthesis
with focal disc protrusion, slightly eccentric to the right (series
8, image 11). The degree of canal stenosis has improved compared to
the previous MRI, although is still severe. At least moderate
bilateral foraminal stenosis, right worse than left.

C3-C4: Interval posterior decompression. Minimal disc bulge. Minimal
or mild residual canal stenosis, improved from prior. Mild bilateral
foraminal stenosis, also improved.

C4-C5: Minimal disc osteophyte complex with right greater than left
uncovertebral arthropathy resulting in mild-to-moderate foraminal
stenosis, right worse than left. No significant canal stenosis.
Unchanged.

C5-C6: Small left paracentral disc protrusion contacting the left
hemicord resulting in mild canal stenosis. Mild-to-moderate
right-sided foraminal stenosis. Unchanged.

C6-C7: Mild disc osteophyte complex without significant foraminal or
canal stenosis. Unchanged.

C7-T1: Minimal disc osteophyte complex without significant foraminal
or canal stenosis. Unchanged.
IMPRESSION: 1. Interval posterior decompression at C2-3 and C3-4.
2. New grade 1 anterolisthesis of C2 on C3 with focal disc
protrusion. The degree of canal stenosis has improved compared to
the previous MRI, although is still severe. At least moderate
bilateral foraminal stenosis, right worse than left.
3. Redemonstrated increased signal within the cord at the C2-3 level
compatible with compressive myelopathy, slightly improved compared
to prior.
4. Stable mild canal stenosis at C5-6 with mild-to-moderate
right-sided foraminal stenosis.

## 2022-07-27 DIAGNOSIS — R0902 Hypoxemia: Secondary | ICD-10-CM | POA: Diagnosis not present

## 2022-08-05 NOTE — Progress Notes (Signed)
GI Office Note    Referring Provider: Dettinger, Fransisca Kaufmann, MD Primary Care Physician:  Dettinger, Fransisca Kaufmann, MD Primary Gastroenterologist: Cristopher Estimable.Rourk, MD  Date:  08/06/2022  ID:  Margaret Vaughn, DOB [birth year], MRN [id number]   Chief Complaint   Chief Complaint  Patient presents with   Follow-up    Still having problems with stomach and getting sick. Has heart burn.    History of Present Illness  Margaret Vaughn is a 62 y.o. female with a history of GERD, constipation, asthma, COPD (2-4L [facility] at night), anemia, chronic pain, memory loss, anxiety depression, diabetes, tobacco abuse, HTN, paroxysmal SVT, HLD, syringomyelia presenting today for follow up post procedures.   Last office visit 01/08/2022.  Currently nausea with active appetite and slightly satiety.  Intermittent sour taste in mouth.  Losing weight due to mobility issue prior to  spinal surgery.  Weight stable since surgery.  Stopped taking Nexium due to her medications.  Nausea 2-3 times per week with intermittent vomiting.  Small amounts of stool with need to strain.  Also associated with abdominal pain.  No melena or hematochezia.  Occasional hemorrhoid symptoms.  Tried stool softeners and Dulcolax not helpful.  Chronically on OxyContin for about 10 years.  Currently possible gastroparesis in the setting of chronic opioid use and diabetes.  Started on Nexium 40 mg daily.  Scheduled for EGD and colonoscopy.  Amitiza 24 mcg twice daily advised.  Consider GES if EGD negative PPI therapy ineffective.  Labs 06/26/22: A1c 5.7, BMP normal,   EGD 07/01/22: -Grade B esophagitis with no bleeding -Esophageal dilation -Inflamed gastric mucosa s/p biopsy -Normal duodenum -Stomach biopsy with nonspecific reactive gastropathy, negative H. Pylori -Continue current PPI therapy  Colonoscopy 07/01/22: -8 mm rectosigmoid polyp removed with hot snare s/p 1 clip -Cecal polyp resected s/p 1 clip -Cecal polyp revealed as colonic neuroma  (benign spindle cell proliferation arranged in short bundles.  The cells are positive for S100 and negative for CD68) -Rectosigmoid polyp, serrated with features of sessile serrated, negative for dysplasia -Repeat colonoscopy in 3 years  Treated with mupirocin for 5 days for MRSA colonization.  Today: GERD, nausea: Had some heartburn right away this morning. Has not eaten anything. Had to chew Tums this morning. States she has not been taking Nexium. Gets nauseas with it and has had a couple episodes of vomiting related to epigastric/LUQ pain. Sometimes milk helps, sometimes it does not. Emesis is golden/yellow color.   Constipation: If she is able to eat a decent meal she is able to go every night or every other night. States amitiza threw her into stomach cramps. Will take Amitiza if not had a bm in 2 days. Has been trying to drink extra water. Used to take miralax and now unable to drink it due to taste. Will only use a stool softener if needed. Still taking opioids.   Has been having issues with intermittent pruritus. Stopped when doing hormone replacement. States GI helped take care of this in the past. States this was in the 39s. She would only take it has needed. Benadryl and lotion not helpful. Has caused wounds before due to itching.   Still smoking, about pack per day.   Current Outpatient Medications  Medication Sig Dispense Refill   albuterol (VENTOLIN HFA) 108 (90 Base) MCG/ACT inhaler INHALE 2 PUFFS BY MOUTH EVERY 6 HOURS AS NEEDED FOR WHEEZE OR SHORTNESS OF BREATH 3 each 4   aspirin EC 81 MG tablet Take 81 mg  by mouth daily. Swallow whole.     atorvastatin (LIPITOR) 40 MG tablet Take 1 tablet (40 mg total) by mouth daily. 90 tablet 3   baclofen (LIORESAL) 10 MG tablet Take 10 mg by mouth 3 (three) times daily as needed for muscle spasms.     budesonide-formoterol (SYMBICORT) 160-4.5 MCG/ACT inhaler Inhale 2 puffs into the lungs 2 (two) times daily. 1 each 12   cholecalciferol  (VITAMIN D3) 25 MCG (1000 UNIT) tablet Take 1,000 Units by mouth daily.     Cyanocobalamin (B-12 PO) Take 1 tablet by mouth daily.     cyclobenzaprine (FLEXERIL) 10 MG tablet Take 1 tablet (10 mg total) by mouth at bedtime as needed for muscle spasms. 90 tablet 1   DULoxetine (CYMBALTA) 60 MG capsule Take 1 capsule (60 mg total) by mouth daily. TAKE 1 CAPSULE BY MOUTH EVERY DAY 90 capsule 3   enalapril (VASOTEC) 5 MG tablet Take 1 tablet (5 mg total) by mouth daily. 90 tablet 3   esomeprazole (NEXIUM) 40 MG capsule Take 1 capsule (40 mg total) by mouth daily. 90 capsule 3   ipratropium-albuterol (DUONEB) 0.5-2.5 (3) MG/3ML SOLN Take 3 mLs by nebulization every 6 (six) hours as needed. 360 mL 11   lubiprostone (AMITIZA) 24 MCG capsule Take 1 capsule (24 mcg total) by mouth 2 (two) times daily with a meal. (Patient taking differently: Take 24 mcg by mouth daily as needed for constipation.) 60 capsule 3   Multiple Vitamins-Minerals (HAIR SKIN NAILS PO) Take 3 tablets by mouth daily.     Multiple Vitamins-Minerals (MULTIVITAMIN WITH MINERALS) tablet Take 1 tablet by mouth daily.     mupirocin ointment (BACTROBAN) 2 % Apply to both nares bid for 5 days 22 g 0   nicotine (NICODERM CQ - DOSED IN MG/24 HOURS) 21 mg/24hr patch Place 21 mg onto the skin daily as needed (smoking cessation).     nitrofurantoin (MACRODANTIN) 100 MG capsule Take 100 mg by mouth daily.     oxyCODONE (OXYCONTIN) 10 mg 12 hr tablet Take 1 tablet (10 mg total) by mouth 2 (two) times daily. 14 tablet 0   pregabalin (LYRICA) 150 MG capsule Take 1 capsule (150 mg total) by mouth 3 (three) times daily. 90 capsule 2   ramelteon (ROZEREM) 8 MG tablet TAKE 1 TABLET BY MOUTH AT BEDTIME. 30 tablet 3   ramelteon (ROZEREM) 8 MG tablet TAKE 1 TABLET BY MOUTH AT BEDTIME. 90 tablet 2   sitaGLIPtin-metformin (JANUMET) 50-1000 MG tablet TAKE 1 TABLET BY MOUTH TWICE A DAY WITH A MEAL 180 tablet 3   Tiotropium Bromide Monohydrate (SPIRIVA RESPIMAT)  2.5 MCG/ACT AERS Inhale 2 puffs into the lungs daily. 1 each 11   XTAMPZA ER 13.5 MG C12A Take 13.5 mg by mouth 2 (two) times daily.     No current facility-administered medications for this visit.    Past Medical History:  Diagnosis Date   Acid reflux    Allergies    Anemia    Asthma    Bronchitis    Cervical stenosis of spine    Chronic pain syndrome    Complication of anesthesia    difficulty waking up from anesthesia   COPD (chronic obstructive pulmonary disease) (HCC)    DDD (degenerative disc disease), lumbar    Depression with anxiety    Diabetes mellitus without complication ([place])    Dyspnea    Dysrhythmia    PSVT   Fatigue    H/O blood clots  H/O echocardiogram 1989   Per pt, done @ [facility] mitral valve prolapse   Headache(784.0)    Heart murmur    Hemorrhoids, external    History of hiatal hernia    History of kidney stones    History of PSVT (paroxysmal supraventricular tachycardia)    HTN (hypertension)    Hyperlipidemia    Hypoxemia    Insomnia    Memory loss    Myalgia and myositis    Narcolepsy    On home O2    2-4 L @ HS   OSA (obstructive sleep apnea)    Pneumonia    Sleeping difficulty    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[place])    Thoracalgia    Tobacco abuse    Tremor    Ulcer disease     Past Surgical History:  Procedure Laterality Date   ABDOMINAL HYSTERECTOMY     ANTERIOR CERVICAL DECOMP/DISCECTOMY FUSION N/A 12/18/2020   Procedure: CERVICAL TWO-THREE ANTERIOR CERVICAL DISCECTOMY/DECOMPRESSION FUSION;  Surgeon: Kary Kos, MD;  Location: [place];  Service: Neurosurgery;  Laterality: N/A;   APPENDECTOMY  1992   BIOPSY  07/01/2022   Procedure: BIOPSY;  Surgeon: Daneil Dolin, MD;  [facility];  Service: Endoscopy;;   CHOLECYSTECTOMY  1992   COLONOSCOPY WITH PROPOFOL N/A 07/01/2022   Procedure: COLONOSCOPY WITH PROPOFOL;  Surgeon: Daneil Dolin, MD;  [facility];  Service: Endoscopy;  Laterality: N/A;  12:00 pm    DIAGNOSTIC LAPAROSCOPY  1992   lap chole   ESOPHAGOGASTRODUODENOSCOPY (EGD) WITH PROPOFOL N/A 07/01/2022   Procedure: ESOPHAGOGASTRODUODENOSCOPY (EGD) WITH PROPOFOL;  Surgeon: Daneil Dolin, MD;  [facility];  Service: Endoscopy;  Laterality: N/A;   HEMOSTASIS CLIP PLACEMENT  07/01/2022   Procedure: HEMOSTASIS CLIP PLACEMENT;  Surgeon: Daneil Dolin, MD;  [facility];  Service: Endoscopy;;   HERNIA REPAIR     umbilical... as a child   MALONEY DILATION  07/01/2022   Procedure: MALONEY DILATION;  Surgeon: Daneil Dolin, MD;  [facility];  Service: Endoscopy;;   OOPHORECTOMY     POLYPECTOMY  07/01/2022   Procedure: POLYPECTOMY;  Surgeon: Daneil Dolin, MD;  [facility];  Service: Endoscopy;;   POSTERIOR CERVICAL FUSION/FORAMINOTOMY N/A 08/07/2020   Procedure: POSTERIOR CERVICAL LAMINECTOMY FOR MYELOPATHY;  Surgeon: Kary Kos, MD;  Location: [place];  Service: Neurosurgery;  Laterality: N/A;   POSTERIOR CERVICAL FUSION/FORAMINOTOMY N/A 03/10/2021   Procedure: Posterior cervical fusion with lateral mass fixation Cervical one - cervical four;  Surgeon: Kary Kos, MD;  Location: [place];  Service: Neurosurgery;  Laterality: N/A;   POSTERIOR CERVICAL LAMINECTOMY  08/07/2020   w/ Dr. Saintclair Halsted   tonsillectomy     TONSILLECTOMY     TUBAL LIGATION      Family History  Problem Relation Age of Onset   Ulcers Mother    Sleep apnea Mother    Migraines Mother    Cancer Mother        uterine   Arthritis Mother    Hypertension Mother    Heart disease Mother    Depression Mother    Hyperlipidemia Mother    Colon cancer Mother    Arrhythmia Son     Allergies as of 08/06/2022 - Review Complete 08/06/2022  Allergen Reaction Noted   Avelox [moxifloxacin] Anaphylaxis    Iohexol Anaphylaxis 06/11/2004   Shellfish allergy Hives 08/10/2012    Social History   Socioeconomic History   Marital  status: Married    Spouse name: Not on file   Number of  children: 5   Years of education: Not on file   Highest education level: Not on file  Occupational History   Occupation: disabled    Employer: UNEMPLOYED  Tobacco Use   Smoking status: Every Day    Packs/day: 2.00    Years: 30.00    Total pack years: 60.00    Types: Cigarettes   Smokeless tobacco: Never   Tobacco comments:    Pt states she is down to 1 ppd. 06/22/2022   Vaping Use   Vaping Use: Never used  Substance and Sexual Activity   Alcohol use: No   Drug use: No   Sexual activity: Yes    Birth control/protection: Surgical  Other Topics Concern   Not on file  Social History Narrative   Not on file   Social Determinants of Health   Financial Resource Strain: Not on file  Food Insecurity: Not on file  Transportation Needs: Not on file  Physical Activity: Not on file  Stress: Not on file  Social Connections: Not on file     Review of Systems   Gen: Denies fever, chills, anorexia. Denies fatigue, weakness, weight loss.  CV: Denies chest pain, palpitations, syncope, peripheral edema, and claudication. Resp: Denies dyspnea at rest, cough, wheezing, coughing up blood, and pleurisy. GI: See HPI Derm: Denies rash, itching, dry skin Psych: Denies depression, anxiety, memory loss, confusion. No homicidal or suicidal ideation.  Heme: Denies bruising, bleeding, and enlarged lymph nodes.   Physical Exam   BP (!) 140/80 (BP Location: Left Arm, Patient Position: Sitting, Cuff Size: Normal)   Pulse 99   Temp 97.7 F (36.5 C) (Temporal)   Ht '5\' 2"'$  (1.575 m)   Wt 139 lb (63 kg)   SpO2 93%   BMI 25.42 kg/m   General:   Alert and oriented. No distress noted. Pleasant and cooperative.  Head:  Normocephalic and atraumatic. Eyes:  Conjuctiva clear without scleral icterus. Mouth:  Oral mucosa pink and moist. Good dentition. No lesions. Lungs:  Clear to auscultation bilaterally. No wheezes, rales, or rhonchi. No distress.  Heart:  S1, S2 present without murmurs  appreciated.  Abdomen:  +BS, soft, non-distended. Mild ttp to epigastrium. No rebound or guarding. No HSM or masses noted. Rectal: deferred Msk:  Symmetrical without gross deformities. Normal posture. Extremities:  Without edema. Neurologic:  Alert and  oriented x4 Psych:  Alert and cooperative. Normal mood and affect.   Assessment  Margaret Vaughn is a 62 y.o. female with a history of GERD, constipation, asthma, COPD (2-4L [place] at night), anemia, chronic pain, memory loss, anxiety, depression, diabetes, tobacco abuse, HTN, paroxysmal SVT, HLD, syringomyelia presenting today for follow up post procedures.   GERD, nausea: EGD in early December with esophagitis and gastritis with biopsies negative for H. pylori.  Patient advised to continue Nexium at previous visit in June.  Patient states today she is not currently taking Nexium on a daily basis and having more frequent heartburn.  Actually experience it today and had to take Tums.  We discussed at length today how PPI works and why it is needed to control her heartburn symptoms.  We discussed that her nausea could also be likely secondary to uncontrolled GERD.  We may increase to twice daily if needed however she is to follow GERD diet and take medication daily for now.  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  Patient states she has been staying more hydrated recently and is having a bowel movement almost every day, if not every other day without much difficulty.  States she did try Amitiza however was experiencing frequent loose stools with it so she is only using as needed.  We discussed that if symptoms worsen she may go back to taking daily or increasing to twice daily if needed but to take with food to prevent nausea.  She is in agreement and she will reach out if symptoms worsen.  Pruritus: Intermittent. Has had a recent bout in which she makes wounds on her skin.   Symptoms have been unrelieved with Benadryl in the past as well as over-the-counter lotions and creams.  Will send in short course of hydroxyzine and we discussed that I would not be refilling the medication and that she needs to discuss this with her PCP.  History of colon polyps: Hyperplastic polyp on colonoscopy in 2011.  Recent colonoscopy with benign cecal neuroma and 8 mm rectosigmoid sessile serrated polyp.  Advised repeat TCS in 3 years.  The patient was found to have elevated blood pressure when vital signs were checked in the office. The blood pressure was rechecked by the nursing staff and it was found be persistently elevated >140/90 mmHg. I personally advised to the patient to follow up closely with PCP for hypertension control.  PLAN   Encouraged to take Nexium daily, 30 minutes prior to breakfast.  Continue Amitiza 24 mcg as needed. GERD diet/lifestyle modifications.  Small course of hydroxyzine 5 mg twice daily as needed Talk to Dr. Warrick Parisian (PCP) about pruritus and BP Follow up in 4 months.     Venetia Night, MSN, FNP-BC, AGACNP-BC [facility]

## 2022-08-06 ENCOUNTER — Ambulatory Visit: Payer: Medicaid Other | Admitting: Gastroenterology

## 2022-08-06 ENCOUNTER — Encounter: Payer: Self-pay | Admitting: Gastroenterology

## 2022-08-06 VITALS — BP 140/80 | HR 99 | Temp 97.7°F | Ht 62.0 in | Wt 139.0 lb

## 2022-08-06 DIAGNOSIS — K5903 Drug induced constipation: Secondary | ICD-10-CM | POA: Diagnosis not present

## 2022-08-06 DIAGNOSIS — Z86010 Personal history of colonic polyps: Secondary | ICD-10-CM | POA: Diagnosis not present

## 2022-08-06 DIAGNOSIS — K2100 Gastro-esophageal reflux disease with esophagitis, without bleeding: Secondary | ICD-10-CM

## 2022-08-06 DIAGNOSIS — R110 Nausea: Secondary | ICD-10-CM | POA: Diagnosis not present

## 2022-08-06 DIAGNOSIS — L299 Pruritus, unspecified: Secondary | ICD-10-CM | POA: Diagnosis not present

## 2022-08-06 MED ORDER — HYDROXYZINE HCL 10 MG PO TABS: 5.0000 mg | ORAL_TABLET | Freq: Two times a day (BID) | ORAL | 0 refills | Status: DC | PRN

## 2022-08-06 NOTE — Patient Instructions (Addendum)
Please make sure you are taking esomeprazole (Nexium) 40 mg, 30 minutes prior to breakfast.  Your upper endoscopy showed inflammation in your esophagus as well as your stomach which is likely the cause of your nausea, upper abdominal pain, and burning/heartburn that you have.  This medication is needed to treat the symptoms.  Start with daily dosing, if needed we can consider increasing it to twice a day.  Cutting back on tobacco use could also help with your reflux.  Follow a GERD diet:  Avoid fried, fatty, greasy, spicy, citrus foods. Avoid caffeine and carbonated beverages. Avoid chocolate. Try eating 4-6 small meals a day rather than 3 large meals. Do not eat within 3 hours of laying down. Prop head of bed up on wood or bricks to create a 6 inch incline.   For constipation please continue to stay hydrated as you are.  You may continue to take Amitiza 24 mcg as needed.  If your symptoms worsen, you may start taking Amitiza 24 mcg daily with food.  It can be taken up to twice daily.  For your itching I am providing you with a very short course of hydroxyzine.  You will take 1/2 tablet (5 mg) up to twice a day as needed.  This can cause sedating effects therefore you will need to stay at home and not operate any equipment or drive a vehicle.  For any refills you will need to discuss with your primary care provider Dr. Warrick Parisian.  Will plan to follow-up in 4 months, or sooner if needed.  Please reach out if your heartburn is uncontrolled or you have worsening trouble with constipation despite above recommendations.  It was a pleasure to see you today. I want to create trusting relationships with patients. If you receive a survey regarding your visit,  I greatly appreciate you taking time to fill this out on paper or through your MyChart. I value your feedback.  Venetia Night, MSN, FNP-BC, AGACNP-BC [facility]

## 2022-08-07 DIAGNOSIS — G950 Syringomyelia and syringobulbia: Secondary | ICD-10-CM | POA: Diagnosis not present

## 2022-08-07 DIAGNOSIS — J449 Chronic obstructive pulmonary disease, unspecified: Secondary | ICD-10-CM | POA: Diagnosis not present

## 2022-08-07 DIAGNOSIS — F17200 Nicotine dependence, unspecified, uncomplicated: Secondary | ICD-10-CM | POA: Diagnosis not present

## 2022-08-07 DIAGNOSIS — R110 Nausea: Secondary | ICD-10-CM | POA: Diagnosis not present

## 2022-08-07 DIAGNOSIS — R030 Elevated blood-pressure reading, without diagnosis of hypertension: Secondary | ICD-10-CM | POA: Diagnosis not present

## 2022-08-07 DIAGNOSIS — Z6823 Body mass index (BMI) 23.0-23.9, adult: Secondary | ICD-10-CM | POA: Diagnosis not present

## 2022-08-07 DIAGNOSIS — F17210 Nicotine dependence, cigarettes, uncomplicated: Secondary | ICD-10-CM | POA: Diagnosis not present

## 2022-08-07 DIAGNOSIS — G894 Chronic pain syndrome: Secondary | ICD-10-CM | POA: Diagnosis not present

## 2022-08-07 DIAGNOSIS — Z79899 Other long term (current) drug therapy: Secondary | ICD-10-CM | POA: Diagnosis not present

## 2022-08-07 DIAGNOSIS — M4802 Spinal stenosis, cervical region: Secondary | ICD-10-CM | POA: Diagnosis not present

## 2022-08-07 DIAGNOSIS — E119 Type 2 diabetes mellitus without complications: Secondary | ICD-10-CM | POA: Diagnosis not present

## 2022-08-11 DIAGNOSIS — Z79899 Other long term (current) drug therapy: Secondary | ICD-10-CM | POA: Diagnosis not present

## 2022-08-21 DIAGNOSIS — J9601 Acute respiratory failure with hypoxia: Secondary | ICD-10-CM | POA: Diagnosis not present

## 2022-08-21 DIAGNOSIS — J449 Chronic obstructive pulmonary disease, unspecified: Secondary | ICD-10-CM | POA: Diagnosis not present

## 2022-08-27 DIAGNOSIS — R0902 Hypoxemia: Secondary | ICD-10-CM | POA: Diagnosis not present

## 2022-08-30 DIAGNOSIS — M79642 Pain in left hand: Secondary | ICD-10-CM | POA: Diagnosis not present

## 2022-09-04 DIAGNOSIS — J449 Chronic obstructive pulmonary disease, unspecified: Secondary | ICD-10-CM | POA: Diagnosis not present

## 2022-09-04 DIAGNOSIS — G950 Syringomyelia and syringobulbia: Secondary | ICD-10-CM | POA: Diagnosis not present

## 2022-09-04 DIAGNOSIS — F17200 Nicotine dependence, unspecified, uncomplicated: Secondary | ICD-10-CM | POA: Diagnosis not present

## 2022-09-04 DIAGNOSIS — R030 Elevated blood-pressure reading, without diagnosis of hypertension: Secondary | ICD-10-CM | POA: Diagnosis not present

## 2022-09-04 DIAGNOSIS — F17210 Nicotine dependence, cigarettes, uncomplicated: Secondary | ICD-10-CM | POA: Diagnosis not present

## 2022-09-04 DIAGNOSIS — G894 Chronic pain syndrome: Secondary | ICD-10-CM | POA: Diagnosis not present

## 2022-09-04 DIAGNOSIS — Z79899 Other long term (current) drug therapy: Secondary | ICD-10-CM | POA: Diagnosis not present

## 2022-09-04 DIAGNOSIS — E119 Type 2 diabetes mellitus without complications: Secondary | ICD-10-CM | POA: Diagnosis not present

## 2022-09-04 DIAGNOSIS — R110 Nausea: Secondary | ICD-10-CM | POA: Diagnosis not present

## 2022-09-04 DIAGNOSIS — Z6823 Body mass index (BMI) 23.0-23.9, adult: Secondary | ICD-10-CM | POA: Diagnosis not present

## 2022-09-04 DIAGNOSIS — M4802 Spinal stenosis, cervical region: Secondary | ICD-10-CM | POA: Diagnosis not present

## 2022-09-05 ENCOUNTER — Other Ambulatory Visit: Payer: Self-pay | Admitting: Pulmonary Disease

## 2022-09-05 ENCOUNTER — Other Ambulatory Visit: Payer: Self-pay | Admitting: Gastroenterology

## 2022-09-05 DIAGNOSIS — K5903 Drug induced constipation: Secondary | ICD-10-CM

## 2022-09-05 DIAGNOSIS — L299 Pruritus, unspecified: Secondary | ICD-10-CM

## 2022-09-10 ENCOUNTER — Ambulatory Visit (INDEPENDENT_AMBULATORY_CARE_PROVIDER_SITE_OTHER): Payer: Medicaid Other | Admitting: Family Medicine

## 2022-09-10 ENCOUNTER — Encounter: Payer: Self-pay | Admitting: Family Medicine

## 2022-09-10 ENCOUNTER — Other Ambulatory Visit (HOSPITAL_COMMUNITY)
Admission: RE | Admit: 2022-09-10 | Discharge: 2022-09-10 | Disposition: A | Discharge status: Home / Self Care | Payer: Medicaid Other | Source: Ambulatory Visit | Attending: Family Medicine | Admitting: Family Medicine

## 2022-09-10 VITALS — BP 124/72 | HR 96 | Temp 96.7°F | Ht 62.0 in | Wt 139.2 lb

## 2022-09-10 DIAGNOSIS — Z01419 Encounter for gynecological examination (general) (routine) without abnormal findings: Secondary | ICD-10-CM | POA: Insufficient documentation

## 2022-09-10 DIAGNOSIS — Z1231 Encounter for screening mammogram for malignant neoplasm of breast: Secondary | ICD-10-CM

## 2022-09-10 DIAGNOSIS — Z124 Encounter for screening for malignant neoplasm of cervix: Secondary | ICD-10-CM

## 2022-09-10 DIAGNOSIS — R87629 Unspecified abnormal cytological findings in specimens from vagina: Secondary | ICD-10-CM

## 2022-09-10 DIAGNOSIS — Z01411 Encounter for gynecological examination (general) (routine) with abnormal findings: Secondary | ICD-10-CM | POA: Diagnosis not present

## 2022-09-10 NOTE — Progress Notes (Signed)
Subjective:  Patient ID: Margaret Vaughn, female    DOB: 02/13/1961, 62 y.o.   MRN: [id number]  Patient Care Team: Dettinger, Fransisca Kaufmann, MD as PCP - General (Family Medicine)   Chief Complaint:  Gynecologic Exam   HPI: Margaret Vaughn is a 62 y.o. female presenting on 09/10/2022 for Gynecologic Exam   Pt presents today for PAP. She is followed by her PCP on a regular basis and has all other health maintenance completed with them. Has had a total hysterectomy.   Gynecologic Exam The patient's pertinent negatives include no genital itching, genital lesions, genital odor, genital rash, pelvic pain, vaginal bleeding or vaginal discharge. The patient is experiencing no pain. Pertinent negatives include no abdominal pain, anorexia, back pain, chills, constipation, diarrhea, discolored urine, dysuria, fever, flank pain, frequency, headaches, hematuria, joint pain, joint swelling, nausea, rash, sore throat, urgency or vomiting.     Relevant past medical, surgical, family, and social history reviewed and updated as indicated.  Allergies and medications reviewed and updated. Data reviewed: Chart in Epic.   Past Medical History:  Diagnosis Date   Acid reflux    Allergies    Anemia    Asthma    Bronchitis    Cervical stenosis of spine    Chronic pain syndrome    Complication of anesthesia    difficulty waking up from anesthesia   COPD (chronic obstructive pulmonary disease) ([place])    DDD (degenerative disc disease), lumbar    Depression with anxiety    Diabetes mellitus without complication ([place])    Dyspnea    Dysrhythmia    PSVT   Fatigue    H/O blood clots    H/O echocardiogram 1989   Per pt, done @ [facility] mitral valve prolapse   Headache(784.0)    Heart murmur    Hemorrhoids, external    History of hiatal hernia    History of kidney stones    History of PSVT (paroxysmal supraventricular tachycardia)    HTN (hypertension)    Hyperlipidemia     Hypoxemia    Insomnia    Memory loss    Myalgia and myositis    Narcolepsy    On home O2    2-4 L @ HS   OSA (obstructive sleep apnea)    Pneumonia    Sleeping difficulty    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[place])    Thoracalgia    Tobacco abuse    Tremor    Ulcer disease     Past Surgical History:  Procedure Laterality Date   ABDOMINAL HYSTERECTOMY     ANTERIOR CERVICAL DECOMP/DISCECTOMY FUSION N/A 12/18/2020   Procedure: CERVICAL TWO-THREE ANTERIOR CERVICAL DISCECTOMY/DECOMPRESSION FUSION;  Surgeon: Kary Kos, MD;  Location: [facility];  Service: Neurosurgery;  Laterality: N/A;   APPENDECTOMY  1992   BIOPSY  07/01/2022   Procedure: BIOPSY;  Surgeon: Daneil Dolin, MD;  [facility];  Service: Endoscopy;;   CHOLECYSTECTOMY  1992   COLONOSCOPY WITH PROPOFOL N/A 07/01/2022   Procedure: COLONOSCOPY WITH PROPOFOL;  Surgeon: Daneil Dolin, MD;  [facility];  Service: Endoscopy;  Laterality: N/A;  12:00 pm   DIAGNOSTIC LAPAROSCOPY  1992   lap chole   ESOPHAGOGASTRODUODENOSCOPY (EGD) WITH PROPOFOL N/A 07/01/2022   Procedure: ESOPHAGOGASTRODUODENOSCOPY (EGD) WITH PROPOFOL;  Surgeon: Daneil Dolin, MD;  [facility];  Service: Endoscopy;  Laterality: N/A;   HEMOSTASIS CLIP PLACEMENT  07/01/2022   Procedure: HEMOSTASIS CLIP PLACEMENT;  Surgeon:  Rourk, Cristopher Estimable, MD;  [facility];  Service: Endoscopy;;   HERNIA REPAIR     umbilical... as a child   MALONEY DILATION  07/01/2022   Procedure: MALONEY DILATION;  Surgeon: Daneil Dolin, MD;  [facility];  Service: Endoscopy;;   OOPHORECTOMY     POLYPECTOMY  07/01/2022   Procedure: POLYPECTOMY;  Surgeon: Daneil Dolin, MD;  [facility];  Service: Endoscopy;;   POSTERIOR CERVICAL FUSION/FORAMINOTOMY N/A 08/07/2020   Procedure: POSTERIOR CERVICAL LAMINECTOMY FOR MYELOPATHY;  Surgeon: Kary Kos, MD;  Location: [facility];  Service: Neurosurgery;  Laterality: N/A;   POSTERIOR CERVICAL  FUSION/FORAMINOTOMY N/A 03/10/2021   Procedure: Posterior cervical fusion with lateral mass fixation Cervical one - cervical four;  Surgeon: Kary Kos, MD;  Location: [facility];  Service: Neurosurgery;  Laterality: N/A;   POSTERIOR CERVICAL LAMINECTOMY  08/07/2020   w/ Dr. Saintclair Halsted   tonsillectomy     TONSILLECTOMY     TUBAL LIGATION      Social History   Socioeconomic History   Marital status: Married    Spouse name: Not on file   Number of children: 5   Years of education: Not on file   Highest education level: Not on file  Occupational History   Occupation: disabled    Employer: UNEMPLOYED  Tobacco Use   Smoking status: Every Day    Packs/day: 2.00    Years: 30.00    Total pack years: 60.00    Types: Cigarettes   Smokeless tobacco: Never   Tobacco comments:    Pt states she is down to 1 ppd. 06/22/2022   Vaping Use   Vaping Use: Never used  Substance and Sexual Activity   Alcohol use: No   Drug use: No   Sexual activity: Yes    Birth control/protection: Surgical  Other Topics Concern   Not on file  Social History Narrative   Not on file   Social Determinants of Health   Financial Resource Strain: Not on file  Food Insecurity: Not on file  Transportation Needs: Not on file  Physical Activity: Not on file  Stress: Not on file  Social Connections: Not on file  Intimate Partner Violence: Not on file    Outpatient Encounter Medications as of 09/10/2022  Medication Sig   albuterol (VENTOLIN HFA) 108 (90 Base) MCG/ACT inhaler INHALE 2 PUFFS BY MOUTH EVERY 6 HOURS AS NEEDED FOR WHEEZE OR SHORTNESS OF BREATH   AMITIZA 24 MCG capsule TAKE 1 CAPSULE (24 MCG TOTAL) BY MOUTH 2 (TWO) TIMES DAILY WITH A MEAL.   aspirin EC 81 MG tablet Take 81 mg by mouth daily. Swallow whole.   atorvastatin (LIPITOR) 40 MG tablet Take 1 tablet (40 mg total) by mouth daily.   baclofen (LIORESAL) 10 MG tablet Take 10 mg by mouth 3 (three) times daily as needed for muscle spasms.    budesonide-formoterol (SYMBICORT) 160-4.5 MCG/ACT inhaler Inhale 2 puffs into the lungs 2 (two) times daily.   cholecalciferol (VITAMIN D3) 25 MCG (1000 UNIT) tablet Take 1,000 Units by mouth daily.   Cyanocobalamin (B-12 PO) Take 1 tablet by mouth daily.   cyclobenzaprine (FLEXERIL) 10 MG tablet Take 1 tablet (10 mg total) by mouth at bedtime as needed for muscle spasms.   DULoxetine (CYMBALTA) 60 MG capsule Take 1 capsule (60 mg total) by mouth daily. TAKE 1 CAPSULE BY MOUTH EVERY DAY   enalapril (VASOTEC) 5 MG tablet Take 1 tablet (5 mg total) by  mouth daily.   esomeprazole (NEXIUM) 40 MG capsule Take 1 capsule (40 mg total) by mouth daily.   hydrOXYzine (ATARAX) 10 MG tablet TAKE 0.5 TABLETS (5 MG TOTAL) BY MOUTH 2 (TWO) TIMES DAILY AS NEEDED FOR ITCHING.   ipratropium-albuterol (DUONEB) 0.5-2.5 (3) MG/3ML SOLN Take 3 mLs by nebulization every 6 (six) hours as needed.   Multiple Vitamins-Minerals (HAIR SKIN NAILS PO) Take 3 tablets by mouth daily.   Multiple Vitamins-Minerals (MULTIVITAMIN WITH MINERALS) tablet Take 1 tablet by mouth daily.   mupirocin ointment (BACTROBAN) 2 % Apply to both nares bid for 5 days   nicotine (NICODERM CQ - DOSED IN MG/24 HOURS) 21 mg/24hr patch Place 21 mg onto the skin daily as needed (smoking cessation).   nitrofurantoin (MACRODANTIN) 100 MG capsule Take 100 mg by mouth daily.   oxyCODONE (OXYCONTIN) 10 mg 12 hr tablet Take 1 tablet (10 mg total) by mouth 2 (two) times daily.   pregabalin (LYRICA) 150 MG capsule Take 1 capsule (150 mg total) by mouth 3 (three) times daily.   ramelteon (ROZEREM) 8 MG tablet TAKE 1 TABLET BY MOUTH AT BEDTIME.   ramelteon (ROZEREM) 8 MG tablet TAKE 1 TABLET BY MOUTH AT BEDTIME.   sitaGLIPtin-metformin (JANUMET) 50-1000 MG tablet TAKE 1 TABLET BY MOUTH TWICE A DAY WITH A MEAL   Tiotropium Bromide Monohydrate (SPIRIVA RESPIMAT) 2.5 MCG/ACT AERS Inhale 2 puffs into the lungs daily.   XTAMPZA ER 13.5 MG C12A Take 13.5 mg by mouth 2  (two) times daily.   No facility-administered encounter medications on file as of 09/10/2022.    Allergies  Allergen Reactions   Avelox [Moxifloxacin] Anaphylaxis   Iohexol Anaphylaxis    PT.GIVEN OMNI 300 FOR CT ANGIO WAS GIVEN 50 MG.BENADRYL PRIOR TO SCAN HAD NO PROBLEMS    Shellfish Allergy Hives    Review of Systems  Constitutional:  Negative for activity change, appetite change, chills, diaphoresis, fatigue, fever and unexpected weight change.  HENT:  Negative for sore throat.   Eyes:  Negative for photophobia and visual disturbance.  Gastrointestinal:  Negative for abdominal pain, anorexia, constipation, diarrhea, nausea and vomiting.  Genitourinary:  Negative for decreased urine volume, difficulty urinating, dysuria, flank pain, frequency, hematuria, pelvic pain, urgency, vaginal bleeding, vaginal discharge and vaginal pain.  Musculoskeletal:  Negative for back pain and joint pain.  Skin:  Negative for rash.  Neurological:  Negative for headaches.        Objective:  BP 124/72   Pulse 96   Temp (!) 96.7 F (35.9 C) (Temporal)   Ht 5' 2"$  (1.575 m)   Wt 139 lb 3.2 oz (63.1 kg)   SpO2 95%   BMI 25.46 kg/m    Wt Readings from Last 3 Encounters:  09/10/22 139 lb 3.2 oz (63.1 kg)  08/06/22 139 lb (63 kg)  06/29/22 137 lb (62.1 kg)    Physical Exam Vitals and nursing note reviewed.  Constitutional:      General: She is not in acute distress.    Appearance: She is normal weight. She is ill-appearing (chronically ill). She is not toxic-appearing or diaphoretic.  HENT:     Head: Normocephalic and atraumatic.  Eyes:     Pupils: Pupils are equal, round, and reactive to light.  Cardiovascular:     Rate and Rhythm: Normal rate and regular rhythm.  Pulmonary:     Effort: Pulmonary effort is normal.     Breath sounds: Wheezing present.     Comments: O2 via [place] Abdominal:  Hernia: There is no hernia in the right inguinal area.  Genitourinary:    Exam position:  Lithotomy position.     Pubic Area: No rash or pubic lice.      Tanner stage (genital): 5.     Labia:        Right: No rash, tenderness, lesion or injury.        Left: No rash, tenderness, lesion or injury.      Urethra: No prolapse, urethral pain, urethral swelling or urethral lesion.     Vagina: Normal.     Rectum: Normal.     Comments: Post hysterectomy Lymphadenopathy:     Lower Body: No right inguinal adenopathy. No left inguinal adenopathy.  Skin:    General: Skin is warm and dry.     Capillary Refill: Capillary refill takes less than 2 seconds.  Neurological:     General: No focal deficit present.     Mental Status: She is alert and oriented to person, place, and time.  Psychiatric:        Mood and Affect: Mood normal.        Behavior: Behavior normal.        Thought Content: Thought content normal.        Judgment: Judgment normal.     Results for orders placed or performed during the hospital encounter of 07/01/22  Glucose, capillary  Result Value Ref Range   Glucose-Capillary 101 (H) 70 - 99 mg/dL  Surgical pathology  Result Value Ref Range   SURGICAL PATHOLOGY      SURGICAL PATHOLOGY CASE: [id number] PATIENT: [facility]     Clinical History: Hx colon polyps, nausea, GERD, constipation drug induced (crm)    FINAL MICROSCOPIC DIAGNOSIS:  A. STOMACH, BIOPSY: - Gastric antral mucosa with nonspecific reactive gastropathy - Gastric oxyntic mucosa with no specific histopathologic changes - Helicobacter pylori-like organisms are not identified on routine HE stain  B. COLON, CECUM, POLYPECTOMY: - Colonic neuroma, see comment  C. COLON, RECTOSIGMOID, POLYPECTOMY: - Serrated polyp with features of a sessile serrated polyp - Negative for cytologic dysplasia      COMMENT:  B.  There is a benign spindle cell proliferation arranged in short bundles.  The cells are positive for S100 and negative for CD68, consistent  with above interpretation.  Dr. Arby Barrette reviewed the case and concurs with the above diagnosis.     GROSS DESCRIPTION:  A.  Received in formalin are tan, soft tissue fragm ents that are submitted in toto. Number: 3 size: 0.1 to 0.3 cm blocks: 1  B.  Received in formalin is a tan, soft tissue fragment that is submitted in toto.  Size: 0.5 x 0.3 x 0.2 cm, 1 block submitted.  C.  Received in formalin is a tan, soft tissue fragment that is submitted in toto.  Size: 1.4 x 0.4 x 0.2 cm, 1 block submitted. (KW, 07/01/2022)   Final Diagnosis performed by Jaquita Folds, MD.   Electronically signed 07/07/2022 Technical component performed at [facility], [place] [address], [place], [place] [address].  Professional component performed at [facility], [place] [address] [place], [place] [address].  Immunohistochemistry Technical component (if applicable) was performed at [facility]. [address], [place], [place], [place] [address].   IMMUNOHISTOCHEMISTRY DISCLAIMER (if applicable): Some of these immunohistochemical stains may have been developed and the performance characteristics determi ne by [facility]. Some may not have been cleared or approved by  the U.S. Food and Drug Administration. The FDA has determined that such clearance or approval is not necessary. This test is used for clinical purposes. It should not be regarded as investigational or for research. This laboratory is certified under the [place] (CLIA-88) as qualified to perform high complexity clinical laboratory testing.  The controls stained appropriately.        Pertinent labs & imaging results that were available during my care of the patient were reviewed by me and considered in my medical decision making.  Assessment & Plan:  Margaret Vaughn was seen today for gynecologic exam.  Diagnoses and all orders  for this visit:  Encounter for well woman exam with routine gynecological exam Pt has had hysterectomy and will not longer require PAPs after today. Mammogram ordered.  -     Cytology - PAP([place]) -     MM Digital Screening; Future  Screening for cervical cancer -     Cytology - PAP([place])  Encounter for screening mammogram for malignant neoplasm of breast -     MM Digital Screening; Future     Continue all other maintenance medications.  Follow up plan: PCP as scheduled   Continue healthy lifestyle choices, including diet (rich in fruits, vegetables, and lean proteins, and low in salt and simple carbohydrates) and exercise (at least 30 minutes of moderate physical activity daily).  Educational handout given for Health Maintenance  The above assessment and management plan was discussed with the patient. The patient verbalized understanding of and has agreed to the management plan. Patient is aware to call the clinic if they develop any new symptoms or if symptoms persist or worsen. Patient is aware when to return to the clinic for a follow-up visit. Patient educated on when it is appropriate to go to the emergency department.   Monia Pouch, FNP-C [facility] [phone]

## 2022-09-22 LAB — CYTOLOGY - PAP
Comment: NEGATIVE
Diagnosis: UNDETERMINED — AB
High risk HPV: POSITIVE — AB

## 2022-09-22 NOTE — Addendum Note (Signed)
Addended by: Baruch Gouty on: 09/22/2022 12:31 PM   Modules accepted: Orders

## 2022-09-23 ENCOUNTER — Telehealth: Payer: Self-pay | Admitting: Family Medicine

## 2022-09-23 NOTE — Telephone Encounter (Signed)
Explained to pt that even having an hysterectomy she still has vaginal cells that could be positive for HPV. Explained that the GYN can perform other testing that we do not do here and that this could be something that goes away with time. Pt agreed and aware that referral has been placed to Dr. Elonda Husky.

## 2022-09-23 NOTE — Addendum Note (Signed)
Addended by: Baruch Gouty on: 09/23/2022 11:27 AM   Modules accepted: Orders

## 2022-09-25 ENCOUNTER — Encounter: Payer: Self-pay | Admitting: Family Medicine

## 2022-09-25 ENCOUNTER — Ambulatory Visit: Payer: Medicaid Other | Admitting: Family Medicine

## 2022-09-25 VITALS — BP 126/81 | HR 112 | Ht 62.0 in | Wt 139.0 lb

## 2022-09-25 DIAGNOSIS — M797 Fibromyalgia: Secondary | ICD-10-CM

## 2022-09-25 DIAGNOSIS — I7143 Infrarenal abdominal aortic aneurysm, without rupture: Secondary | ICD-10-CM | POA: Diagnosis not present

## 2022-09-25 DIAGNOSIS — E1142 Type 2 diabetes mellitus with diabetic polyneuropathy: Secondary | ICD-10-CM | POA: Diagnosis not present

## 2022-09-25 DIAGNOSIS — E1149 Type 2 diabetes mellitus with other diabetic neurological complication: Secondary | ICD-10-CM | POA: Diagnosis not present

## 2022-09-25 DIAGNOSIS — E1169 Type 2 diabetes mellitus with other specified complication: Secondary | ICD-10-CM | POA: Diagnosis not present

## 2022-09-25 DIAGNOSIS — E782 Mixed hyperlipidemia: Secondary | ICD-10-CM | POA: Diagnosis not present

## 2022-09-25 DIAGNOSIS — Z23 Encounter for immunization: Secondary | ICD-10-CM | POA: Diagnosis not present

## 2022-09-25 DIAGNOSIS — R0902 Hypoxemia: Secondary | ICD-10-CM | POA: Diagnosis not present

## 2022-09-25 DIAGNOSIS — I10 Essential (primary) hypertension: Secondary | ICD-10-CM

## 2022-09-25 LAB — BAYER DCA HB A1C WAIVED: HB A1C (BAYER DCA - WAIVED): 5.6 % (ref 4.8–5.6)

## 2022-09-25 NOTE — Progress Notes (Signed)
BP 126/81   Pulse (!) 112   Ht '5\' 2"'$  (1.575 m)   Wt 139 lb (63 kg)   SpO2 92%   BMI 25.42 kg/m    Subjective:   Patient ID: Margaret Vaughn, female    DOB: October 26, 1960, 62 y.o.   MRN: [id number]  HPI: Margaret Vaughn is a 62 y.o. female presenting on 09/25/2022 for Medical Management of Chronic Issues and Diabetes   HPI Type 2 diabetes mellitus Patient comes in today for recheck of his diabetes. Patient has been currently taking Janumet. Patient is currently on an ACE inhibitor/ARB. Patient has not seen an ophthalmologist this year. Patient has numbness in her feet and has some scabs on the inside of the fifth left toe and medial aspect of the first left toe and dorsum of the second right toe, no sign of infection. The symptom started onset as an adult hypertension and hyperlipidemia and neuropathy ARE RELATED TO DM   Hypertension Patient is currently on enalapril and, and their blood pressure today is 126/81. Patient denies any lightheadedness or dizziness. Patient denies headaches, blurred vision, chest pains, shortness of breath, or weakness. Denies any side effects from medication and is content with current medication.   Hyperlipidemia Patient is coming in for recheck of his hyperlipidemia. The patient is currently taking atorvastatin. They deny any issues with myalgias or history of liver damage from it. They deny any focal numbness or weakness or chest pain.     Fibromyalgia and chronic pain.  Patient sees a pain management specialist for fibromyalgia and chronic pain.  Dr. Donovan Kail.  Relevant past medical, surgical, family and social history reviewed and updated as indicated. Interim medical history since our last visit reviewed. Allergies and medications reviewed and updated.  Review of Systems  Constitutional:  Negative for chills and fever.  Eyes:  Negative for visual disturbance.  Respiratory:  Positive for cough, shortness of breath and wheezing (Has oxygen  concentrator). Negative for chest tightness.   Cardiovascular:  Negative for chest pain and leg swelling.  Genitourinary:  Negative for difficulty urinating and dysuria.  Musculoskeletal:  Negative for back pain and gait problem.  Skin:  Negative for rash.  Neurological:  Negative for light-headedness and headaches.  Psychiatric/Behavioral:  Negative for agitation and behavioral problems.   All other systems reviewed and are negative.   Per HPI unless specifically indicated above   Allergies as of 09/25/2022       Reactions   Avelox [moxifloxacin] Anaphylaxis   Iohexol Anaphylaxis   PT.GIVEN OMNI 300 FOR CT ANGIO WAS GIVEN 50 MG.BENADRYL PRIOR TO SCAN HAD NO PROBLEMS   Shellfish Allergy Hives        Medication List        Accurate as of September 25, 2022  3:45 PM. If you have any questions, ask your nurse or doctor.          albuterol 108 (90 Base) MCG/ACT inhaler Commonly known as: Ventolin HFA INHALE 2 PUFFS BY MOUTH EVERY 6 HOURS AS NEEDED FOR WHEEZE OR SHORTNESS OF BREATH   Amitiza 24 MCG capsule Generic drug: lubiprostone TAKE 1 CAPSULE (24 MCG TOTAL) BY MOUTH 2 (TWO) TIMES DAILY WITH A MEAL.   aspirin EC 81 MG tablet Take 81 mg by mouth daily. Swallow whole.   atorvastatin 40 MG tablet Commonly known as: LIPITOR Take 1 tablet (40 mg total) by mouth daily.   B-12 PO Take 1 tablet by mouth daily.   baclofen 10 MG  tablet Commonly known as: LIORESAL Take 10 mg by mouth 3 (three) times daily as needed for muscle spasms.   budesonide-formoterol 160-4.5 MCG/ACT inhaler Commonly known as: Symbicort Inhale 2 puffs into the lungs 2 (two) times daily.   cholecalciferol 25 MCG (1000 UNIT) tablet Commonly known as: VITAMIN D3 Take 1,000 Units by mouth daily.   cyclobenzaprine 10 MG tablet Commonly known as: FLEXERIL Take 1 tablet (10 mg total) by mouth at bedtime as needed for muscle spasms.   DULoxetine 60 MG capsule Commonly known as: CYMBALTA Take 1 capsule  (60 mg total) by mouth daily. TAKE 1 CAPSULE BY MOUTH EVERY DAY   enalapril 5 MG tablet Commonly known as: VASOTEC Take 1 tablet (5 mg total) by mouth daily.   esomeprazole 40 MG capsule Commonly known as: NEXIUM Take 1 capsule (40 mg total) by mouth daily.   HAIR SKIN NAILS PO Take 3 tablets by mouth daily.   multivitamin with minerals tablet Take 1 tablet by mouth daily.   hydrOXYzine 10 MG tablet Commonly known as: ATARAX TAKE 0.5 TABLETS (5 MG TOTAL) BY MOUTH 2 (TWO) TIMES DAILY AS NEEDED FOR ITCHING.   ipratropium-albuterol 0.5-2.5 (3) MG/3ML Soln Commonly known as: DUONEB Take 3 mLs by nebulization every 6 (six) hours as needed.   Janumet 50-1000 MG tablet Generic drug: sitaGLIPtin-metformin TAKE 1 TABLET BY MOUTH TWICE A DAY WITH A MEAL   mupirocin ointment 2 % Commonly known as: BACTROBAN Apply to both nares bid for 5 days   nicotine 21 mg/24hr patch Commonly known as: NICODERM CQ - dosed in mg/24 hours Place 21 mg onto the skin daily as needed (smoking cessation).   nitrofurantoin 100 MG capsule Commonly known as: MACRODANTIN Take 100 mg by mouth daily.   oxyCODONE 10 mg 12 hr tablet Commonly known as: OXYCONTIN Take 1 tablet (10 mg total) by mouth 2 (two) times daily.   pregabalin 150 MG capsule Commonly known as: LYRICA Take 1 capsule (150 mg total) by mouth 3 (three) times daily.   ramelteon 8 MG tablet Commonly known as: ROZEREM TAKE 1 TABLET BY MOUTH AT BEDTIME.   ramelteon 8 MG tablet Commonly known as: ROZEREM TAKE 1 TABLET BY MOUTH AT BEDTIME.   Spiriva Respimat 2.5 MCG/ACT Aers Generic drug: Tiotropium Bromide Monohydrate Inhale 2 puffs into the lungs daily.   Xtampza ER 13.5 MG C12a Generic drug: oxyCODONE ER Take 13.5 mg by mouth 2 (two) times daily.         Objective:   BP 126/81   Pulse (!) 112   Ht '5\' 2"'$  (1.575 m)   Wt 139 lb (63 kg)   SpO2 92%   BMI 25.42 kg/m   Wt Readings from Last 3 Encounters:  09/25/22 139 lb  (63 kg)  09/10/22 139 lb 3.2 oz (63.1 kg)  08/06/22 139 lb (63 kg)    Physical Exam Vitals and nursing note reviewed.  Constitutional:      General: She is not in acute distress.    Appearance: She is well-developed. She is not diaphoretic.  Eyes:     Conjunctiva/sclera: Conjunctivae normal.  Cardiovascular:     Rate and Rhythm: Normal rate and regular rhythm.     Heart sounds: Normal heart sounds. No murmur heard. Pulmonary:     Effort: Pulmonary effort is normal. No respiratory distress.     Breath sounds: Normal breath sounds. No wheezing.  Musculoskeletal:        General: Normal range of motion.  Skin:  General: Skin is warm and dry.     Findings: No rash.  Neurological:     Mental Status: She is alert and oriented to person, place, and time.     Coordination: Coordination normal.  Psychiatric:        Behavior: Behavior normal.       Assessment & Plan:   Problem List Items Addressed This Visit       Cardiovascular and Mediastinum   Essential hypertension, benign   Relevant Orders   CBC with Differential/Platelet   CMP14+EGFR   Lipid panel   Bayer DCA Hb A1c Waived (Completed)     Endocrine   Mixed diabetic hyperlipidemia associated with type 2 diabetes mellitus ([place])   Type 2 diabetes mellitus with diabetic polyneuropathy, without long-term current use of insulin ([place])   Relevant Orders   Ambulatory referral to Podiatry     Other   Hyperlipidemia   Relevant Orders   CBC with Differential/Platelet   CMP14+EGFR   Lipid panel   Bayer DCA Hb A1c Waived (Completed)   Fibromyalgia   Other Visit Diagnoses     Type 2 diabetes mellitus with neurological complications ([place])    -  Primary   Relevant Orders   CBC with Differential/Platelet   CMP14+EGFR   Lipid panel   Bayer DCA Hb A1c Waived (Completed)   Microalbumin / creatinine urine ratio   Ambulatory referral to Podiatry   Infrarenal abdominal aortic aneurysm (AAA) without rupture ([place])        Relevant Orders   VAS [place] AAA DUPLEX       Discussed the possibility of going to see vascular doctor for diminished circulation and patient would like to hold off for now. Follow up plan: Return in about 3 months (around 12/26/2022), or if symptoms worsen or fail to improve, for Diabetes and hypertension hyperlipidemia.  Counseling provided for all of the vaccine components Orders Placed This Encounter  Procedures   CBC with Differential/Platelet   CMP14+EGFR   Lipid panel   Bayer DCA Hb A1c Waived   Microalbumin / creatinine urine ratio   Ambulatory referral to Podiatry   VAS [place] AAA [place] Asah Lamay, MD [facility] 09/25/2022, 3:45 PM

## 2022-09-26 LAB — CBC WITH DIFFERENTIAL/PLATELET
Basophils Absolute: 0 10*3/uL (ref 0.0–0.2)
Basos: 0 %
EOS (ABSOLUTE): 0.1 10*3/uL (ref 0.0–0.4)
Eos: 1 %
Hematocrit: 45 % (ref 34.0–46.6)
Hemoglobin: 14.9 g/dL (ref 11.1–15.9)
Immature Grans (Abs): 0 10*3/uL (ref 0.0–0.1)
Immature Granulocytes: 0 %
Lymphocytes Absolute: 1.4 10*3/uL (ref 0.7–3.1)
Lymphs: 17 %
MCH: 31.4 pg (ref 26.6–33.0)
MCHC: 33.1 g/dL (ref 31.5–35.7)
MCV: 95 fL (ref 79–97)
Monocytes Absolute: 0.8 10*3/uL (ref 0.1–0.9)
Monocytes: 10 %
Neutrophils Absolute: 5.9 10*3/uL (ref 1.4–7.0)
Neutrophils: 72 %
Platelets: 285 10*3/uL (ref 150–450)
RBC: 4.75 x10E6/uL (ref 3.77–5.28)
RDW: 13.3 % (ref 11.7–15.4)
WBC: 8.2 10*3/uL (ref 3.4–10.8)

## 2022-09-26 LAB — CMP14+EGFR
ALT: 17 IU/L (ref 0–32)
AST: 16 IU/L (ref 0–40)
Albumin/Globulin Ratio: 1.7 (ref 1.2–2.2)
Albumin: 4 g/dL (ref 3.9–4.9)
Alkaline Phosphatase: 118 IU/L (ref 44–121)
BUN/Creatinine Ratio: 11 — ABNORMAL LOW (ref 12–28)
BUN: 9 mg/dL (ref 8–27)
Bilirubin Total: <0.2 mg/dL (ref 0.0–1.2)
CO2: 26 mmol/L (ref 20–29)
Calcium: 9.5 mg/dL (ref 8.7–10.3)
Chloride: 100 mmol/L (ref 96–106)
Creatinine, Ser: 0.82 mg/dL (ref 0.57–1.00)
Globulin, Total: 2.3 g/dL (ref 1.5–4.5)
Glucose: 109 mg/dL — ABNORMAL HIGH (ref 70–99)
Potassium: 4 mmol/L (ref 3.5–5.2)
Sodium: 143 mmol/L (ref 134–144)
Total Protein: 6.3 g/dL (ref 6.0–8.5)
eGFR: 81 mL/min/{1.73_m2} (ref >59)

## 2022-09-26 LAB — LIPID PANEL
Chol/HDL Ratio: 2.6 ratio (ref 0.0–4.4)
Cholesterol, Total: 116 mg/dL (ref 100–199)
HDL: 44 mg/dL (ref >39)
LDL Chol Calc (NIH): 47 mg/dL (ref 0–99)
Triglycerides: 142 mg/dL (ref 0–149)
VLDL Cholesterol Cal: 25 mg/dL (ref 5–40)

## 2022-09-28 ENCOUNTER — Encounter: Payer: Self-pay | Admitting: Family Medicine

## 2022-09-29 NOTE — Addendum Note (Signed)
Addended by: Alphonzo Dublin on: 09/29/2022 08:46 AM   Modules accepted: Orders

## 2022-09-30 ENCOUNTER — Telehealth (HOSPITAL_BASED_OUTPATIENT_CLINIC_OR_DEPARTMENT_OTHER): Payer: Self-pay | Admitting: *Deleted

## 2022-09-30 NOTE — Telephone Encounter (Signed)
Left message for patient to call and discuss scheduling the AAA duplex ordered by Dr. Lenna Sciara Dettinger

## 2022-10-02 DIAGNOSIS — F17200 Nicotine dependence, unspecified, uncomplicated: Secondary | ICD-10-CM | POA: Diagnosis not present

## 2022-10-02 DIAGNOSIS — G894 Chronic pain syndrome: Secondary | ICD-10-CM | POA: Diagnosis not present

## 2022-10-02 DIAGNOSIS — Z79899 Other long term (current) drug therapy: Secondary | ICD-10-CM | POA: Diagnosis not present

## 2022-10-02 DIAGNOSIS — M4802 Spinal stenosis, cervical region: Secondary | ICD-10-CM | POA: Diagnosis not present

## 2022-10-02 DIAGNOSIS — E119 Type 2 diabetes mellitus without complications: Secondary | ICD-10-CM | POA: Diagnosis not present

## 2022-10-02 DIAGNOSIS — G950 Syringomyelia and syringobulbia: Secondary | ICD-10-CM | POA: Diagnosis not present

## 2022-10-02 DIAGNOSIS — R030 Elevated blood-pressure reading, without diagnosis of hypertension: Secondary | ICD-10-CM | POA: Diagnosis not present

## 2022-10-02 DIAGNOSIS — J449 Chronic obstructive pulmonary disease, unspecified: Secondary | ICD-10-CM | POA: Diagnosis not present

## 2022-10-02 DIAGNOSIS — Z6823 Body mass index (BMI) 23.0-23.9, adult: Secondary | ICD-10-CM | POA: Diagnosis not present

## 2022-10-02 DIAGNOSIS — F17210 Nicotine dependence, cigarettes, uncomplicated: Secondary | ICD-10-CM | POA: Diagnosis not present

## 2022-10-02 DIAGNOSIS — R110 Nausea: Secondary | ICD-10-CM | POA: Diagnosis not present

## 2022-10-03 ENCOUNTER — Other Ambulatory Visit: Payer: Self-pay | Admitting: Family Medicine

## 2022-10-03 DIAGNOSIS — G950 Syringomyelia and syringobulbia: Secondary | ICD-10-CM

## 2022-10-05 NOTE — Telephone Encounter (Signed)
Last office visit 09/25/22 Last refill 03/20/22, #90, 1 refill

## 2022-10-05 NOTE — Telephone Encounter (Signed)
Last OV 09/25/22. Last RF 03/20/22 #90 with 1 refill. Next OV 01/01/23

## 2022-10-16 ENCOUNTER — Encounter: Payer: Medicaid Other | Admitting: Family Medicine

## 2022-10-23 ENCOUNTER — Other Ambulatory Visit: Payer: Self-pay | Admitting: Family Medicine

## 2022-10-23 DIAGNOSIS — E782 Mixed hyperlipidemia: Secondary | ICD-10-CM

## 2022-10-26 DIAGNOSIS — R0902 Hypoxemia: Secondary | ICD-10-CM | POA: Diagnosis not present

## 2022-10-29 DIAGNOSIS — S12130D Unspecified traumatic displaced spondylolisthesis of second cervical vertebra, subsequent encounter for fracture with routine healing: Secondary | ICD-10-CM | POA: Diagnosis not present

## 2022-10-29 DIAGNOSIS — M4312 Spondylolisthesis, cervical region: Secondary | ICD-10-CM | POA: Diagnosis not present

## 2022-10-29 DIAGNOSIS — G959 Disease of spinal cord, unspecified: Secondary | ICD-10-CM | POA: Diagnosis not present

## 2022-10-30 DIAGNOSIS — R110 Nausea: Secondary | ICD-10-CM | POA: Diagnosis not present

## 2022-10-30 DIAGNOSIS — R030 Elevated blood-pressure reading, without diagnosis of hypertension: Secondary | ICD-10-CM | POA: Diagnosis not present

## 2022-10-30 DIAGNOSIS — J449 Chronic obstructive pulmonary disease, unspecified: Secondary | ICD-10-CM | POA: Diagnosis not present

## 2022-10-30 DIAGNOSIS — F17210 Nicotine dependence, cigarettes, uncomplicated: Secondary | ICD-10-CM | POA: Diagnosis not present

## 2022-10-30 DIAGNOSIS — E119 Type 2 diabetes mellitus without complications: Secondary | ICD-10-CM | POA: Diagnosis not present

## 2022-10-30 DIAGNOSIS — G894 Chronic pain syndrome: Secondary | ICD-10-CM | POA: Diagnosis not present

## 2022-10-30 DIAGNOSIS — Z6823 Body mass index (BMI) 23.0-23.9, adult: Secondary | ICD-10-CM | POA: Diagnosis not present

## 2022-10-30 DIAGNOSIS — F17200 Nicotine dependence, unspecified, uncomplicated: Secondary | ICD-10-CM | POA: Diagnosis not present

## 2022-10-30 DIAGNOSIS — Z79899 Other long term (current) drug therapy: Secondary | ICD-10-CM | POA: Diagnosis not present

## 2022-10-30 DIAGNOSIS — M4802 Spinal stenosis, cervical region: Secondary | ICD-10-CM | POA: Diagnosis not present

## 2022-10-30 DIAGNOSIS — G950 Syringomyelia and syringobulbia: Secondary | ICD-10-CM | POA: Diagnosis not present

## 2022-11-06 ENCOUNTER — Encounter: Payer: Self-pay | Admitting: Obstetrics & Gynecology

## 2022-11-06 ENCOUNTER — Other Ambulatory Visit: Payer: Self-pay | Admitting: Family Medicine

## 2022-11-06 ENCOUNTER — Ambulatory Visit (INDEPENDENT_AMBULATORY_CARE_PROVIDER_SITE_OTHER): Payer: Medicaid Other | Admitting: Obstetrics & Gynecology

## 2022-11-06 VITALS — BP 121/80 | HR 100 | Wt 138.0 lb

## 2022-11-06 DIAGNOSIS — N952 Postmenopausal atrophic vaginitis: Secondary | ICD-10-CM | POA: Diagnosis not present

## 2022-11-06 DIAGNOSIS — R87810 Cervical high risk human papillomavirus (HPV) DNA test positive: Secondary | ICD-10-CM

## 2022-11-06 DIAGNOSIS — N941 Unspecified dyspareunia: Secondary | ICD-10-CM | POA: Diagnosis not present

## 2022-11-06 DIAGNOSIS — J441 Chronic obstructive pulmonary disease with (acute) exacerbation: Secondary | ICD-10-CM

## 2022-11-06 DIAGNOSIS — R87610 Atypical squamous cells of undetermined significance on cytologic smear of cervix (ASC-US): Secondary | ICD-10-CM

## 2022-11-06 DIAGNOSIS — I10 Essential (primary) hypertension: Secondary | ICD-10-CM

## 2022-11-06 MED ORDER — PREMARIN 0.625 MG/GM VA CREA: TOPICAL_CREAM | VAGINAL | 4 refills | Status: DC

## 2022-11-06 NOTE — Progress Notes (Signed)
Patient name: Margaret Vaughn MRN [id number]  Date of birth: 1961-03-05 Chief Complaint:   Colposcopy  History of Present Illness:   Margaret Vaughn is a 62 y.o. PM, PH female being seen today for cervical dysplasia management.  Smoker:  Yes.   New sexual partner:  No.   Patient reports history of hysterectomy due to uterine fibroids and possibly dysplasia.  Denies vaginal bleeding discharge itching or irritation.  She is sexually active and does note dyspareunia.  She previously was using Femring however insurance stopped covering  No LMP recorded. Patient has had a hysterectomy.     09/25/2022    3:10 PM 06/26/2022    2:45 PM 03/20/2022    2:16 PM 12/18/2021    1:49 PM 12/12/2020    1:20 PM  Depression screen PHQ 2/9  Decreased Interest 0  Down, Depressed, Hopeless 0 0  PHQ - 2 Score 0  Altered sleeping Tired, decreased energy Change in appetite Feeling bad or failure about yourself  0   Trouble concentrating Moving slowly or fidgety/restless 0 0 1 1   Suicidal thoughts 0   PHQ-9 Score Difficult doing work/chores Not difficult at all Not difficult at all Not difficult at all Very difficult      Review of Systems:   Pertinent items are noted in HPI Denies fever/chills, dizziness, headaches, visual disturbances, fatigue, shortness of breath, chest pain, abdominal pain, vomiting. Pertinent History Reviewed:  Reviewed past medical,surgical, social, obstetrical and family history.  Reviewed problem list, medications and allergies. Physical Assessment:   Vitals:   11/06/22 1231  BP: 121/80  Pulse: 100  Weight: 138 lb (62.6 kg)  Body mass index is 25.24 kg/m.       Physical Examination:   General appearance: alert, well appearing, and in no distress  Psych: mood appropriate, normal affect  Skin: warm & dry   Cardiovascular: normal heart rate noted  Respiratory: normal  respiratory effort, no distress  Abdomen: soft, non-tender   Pelvic: VULVA: normal appearing vulva with no masses, tenderness or lesions, VAGINA: Flat dry mucosa no abnormal discharge, see colposcopy section  Extremities: no edema, no calf tenderness bilaterally  Chaperone: Faith Rogue     Colposcopy Procedure Note  Indications: ASCUS, HPV positive    Procedure Details  The risks and benefits of the procedure and Written informed consent obtained.  Speculum placed in vagina and excellent visualization of cervix achieved, cervix swabbed x 3 with acetic acid solution.  Findings: Adequate colposcopy is noted today.  No acute abnormalities noted other than atrophic changes   -Uterus and cervix surgically absent.    Specimens: None  Complications: None.  Colposcopic Impression: Atrophic changes   Plan(Based on 2019 ASCCP recommendations) Dyplasia: -Discussed HPV- reviewed incidence and its potential to cause condylomas to dysplasia to cervical cancer -Colposcopy completed as above Vaginal atrophy, dyspareunia: -Discussed management options including over-the-counter Replens, or vaginal estrogen therapy.  Patient desires to restart vaginal estrogen treatment  Pt denies history of any of the following:  Untreated hypertension, active liver disease with abnormal liver function tests, active or recent arterial thromboembolic disease, or previous or current venous thromboembolism   Meds ordered this encounter  Medications   conjugated estrogens (  PREMARIN) vaginal cream    Sig: Pea-sized amount (0.5g) twice weekly    Dispense:  42.5 g    Refill:  4    Myna Hidalgo, DO Attending Obstetrician & Gynecologist, [facility] [employer], [facility]

## 2022-11-12 ENCOUNTER — Encounter: Payer: Self-pay | Admitting: Gastroenterology

## 2022-11-13 ENCOUNTER — Other Ambulatory Visit (HOSPITAL_BASED_OUTPATIENT_CLINIC_OR_DEPARTMENT_OTHER): Payer: Medicaid Other

## 2022-11-17 ENCOUNTER — Other Ambulatory Visit: Payer: Self-pay | Admitting: Neurosurgery

## 2022-11-17 DIAGNOSIS — S12130D Unspecified traumatic displaced spondylolisthesis of second cervical vertebra, subsequent encounter for fracture with routine healing: Secondary | ICD-10-CM

## 2022-11-25 DIAGNOSIS — R0902 Hypoxemia: Secondary | ICD-10-CM | POA: Diagnosis not present

## 2022-11-27 ENCOUNTER — Other Ambulatory Visit: Payer: Self-pay | Admitting: Family Medicine

## 2022-11-27 DIAGNOSIS — E1169 Type 2 diabetes mellitus with other specified complication: Secondary | ICD-10-CM

## 2022-12-04 DIAGNOSIS — G894 Chronic pain syndrome: Secondary | ICD-10-CM | POA: Diagnosis not present

## 2022-12-04 DIAGNOSIS — M4802 Spinal stenosis, cervical region: Secondary | ICD-10-CM | POA: Diagnosis not present

## 2022-12-04 DIAGNOSIS — Z9181 History of falling: Secondary | ICD-10-CM | POA: Diagnosis not present

## 2022-12-04 DIAGNOSIS — Z6823 Body mass index (BMI) 23.0-23.9, adult: Secondary | ICD-10-CM | POA: Diagnosis not present

## 2022-12-04 DIAGNOSIS — E119 Type 2 diabetes mellitus without complications: Secondary | ICD-10-CM | POA: Diagnosis not present

## 2022-12-04 DIAGNOSIS — F17200 Nicotine dependence, unspecified, uncomplicated: Secondary | ICD-10-CM | POA: Diagnosis not present

## 2022-12-04 DIAGNOSIS — J449 Chronic obstructive pulmonary disease, unspecified: Secondary | ICD-10-CM | POA: Diagnosis not present

## 2022-12-04 DIAGNOSIS — F17210 Nicotine dependence, cigarettes, uncomplicated: Secondary | ICD-10-CM | POA: Diagnosis not present

## 2022-12-04 DIAGNOSIS — R110 Nausea: Secondary | ICD-10-CM | POA: Diagnosis not present

## 2022-12-04 DIAGNOSIS — R030 Elevated blood-pressure reading, without diagnosis of hypertension: Secondary | ICD-10-CM | POA: Diagnosis not present

## 2022-12-04 DIAGNOSIS — G950 Syringomyelia and syringobulbia: Secondary | ICD-10-CM | POA: Diagnosis not present

## 2022-12-09 ENCOUNTER — Other Ambulatory Visit (HOSPITAL_COMMUNITY): Payer: Self-pay

## 2022-12-09 ENCOUNTER — Telehealth: Payer: Self-pay

## 2022-12-09 NOTE — Telephone Encounter (Signed)
PA renewal request received via CMM for Ramelteon 8MG  tablets  PA submitted to [facility] [place] [employer] and has been APPROVED from 12/09/2022-06/06/2023  Key: [id number]

## 2022-12-18 ENCOUNTER — Ambulatory Visit (INDEPENDENT_AMBULATORY_CARE_PROVIDER_SITE_OTHER): Payer: Medicaid Other

## 2022-12-18 DIAGNOSIS — Z87891 Personal history of nicotine dependence: Secondary | ICD-10-CM | POA: Diagnosis not present

## 2022-12-18 DIAGNOSIS — I7143 Infrarenal abdominal aortic aneurysm, without rupture: Secondary | ICD-10-CM | POA: Diagnosis not present

## 2022-12-25 ENCOUNTER — Ambulatory Visit
Admission: RE | Admit: 2022-12-25 | Discharge: 2022-12-25 | Disposition: A | Discharge status: Home / Self Care | Payer: Medicaid Other | Source: Ambulatory Visit | Attending: Neurosurgery | Admitting: Neurosurgery

## 2022-12-25 DIAGNOSIS — S12130D Unspecified traumatic displaced spondylolisthesis of second cervical vertebra, subsequent encounter for fracture with routine healing: Secondary | ICD-10-CM

## 2022-12-25 DIAGNOSIS — M4312 Spondylolisthesis, cervical region: Secondary | ICD-10-CM | POA: Diagnosis not present

## 2022-12-25 DIAGNOSIS — Z981 Arthrodesis status: Secondary | ICD-10-CM | POA: Diagnosis not present

## 2022-12-25 DIAGNOSIS — M47812 Spondylosis without myelopathy or radiculopathy, cervical region: Secondary | ICD-10-CM | POA: Diagnosis not present

## 2022-12-26 DIAGNOSIS — R0902 Hypoxemia: Secondary | ICD-10-CM | POA: Diagnosis not present

## 2023-01-01 ENCOUNTER — Encounter: Payer: Self-pay | Admitting: Family Medicine

## 2023-01-01 ENCOUNTER — Ambulatory Visit: Payer: Medicaid Other | Admitting: Family Medicine

## 2023-01-01 VITALS — BP 97/67 | HR 105 | Ht 62.0 in | Wt 137.0 lb

## 2023-01-01 DIAGNOSIS — E1169 Type 2 diabetes mellitus with other specified complication: Secondary | ICD-10-CM

## 2023-01-01 DIAGNOSIS — E782 Mixed hyperlipidemia: Secondary | ICD-10-CM | POA: Diagnosis not present

## 2023-01-01 DIAGNOSIS — G950 Syringomyelia and syringobulbia: Secondary | ICD-10-CM | POA: Diagnosis not present

## 2023-01-01 DIAGNOSIS — R030 Elevated blood-pressure reading, without diagnosis of hypertension: Secondary | ICD-10-CM | POA: Diagnosis not present

## 2023-01-01 DIAGNOSIS — R110 Nausea: Secondary | ICD-10-CM | POA: Diagnosis not present

## 2023-01-01 DIAGNOSIS — Z9181 History of falling: Secondary | ICD-10-CM | POA: Diagnosis not present

## 2023-01-01 DIAGNOSIS — G629 Polyneuropathy, unspecified: Secondary | ICD-10-CM

## 2023-01-01 DIAGNOSIS — E1149 Type 2 diabetes mellitus with other diabetic neurological complication: Secondary | ICD-10-CM | POA: Diagnosis not present

## 2023-01-01 DIAGNOSIS — Z79899 Other long term (current) drug therapy: Secondary | ICD-10-CM | POA: Diagnosis not present

## 2023-01-01 DIAGNOSIS — F17200 Nicotine dependence, unspecified, uncomplicated: Secondary | ICD-10-CM | POA: Diagnosis not present

## 2023-01-01 DIAGNOSIS — Z7984 Long term (current) use of oral hypoglycemic drugs: Secondary | ICD-10-CM

## 2023-01-01 DIAGNOSIS — E1142 Type 2 diabetes mellitus with diabetic polyneuropathy: Secondary | ICD-10-CM | POA: Diagnosis not present

## 2023-01-01 DIAGNOSIS — E119 Type 2 diabetes mellitus without complications: Secondary | ICD-10-CM | POA: Diagnosis not present

## 2023-01-01 DIAGNOSIS — M797 Fibromyalgia: Secondary | ICD-10-CM | POA: Diagnosis not present

## 2023-01-01 DIAGNOSIS — M4802 Spinal stenosis, cervical region: Secondary | ICD-10-CM | POA: Diagnosis not present

## 2023-01-01 DIAGNOSIS — I10 Essential (primary) hypertension: Secondary | ICD-10-CM | POA: Diagnosis not present

## 2023-01-01 DIAGNOSIS — Z6823 Body mass index (BMI) 23.0-23.9, adult: Secondary | ICD-10-CM | POA: Diagnosis not present

## 2023-01-01 DIAGNOSIS — G894 Chronic pain syndrome: Secondary | ICD-10-CM | POA: Diagnosis not present

## 2023-01-01 DIAGNOSIS — J449 Chronic obstructive pulmonary disease, unspecified: Secondary | ICD-10-CM | POA: Diagnosis not present

## 2023-01-01 LAB — BAYER DCA HB A1C WAIVED: HB A1C (BAYER DCA - WAIVED): 5.4 % (ref 4.8–5.6)

## 2023-01-01 NOTE — Patient Instructions (Signed)
Our records indicate that you are due for your screening mammogram.  Please call the imaging center that does your yearly mammograms to make an appointment for a mammogram at your earliest convenience. Our office also has a mobile unit through the [facility] that comes to our location. Please call our office if you would like to make an appointment.   

## 2023-01-01 NOTE — Progress Notes (Signed)
BP 97/67   Pulse (!) 105   Ht 5\' 2"  (1.575 m)   Wt 137 lb (62.1 kg)   SpO2 (!) 86% Comment: While using portable O2  BMI 25.06 kg/m    Subjective:   Patient ID: Margaret Vaughn, female    DOB: July 19, 1961, 62 y.o.   MRN: [id number]  HPI: Margaret Vaughn is a 62 y.o. female presenting on 01/01/2023 for Medical Management of Chronic Issues and Diabetes   HPI Type 2 diabetes mellitus Patient comes in today for recheck of his diabetes. Patient has been currently taking Janumet. Patient is currently on an ACE inhibitor/ARB. Patient has not seen an ophthalmologist this year. Patient denies any new issues with their feet. The symptom started onset as an adult hypertension and hyperlipidemia and neuropathy ARE RELATED TO DM   Hypertension Patient is currently on enalapril, and their blood pressure today is 97/67. Patient denies any lightheadedness or dizziness. Patient denies headaches, blurred vision, chest pains, shortness of breath, or weakness. Denies any side effects from medication and is content with current medication.   Hyperlipidemia Patient is coming in for recheck of his hyperlipidemia. The patient is currently taking atorvastatin. They deny any issues with myalgias or history of liver damage from it. They deny any focal numbness or weakness or chest pain.   Relevant past medical, surgical, family and social history reviewed and updated as indicated. Interim medical history since our last visit reviewed. Allergies and medications reviewed and updated.  Review of Systems  Constitutional:  Negative for chills and fever.  HENT:  Negative for congestion, ear discharge and ear pain.   Eyes:  Negative for visual disturbance.  Respiratory:  Positive for shortness of breath. Negative for chest tightness.   Cardiovascular:  Negative for chest pain and leg swelling.  Genitourinary:  Negative for difficulty urinating and dysuria.  Musculoskeletal:  Negative for back pain and gait  problem.  Skin:  Negative for rash.  Neurological:  Negative for dizziness, light-headedness and headaches.  Psychiatric/Behavioral:  Negative for agitation and behavioral problems.   All other systems reviewed and are negative.   Per HPI unless specifically indicated above   Allergies as of 01/01/2023       Reactions   Avelox [moxifloxacin] Anaphylaxis   Iohexol Anaphylaxis   PT.GIVEN OMNI 300 FOR CT ANGIO WAS GIVEN 50 MG.BENADRYL PRIOR TO SCAN HAD NO PROBLEMS   Shellfish Allergy Hives        Medication List        Accurate as of January 01, 2023  3:05 PM. If you have any questions, ask your nurse or doctor.          STOP taking these medications    Xtampza ER 13.5 MG C12a Generic drug: oxyCODONE ER Stopped by: Elige Radon Kinta Martis, MD       TAKE these medications    albuterol 108 (90 Base) MCG/ACT inhaler Commonly known as: Ventolin HFA INHALE 2 PUFFS BY MOUTH EVERY 6 HOURS AS NEEDED FOR WHEEZE OR SHORTNESS OF BREATH   Amitiza 24 MCG capsule Generic drug: lubiprostone TAKE 1 CAPSULE (24 MCG TOTAL) BY MOUTH 2 (TWO) TIMES DAILY WITH A MEAL.   aspirin EC 81 MG tablet Take 81 mg by mouth daily. Swallow whole.   atorvastatin 40 MG tablet Commonly known as: LIPITOR TAKE 1 TABLET BY MOUTH EVERY DAY   B-12 PO Take 1 tablet by mouth daily.   baclofen 10 MG tablet Commonly known as: LIORESAL Take 10 mg  by mouth 3 (three) times daily as needed for muscle spasms.   budesonide-formoterol 160-4.5 MCG/ACT inhaler Commonly known as: Symbicort Inhale 2 puffs into the lungs 2 (two) times daily.   Butrans 5 MCG/HR Ptwk Generic drug: buprenorphine Place 1 patch onto the skin once a week.   cholecalciferol 25 MCG (1000 UNIT) tablet Commonly known as: VITAMIN D3 Take 1,000 Units by mouth daily.   cyclobenzaprine 10 MG tablet Commonly known as: FLEXERIL TAKE 1 TABLET BY MOUTH AT BEDTIME AS NEEDED FOR MUSCLE SPASMS   DULoxetine 60 MG capsule Commonly known as:  CYMBALTA Take 1 capsule (60 mg total) by mouth daily. TAKE 1 CAPSULE BY MOUTH EVERY DAY   enalapril 5 MG tablet Commonly known as: VASOTEC TAKE 1 TABLET (5 MG TOTAL) BY MOUTH DAILY.   esomeprazole 40 MG capsule Commonly known as: NEXIUM Take 1 capsule (40 mg total) by mouth daily.   HAIR SKIN NAILS PO Take 3 tablets by mouth daily.   multivitamin with minerals tablet Take 1 tablet by mouth daily.   hydrOXYzine 10 MG tablet Commonly known as: ATARAX TAKE 0.5 TABLETS (5 MG TOTAL) BY MOUTH 2 (TWO) TIMES DAILY AS NEEDED FOR ITCHING.   ipratropium-albuterol 0.5-2.5 (3) MG/3ML Soln Commonly known as: DUONEB Take 3 mLs by nebulization every 6 (six) hours as needed.   Janumet 50-1000 MG tablet Generic drug: sitaGLIPtin-metformin TAKE 1 TABLET BY MOUTH TWICE A DAY WITH MEALS   mupirocin ointment 2 % Commonly known as: BACTROBAN Apply to both nares bid for 5 days   nicotine 21 mg/24hr patch Commonly known as: NICODERM CQ - dosed in mg/24 hours Place 21 mg onto the skin daily as needed (smoking cessation).   nitrofurantoin 100 MG capsule Commonly known as: MACRODANTIN Take 100 mg by mouth daily.   oxyCODONE 10 mg 12 hr tablet Commonly known as: OXYCONTIN Take 1 tablet (10 mg total) by mouth 2 (two) times daily.   pregabalin 150 MG capsule Commonly known as: LYRICA Take 1 capsule (150 mg total) by mouth 3 (three) times daily.   Premarin vaginal cream Generic drug: conjugated estrogens Pea-sized amount (0.5g) twice weekly   ramelteon 8 MG tablet Commonly known as: ROZEREM TAKE 1 TABLET BY MOUTH AT BEDTIME.   ramelteon 8 MG tablet Commonly known as: ROZEREM TAKE 1 TABLET BY MOUTH AT BEDTIME.   Spiriva Respimat 2.5 MCG/ACT Aers Generic drug: Tiotropium Bromide Monohydrate Inhale 2 puffs into the lungs daily.         Objective:   BP 97/67   Pulse (!) 105   Ht 5\' 2"  (1.575 m)   Wt 137 lb (62.1 kg)   SpO2 (!) 86% Comment: While using portable O2  BMI 25.06  kg/m   Wt Readings from Last 3 Encounters:  01/01/23 137 lb (62.1 kg)  11/06/22 138 lb (62.6 kg)  09/25/22 139 lb (63 kg)    Physical Exam Vitals and nursing note reviewed.  Constitutional:      General: She is not in acute distress.    Appearance: She is well-developed. She is not diaphoretic.  Eyes:     Conjunctiva/sclera: Conjunctivae normal.  Cardiovascular:     Rate and Rhythm: Normal rate and regular rhythm.     Heart sounds: Normal heart sounds. No murmur heard. Pulmonary:     Effort: Pulmonary effort is normal. No respiratory distress.     Breath sounds: Normal breath sounds. No wheezing, rhonchi or rales.  Musculoskeletal:        General:  No tenderness. Normal range of motion.  Skin:    General: Skin is warm and dry.     Findings: No rash.  Neurological:     Mental Status: She is alert and oriented to person, place, and time.     Coordination: Coordination normal.  Psychiatric:        Behavior: Behavior normal.       Assessment & Plan:   Problem List Items Addressed This Visit       Cardiovascular and Mediastinum   Essential hypertension, benign     Endocrine   Mixed diabetic hyperlipidemia associated with type 2 diabetes mellitus (HCC)   Type 2 diabetes mellitus with diabetic polyneuropathy, without long-term current use of insulin (HCC)     Nervous and Auditory   Neuropathy     Other   Hyperlipidemia   Fibromyalgia   Relevant Medications   buprenorphine (BUTRANS) 5 MCG/HR PTWK   Other Visit Diagnoses     Type 2 diabetes mellitus with neurological complications (HCC)    -  Primary   Relevant Orders   Bayer DCA Hb A1c Waived       A1c looks good at 5.4.  Patient will discuss possible medication for weight loss but I encouraged her that weight loss may not be in her best health outlook because of her severe emphysema concerns that if she got to think she would not have stores. Follow up plan: Return in about 3 months (around 04/03/2023), or if  symptoms worsen or fail to improve, for Diabetes.  Counseling provided for all of the vaccine components Orders Placed This Encounter  Procedures   Bayer DCA Hb A1c Waived    Arville Care, MD [facility] 01/01/2023, 3:05 PM

## 2023-01-12 ENCOUNTER — Ambulatory Visit
Admission: EM | Admit: 2023-01-12 | Discharge: 2023-01-12 | Disposition: A | Discharge status: Home / Self Care | Payer: Medicaid Other | Attending: Nurse Practitioner | Admitting: Nurse Practitioner

## 2023-01-12 DIAGNOSIS — J441 Chronic obstructive pulmonary disease with (acute) exacerbation: Secondary | ICD-10-CM

## 2023-01-12 DIAGNOSIS — G959 Disease of spinal cord, unspecified: Secondary | ICD-10-CM | POA: Diagnosis not present

## 2023-01-12 MED ORDER — BENZONATATE 100 MG PO CAPS: 100.0000 mg | ORAL_CAPSULE | Freq: Three times a day (TID) | ORAL | 0 refills | Status: DC | PRN

## 2023-01-12 MED ORDER — IPRATROPIUM-ALBUTEROL 0.5-2.5 (3) MG/3ML IN SOLN
3.0000 mL | Freq: Once | RESPIRATORY_TRACT | Status: AC
  Administered 2023-01-12: 3 mL via RESPIRATORY_TRACT

## 2023-01-12 MED ORDER — AZITHROMYCIN 250 MG PO TABS: ORAL_TABLET | ORAL | 0 refills | Status: DC

## 2023-01-12 MED ORDER — METHYLPREDNISOLONE ACETATE 40 MG/ML IJ SUSP
40.0000 mg | Freq: Once | INTRAMUSCULAR | Status: AC
  Administered 2023-01-12: 40 mg via INTRAMUSCULAR

## 2023-01-12 MED ORDER — PREDNISONE 20 MG PO TABS: 40.0000 mg | ORAL_TABLET | Freq: Every day | ORAL | 0 refills | Status: AC

## 2023-01-12 NOTE — ED Triage Notes (Signed)
Pt c/o cough and congestion , pt states it started with cough and congestion feels like being punched in the chest, sore throat, coughing so hard she is vomiting.

## 2023-01-12 NOTE — ED Provider Notes (Signed)
[facility]    CSN: [id number] Arrival date & time: 01/12/23  1234      History   Chief Complaint No chief complaint on file.   HPI Margaret Vaughn is a 62 y.o. female.   Patient presents today for 5-day history of slight fever, body aches and chills, congested cough, shortness of breath and wheezing, chest pain after coughing, chest tightness, chest and nasal congestion, runny nose, sore throat, headache, decreased appetite, and fatigue.  She denies dry cough, chest pain at rest, ear pain, abdominal pain, nausea/vomiting, and diarrhea.  Reports her son and husband are sick with similar symptoms that started around the same time as her.  Has been taking Mucinex and using albuterol inhaler more frequently with minimal improvement.  Patient has medical history significant for COPD, dependent on 3 L of oxygen via nasal cannula and takes Spiriva daily.    Past Medical History:  Diagnosis Date   Acid reflux    Allergies    Anemia    Asthma    Bronchitis    Cervical stenosis of spine    Chronic pain syndrome    Complication of anesthesia    difficulty waking up from anesthesia   COPD (chronic obstructive pulmonary disease) (HCC)    DDD (degenerative disc disease), lumbar    Depression with anxiety    Diabetes mellitus without complication (HCC)    Dyspnea    Dysrhythmia    PSVT   Fatigue    H/O blood clots    H/O echocardiogram 1989   Per pt, done @ 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   Myelopathy (HCC) 03/10/2021   HNP (herniated nucleus pulposus) with myelopathy, cervical 12/18/2020   Cervical myelopathy (HCC) 08/13/2020   Cervical spinal cord compression (HCC) 08/08/2020   Chronic respiratory failure with hypoxia (HCC) 08/08/2020   Type 2 diabetes mellitus with diabetic polyneuropathy, without long-term current use of insulin (HCC) 08/08/2020   Fibromyalgia 01/06/2019   Neuropathy 01/06/2019   Overweight 03/11/2018   COPD (chronic obstructive pulmonary disease) (HCC) 03/11/2018   Mixed diabetic hyperlipidemia associated with type 2 diabetes mellitus (HCC) 03/11/2018   PTSD (post-traumatic stress disorder) 03/11/2018   GAD (generalized anxiety disorder) 01/30/2014   Essential hypertension, benign 01/30/2014   Hyperlipidemia 08/10/2012   Tobacco abuse 08/10/2012   GERD 12/24/2009    Past Surgical History:  Procedure Laterality Date   ABDOMINAL HYSTERECTOMY     ANTERIOR CERVICAL DECOMP/DISCECTOMY FUSION N/A 12/18/2020   Procedure: CERVICAL TWO-THREE ANTERIOR CERVICAL DISCECTOMY/DECOMPRESSION FUSION;  Surgeon: Donalee Citrin, MD;  Location: [facility] OR;  Service: Neurosurgery;  Laterality: N/A;   APPENDECTOMY  1992   BIOPSY  07/01/2022  Procedure: BIOPSY;  Surgeon: Corbin Ade, MD;  [facility]: [facility];  Service: Endoscopy;;   CHOLECYSTECTOMY  1992   COLONOSCOPY WITH PROPOFOL N/A 07/01/2022   Procedure: COLONOSCOPY WITH PROPOFOL;  Surgeon: Corbin Ade, MD;  Location: [facility];  Service: Endoscopy;  Laterality: N/A;  12:00 pm   DIAGNOSTIC LAPAROSCOPY  1992   lap chole   ESOPHAGOGASTRODUODENOSCOPY (EGD) WITH PROPOFOL N/A 07/01/2022   Procedure: ESOPHAGOGASTRODUODENOSCOPY (EGD) WITH PROPOFOL;  Surgeon: Corbin Ade, MD;  Location: [facility];  Service: Endoscopy;  Laterality: N/A;   HEMOSTASIS CLIP PLACEMENT  07/01/2022    Procedure: HEMOSTASIS CLIP PLACEMENT;  Surgeon: Corbin Ade, MD;  Location: [facility];  Service: Endoscopy;;   HERNIA REPAIR     umbilical... as a child   MALONEY DILATION  07/01/2022   Procedure: MALONEY DILATION;  Surgeon: Corbin Ade, MD;  [facility]: [facility];  Service: Endoscopy;;   OOPHORECTOMY     POLYPECTOMY  07/01/2022   Procedure: POLYPECTOMY;  Surgeon: Corbin Ade, MD;  [facility];  Service: Endoscopy;;   POSTERIOR CERVICAL FUSION/FORAMINOTOMY N/A 08/07/2020   Procedure: POSTERIOR CERVICAL LAMINECTOMY FOR MYELOPATHY;  Surgeon: Donalee Citrin, MD;  Location: [facility] OR;  Service: Neurosurgery;  Laterality: N/A;   POSTERIOR CERVICAL FUSION/FORAMINOTOMY N/A 03/10/2021   Procedure: Posterior cervical fusion with lateral mass fixation Cervical one - cervical four;  Surgeon: Donalee Citrin, MD;  Location: [facility] OR;  Service: Neurosurgery;  Laterality: N/A;   POSTERIOR CERVICAL LAMINECTOMY  08/07/2020   w/ Dr. Wynetta Emery   tonsillectomy     TONSILLECTOMY     TUBAL LIGATION      OB History   No obstetric history on file.      Home Medications    Prior to Admission medications   Medication Sig Start Date End Date Taking? Authorizing Provider  azithromycin (ZITHROMAX) 250 MG tablet Take (2) tablets by mouth on day 1, then take (1) tablet by mouth on days 2-5. 01/12/23  Yes Valentino Nose, NP  benzonatate (TESSALON) 100 MG capsule Take 1 capsule (100 mg total) by mouth 3 (three) times daily as needed for cough. Do not take with alcohol or while driving or operating heavy machinery.  May cause drowsiness. 01/12/23  Yes Valentino Nose, NP  predniSONE (DELTASONE) 20 MG tablet Take 2 tablets (40 mg total) by mouth daily with breakfast for 5 days. 01/12/23 01/17/23 Yes Cathlean Marseilles A, NP  albuterol (VENTOLIN HFA) 108 (90 Base) MCG/ACT inhaler INHALE 2 PUFFS BY MOUTH EVERY 6 HOURS AS NEEDED FOR WHEEZE OR SHORTNESS OF BREATH 11/06/22   Dettinger, Elige Radon, MD  AMITIZA 24 MCG  capsule TAKE 1 CAPSULE (24 MCG TOTAL) BY MOUTH 2 (TWO) TIMES DAILY WITH A MEAL. 09/07/22   [name], NP  aspirin EC 81 MG tablet Take 81 mg by mouth daily. Swallow whole.    [provider]  atorvastatin (LIPITOR) 40 MG tablet TAKE 1 TABLET BY MOUTH EVERY DAY 10/26/22   Dettinger, Elige Radon, MD  baclofen (LIORESAL) 10 MG tablet Take 10 mg by mouth 3 (three) times daily as needed for muscle spasms. 06/12/22   [provider]  budesonide-formoterol (SYMBICORT) 160-4.5 MCG/ACT inhaler Inhale 2 puffs into the lungs 2 (two) times daily. 04/10/22   Hunsucker, Lesia Sago, MD  buprenorphine (BUTRANS) 5 MCG/HR PTWK Place 1 patch onto the skin once a week.    [provider]  cholecalciferol (VITAMIN D3) 25 MCG (1000  UNIT) tablet Take 1,000 Units by mouth daily.    [provider]  conjugated estrogens (PREMARIN) vaginal cream Pea-sized amount (0.5g) twice weekly 11/06/22   Myna Hidalgo, DO  Cyanocobalamin (B-12 PO) Take 1 tablet by mouth daily.    [provider]  cyclobenzaprine (FLEXERIL) 10 MG tablet TAKE 1 TABLET BY MOUTH AT BEDTIME AS NEEDED FOR MUSCLE SPASMS 10/05/22   Dettinger, Elige Radon, MD  DULoxetine (CYMBALTA) 60 MG capsule Take 1 capsule (60 mg total) by mouth daily. TAKE 1 CAPSULE BY MOUTH EVERY DAY 06/26/22   Dettinger, Elige Radon, MD  enalapril (VASOTEC) 5 MG tablet TAKE 1 TABLET (5 MG TOTAL) BY MOUTH DAILY. 11/06/22   Dettinger, Elige Radon, MD  esomeprazole (NEXIUM) 40 MG capsule Take 1 capsule (40 mg total) by mouth daily. 01/08/22   [name], NP  hydrOXYzine (ATARAX) 10 MG tablet TAKE 0.5 TABLETS (5 MG TOTAL) BY MOUTH 2 (TWO) TIMES DAILY AS NEEDED FOR ITCHING. 09/07/22   Mahon, Frederik Schmidt, NP  ipratropium-albuterol (DUONEB) 0.5-2.5 (3) MG/3ML SOLN Take 3 mLs by nebulization every 6 (six) hours as needed. 04/10/22   Hunsucker, Lesia Sago, MD  Multiple Vitamins-Minerals (HAIR SKIN NAILS PO) Take 3 tablets by mouth daily.    [provider]   Multiple Vitamins-Minerals (MULTIVITAMIN WITH MINERALS) tablet Take 1 tablet by mouth daily.    [provider]  mupirocin ointment (BACTROBAN) 2 % Apply to both nares bid for 5 days 07/08/22   Rourk, Gerrit Friends, MD  nicotine (NICODERM CQ - DOSED IN MG/24 HOURS) 21 mg/24hr patch Place 21 mg onto the skin daily as needed (smoking cessation).    [provider]  nitrofurantoin (MACRODANTIN) 100 MG capsule Take 100 mg by mouth daily.    [provider]  oxyCODONE (OXYCONTIN) 10 mg 12 hr tablet Take 1 tablet (10 mg total) by mouth 2 (two) times daily. 03/14/21   Donalee Citrin, MD  pregabalin (LYRICA) 150 MG capsule Take 1 capsule (150 mg total) by mouth 3 (three) times daily. 06/26/20   Dettinger, Elige Radon, MD  ramelteon (ROZEREM) 8 MG tablet TAKE 1 TABLET BY MOUTH AT BEDTIME. 07/14/22   Hunsucker, Lesia Sago, MD  ramelteon (ROZEREM) 8 MG tablet TAKE 1 TABLET BY MOUTH AT BEDTIME. 07/16/22   Hunsucker, Lesia Sago, MD  sitaGLIPtin-metformin (JANUMET) 50-1000 MG tablet TAKE 1 TABLET BY MOUTH TWICE A DAY WITH MEALS 11/27/22   Dettinger, Elige Radon, MD  Tiotropium Bromide Monohydrate (SPIRIVA RESPIMAT) 2.5 MCG/ACT AERS Inhale 2 puffs into the lungs daily. 06/22/22   Hunsucker, Lesia Sago, MD    Family History Family History  Problem Relation Age of Onset   Ulcers Mother    Sleep apnea Mother    Migraines Mother    Cancer Mother        uterine   Arthritis Mother    Hypertension Mother    Heart disease Mother    Depression Mother    Hyperlipidemia Mother    Colon cancer Mother    Arrhythmia Son     Social History Social History   Tobacco Use   Smoking status: Every Day    Packs/day: 2.00    Years: 30.00    Additional pack years: 0.00    Total pack years: 60.00    Types: Cigarettes   Smokeless tobacco: Never   Tobacco comments:    Pt states she is down to 1 ppd. 06/22/2022   Vaping Use   Vaping Use: Never used  Substance  Use Topics   Alcohol use: No   Drug use: No      Allergies   Avelox [moxifloxacin], Iohexol, and Shellfish allergy   Review of Systems Review of Systems Per HPI  Physical Exam Triage Vital Signs ED Triage Vitals  Enc Vitals Group     BP 01/12/23 1242 95/61     Pulse Rate 01/12/23 1242 (!) 110     Resp 01/12/23 1242 18     Temp 01/12/23 1242 98.2 F (36.8 C)     Temp Source 01/12/23 1242 Oral     SpO2 01/12/23 1242 (!) 88 %     Weight --      Height --      Head Circumference --      Peak Flow --      Pain Score 01/12/23 1245 6     Pain Loc --      Pain Edu? --      Excl. in GC? --    No data found.  Updated Vital Signs BP 95/61 (BP Location: Right Arm)   Pulse (!) 110   Temp 98.2 F (36.8 C) (Oral)   Resp 18   SpO2 92%   Visual Acuity Right Eye Distance:   Left Eye Distance:   Bilateral Distance:    Right Eye Near:   Left Eye Near:    Bilateral Near:     Physical Exam Vitals and nursing note reviewed.  Constitutional:      General: She is not in acute distress.    Appearance: Normal appearance. She is not ill-appearing or toxic-appearing.  HENT:     Head: Normocephalic and atraumatic.     Right Ear: Tympanic membrane, ear canal and external ear normal.     Left Ear: Tympanic membrane, ear canal and external ear normal.     Nose: Congestion and rhinorrhea present.     Mouth/Throat:     Mouth: Mucous membranes are moist.     Pharynx: Oropharynx is clear. No oropharyngeal exudate or posterior oropharyngeal erythema.  Eyes:     General: No scleral icterus.    Extraocular Movements: Extraocular movements intact.  Cardiovascular:     Rate and Rhythm: Normal rate and regular rhythm.  Pulmonary:     Effort: Pulmonary effort is normal. No respiratory distress.     Breath sounds: Decreased air movement present. Wheezing present. No rhonchi or rales.  Musculoskeletal:     Cervical back: Normal range of motion and neck supple.  Lymphadenopathy:     Cervical: No cervical adenopathy.  Skin:     General: Skin is warm and dry.     Coloration: Skin is not jaundiced or pale.     Findings: No erythema or rash.  Neurological:     Mental Status: She is alert and oriented to person, place, and time.  Psychiatric:        Behavior: Behavior is cooperative.      UC Treatments / Results  Labs (all labs ordered are listed, but only abnormal results are displayed) Labs Reviewed - No data to display  EKG   Radiology No results found.  Procedures Procedures (including critical care time)  Medications Ordered in UC Medications  ipratropium-albuterol (DUONEB) 0.5-2.5 (3) MG/3ML nebulizer solution 3 mL (3 mLs Nebulization Given 01/12/23 1315)  methylPREDNISolone acetate (DEPO-MEDROL) injection 40 mg (40 mg Intramuscular Given 01/12/23 1334)    Initial Impression / Assessment and Plan / UC Course  I have reviewed the triage vital signs and  the nursing notes.  Pertinent labs & imaging results that were available during my care of the patient were reviewed by me and considered in my medical decision making (see chart for details).   Patient is chronically ill-appearing, however normotensive, afebrile, not tachypneic, and oxygenating near her baseline on 3 L nasal cannula.  She is mildly tachycardic in triage which also appears to be near her baseline.    1. COPD exacerbation (HCC) DuoNeb and Depo-Medrol given in urgent care today with improvement subjectively as well as improvement in air movement bilaterally; however wheezing remains Suspect viral etiology given family members are sick with similar symptoms, however patient is out of window for viral testing/treatment, therefore COVID-19 testing deferred Will treat patient with oral prednisone starting tomorrow, azithromycin with loading dose today, continue Mucinex and pulmonary hygiene Start Tessalon Perles but use sparingly for dry cough Strict ER precautions discussed with patient  The patient was given the opportunity to ask  questions.  All questions answered to their satisfaction.  The patient is in agreement to this plan.    Final Clinical Impressions(s) / UC Diagnoses   Final diagnoses:  COPD exacerbation [facility])     Discharge Instructions      You are having an exacerbation of your chronic breathing condition.  Symptoms should improve over the next week to 10 days.  If you develop worsening chest pain or shortness of breath, go to the emergency room.  We have given you a DuoNeb and Depo-Medrol shot today in urgent care to help with lung inflammation.  Please continue the breathing treatments at home every 4-6 hours as needed for wheezing/shortness of breath.  Start the azithromycin and take it as prescribed.  Start the prednisone tomorrow morning.  You can use a cough Perles sparingly as needed to help with dry cough.  Some things that can make you feel better are: - Increased rest - Increasing fluid with water/sugar free electrolytes - Acetaminophen and ibuprofen as needed for fever/pain - Salt water gargling, chloraseptic spray and throat lozenges for sore throat - OTC guaifenesin (Mucinex) 600 mg twice daily for congestion - Saline sinus flushes or a neti pot - Humidifying the air -Tessalon Perles during as needed for dry cough     ED Prescriptions     Medication Sig Dispense Auth. Provider   benzonatate (TESSALON) 100 MG capsule Take 1 capsule (100 mg total) by mouth 3 (three) times daily as needed for cough. Do not take with alcohol or while driving or operating heavy machinery.  May cause drowsiness. 21 capsule Cathlean Marseilles A, NP   predniSONE (DELTASONE) 20 MG tablet Take 2 tablets (40 mg total) by mouth daily with breakfast for 5 days. 10 tablet Cathlean Marseilles A, NP   azithromycin (ZITHROMAX) 250 MG tablet Take (2) tablets by mouth on day 1, then take (1) tablet by mouth on days 2-5. 6 tablet Valentino Nose, NP      PDMP not reviewed this encounter.   Valentino Nose,  NP 01/12/23 1424

## 2023-01-12 NOTE — Discharge Instructions (Signed)
You are having an exacerbation of your chronic breathing condition.  Symptoms should improve over the next week to 10 days.  If you develop worsening chest pain or shortness of breath, go to the emergency room.  We have given you a DuoNeb and Depo-Medrol shot today in urgent care to help with lung inflammation.  Please continue the breathing treatments at home every 4-6 hours as needed for wheezing/shortness of breath.  Start the azithromycin and take it as prescribed.  Start the prednisone tomorrow morning.  You can use a cough Perles sparingly as needed to help with dry cough.  Some things that can make you feel better are: - Increased rest - Increasing fluid with water/sugar free electrolytes - Acetaminophen and ibuprofen as needed for fever/pain - Salt water gargling, chloraseptic spray and throat lozenges for sore throat - OTC guaifenesin (Mucinex) 600 mg twice daily for congestion - Saline sinus flushes or a neti pot - Humidifying the air -Tessalon Perles during as needed for dry cough

## 2023-01-20 DIAGNOSIS — J449 Chronic obstructive pulmonary disease, unspecified: Secondary | ICD-10-CM | POA: Diagnosis not present

## 2023-01-25 DIAGNOSIS — R0902 Hypoxemia: Secondary | ICD-10-CM | POA: Diagnosis not present

## 2023-02-05 DIAGNOSIS — R030 Elevated blood-pressure reading, without diagnosis of hypertension: Secondary | ICD-10-CM | POA: Diagnosis not present

## 2023-02-05 DIAGNOSIS — Z6822 Body mass index (BMI) 22.0-22.9, adult: Secondary | ICD-10-CM | POA: Diagnosis not present

## 2023-02-05 DIAGNOSIS — J449 Chronic obstructive pulmonary disease, unspecified: Secondary | ICD-10-CM | POA: Diagnosis not present

## 2023-02-05 DIAGNOSIS — F17200 Nicotine dependence, unspecified, uncomplicated: Secondary | ICD-10-CM | POA: Diagnosis not present

## 2023-02-05 DIAGNOSIS — F17210 Nicotine dependence, cigarettes, uncomplicated: Secondary | ICD-10-CM | POA: Diagnosis not present

## 2023-02-05 DIAGNOSIS — R110 Nausea: Secondary | ICD-10-CM | POA: Diagnosis not present

## 2023-02-05 DIAGNOSIS — G950 Syringomyelia and syringobulbia: Secondary | ICD-10-CM | POA: Diagnosis not present

## 2023-02-05 DIAGNOSIS — E119 Type 2 diabetes mellitus without complications: Secondary | ICD-10-CM | POA: Diagnosis not present

## 2023-02-05 DIAGNOSIS — G894 Chronic pain syndrome: Secondary | ICD-10-CM | POA: Diagnosis not present

## 2023-02-05 DIAGNOSIS — M4802 Spinal stenosis, cervical region: Secondary | ICD-10-CM | POA: Diagnosis not present

## 2023-02-19 DIAGNOSIS — J449 Chronic obstructive pulmonary disease, unspecified: Secondary | ICD-10-CM | POA: Diagnosis not present

## 2023-02-19 DIAGNOSIS — J9601 Acute respiratory failure with hypoxia: Secondary | ICD-10-CM | POA: Diagnosis not present

## 2023-02-22 ENCOUNTER — Other Ambulatory Visit: Payer: Self-pay | Admitting: Family Medicine

## 2023-02-22 DIAGNOSIS — I10 Essential (primary) hypertension: Secondary | ICD-10-CM

## 2023-02-24 ENCOUNTER — Other Ambulatory Visit: Payer: Self-pay | Admitting: Family Medicine

## 2023-02-24 DIAGNOSIS — E1169 Type 2 diabetes mellitus with other specified complication: Secondary | ICD-10-CM

## 2023-02-25 DIAGNOSIS — R0902 Hypoxemia: Secondary | ICD-10-CM | POA: Diagnosis not present

## 2023-03-05 DIAGNOSIS — G950 Syringomyelia and syringobulbia: Secondary | ICD-10-CM | POA: Diagnosis not present

## 2023-03-05 DIAGNOSIS — G894 Chronic pain syndrome: Secondary | ICD-10-CM | POA: Diagnosis not present

## 2023-03-05 DIAGNOSIS — R030 Elevated blood-pressure reading, without diagnosis of hypertension: Secondary | ICD-10-CM | POA: Diagnosis not present

## 2023-03-05 DIAGNOSIS — J449 Chronic obstructive pulmonary disease, unspecified: Secondary | ICD-10-CM | POA: Diagnosis not present

## 2023-03-05 DIAGNOSIS — F17200 Nicotine dependence, unspecified, uncomplicated: Secondary | ICD-10-CM | POA: Diagnosis not present

## 2023-03-05 DIAGNOSIS — F17210 Nicotine dependence, cigarettes, uncomplicated: Secondary | ICD-10-CM | POA: Diagnosis not present

## 2023-03-05 DIAGNOSIS — E119 Type 2 diabetes mellitus without complications: Secondary | ICD-10-CM | POA: Diagnosis not present

## 2023-03-05 DIAGNOSIS — Z6824 Body mass index (BMI) 24.0-24.9, adult: Secondary | ICD-10-CM | POA: Diagnosis not present

## 2023-03-05 DIAGNOSIS — Z79899 Other long term (current) drug therapy: Secondary | ICD-10-CM | POA: Diagnosis not present

## 2023-03-05 DIAGNOSIS — R110 Nausea: Secondary | ICD-10-CM | POA: Diagnosis not present

## 2023-03-05 DIAGNOSIS — M4802 Spinal stenosis, cervical region: Secondary | ICD-10-CM | POA: Diagnosis not present

## 2023-03-09 ENCOUNTER — Other Ambulatory Visit (HOSPITAL_COMMUNITY): Payer: Self-pay | Admitting: Family Medicine

## 2023-03-09 DIAGNOSIS — I714 Abdominal aortic aneurysm, without rupture, unspecified: Secondary | ICD-10-CM

## 2023-03-22 DIAGNOSIS — J9601 Acute respiratory failure with hypoxia: Secondary | ICD-10-CM | POA: Diagnosis not present

## 2023-03-22 DIAGNOSIS — J449 Chronic obstructive pulmonary disease, unspecified: Secondary | ICD-10-CM | POA: Diagnosis not present

## 2023-03-25 ENCOUNTER — Other Ambulatory Visit: Payer: Self-pay | Admitting: Family Medicine

## 2023-03-25 DIAGNOSIS — G950 Syringomyelia and syringobulbia: Secondary | ICD-10-CM

## 2023-03-28 DIAGNOSIS — R0902 Hypoxemia: Secondary | ICD-10-CM | POA: Diagnosis not present

## 2023-04-07 ENCOUNTER — Other Ambulatory Visit: Payer: Self-pay | Admitting: Family Medicine

## 2023-04-07 DIAGNOSIS — E782 Mixed hyperlipidemia: Secondary | ICD-10-CM

## 2023-04-08 NOTE — Patient Instructions (Signed)
Our records indicate that you are due for your screening mammogram.  Please call the imaging center that does your yearly mammograms to make an appointment for a mammogram at your earliest convenience. Our office also has a mobile unit through the [facility] that comes to our location. Please call our office if you would like to make an appointment.   

## 2023-04-09 DIAGNOSIS — G894 Chronic pain syndrome: Secondary | ICD-10-CM | POA: Diagnosis not present

## 2023-04-09 DIAGNOSIS — Z6824 Body mass index (BMI) 24.0-24.9, adult: Secondary | ICD-10-CM | POA: Diagnosis not present

## 2023-04-09 DIAGNOSIS — Z79899 Other long term (current) drug therapy: Secondary | ICD-10-CM | POA: Diagnosis not present

## 2023-04-09 DIAGNOSIS — M4802 Spinal stenosis, cervical region: Secondary | ICD-10-CM | POA: Diagnosis not present

## 2023-04-09 DIAGNOSIS — M545 Low back pain, unspecified: Secondary | ICD-10-CM | POA: Diagnosis not present

## 2023-04-09 DIAGNOSIS — E119 Type 2 diabetes mellitus without complications: Secondary | ICD-10-CM | POA: Diagnosis not present

## 2023-04-09 DIAGNOSIS — J449 Chronic obstructive pulmonary disease, unspecified: Secondary | ICD-10-CM | POA: Diagnosis not present

## 2023-04-09 DIAGNOSIS — R030 Elevated blood-pressure reading, without diagnosis of hypertension: Secondary | ICD-10-CM | POA: Diagnosis not present

## 2023-04-09 DIAGNOSIS — G950 Syringomyelia and syringobulbia: Secondary | ICD-10-CM | POA: Diagnosis not present

## 2023-04-09 DIAGNOSIS — G629 Polyneuropathy, unspecified: Secondary | ICD-10-CM | POA: Diagnosis not present

## 2023-04-09 DIAGNOSIS — M546 Pain in thoracic spine: Secondary | ICD-10-CM | POA: Diagnosis not present

## 2023-04-09 DIAGNOSIS — R110 Nausea: Secondary | ICD-10-CM | POA: Diagnosis not present

## 2023-04-13 DIAGNOSIS — Z79899 Other long term (current) drug therapy: Secondary | ICD-10-CM | POA: Diagnosis not present

## 2023-04-16 ENCOUNTER — Encounter: Payer: Self-pay | Admitting: Family Medicine

## 2023-04-16 ENCOUNTER — Ambulatory Visit: Payer: Medicaid Other | Admitting: Family Medicine

## 2023-04-16 VITALS — BP 131/84 | HR 81 | Temp 98.6°F | Ht 62.0 in | Wt 144.2 lb

## 2023-04-16 DIAGNOSIS — E782 Mixed hyperlipidemia: Secondary | ICD-10-CM | POA: Diagnosis not present

## 2023-04-16 DIAGNOSIS — Z7984 Long term (current) use of oral hypoglycemic drugs: Secondary | ICD-10-CM | POA: Diagnosis not present

## 2023-04-16 DIAGNOSIS — J441 Chronic obstructive pulmonary disease with (acute) exacerbation: Secondary | ICD-10-CM

## 2023-04-16 DIAGNOSIS — I10 Essential (primary) hypertension: Secondary | ICD-10-CM

## 2023-04-16 DIAGNOSIS — E1142 Type 2 diabetes mellitus with diabetic polyneuropathy: Secondary | ICD-10-CM

## 2023-04-16 DIAGNOSIS — E1169 Type 2 diabetes mellitus with other specified complication: Secondary | ICD-10-CM

## 2023-04-16 MED ORDER — ATORVASTATIN CALCIUM 40 MG PO TABS
40.0000 mg | ORAL_TABLET | Freq: Every day | ORAL | 3 refills | Status: DC
Start: 2023-04-16 — End: 2024-02-25

## 2023-04-16 MED ORDER — ALBUTEROL SULFATE HFA 108 (90 BASE) MCG/ACT IN AERS
INHALATION_SPRAY | RESPIRATORY_TRACT | 3 refills | Status: DC
Start: 2023-04-16 — End: 2024-02-25

## 2023-04-16 MED ORDER — ENALAPRIL MALEATE 5 MG PO TABS
5.0000 mg | ORAL_TABLET | Freq: Every day | ORAL | 3 refills | Status: DC
Start: 2023-04-16 — End: 2024-02-25

## 2023-04-16 MED ORDER — JANUMET 50-1000 MG PO TABS
ORAL_TABLET | ORAL | 3 refills | Status: DC
Start: 2023-04-16 — End: 2024-02-25

## 2023-04-16 NOTE — Progress Notes (Signed)
BP 131/84   Pulse 81   Temp 98.6 F (37 C)   Ht 5\' 2"  (1.575 m)   Wt 144 lb 3.2 oz (65.4 kg)   SpO2 (!) 85%   BMI 26.37 kg/m    Subjective:   Patient ID: Margaret Vaughn, female    DOB: [birth year], 62 y.o.   MRN: [id number]  HPI: Margaret Vaughn is a 62 y.o. female presenting on 04/16/2023 for Medical Management of Chronic Issues (3 month)   HPI Hyperlipidemia Patient is coming in for recheck of his hyperlipidemia. The patient is currently taking Lipitor. They deny any issues with myalgias or history of liver damage from it. They deny any focal numbness or weakness or chest pain.   Type 2 diabetes mellitus Patient comes in today for recheck of his diabetes. Patient has been currently taking Janumet. Patient is currently on an ACE inhibitor/ARB. Patient has not seen an ophthalmologist this year. Patient denies any new issues with their feet. The symptom started onset as an adult hypertension and hyperlipidemia ARE RELATED TO DM   Hypertension Patient is currently on enalapril, and their blood pressure today is 131/84. Patient denies any lightheadedness or dizziness. Patient denies headaches, blurred vision, chest pains, shortness of breath, or weakness. Denies any side effects from medication and is content with current medication.   Relevant past medical, surgical, family and social history reviewed and updated as indicated. Interim medical history since our last visit reviewed. Allergies and medications reviewed and updated.  Review of Systems  Constitutional:  Negative for chills and fever.  Eyes:  Negative for visual disturbance.  Respiratory:  Negative for chest tightness and shortness of breath.   Cardiovascular:  Negative for chest pain and leg swelling.  Genitourinary:  Negative for difficulty urinating and dysuria.  Musculoskeletal:  Negative for back pain and gait problem.  Skin:  Negative for rash.  Neurological:  Negative for dizziness, light-headedness and  headaches.  Psychiatric/Behavioral:  Positive for sleep disturbance. Negative for agitation and behavioral problems.   All other systems reviewed and are negative.   Per HPI unless specifically indicated above   Allergies as of 04/16/2023       Reactions   Avelox [moxifloxacin] Anaphylaxis   Iohexol Anaphylaxis   PT.GIVEN OMNI 300 FOR CT ANGIO WAS GIVEN 50 MG.BENADRYL PRIOR TO SCAN HAD NO PROBLEMS   Shellfish Allergy Hives        Medication List        Accurate as of April 16, 2023  4:21 PM. If you have any questions, ask your nurse or doctor.          STOP taking these medications    azithromycin 250 MG tablet Commonly known as: ZITHROMAX Stopped by: Elige Radon Taylor Spilde       TAKE these medications    albuterol 108 (90 Base) MCG/ACT inhaler Commonly known as: Ventolin HFA INHALE 2 PUFFS BY MOUTH EVERY 6 HOURS AS NEEDED FOR WHEEZE OR SHORTNESS OF BREATH   Amitiza 24 MCG capsule Generic drug: lubiprostone TAKE 1 CAPSULE (24 MCG TOTAL) BY MOUTH 2 (TWO) TIMES DAILY WITH A MEAL.   aspirin EC 81 MG tablet Take 81 mg by mouth daily. Swallow whole.   atorvastatin 40 MG tablet Commonly known as: LIPITOR Take 1 tablet (40 mg total) by mouth daily.   B-12 PO Take 1 tablet by mouth daily.   baclofen 10 MG tablet Commonly known as: LIORESAL Take 10 mg by mouth 3 (three) times daily as  needed for muscle spasms.   benzonatate 100 MG capsule Commonly known as: TESSALON Take 1 capsule (100 mg total) by mouth 3 (three) times daily as needed for cough. Do not take with alcohol or while driving or operating heavy machinery.  May cause drowsiness.   budesonide-formoterol 160-4.5 MCG/ACT inhaler Commonly known as: Symbicort Inhale 2 puffs into the lungs 2 (two) times daily.   Butrans 5 MCG/HR Ptwk Generic drug: buprenorphine Place 1 patch onto the skin once a week.   cholecalciferol 25 MCG (1000 UNIT) tablet Commonly known as: VITAMIN D3 Take 1,000 Units by  mouth daily.   cyclobenzaprine 10 MG tablet Commonly known as: FLEXERIL TAKE 1 TABLET BY MOUTH AT BEDTIME AS NEEDED FOR MUSCLE SPASMS.   DULoxetine 60 MG capsule Commonly known as: CYMBALTA Take 1 capsule (60 mg total) by mouth daily. TAKE 1 CAPSULE BY MOUTH EVERY DAY   enalapril 5 MG tablet Commonly known as: VASOTEC Take 1 tablet (5 mg total) by mouth daily.   esomeprazole 40 MG capsule Commonly known as: NEXIUM Take 1 capsule (40 mg total) by mouth daily.   HAIR SKIN NAILS PO Take 3 tablets by mouth daily.   multivitamin with minerals tablet Take 1 tablet by mouth daily.   hydrOXYzine 10 MG tablet Commonly known as: ATARAX TAKE 0.5 TABLETS (5 MG TOTAL) BY MOUTH 2 (TWO) TIMES DAILY AS NEEDED FOR ITCHING.   ipratropium-albuterol 0.5-2.5 (3) MG/3ML Soln Commonly known as: DUONEB Take 3 mLs by nebulization every 6 (six) hours as needed.   Janumet 50-1000 MG tablet Generic drug: sitaGLIPtin-metformin TAKE 1 TABLET BY MOUTH TWICE A DAY WITH FOOD   mupirocin ointment 2 % Commonly known as: BACTROBAN Apply to both nares bid for 5 days   nicotine 21 mg/24hr patch Commonly known as: NICODERM CQ - dosed in mg/24 hours Place 21 mg onto the skin daily as needed (smoking cessation).   nitrofurantoin 100 MG capsule Commonly known as: MACRODANTIN Take 100 mg by mouth daily.   oxyCODONE 10 mg 12 hr tablet Commonly known as: OXYCONTIN Take 1 tablet (10 mg total) by mouth 2 (two) times daily.   pregabalin 150 MG capsule Commonly known as: LYRICA Take 1 capsule (150 mg total) by mouth 3 (three) times daily.   Premarin vaginal cream Generic drug: conjugated estrogens Pea-sized amount (0.5g) twice weekly   ramelteon 8 MG tablet Commonly known as: ROZEREM TAKE 1 TABLET BY MOUTH AT BEDTIME.   ramelteon 8 MG tablet Commonly known as: ROZEREM TAKE 1 TABLET BY MOUTH AT BEDTIME.   Spiriva Respimat 2.5 MCG/ACT Aers Generic drug: Tiotropium Bromide Monohydrate Inhale 2  puffs into the lungs daily.         Objective:   BP 131/84   Pulse 81   Temp 98.6 F (37 C)   Ht 5\' 2"  (1.575 m)   Wt 144 lb 3.2 oz (65.4 kg)   SpO2 (!) 85%   BMI 26.37 kg/m   Wt Readings from Last 3 Encounters:  04/16/23 144 lb 3.2 oz (65.4 kg)  01/01/23 137 lb (62.1 kg)  11/06/22 138 lb (62.6 kg)    Physical Exam Vitals and nursing note reviewed.  Constitutional:      General: She is not in acute distress.    Appearance: She is well-developed. She is not diaphoretic.  Eyes:     Conjunctiva/sclera: Conjunctivae normal.  Cardiovascular:     Rate and Rhythm: Normal rate and regular rhythm.     Heart sounds: Normal heart  sounds. No murmur heard. Pulmonary:     Effort: Pulmonary effort is normal. No respiratory distress.     Breath sounds: Normal breath sounds. No wheezing.  Musculoskeletal:        General: No tenderness. Normal range of motion.  Skin:    General: Skin is warm and dry.     Findings: No rash.  Neurological:     Mental Status: She is alert and oriented to person, place, and time.     Coordination: Coordination normal.  Psychiatric:        Behavior: Behavior normal.       Assessment & Plan:   Problem List Items Addressed This Visit       Cardiovascular and Mediastinum   Essential hypertension, benign   Relevant Medications   atorvastatin (LIPITOR) 40 MG tablet   enalapril (VASOTEC) 5 MG tablet   Other Relevant Orders   CBC with Differential/Platelet   CMP14+EGFR     Endocrine   Mixed diabetic hyperlipidemia associated with type 2 diabetes mellitus (HCC) - Primary   Relevant Medications   atorvastatin (LIPITOR) 40 MG tablet   enalapril (VASOTEC) 5 MG tablet   sitaGLIPtin-metformin (JANUMET) 50-1000 MG tablet   Other Relevant Orders   Bayer DCA Hb A1c Waived   CBC with Differential/Platelet   Type 2 diabetes mellitus with diabetic polyneuropathy, without long-term current use of insulin (HCC)   Relevant Medications   atorvastatin  (LIPITOR) 40 MG tablet   enalapril (VASOTEC) 5 MG tablet   sitaGLIPtin-metformin (JANUMET) 50-1000 MG tablet   Other Relevant Orders   Bayer DCA Hb A1c Waived     Other   Hyperlipidemia   Relevant Medications   atorvastatin (LIPITOR) 40 MG tablet   enalapril (VASOTEC) 5 MG tablet   Other Relevant Orders   Lipid panel   Other Visit Diagnoses     COPD exacerbation (HCC)       Relevant Medications   albuterol (VENTOLIN HFA) 108 (90 Base) MCG/ACT inhaler   Type 2 diabetes mellitus with other specified complication, without long-term current use of insulin (HCC)       Relevant Medications   atorvastatin (LIPITOR) 40 MG tablet   enalapril (VASOTEC) 5 MG tablet   sitaGLIPtin-metformin (JANUMET) 50-1000 MG tablet       Will do blood work on the way out, continue current medicine.  She is having trouble with sleep but recommended she talk to her sleep doctor for that.  She does see a pulmonologist for lung cancer screening. Follow up plan: Return in about 3 months (around 07/16/2023), or if symptoms worsen or fail to improve, for Diabetes and hypertension and cholesterol.  Counseling provided for all of the vaccine components Orders Placed This Encounter  Procedures   Bayer DCA Hb A1c Waived   CBC with Differential/Platelet   CMP14+EGFR   Lipid panel    Arville Care, MD [facility] 04/16/2023, 4:21 PM

## 2023-04-17 LAB — CMP14+EGFR
ALT: 17 IU/L (ref 0–32)
AST: 14 IU/L (ref 0–40)
Albumin: 3.9 g/dL (ref 3.9–4.9)
Alkaline Phosphatase: 97 IU/L (ref 44–121)
BUN/Creatinine Ratio: 12 (ref 12–28)
BUN: 8 mg/dL (ref 8–27)
Bilirubin Total: <0.2 mg/dL (ref 0.0–1.2)
CO2: 30 mmol/L — ABNORMAL HIGH (ref 20–29)
Calcium: 9.7 mg/dL (ref 8.7–10.3)
Chloride: 101 mmol/L (ref 96–106)
Creatinine, Ser: 0.67 mg/dL (ref 0.57–1.00)
Globulin, Total: 2.3 g/dL (ref 1.5–4.5)
Glucose: 95 mg/dL (ref 70–99)
Potassium: 5.3 mmol/L — ABNORMAL HIGH (ref 3.5–5.2)
Sodium: 144 mmol/L (ref 134–144)
Total Protein: 6.2 g/dL (ref 6.0–8.5)
eGFR: 99 mL/min/{1.73_m2} (ref >59)

## 2023-04-17 LAB — CBC WITH DIFFERENTIAL/PLATELET
Basophils Absolute: 0 10*3/uL (ref 0.0–0.2)
Basos: 0 %
EOS (ABSOLUTE): 0.2 10*3/uL (ref 0.0–0.4)
Eos: 2 %
Hematocrit: 43.2 % (ref 34.0–46.6)
Hemoglobin: 13.8 g/dL (ref 11.1–15.9)
Immature Grans (Abs): 0 10*3/uL (ref 0.0–0.1)
Immature Granulocytes: 0 %
Lymphocytes Absolute: 2.1 10*3/uL (ref 0.7–3.1)
Lymphs: 22 %
MCH: 29.6 pg (ref 26.6–33.0)
MCHC: 31.9 g/dL (ref 31.5–35.7)
MCV: 93 fL (ref 79–97)
Monocytes Absolute: 0.9 10*3/uL (ref 0.1–0.9)
Monocytes: 10 %
Neutrophils Absolute: 6.3 10*3/uL (ref 1.4–7.0)
Neutrophils: 66 %
Platelets: 328 10*3/uL (ref 150–450)
RBC: 4.67 x10E6/uL (ref 3.77–5.28)
RDW: 14.4 % (ref 11.7–15.4)
WBC: 9.5 10*3/uL (ref 3.4–10.8)

## 2023-04-17 LAB — LIPID PANEL
Chol/HDL Ratio: 2.2 ratio (ref 0.0–4.4)
Cholesterol, Total: 127 mg/dL (ref 100–199)
HDL: 57 mg/dL (ref >39)
LDL Chol Calc (NIH): 52 mg/dL (ref 0–99)
Triglycerides: 95 mg/dL (ref 0–149)
VLDL Cholesterol Cal: 18 mg/dL (ref 5–40)

## 2023-04-19 LAB — BAYER DCA HB A1C WAIVED: HB A1C (BAYER DCA - WAIVED): 5.5 % (ref 4.8–5.6)

## 2023-04-19 LAB — HM DIABETES EYE EXAM

## 2023-04-21 ENCOUNTER — Ambulatory Visit: Payer: Medicaid Other

## 2023-04-21 DIAGNOSIS — Z23 Encounter for immunization: Secondary | ICD-10-CM | POA: Diagnosis not present

## 2023-04-22 DIAGNOSIS — J9601 Acute respiratory failure with hypoxia: Secondary | ICD-10-CM | POA: Diagnosis not present

## 2023-04-22 DIAGNOSIS — H5213 Myopia, bilateral: Secondary | ICD-10-CM | POA: Diagnosis not present

## 2023-04-22 DIAGNOSIS — J449 Chronic obstructive pulmonary disease, unspecified: Secondary | ICD-10-CM | POA: Diagnosis not present

## 2023-04-27 DIAGNOSIS — J449 Chronic obstructive pulmonary disease, unspecified: Secondary | ICD-10-CM | POA: Diagnosis not present

## 2023-05-05 ENCOUNTER — Other Ambulatory Visit: Payer: Self-pay | Admitting: Pulmonary Disease

## 2023-05-07 DIAGNOSIS — Z79899 Other long term (current) drug therapy: Secondary | ICD-10-CM | POA: Diagnosis not present

## 2023-05-07 DIAGNOSIS — R030 Elevated blood-pressure reading, without diagnosis of hypertension: Secondary | ICD-10-CM | POA: Diagnosis not present

## 2023-05-07 DIAGNOSIS — R110 Nausea: Secondary | ICD-10-CM | POA: Diagnosis not present

## 2023-05-07 DIAGNOSIS — Z6824 Body mass index (BMI) 24.0-24.9, adult: Secondary | ICD-10-CM | POA: Diagnosis not present

## 2023-05-07 DIAGNOSIS — G629 Polyneuropathy, unspecified: Secondary | ICD-10-CM | POA: Diagnosis not present

## 2023-05-07 DIAGNOSIS — G950 Syringomyelia and syringobulbia: Secondary | ICD-10-CM | POA: Diagnosis not present

## 2023-05-07 DIAGNOSIS — M4802 Spinal stenosis, cervical region: Secondary | ICD-10-CM | POA: Diagnosis not present

## 2023-05-07 DIAGNOSIS — G894 Chronic pain syndrome: Secondary | ICD-10-CM | POA: Diagnosis not present

## 2023-05-07 DIAGNOSIS — E119 Type 2 diabetes mellitus without complications: Secondary | ICD-10-CM | POA: Diagnosis not present

## 2023-05-14 DIAGNOSIS — Z79899 Other long term (current) drug therapy: Secondary | ICD-10-CM | POA: Diagnosis not present

## 2023-05-22 DIAGNOSIS — J449 Chronic obstructive pulmonary disease, unspecified: Secondary | ICD-10-CM | POA: Diagnosis not present

## 2023-05-22 DIAGNOSIS — J9601 Acute respiratory failure with hypoxia: Secondary | ICD-10-CM | POA: Diagnosis not present

## 2023-05-28 DIAGNOSIS — J449 Chronic obstructive pulmonary disease, unspecified: Secondary | ICD-10-CM | POA: Diagnosis not present

## 2023-06-08 DIAGNOSIS — G629 Polyneuropathy, unspecified: Secondary | ICD-10-CM | POA: Diagnosis not present

## 2023-06-08 DIAGNOSIS — R030 Elevated blood-pressure reading, without diagnosis of hypertension: Secondary | ICD-10-CM | POA: Diagnosis not present

## 2023-06-08 DIAGNOSIS — G950 Syringomyelia and syringobulbia: Secondary | ICD-10-CM | POA: Diagnosis not present

## 2023-06-08 DIAGNOSIS — G4700 Insomnia, unspecified: Secondary | ICD-10-CM | POA: Diagnosis not present

## 2023-06-08 DIAGNOSIS — E119 Type 2 diabetes mellitus without complications: Secondary | ICD-10-CM | POA: Diagnosis not present

## 2023-06-08 DIAGNOSIS — G894 Chronic pain syndrome: Secondary | ICD-10-CM | POA: Diagnosis not present

## 2023-06-08 DIAGNOSIS — R110 Nausea: Secondary | ICD-10-CM | POA: Diagnosis not present

## 2023-06-08 DIAGNOSIS — Z79899 Other long term (current) drug therapy: Secondary | ICD-10-CM | POA: Diagnosis not present

## 2023-06-08 DIAGNOSIS — M4802 Spinal stenosis, cervical region: Secondary | ICD-10-CM | POA: Diagnosis not present

## 2023-06-09 ENCOUNTER — Other Ambulatory Visit: Payer: Self-pay | Admitting: Pulmonary Disease

## 2023-06-09 NOTE — Telephone Encounter (Signed)
Last OV 06/22/22  Last refill- 07/14/22 Ramelteon 8mg  #30, 3 refills  Refill request routed to Dr. Judeth Horn to advise

## 2023-06-10 DIAGNOSIS — Z79899 Other long term (current) drug therapy: Secondary | ICD-10-CM | POA: Diagnosis not present

## 2023-06-11 ENCOUNTER — Ambulatory Visit: Payer: Medicaid Other

## 2023-06-11 ENCOUNTER — Ambulatory Visit
Admission: EM | Admit: 2023-06-11 | Discharge: 2023-06-11 | Disposition: A | Discharge status: Home / Self Care | Payer: Medicaid Other | Attending: Family Medicine | Admitting: Family Medicine

## 2023-06-11 DIAGNOSIS — J441 Chronic obstructive pulmonary disease with (acute) exacerbation: Secondary | ICD-10-CM

## 2023-06-11 DIAGNOSIS — R0602 Shortness of breath: Secondary | ICD-10-CM | POA: Diagnosis not present

## 2023-06-11 DIAGNOSIS — R058 Other specified cough: Secondary | ICD-10-CM | POA: Diagnosis not present

## 2023-06-11 DIAGNOSIS — J22 Unspecified acute lower respiratory infection: Secondary | ICD-10-CM

## 2023-06-11 MED ORDER — DEXAMETHASONE SODIUM PHOSPHATE 10 MG/ML IJ SOLN
10.0000 mg | Freq: Once | INTRAMUSCULAR | Status: AC
  Administered 2023-06-11: 10 mg via INTRAMUSCULAR

## 2023-06-11 MED ORDER — AMOXICILLIN-POT CLAVULANATE 875-125 MG PO TABS: 1.0000 | ORAL_TABLET | Freq: Two times a day (BID) | ORAL | 0 refills | Status: DC

## 2023-06-11 MED ORDER — BENZONATATE 100 MG PO CAPS: 100.0000 mg | ORAL_CAPSULE | Freq: Three times a day (TID) | ORAL | 0 refills | Status: DC

## 2023-06-11 MED ORDER — IPRATROPIUM-ALBUTEROL 0.5-2.5 (3) MG/3ML IN SOLN: 3.0000 mL | RESPIRATORY_TRACT | 1 refills | Status: DC | PRN

## 2023-06-11 NOTE — ED Provider Notes (Signed)
[facility]    CSN: [id number] Arrival date & time: 06/11/23  1504      History   Chief Complaint No chief complaint on file.   HPI Margaret Vaughn is a 62 y.o. female.   Patient presenting today with 1 week history of progressively worsening cough, chest congestion, wheezing, shortness of breath, nasal congestion, fatigue.  Denies fever, chills, chest pain, abdominal pain, nausea vomiting or diarrhea.  History of asthma and severe COPD, supposed to be on continuous oxygen but has not been wearing it as she should lately.  Has Symbicort and Spiriva inhalers, states she is out of DuoNeb solution for her nebulizer machine so has not been able to do that.  Multiple sick contacts recently.    Past Medical History:  Diagnosis Date   Acid reflux    Allergies    Anemia    Asthma    Bronchitis    Cervical stenosis of spine    Chronic pain syndrome    Complication of anesthesia    difficulty waking up from anesthesia   COPD (chronic obstructive pulmonary disease) (HCC)    DDD (degenerative disc disease), lumbar    Depression with anxiety    Diabetes mellitus without complication (HCC)    Dyspnea    Dysrhythmia    PSVT   Fatigue    H/O blood clots    H/O echocardiogram 1989   Per pt, done @ [facility]- Showed mitral valve prolapse   Headache(784.0)    Heart murmur    Hemorrhoids, external    History of hiatal hernia    History of kidney stones    History of PSVT (paroxysmal supraventricular tachycardia)    HTN (hypertension)    Hyperlipidemia    Hypoxemia    Insomnia    Memory loss    Myalgia and myositis    Narcolepsy    On home O2    2-4 L @ HS   OSA (obstructive sleep apnea)    Pneumonia    Sleeping difficulty    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HCC)    Thoracalgia    Tobacco abuse    Tremor    Ulcer disease     Patient Active Problem List   Diagnosis Date Noted   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   Myelopathy (HCC) 03/10/2021    HNP (herniated nucleus pulposus) with myelopathy, cervical 12/18/2020   Cervical myelopathy (HCC) 08/13/2020   Cervical spinal cord compression (HCC) 08/08/2020   Chronic respiratory failure with hypoxia (HCC) 08/08/2020   Type 2 diabetes mellitus with diabetic polyneuropathy, without long-term current use of insulin (HCC) 08/08/2020   Fibromyalgia 01/06/2019   Neuropathy 01/06/2019   Overweight 03/11/2018   COPD (chronic obstructive pulmonary disease) (HCC) 03/11/2018   Mixed diabetic hyperlipidemia associated with type 2 diabetes mellitus (HCC) 03/11/2018   PTSD (post-traumatic stress disorder) 03/11/2018   GAD (generalized anxiety disorder) 01/30/2014   Essential hypertension, benign 01/30/2014   Hyperlipidemia 08/10/2012   Tobacco abuse 08/10/2012   GERD 12/24/2009    Past Surgical History:  Procedure Laterality Date   ABDOMINAL HYSTERECTOMY     ANTERIOR CERVICAL DECOMP/DISCECTOMY FUSION N/A 12/18/2020   Procedure: CERVICAL TWO-THREE ANTERIOR CERVICAL DISCECTOMY/DECOMPRESSION FUSION;  Surgeon: Donalee Citrin, MD;  Location: [facility] OR;  Service: Neurosurgery;  Laterality: N/A;   APPENDECTOMY  1992   BIOPSY  07/01/2022   Procedure: BIOPSY;  Surgeon: Corbin Ade, MD;  Location: [facility];  Service: Endoscopy;;   CHOLECYSTECTOMY  1992   COLONOSCOPY  WITH PROPOFOL N/A 07/01/2022   Procedure: COLONOSCOPY WITH PROPOFOL;  Surgeon: Corbin Ade, MD;  Location: [facility];  Service: Endoscopy;  Laterality: N/A;  12:00 pm   DIAGNOSTIC LAPAROSCOPY  1992   lap chole   ESOPHAGOGASTRODUODENOSCOPY (EGD) WITH PROPOFOL N/A 07/01/2022   Procedure: ESOPHAGOGASTRODUODENOSCOPY (EGD) WITH PROPOFOL;  Surgeon: Corbin Ade, MD;  Location: [facility];  Service: Endoscopy;  Laterality: N/A;   HEMOSTASIS CLIP PLACEMENT  07/01/2022   Procedure: HEMOSTASIS CLIP PLACEMENT;  Surgeon: Corbin Ade, MD;  Location: [facility];  Service: Endoscopy;;   HERNIA REPAIR     umbilical... as a child    MALONEY DILATION  07/01/2022   Procedure: MALONEY DILATION;  Surgeon: Corbin Ade, MD;  Location: [facility];  Service: Endoscopy;;   OOPHORECTOMY     POLYPECTOMY  07/01/2022   Procedure: POLYPECTOMY;  Surgeon: Corbin Ade, MD;  Location: [facility];  Service: Endoscopy;;   POSTERIOR CERVICAL FUSION/FORAMINOTOMY N/A 08/07/2020   Procedure: POSTERIOR CERVICAL LAMINECTOMY FOR MYELOPATHY;  Surgeon: Donalee Citrin, MD;  Location: [facility] OR;  Service: Neurosurgery;  Laterality: N/A;   POSTERIOR CERVICAL FUSION/FORAMINOTOMY N/A 03/10/2021   Procedure: Posterior cervical fusion with lateral mass fixation Cervical one - cervical four;  Surgeon: Donalee Citrin, MD;  Location: [facility] OR;  Service: Neurosurgery;  Laterality: N/A;   POSTERIOR CERVICAL LAMINECTOMY  08/07/2020   w/ Dr. Wynetta Emery   tonsillectomy     TONSILLECTOMY     TUBAL LIGATION      OB History   No obstetric history on file.      Home Medications    Prior to Admission medications   Medication Sig Start Date End Date Taking? Authorizing Provider  amoxicillin-clavulanate (AUGMENTIN) 875-125 MG tablet Take 1 tablet by mouth every 12 (twelve) hours. 06/11/23  Yes Particia Nearing, PA-C  benzonatate (TESSALON) 100 MG capsule Take 1 capsule (100 mg total) by mouth every 8 (eight) hours. 06/11/23  Yes Particia Nearing, PA-C  albuterol (VENTOLIN HFA) 108 (90 Base) MCG/ACT inhaler INHALE 2 PUFFS BY MOUTH EVERY 6 HOURS AS NEEDED FOR WHEEZE OR SHORTNESS OF BREATH 04/16/23   Dettinger, Elige Radon, MD  AMITIZA 24 MCG capsule TAKE 1 CAPSULE (24 MCG TOTAL) BY MOUTH 2 (TWO) TIMES DAILY WITH A MEAL. 09/07/22   [name], NP  aspirin EC 81 MG tablet Take 81 mg by mouth daily. Swallow whole.    [provider]  atorvastatin (LIPITOR) 40 MG tablet Take 1 tablet (40 mg total) by mouth daily. 04/16/23   Dettinger, Elige Radon, MD  baclofen (LIORESAL) 10 MG tablet Take 10 mg by mouth 3 (three) times daily as needed for muscle spasms.  06/12/22   [provider]  benzonatate (TESSALON) 100 MG capsule Take 1 capsule (100 mg total) by mouth 3 (three) times daily as needed for cough. Do not take with alcohol or while driving or operating heavy machinery.  May cause drowsiness. 01/12/23   Valentino Nose, NP  budesonide-formoterol (SYMBICORT) 160-4.5 MCG/ACT inhaler Inhale 2 puffs into the lungs 2 (two) times daily. 04/10/22   Hunsucker, Lesia Sago, MD  buprenorphine (BUTRANS) 5 MCG/HR PTWK Place 1 patch onto the skin once a week.    [provider]  cholecalciferol (VITAMIN D3) 25 MCG (1000 UNIT) tablet Take 1,000 Units by mouth daily.    [provider]  conjugated estrogens (PREMARIN) vaginal cream Pea-sized amount (0.5g) twice weekly 11/06/22   Myna Hidalgo, DO  Cyanocobalamin (B-12 PO) Take 1 tablet by mouth daily.    [provider]  cyclobenzaprine (FLEXERIL) 10 MG tablet TAKE 1 TABLET BY MOUTH AT BEDTIME AS NEEDED FOR MUSCLE SPASMS. 03/25/23   Dettinger, Elige Radon, MD  DULoxetine (CYMBALTA) 60 MG capsule Take 1 capsule (60 mg total) by mouth daily. TAKE 1 CAPSULE BY MOUTH EVERY DAY 06/26/22   Dettinger, Elige Radon, MD  enalapril (VASOTEC) 5 MG tablet Take 1 tablet (5 mg total) by mouth daily. 04/16/23   Dettinger, Elige Radon, MD  esomeprazole (NEXIUM) 40 MG capsule Take 1 capsule (40 mg total) by mouth daily. 01/08/22   [name], NP  hydrOXYzine (ATARAX) 10 MG tablet TAKE 0.5 TABLETS (5 MG TOTAL) BY MOUTH 2 (TWO) TIMES DAILY AS NEEDED FOR ITCHING. 09/07/22   Mahon, Courtney L, NP  ipratropium-albuterol (DUONEB) 0.5-2.5 (3) MG/3ML SOLN Take 3 mLs by nebulization every 4 (four) hours as needed. 06/11/23   Particia Nearing, PA-C  Multiple Vitamins-Minerals (HAIR SKIN NAILS PO) Take 3 tablets by mouth daily.    [provider]  Multiple Vitamins-Minerals (MULTIVITAMIN WITH MINERALS) tablet Take 1 tablet by mouth daily.    [provider]  mupirocin ointment (BACTROBAN) 2 %  Apply to both nares bid for 5 days 07/08/22   Rourk, Gerrit Friends, MD  nicotine (NICODERM CQ - DOSED IN MG/24 HOURS) 21 mg/24hr patch Place 21 mg onto the skin daily as needed (smoking cessation).    [provider]  nitrofurantoin (MACRODANTIN) 100 MG capsule Take 100 mg by mouth daily.    [provider]  oxyCODONE (OXYCONTIN) 10 mg 12 hr tablet Take 1 tablet (10 mg total) by mouth 2 (two) times daily. 03/14/21   Donalee Citrin, MD  pregabalin (LYRICA) 150 MG capsule Take 1 capsule (150 mg total) by mouth 3 (three) times daily. 06/26/20   Dettinger, Elige Radon, MD  ramelteon (ROZEREM) 8 MG tablet TAKE 1 TABLET BY MOUTH AT BEDTIME. 07/14/22   Hunsucker, Lesia Sago, MD  ramelteon (ROZEREM) 8 MG tablet TAKE 1 TABLET BY MOUTH EVERYDAY AT BEDTIME 06/09/23   Hunsucker, Lesia Sago, MD  sitaGLIPtin-metformin (JANUMET) 50-1000 MG tablet TAKE 1 TABLET BY MOUTH TWICE A DAY WITH FOOD 04/16/23   Dettinger, Elige Radon, MD  Tiotropium Bromide Monohydrate (SPIRIVA RESPIMAT) 2.5 MCG/ACT AERS Inhale 2 puffs into the lungs daily. 06/22/22   Hunsucker, Lesia Sago, MD    Family History Family History  Problem Relation Age of Onset   Ulcers Mother    Sleep apnea Mother    Migraines Mother    Cancer Mother        uterine   Arthritis Mother    Hypertension Mother    Heart disease Mother    Depression Mother    Hyperlipidemia Mother    Colon cancer Mother    Arrhythmia Son     Social History Social History   Tobacco Use   Smoking status: Every Day    Current packs/day: 2.00    Average packs/day: 2.0 packs/day for 30.0 years (60.0 ttl pk-yrs)    Types: Cigarettes   Smokeless tobacco: Never   Tobacco comments:    Pt states she is down to 1 ppd. 06/22/2022   Vaping Use   Vaping status: Never Used  Substance Use Topics   Alcohol use: No   Drug use: No     Allergies   Avelox [moxifloxacin], Iohexol, and Shellfish allergy   Review of Systems Review of Systems Per HPI  Physical Exam Triage  Vital Signs ED Triage Vitals  Encounter Vitals Group     BP 06/11/23 1556 127/75     Systolic BP Percentile --      Diastolic BP Percentile --      Pulse Rate 06/11/23 1556 96     Resp 06/11/23 1556 14     Temp 06/11/23 1556 98.6 F (37 C)     Temp Source 06/11/23 1556 Oral     SpO2 06/11/23 1556 (!) 88 %     Weight --      Height --      Head Circumference --      Peak Flow --      Pain Score 06/11/23 1559 6     Pain Loc --      Pain Education --      Exclude from Growth Chart --    No data found.  Updated Vital Signs BP 127/75 (BP Location: Right Arm)   Pulse 96   Temp 98.6 F (37 C) (Oral)   Resp 14   SpO2 (!) 88%   Visual Acuity Right Eye Distance:   Left Eye Distance:   Bilateral Distance:    Right Eye Near:   Left Eye Near:    Bilateral Near:     Physical Exam Vitals and nursing note reviewed.  Constitutional:      Appearance: Normal appearance.  HENT:     Head: Atraumatic.     Right Ear: Tympanic membrane and external ear normal.     Left Ear: Tympanic membrane and external ear normal.     Nose: Congestion present.     Mouth/Throat:     Mouth: Mucous membranes are moist.     Pharynx: Posterior oropharyngeal erythema present.  Eyes:     Extraocular Movements: Extraocular movements intact.     Conjunctiva/sclera: Conjunctivae normal.  Cardiovascular:     Rate and Rhythm: Normal rate and regular rhythm.     Heart sounds: Normal heart sounds.  Pulmonary:     Breath sounds: Wheezing and rales present.     Comments: On continuous O2 via nasal cannula.  Breath sounds decreased throughout Musculoskeletal:        General: Normal range of motion.     Cervical back: Normal range of motion and neck supple.  Skin:    General: Skin is warm and dry.  Neurological:     Mental Status: She is alert and oriented to person, place, and time.  Psychiatric:        Mood and Affect: Mood normal.        Thought Content: Thought content normal.      UC  Treatments / Results  Labs (all labs ordered are listed, but only abnormal results are displayed) Labs Reviewed - No data to display  EKG   Radiology No results found.  Procedures Procedures (including critical care time)  Medications Ordered in UC Medications  dexamethasone (DECADRON) injection 10 mg (10 mg Intramuscular Given 06/11/23 1632)    Initial Impression / Assessment and Plan / UC Course  I have reviewed the triage vital signs and the nursing notes.  Pertinent labs & imaging results that were available during my care of the patient were reviewed by me and considered in my medical decision making (see chart for details).     Oxygen saturation 88% on room air upon arrival, she states she was not using her oxygen all day as she was out and about moving around.  Oxygen saturation improved to 95% on 2 L nasal cannula which she states is her baseline.  Chest x-ray pending, given significant wheezes and crackles on exam will cover with Augmentin, IM Decadron, Tessalon Perles and refill DuoNeb solution.  Continue inhaler regimen.  Return for worsening symptoms.  Final Clinical Impressions(s) / UC Diagnoses   Final diagnoses:  COPD exacerbation (HCC)  Lower respiratory infection   Discharge Instructions   None    ED Prescriptions     Medication Sig Dispense Auth. Provider   amoxicillin-clavulanate (AUGMENTIN) 875-125 MG tablet Take 1 tablet by mouth every 12 (twelve) hours. 14 tablet Particia Nearing, [place]   benzonatate (TESSALON) 100 MG capsule Take 1 capsule (100 mg total) by mouth every 8 (eight) hours. 21 capsule Particia Nearing, [place]   ipratropium-albuterol (DUONEB) 0.5-2.5 (3) MG/3ML SOLN Take 3 mLs by nebulization every 4 (four) hours as needed. 360 mL Particia Nearing, [place]      PDMP not reviewed this encounter.   Particia Nearing, [place] 06/11/23 [phone]

## 2023-06-11 NOTE — ED Triage Notes (Signed)
Pt reports she has some chest congestion, head congestion, cough that makes her vomit x 1 week  Pt does not wear her oxygen as she is supposed to

## 2023-06-11 NOTE — Telephone Encounter (Signed)
Please advise on refill request

## 2023-06-27 DIAGNOSIS — J449 Chronic obstructive pulmonary disease, unspecified: Secondary | ICD-10-CM | POA: Diagnosis not present

## 2023-07-09 DIAGNOSIS — G950 Syringomyelia and syringobulbia: Secondary | ICD-10-CM | POA: Diagnosis not present

## 2023-07-09 DIAGNOSIS — M4802 Spinal stenosis, cervical region: Secondary | ICD-10-CM | POA: Diagnosis not present

## 2023-07-09 DIAGNOSIS — R110 Nausea: Secondary | ICD-10-CM | POA: Diagnosis not present

## 2023-07-09 DIAGNOSIS — Z79899 Other long term (current) drug therapy: Secondary | ICD-10-CM | POA: Diagnosis not present

## 2023-07-09 DIAGNOSIS — G894 Chronic pain syndrome: Secondary | ICD-10-CM | POA: Diagnosis not present

## 2023-07-09 DIAGNOSIS — G629 Polyneuropathy, unspecified: Secondary | ICD-10-CM | POA: Diagnosis not present

## 2023-07-09 DIAGNOSIS — E119 Type 2 diabetes mellitus without complications: Secondary | ICD-10-CM | POA: Diagnosis not present

## 2023-07-09 DIAGNOSIS — B370 Candidal stomatitis: Secondary | ICD-10-CM | POA: Diagnosis not present

## 2023-07-09 DIAGNOSIS — G4700 Insomnia, unspecified: Secondary | ICD-10-CM | POA: Diagnosis not present

## 2023-07-13 DIAGNOSIS — Z79899 Other long term (current) drug therapy: Secondary | ICD-10-CM | POA: Diagnosis not present

## 2023-07-16 ENCOUNTER — Encounter: Payer: Self-pay | Admitting: Family Medicine

## 2023-07-16 ENCOUNTER — Ambulatory Visit: Payer: Medicaid Other | Admitting: Family Medicine

## 2023-07-16 VITALS — BP 138/85 | HR 88 | Temp 98.4°F | Ht 62.0 in | Wt 138.0 lb

## 2023-07-16 DIAGNOSIS — Z7984 Long term (current) use of oral hypoglycemic drugs: Secondary | ICD-10-CM

## 2023-07-16 DIAGNOSIS — J449 Chronic obstructive pulmonary disease, unspecified: Secondary | ICD-10-CM | POA: Diagnosis not present

## 2023-07-16 DIAGNOSIS — I10 Essential (primary) hypertension: Secondary | ICD-10-CM

## 2023-07-16 DIAGNOSIS — E1142 Type 2 diabetes mellitus with diabetic polyneuropathy: Secondary | ICD-10-CM | POA: Diagnosis not present

## 2023-07-16 DIAGNOSIS — E119 Type 2 diabetes mellitus without complications: Secondary | ICD-10-CM | POA: Diagnosis not present

## 2023-07-16 DIAGNOSIS — E1169 Type 2 diabetes mellitus with other specified complication: Secondary | ICD-10-CM

## 2023-07-16 DIAGNOSIS — E782 Mixed hyperlipidemia: Secondary | ICD-10-CM | POA: Diagnosis not present

## 2023-07-16 LAB — BAYER DCA HB A1C WAIVED: HB A1C (BAYER DCA - WAIVED): 5.3 % (ref 4.8–5.6)

## 2023-07-16 MED ORDER — BENZONATATE 200 MG PO CAPS: 200.0000 mg | ORAL_CAPSULE | Freq: Three times a day (TID) | ORAL | 1 refills | Status: DC | PRN

## 2023-07-16 MED ORDER — METHYLPREDNISOLONE ACETATE 80 MG/ML IJ SUSP
80.0000 mg | Freq: Once | INTRAMUSCULAR | Status: AC
  Administered 2023-07-16: 80 mg via INTRA_ARTICULAR

## 2023-07-16 MED ORDER — PREDNISONE 20 MG PO TABS: ORAL_TABLET | ORAL | 0 refills | Status: DC

## 2023-07-16 NOTE — Progress Notes (Addendum)
 BP 138/85   Pulse 88   Temp 98.4 F (36.9 C)   Ht 5\' 2"  (1.575 m)   Wt 138 lb (62.6 kg)   SpO2 (!) 89%   BMI 25.24 kg/m    Subjective:   Patient ID: Margaret Vaughn, female    DOB: [birth year], 62 y.o.   MRN: [id number]  HPI: LOUCILLE Vaughn is a 62 y.o. female presenting on 07/16/2023 for Medical Management of Chronic Issues   HPI Type 2 diabetes mellitus Patient comes in today for recheck of his diabetes. Patient has been currently taking Janumet. Patient is currently on an ACE inhibitor/ARB. Patient has seen an ophthalmologist this year. Patient denies any new issues with their feet. The symptom started onset as an adult hypertension and hyperlipidemia and polyneuropathy and myelopathy ARE RELATED TO DM   Hypertension Patient is currently on enalapril, and their blood pressure today is 138/85. Patient denies any lightheadedness or dizziness. Patient denies headaches, blurred vision, chest pains, shortness of breath, or weakness. Denies any side effects from medication and is content with current medication.   Hyperlipidemia Patient is coming in for recheck of his hyperlipidemia. The patient is currently taking atorvastatin. They deny any issues with myalgias or history of liver damage from it. They deny any focal numbness or weakness or chest pain.   COPD Patient is coming in for COPD recheck today.  Patient has been recovering from an illness and had a antibiotic in some steroids over a month ago but she still has his residual coughing spells and coughing fits and they have been improving but she still having them a lot and just not doing as well as she normally does.  She is still on oxygen and using her inhalers.  She uses Symbicort and Spiriva and albuterol and DuoNebs.  Relevant past medical, surgical, family and social history reviewed and updated as indicated. Interim medical history since our last visit reviewed. Allergies and medications reviewed and  updated.  Review of Systems  Constitutional:  Negative for chills and fever.  Eyes:  Negative for visual disturbance.  Respiratory:  Negative for chest tightness and shortness of breath.   Cardiovascular:  Negative for chest pain and leg swelling.  Genitourinary:  Negative for difficulty urinating and dysuria.  Musculoskeletal:  Negative for back pain and gait problem.  Skin:  Negative for rash.  Neurological:  Negative for dizziness, light-headedness and headaches.  Psychiatric/Behavioral:  Negative for agitation and behavioral problems.   All other systems reviewed and are negative.   Per HPI unless specifically indicated above   Allergies as of 07/16/2023       Reactions   Avelox [moxifloxacin] Anaphylaxis   Iohexol Anaphylaxis   PT.GIVEN OMNI 300 FOR CT ANGIO WAS GIVEN 50 MG.BENADRYL PRIOR TO SCAN HAD NO PROBLEMS   Shellfish Allergy Hives        Medication List        Accurate as of July 16, 2023  4:03 PM. If you have any questions, ask your nurse or doctor.          STOP taking these medications    amoxicillin-clavulanate 875-125 MG tablet Commonly known as: AUGMENTIN Stopped by: Elige Radon Nathanyel Defenbaugh       TAKE these medications    albuterol 108 (90 Base) MCG/ACT inhaler Commonly known as: Ventolin HFA INHALE 2 PUFFS BY MOUTH EVERY 6 HOURS AS NEEDED FOR WHEEZE OR SHORTNESS OF BREATH   Amitiza 24 MCG capsule Generic drug: lubiprostone TAKE 1  CAPSULE (24 MCG TOTAL) BY MOUTH 2 (TWO) TIMES DAILY WITH A MEAL.   aspirin EC 81 MG tablet Take 81 mg by mouth daily. Swallow whole.   atorvastatin 40 MG tablet Commonly known as: LIPITOR Take 1 tablet (40 mg total) by mouth daily.   B-12 PO Take 1 tablet by mouth daily.   baclofen 10 MG tablet Commonly known as: LIORESAL Take 10 mg by mouth 3 (three) times daily as needed for muscle spasms.   benzonatate 100 MG capsule Commonly known as: TESSALON Take 1 capsule (100 mg total) by mouth 3 (three)  times daily as needed for cough. Do not take with alcohol or while driving or operating heavy machinery.  May cause drowsiness. What changed: Another medication with the same name was changed. Make sure you understand how and when to take each. Changed by: Elige Radon Diandre Merica   benzonatate 200 MG capsule Commonly known as: TESSALON Take 1 capsule (200 mg total) by mouth 3 (three) times daily as needed for cough. What changed:  medication strength how much to take when to take this reasons to take this Changed by: Elige Radon Ronniesha Seibold   budesonide-formoterol 160-4.5 MCG/ACT inhaler Commonly known as: Symbicort Inhale 2 puffs into the lungs 2 (two) times daily.   Butrans 5 MCG/HR Ptwk Generic drug: buprenorphine Place 1 patch onto the skin once a week.   cholecalciferol 25 MCG (1000 UNIT) tablet Commonly known as: VITAMIN D3 Take 1,000 Units by mouth daily.   cyclobenzaprine 10 MG tablet Commonly known as: FLEXERIL TAKE 1 TABLET BY MOUTH AT BEDTIME AS NEEDED FOR MUSCLE SPASMS.   DULoxetine 60 MG capsule Commonly known as: CYMBALTA Take 1 capsule (60 mg total) by mouth daily. TAKE 1 CAPSULE BY MOUTH EVERY DAY   enalapril 5 MG tablet Commonly known as: VASOTEC Take 1 tablet (5 mg total) by mouth daily.   esomeprazole 40 MG capsule Commonly known as: NEXIUM Take 1 capsule (40 mg total) by mouth daily.   HAIR SKIN NAILS PO Take 3 tablets by mouth daily.   multivitamin with minerals tablet Take 1 tablet by mouth daily.   hydrOXYzine 10 MG tablet Commonly known as: ATARAX TAKE 0.5 TABLETS (5 MG TOTAL) BY MOUTH 2 (TWO) TIMES DAILY AS NEEDED FOR ITCHING.   ipratropium-albuterol 0.5-2.5 (3) MG/3ML Soln Commonly known as: DUONEB Take 3 mLs by nebulization every 4 (four) hours as needed.   Janumet 50-1000 MG tablet Generic drug: sitaGLIPtin-metformin TAKE 1 TABLET BY MOUTH TWICE A DAY WITH FOOD   mupirocin ointment 2 % Commonly known as: BACTROBAN Apply to both nares bid  for 5 days   nicotine 21 mg/24hr patch Commonly known as: NICODERM CQ - dosed in mg/24 hours Place 21 mg onto the skin daily as needed (smoking cessation).   nitrofurantoin 100 MG capsule Commonly known as: MACRODANTIN Take 100 mg by mouth daily.   oxyCODONE 10 mg 12 hr tablet Commonly known as: OXYCONTIN Take 1 tablet (10 mg total) by mouth 2 (two) times daily.   predniSONE 20 MG tablet Commonly known as: DELTASONE 2 po at same time daily for 5 days Started by: Elige Radon Brynli Ollis   pregabalin 150 MG capsule Commonly known as: LYRICA Take 1 capsule (150 mg total) by mouth 3 (three) times daily.   Premarin vaginal cream Generic drug: conjugated estrogens Pea-sized amount (0.5g) twice weekly   ramelteon 8 MG tablet Commonly known as: ROZEREM TAKE 1 TABLET BY MOUTH AT BEDTIME.   ramelteon 8 MG tablet  Commonly known as: ROZEREM TAKE 1 TABLET BY MOUTH EVERYDAY AT BEDTIME   Spiriva Respimat 2.5 MCG/ACT Aers Generic drug: Tiotropium Bromide Monohydrate Inhale 2 puffs into the lungs daily.         Objective:   BP 138/85   Pulse 88   Temp 98.4 F (36.9 C)   Ht 5\' 2"  (1.575 m)   Wt 138 lb (62.6 kg)   SpO2 (!) 89%   BMI 25.24 kg/m   Wt Readings from Last 3 Encounters:  07/16/23 138 lb (62.6 kg)  04/16/23 144 lb 3.2 oz (65.4 kg)  01/01/23 137 lb (62.1 kg)    Physical Exam Vitals and nursing note reviewed.  Constitutional:      General: She is not in acute distress.    Appearance: She is well-developed. She is not diaphoretic.  Eyes:     Conjunctiva/sclera: Conjunctivae normal.  Cardiovascular:     Rate and Rhythm: Normal rate and regular rhythm.     Heart sounds: Normal heart sounds. No murmur heard. Pulmonary:     Effort: Pulmonary effort is normal. No respiratory distress.     Breath sounds: Normal breath sounds. No wheezing.  Musculoskeletal:        General: No swelling or tenderness. Normal range of motion.  Skin:    General: Skin is warm and dry.      Findings: No rash.  Neurological:     Mental Status: She is alert and oriented to person, place, and time.     Coordination: Coordination normal.  Psychiatric:        Behavior: Behavior normal.     Results for orders placed or performed in visit on 07/16/23  Bayer DCA Hb A1c Waived   Collection Time: 07/16/23  2:57 PM  Result Value Ref Range   HB A1C (BAYER DCA - WAIVED) 5.3 4.8 - 5.6 %    Assessment & Plan:   Problem List Items Addressed This Visit       Cardiovascular and Mediastinum   Essential hypertension, benign   Relevant Orders   BMP8+EGFR     Respiratory   COPD (chronic obstructive pulmonary disease) (HCC)   Relevant Medications   benzonatate (TESSALON) 200 MG capsule   methylPREDNISolone acetate (DEPO-MEDROL) injection 80 mg   predniSONE (DELTASONE) 20 MG tablet     Endocrine   Mixed diabetic hyperlipidemia associated with type 2 diabetes mellitus (HCC)   Type 2 diabetes mellitus with diabetic polyneuropathy, without long-term current use of insulin (HCC)   Relevant Orders   BMP8+EGFR     Other   Hyperlipidemia   Other Visit Diagnoses       Diabetes mellitus without complication (HCC)    -  Primary   Relevant Orders   Bayer DCA Hb A1c Waived (Completed)       Potassium was slightly elevated last time, will do BMP today.  Diabetes recheck  COPD is slightly acting up, will do Depo-Medrol 80 intramuscular injection today and get some oral steroids to see if we can improve it.  She is to continue her inhalers as well.  She has had increased number and persistence of COPD and exacerbations.  She continues to use Symbicort and Spiriva so on triple therapy and uses albuterol and nebulizer but still needs the oxygen despite these treatments.  Follow up plan: Return in about 3 months (around 10/14/2023), or if symptoms worsen or fail to improve, for Diabetes recheck.  Counseling provided for all of the vaccine components Orders Placed  This Encounter   Procedures   Bayer [facility] Hb A1c Waived   BMP8+EGFR    Arville Care, MD [facility] 07/16/2023, 4:03 PM

## 2023-07-17 LAB — BMP8+EGFR
BUN/Creatinine Ratio: 13 (ref 12–28)
BUN: 9 mg/dL (ref 8–27)
CO2: 29 mmol/L (ref 20–29)
Calcium: 9.2 mg/dL (ref 8.7–10.3)
Chloride: 98 mmol/L (ref 96–106)
Creatinine, Ser: 0.7 mg/dL (ref 0.57–1.00)
Glucose: 112 mg/dL — ABNORMAL HIGH (ref 70–99)
Potassium: 4.5 mmol/L (ref 3.5–5.2)
Sodium: 139 mmol/L (ref 134–144)
eGFR: 97 mL/min/{1.73_m2} (ref >59)

## 2023-07-28 DIAGNOSIS — J449 Chronic obstructive pulmonary disease, unspecified: Secondary | ICD-10-CM | POA: Diagnosis not present

## 2023-08-12 ENCOUNTER — Emergency Department (HOSPITAL_COMMUNITY): Payer: Medicaid Other

## 2023-08-12 ENCOUNTER — Encounter (HOSPITAL_COMMUNITY): Payer: Self-pay

## 2023-08-12 ENCOUNTER — Other Ambulatory Visit: Payer: Self-pay

## 2023-08-12 ENCOUNTER — Ambulatory Visit
Admission: EM | Admit: 2023-08-12 | Discharge: 2023-08-12 | Disposition: A | Discharge status: Nursing Home (Includes ICF) | Payer: Medicaid Other

## 2023-08-12 ENCOUNTER — Encounter: Payer: Self-pay | Admitting: Emergency Medicine

## 2023-08-12 ENCOUNTER — Emergency Department (HOSPITAL_COMMUNITY)
Admission: EM | Admit: 2023-08-12 | Discharge: 2023-08-12 | Disposition: A | Discharge status: Home / Self Care | Payer: Medicaid Other | Attending: Emergency Medicine | Admitting: Emergency Medicine

## 2023-08-12 DIAGNOSIS — R059 Cough, unspecified: Secondary | ICD-10-CM | POA: Diagnosis not present

## 2023-08-12 DIAGNOSIS — B974 Respiratory syncytial virus as the cause of diseases classified elsewhere: Secondary | ICD-10-CM | POA: Insufficient documentation

## 2023-08-12 DIAGNOSIS — R0602 Shortness of breath: Secondary | ICD-10-CM | POA: Diagnosis not present

## 2023-08-12 DIAGNOSIS — Z20822 Contact with and (suspected) exposure to covid-19: Secondary | ICD-10-CM | POA: Diagnosis not present

## 2023-08-12 DIAGNOSIS — J449 Chronic obstructive pulmonary disease, unspecified: Secondary | ICD-10-CM

## 2023-08-12 DIAGNOSIS — B338 Other specified viral diseases: Secondary | ICD-10-CM

## 2023-08-12 DIAGNOSIS — I700 Atherosclerosis of aorta: Secondary | ICD-10-CM | POA: Diagnosis not present

## 2023-08-12 DIAGNOSIS — J9601 Acute respiratory failure with hypoxia: Secondary | ICD-10-CM | POA: Diagnosis not present

## 2023-08-12 DIAGNOSIS — Z79899 Other long term (current) drug therapy: Secondary | ICD-10-CM | POA: Insufficient documentation

## 2023-08-12 DIAGNOSIS — Z7982 Long term (current) use of aspirin: Secondary | ICD-10-CM | POA: Insufficient documentation

## 2023-08-12 DIAGNOSIS — E119 Type 2 diabetes mellitus without complications: Secondary | ICD-10-CM | POA: Insufficient documentation

## 2023-08-12 DIAGNOSIS — J441 Chronic obstructive pulmonary disease with (acute) exacerbation: Secondary | ICD-10-CM | POA: Insufficient documentation

## 2023-08-12 DIAGNOSIS — R062 Wheezing: Secondary | ICD-10-CM | POA: Diagnosis not present

## 2023-08-12 DIAGNOSIS — Z794 Long term (current) use of insulin: Secondary | ICD-10-CM | POA: Insufficient documentation

## 2023-08-12 LAB — CBC WITH DIFFERENTIAL/PLATELET
Abs Immature Granulocytes: 0.02 10*3/uL (ref 0.00–0.07)
Basophils Absolute: 0 10*3/uL (ref 0.0–0.1)
Basophils Relative: 0 %
Eosinophils Absolute: 0.1 10*3/uL (ref 0.0–0.5)
Eosinophils Relative: 1 %
HCT: 46.2 % — ABNORMAL HIGH (ref 36.0–46.0)
Hemoglobin: 14.3 g/dL (ref 12.0–15.0)
Immature Granulocytes: 0 %
Lymphocytes Relative: 19 %
Lymphs Abs: 1.3 10*3/uL (ref 0.7–4.0)
MCH: 29.1 pg (ref 26.0–34.0)
MCHC: 31 g/dL (ref 30.0–36.0)
MCV: 94.1 fL (ref 80.0–100.0)
Monocytes Absolute: 0.8 10*3/uL (ref 0.1–1.0)
Monocytes Relative: 12 %
Neutro Abs: 4.6 10*3/uL (ref 1.7–7.7)
Neutrophils Relative %: 68 %
Platelets: 261 10*3/uL (ref 150–400)
RBC: 4.91 MIL/uL (ref 3.87–5.11)
RDW: 16.1 % — ABNORMAL HIGH (ref 11.5–15.5)
WBC: 6.9 10*3/uL (ref 4.0–10.5)
nRBC: 0 % (ref 0.0–0.2)

## 2023-08-12 LAB — BASIC METABOLIC PANEL
Anion gap: 7 (ref 5–15)
BUN: 17 mg/dL (ref 8–23)
CO2: 30 mmol/L (ref 22–32)
Calcium: 8.6 mg/dL — ABNORMAL LOW (ref 8.9–10.3)
Chloride: 100 mmol/L (ref 98–111)
Creatinine, Ser: 0.69 mg/dL (ref 0.44–1.00)
GFR, Estimated: >60 mL/min (ref >60)
Glucose, Bld: 88 mg/dL (ref 70–99)
Potassium: 4 mmol/L (ref 3.5–5.1)
Sodium: 137 mmol/L (ref 135–145)

## 2023-08-12 LAB — RESP PANEL BY RT-PCR (RSV, FLU A&B, COVID)  RVPGX2
Influenza A by PCR: NEGATIVE
Influenza B by PCR: NEGATIVE
Resp Syncytial Virus by PCR: POSITIVE — AB
SARS Coronavirus 2 by RT PCR: NEGATIVE

## 2023-08-12 LAB — BRAIN NATRIURETIC PEPTIDE: B Natriuretic Peptide: 78 pg/mL (ref 0.0–100.0)

## 2023-08-12 MED ORDER — AZITHROMYCIN 250 MG PO TABS
500.0000 mg | ORAL_TABLET | Freq: Once | ORAL | Status: AC
  Administered 2023-08-12: 500 mg via ORAL
  Filled 2023-08-12: qty 2

## 2023-08-12 MED ORDER — IPRATROPIUM-ALBUTEROL 0.5-2.5 (3) MG/3ML IN SOLN
3.0000 mL | Freq: Once | RESPIRATORY_TRACT | Status: AC
  Administered 2023-08-12: 3 mL via RESPIRATORY_TRACT
  Filled 2023-08-12: qty 3

## 2023-08-12 MED ORDER — PREDNISONE 10 MG PO TABS: ORAL_TABLET | ORAL | 0 refills | Status: AC

## 2023-08-12 MED ORDER — ALBUTEROL SULFATE (2.5 MG/3ML) 0.083% IN NEBU: 2.5000 mg | INHALATION_SOLUTION | Freq: Four times a day (QID) | RESPIRATORY_TRACT | 12 refills | Status: AC | PRN

## 2023-08-12 MED ORDER — METHYLPREDNISOLONE SODIUM SUCC 125 MG IJ SOLR
125.0000 mg | Freq: Once | INTRAMUSCULAR | Status: AC
  Administered 2023-08-12: 125 mg via INTRAVENOUS
  Filled 2023-08-12: qty 2

## 2023-08-12 MED ORDER — AZITHROMYCIN 250 MG PO TABS: 500.0000 mg | ORAL_TABLET | Freq: Every day | ORAL | 0 refills | Status: AC

## 2023-08-12 NOTE — ED Notes (Signed)
Pt placed on 15 liters titratable non-rebreather. 02 Saturation 98%,HR 88

## 2023-08-12 NOTE — Discharge Instructions (Addendum)
You were seen in the emergency department today for concerns of shortness of breath.  He tested positive for RSV.  Your labs and imaging were for the most part reassuring but you do appear to be experiencing a COPD exacerbation.  Given your productive cough that you are experiencing, antibiotics were initiated for you with the remainder of your prescription sent to your pharmacy.  Have also started a course of steroids for you to continue taking at home.  Please return the emergency department for worsening shortness of breath or any other new symptoms.  Otherwise, I would recommend following with a primary care provider.

## 2023-08-12 NOTE — ED Provider Notes (Signed)
[facility]    CSN: [id number] Arrival date & time: 08/12/23  1638      History   Chief Complaint Chief Complaint  Patient presents with   Cough    HPI Margaret Vaughn is a 63 y.o. female.   Patient presenting today for several month history of progressively worsening shortness of breath, wheezing, chest tightness for the past few months.  Was sick in November and seen here for a COPD exacerbation, given antibiotics, steroids and has been compliant with nebulizer treatment and inhaler regimen but states she never got better and has progressively gotten worse especially in the last week or so.  Is currently on 3 L nasal cannula continuously and states her oxygen has gotten as low as 66% on 3 L which was earlier today.  She has a past history of asthma, severe COPD as well as numerous other chronic conditions.    Past Medical History:  Diagnosis Date   Acid reflux    Allergies    Anemia    Asthma    Bronchitis    Cervical stenosis of spine    Chronic pain syndrome    Complication of anesthesia    difficulty waking up from anesthesia   COPD (chronic obstructive pulmonary disease) (HCC)    DDD (degenerative disc disease), lumbar    Depression with anxiety    Diabetes mellitus without complication (HCC)    Dyspnea    Dysrhythmia    PSVT   Fatigue    H/O blood clots    H/O echocardiogram 1989   Per pt, done @ [facility]- Showed mitral valve prolapse   Headache(784.0)    Heart murmur    Hemorrhoids, external    History of hiatal hernia    History of kidney stones    History of PSVT (paroxysmal supraventricular tachycardia)    HTN (hypertension)    Hyperlipidemia    Hypoxemia    Insomnia    Memory loss    Myalgia and myositis    Narcolepsy    On home O2    2-4 L @ HS   OSA (obstructive sleep apnea)    Pneumonia    Sleeping difficulty    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HCC)    Thoracalgia    Tobacco abuse    Tremor    Ulcer disease     Patient Active  Problem List   Diagnosis Date Noted   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   Myelopathy (HCC) 03/10/2021   HNP (herniated nucleus pulposus) with myelopathy, cervical 12/18/2020   Cervical myelopathy (HCC) 08/13/2020   Cervical spinal cord compression (HCC) 08/08/2020   Chronic respiratory failure with hypoxia (HCC) 08/08/2020   Type 2 diabetes mellitus with diabetic polyneuropathy, without long-term current use of insulin (HCC) 08/08/2020   Fibromyalgia 01/06/2019   Neuropathy 01/06/2019   Overweight 03/11/2018   COPD (chronic obstructive pulmonary disease) (HCC) 03/11/2018   Mixed diabetic hyperlipidemia associated with type 2 diabetes mellitus (HCC) 03/11/2018   PTSD (post-traumatic stress disorder) 03/11/2018   GAD (generalized anxiety disorder) 01/30/2014   Essential hypertension, benign 01/30/2014   Hyperlipidemia 08/10/2012   Tobacco abuse 08/10/2012   GERD 12/24/2009    Past Surgical History:  Procedure Laterality Date   ABDOMINAL HYSTERECTOMY     ANTERIOR CERVICAL DECOMP/DISCECTOMY FUSION N/A 12/18/2020   Procedure: CERVICAL TWO-THREE ANTERIOR CERVICAL DISCECTOMY/DECOMPRESSION FUSION;  Surgeon: Donalee Citrin, MD;  Location: [facility] OR;  Service: Neurosurgery;  Laterality: N/A;   APPENDECTOMY  1992   BIOPSY  07/01/2022   Procedure: BIOPSY;  Surgeon: Corbin Ade, MD;  [facility];  Service: Endoscopy;;   CHOLECYSTECTOMY  1992   COLONOSCOPY WITH PROPOFOL N/A 07/01/2022   Procedure: COLONOSCOPY WITH PROPOFOL;  Surgeon: Corbin Ade, MD;  Location: [facility];  Service: Endoscopy;  Laterality: N/A;  12:00 pm   DIAGNOSTIC LAPAROSCOPY  1992   lap chole   ESOPHAGOGASTRODUODENOSCOPY (EGD) WITH PROPOFOL N/A 07/01/2022   Procedure: ESOPHAGOGASTRODUODENOSCOPY (EGD) WITH PROPOFOL;  Surgeon: Corbin Ade, MD;  [facility]: [facility];  Service: Endoscopy;  Laterality: N/A;   HEMOSTASIS CLIP PLACEMENT  07/01/2022   Procedure: HEMOSTASIS CLIP PLACEMENT;  Surgeon:  Corbin Ade, MD;  [facility]: [facility];  Service: Endoscopy;;   HERNIA REPAIR     umbilical... as a child   MALONEY DILATION  07/01/2022   Procedure: MALONEY DILATION;  Surgeon: Corbin Ade, MD;  [facility]: [facility];  Service: Endoscopy;;   OOPHORECTOMY     POLYPECTOMY  07/01/2022   Procedure: POLYPECTOMY;  Surgeon: Corbin Ade, MD;  [facility];  Service: Endoscopy;;   POSTERIOR CERVICAL FUSION/FORAMINOTOMY N/A 08/07/2020   Procedure: POSTERIOR CERVICAL LAMINECTOMY FOR MYELOPATHY;  Surgeon: Donalee Citrin, MD;  Location: [facility] OR;  Service: Neurosurgery;  Laterality: N/A;   POSTERIOR CERVICAL FUSION/FORAMINOTOMY N/A 03/10/2021   Procedure: Posterior cervical fusion with lateral mass fixation Cervical one - cervical four;  Surgeon: Donalee Citrin, MD;  Location: [facility] OR;  Service: Neurosurgery;  Laterality: N/A;   POSTERIOR CERVICAL LAMINECTOMY  08/07/2020   w/ Dr. Wynetta Emery   tonsillectomy     TONSILLECTOMY     TUBAL LIGATION      OB History   No obstetric history on file.      Home Medications    Prior to Admission medications   Medication Sig Start Date End Date Taking? Authorizing Provider  albuterol (VENTOLIN HFA) 108 (90 Base) MCG/ACT inhaler INHALE 2 PUFFS BY MOUTH EVERY 6 HOURS AS NEEDED FOR WHEEZE OR SHORTNESS OF BREATH 04/16/23   Dettinger, Elige Radon, MD  AMITIZA 24 MCG capsule TAKE 1 CAPSULE (24 MCG TOTAL) BY MOUTH 2 (TWO) TIMES DAILY WITH A MEAL. 09/07/22   [name], NP  aspirin EC 81 MG tablet Take 81 mg by mouth daily. Swallow whole.    [provider]  atorvastatin (LIPITOR) 40 MG tablet Take 1 tablet (40 mg total) by mouth daily. 04/16/23   Dettinger, Elige Radon, MD  baclofen (LIORESAL) 10 MG tablet Take 10 mg by mouth 3 (three) times daily as needed for muscle spasms. 06/12/22   [provider]  benzonatate (TESSALON) 100 MG capsule Take 1 capsule (100 mg total) by mouth 3 (three) times daily as needed for cough. Do not take with  alcohol or while driving or operating heavy machinery.  May cause drowsiness. 01/12/23   Valentino Nose, NP  benzonatate (TESSALON) 200 MG capsule Take 1 capsule (200 mg total) by mouth 3 (three) times daily as needed for cough. 07/16/23   Dettinger, Elige Radon, MD  budesonide-formoterol (SYMBICORT) 160-4.5 MCG/ACT inhaler Inhale 2 puffs into the lungs 2 (two) times daily. 04/10/22   Hunsucker, Lesia Sago, MD  buprenorphine (BUTRANS) 5 MCG/HR PTWK Place 1 patch onto the skin once a week.    [provider]  cholecalciferol (VITAMIN D3) 25 MCG (1000 UNIT) tablet Take 1,000 Units by mouth daily.    [provider]  conjugated estrogens (PREMARIN) vaginal cream Pea-sized amount (0.5g)  twice weekly 11/06/22   Myna Hidalgo, DO  Cyanocobalamin (B-12 PO) Take 1 tablet by mouth daily.    [provider]  cyclobenzaprine (FLEXERIL) 10 MG tablet TAKE 1 TABLET BY MOUTH AT BEDTIME AS NEEDED FOR MUSCLE SPASMS. 03/25/23   Dettinger, Elige Radon, MD  DULoxetine (CYMBALTA) 60 MG capsule Take 1 capsule (60 mg total) by mouth daily. TAKE 1 CAPSULE BY MOUTH EVERY DAY 06/26/22   Dettinger, Elige Radon, MD  enalapril (VASOTEC) 5 MG tablet Take 1 tablet (5 mg total) by mouth daily. 04/16/23   Dettinger, Elige Radon, MD  esomeprazole (NEXIUM) 40 MG capsule Take 1 capsule (40 mg total) by mouth daily. 01/08/22   [name], NP  hydrOXYzine (ATARAX) 10 MG tablet TAKE 0.5 TABLETS (5 MG TOTAL) BY MOUTH 2 (TWO) TIMES DAILY AS NEEDED FOR ITCHING. 09/07/22   Mahon, Courtney L, NP  ipratropium-albuterol (DUONEB) 0.5-2.5 (3) MG/3ML SOLN Take 3 mLs by nebulization every 4 (four) hours as needed. 06/11/23   Particia Nearing, PA-C  Multiple Vitamins-Minerals (HAIR SKIN NAILS PO) Take 3 tablets by mouth daily.    [provider]  Multiple Vitamins-Minerals (MULTIVITAMIN WITH MINERALS) tablet Take 1 tablet by mouth daily.    [provider]  mupirocin ointment (BACTROBAN) 2 % Apply to both  nares bid for 5 days 07/08/22   Rourk, Gerrit Friends, MD  nicotine (NICODERM CQ - DOSED IN MG/24 HOURS) 21 mg/24hr patch Place 21 mg onto the skin daily as needed (smoking cessation).    [provider]  nitrofurantoin (MACRODANTIN) 100 MG capsule Take 100 mg by mouth daily.    [provider]  oxyCODONE (OXYCONTIN) 10 mg 12 hr tablet Take 1 tablet (10 mg total) by mouth 2 (two) times daily. 03/14/21   Donalee Citrin, MD  predniSONE (DELTASONE) 20 MG tablet 2 po at same time daily for 5 days 07/16/23   Dettinger, Elige Radon, MD  pregabalin (LYRICA) 150 MG capsule Take 1 capsule (150 mg total) by mouth 3 (three) times daily. 06/26/20   Dettinger, Elige Radon, MD  ramelteon (ROZEREM) 8 MG tablet TAKE 1 TABLET BY MOUTH AT BEDTIME. 07/14/22   Hunsucker, Lesia Sago, MD  ramelteon (ROZEREM) 8 MG tablet TAKE 1 TABLET BY MOUTH EVERYDAY AT BEDTIME 06/09/23   Hunsucker, Lesia Sago, MD  sitaGLIPtin-metformin (JANUMET) 50-1000 MG tablet TAKE 1 TABLET BY MOUTH TWICE A DAY WITH FOOD 04/16/23   Dettinger, Elige Radon, MD  Tiotropium Bromide Monohydrate (SPIRIVA RESPIMAT) 2.5 MCG/ACT AERS Inhale 2 puffs into the lungs daily. 06/22/22   Hunsucker, Lesia Sago, MD    Family History Family History  Problem Relation Age of Onset   Ulcers Mother    Sleep apnea Mother    Migraines Mother    Cancer Mother        uterine   Arthritis Mother    Hypertension Mother    Heart disease Mother    Depression Mother    Hyperlipidemia Mother    Colon cancer Mother    Arrhythmia Son     Social History Social History   Tobacco Use   Smoking status: Every Day    Current packs/day: 2.00    Average packs/day: 2.0 packs/day for 30.0 years (60.0 ttl pk-yrs)    Types: Cigarettes   Smokeless tobacco: Never   Tobacco comments:    Pt states she is down to 1 ppd. 06/22/2022   Vaping Use   Vaping status: Never Used  Substance Use Topics   Alcohol  use: No   Drug use: No     Allergies   Avelox [moxifloxacin], Iohexol, and  Shellfish allergy   Review of Systems Review of Systems Per HPI  Physical Exam Triage Vital Signs ED Triage Vitals  Encounter Vitals Group     BP 08/12/23 1644 117/71     Systolic BP Percentile --      Diastolic BP Percentile --      Pulse Rate 08/12/23 1644 (!) 111     Resp 08/12/23 1644 (!) 26     Temp 08/12/23 1644 97.7 F (36.5 C)     Temp Source 08/12/23 1644 Oral     SpO2 08/12/23 1644 (!) 80 %     Weight --      Height --      Head Circumference --      Peak Flow --      Pain Score 08/12/23 1648 0     Pain Loc --      Pain Education --      Exclude from Growth Chart --    No data found.  Updated Vital Signs BP 117/71 (BP Location: Right Arm)   Pulse (!) 111   Temp 97.7 F (36.5 C) (Oral)   Resp (!) 26   SpO2 (!) 80% Comment: ranging 74-80% on 3 liters [place].  Visual Acuity Right Eye Distance:   Left Eye Distance:   Bilateral Distance:    Right Eye Near:   Left Eye Near:    Bilateral Near:     Exam abbreviated today as decision was made based on unstable vital signs to call EMS Physical Exam Vitals and nursing note reviewed.  Constitutional:      General: She is in acute distress.     Appearance: She is ill-appearing.  HENT:     Right Ear: Tympanic membrane and external ear normal.     Left Ear: Tympanic membrane and external ear normal.  Eyes:     Extraocular Movements: Extraocular movements intact.     Conjunctiva/sclera: Conjunctivae normal.  Cardiovascular:     Rate and Rhythm: Tachycardia present.  Pulmonary:     Comments: Respiratory distress, severe wheezes and restrictive lung sounds bilaterally Musculoskeletal:        General: Normal range of motion.     Cervical back: Normal range of motion.  Skin:    General: Skin is warm and dry.  Neurological:     Mental Status: Mental status is at baseline.  Psychiatric:        Mood and Affect: Mood normal.        Thought Content: Thought content normal.      UC Treatments / Results   Labs (all labs ordered are listed, but only abnormal results are displayed) Labs Reviewed - No data to display  EKG   Radiology No results found.  Procedures Procedures (including critical care time)  Medications Ordered in UC Medications - No data to display  Initial Impression / Assessment and Plan / UC Course  I have reviewed the triage vital signs and the nursing notes.  Pertinent labs & imaging results that were available during my care of the patient were reviewed by me and considered in my medical decision making (see chart for details).     Patient is tachycardic, tachypneic and hypoxic in triage with oxygen saturation between 75 and 80% on 3 L nasal cannula.  She was placed briefly on a nonrebreather at 8 L and oxygen saturations improved to about  92%.  IV was inserted and EMS was called.  EMS arrived and transported patient to [facility] for further evaluation and management.  Final Clinical Impressions(s) / UC Diagnoses   Final diagnoses:  Acute respiratory failure with hypoxia (HCC)  COPD, severe (HCC)   Discharge Instructions   None    ED Prescriptions   None    PDMP not reviewed this encounter.   Particia Nearing, [place] 08/12/23 [phone]

## 2023-08-12 NOTE — ED Notes (Signed)
Pt's O2 sat ranging 86-88% on [place] 3lpm. O2 increased to 4lpm, O2 sat ranging 91-93%.

## 2023-08-12 NOTE — ED Provider Notes (Signed)
[place] EMERGENCY DEPARTMENT AT [facility] Provider Note   CSN: [id number] Arrival date & time: 08/12/23  1729     History  Chief Complaint  Patient presents with   Shortness of Breath    Margaret Vaughn is a 63 y.o. female.  Is here today for worsening shortness of breath for the past week, states she was not able to get care for herself because she has been with her son down to [facility] while he received care.  She is more short of breath today, went to urgent care and was found to be 80% on her baseline 3 L with put on nonrebreather and treated with 1 albuterol treatment with EMS with some mild improvement and route.  She has not received steroids yet.  Patient reports history of asthma severe COPD on baseline 3 L of oxygen for chronic respiratory failure.  States she is still a smoker and smokes "as much as I can".  She states this equates to about a pack and half a day usually.  She reports she has had a cough prior to of yellow and milky white sputum over the past week.  She denies a fever, denies chest pain, denies abdominal pain, admits to constipation.   Reports history of of type 2 diabetes, high cholesterol, diabetes, and syringomyelia      Shortness of Breath      Home Medications Prior to Admission medications   Medication Sig Start Date End Date Taking? Authorizing Provider  nystatin (MYCOSTATIN) 100000 UNIT/ML suspension Take 5 mLs by mouth 4 (four) times daily as needed. 07/09/23  Yes [provider]  ondansetron (ZOFRAN) 4 MG tablet Take 4 mg by mouth 3 (three) times daily as needed. 07/09/23  Yes [provider]  oxyCODONE (ROXICODONE) 15 MG immediate release tablet Take 15 mg by mouth 4 (four) times daily as needed. 08/08/23  Yes [provider]  zolpidem (AMBIEN CR) 6.25 MG CR tablet Take 6.25 mg by mouth at bedtime. 07/09/23  Yes [provider]  albuterol (VENTOLIN HFA) 108 (90 Base) MCG/ACT inhaler INHALE 2  PUFFS BY MOUTH EVERY 6 HOURS AS NEEDED FOR WHEEZE OR SHORTNESS OF BREATH 04/16/23   Dettinger, Elige Radon, MD  AMITIZA 24 MCG capsule TAKE 1 CAPSULE (24 MCG TOTAL) BY MOUTH 2 (TWO) TIMES DAILY WITH A MEAL. 09/07/22   [name], NP  aspirin EC 81 MG tablet Take 81 mg by mouth daily. Swallow whole.    [provider]  atorvastatin (LIPITOR) 40 MG tablet Take 1 tablet (40 mg total) by mouth daily. 04/16/23   Dettinger, Elige Radon, MD  baclofen (LIORESAL) 10 MG tablet Take 10 mg by mouth 3 (three) times daily as needed for muscle spasms. 06/12/22   [provider]  BELBUCA 300 MCG FILM Place 300 mcg inside cheek 2 (two) times daily as needed (pain). 07/09/23   [provider]  benzonatate (TESSALON) 100 MG capsule Take 1 capsule (100 mg total) by mouth 3 (three) times daily as needed for cough. Do not take with alcohol or while driving or operating heavy machinery.  May cause drowsiness. 01/12/23   Valentino Nose, NP  benzonatate (TESSALON) 200 MG capsule Take 1 capsule (200 mg total) by mouth 3 (three) times daily as needed for cough. 07/16/23   Dettinger, Elige Radon, MD  budesonide-formoterol (SYMBICORT) 160-4.5 MCG/ACT inhaler Inhale 2 puffs into the lungs 2 (two) times daily. 04/10/22   Hunsucker, Lesia Sago, MD  Lavera Guise  15 MCG/HR Place 1 patch onto the skin once a week. 04/14/23   [provider]  cholecalciferol (VITAMIN D3) 25 MCG (1000 UNIT) tablet Take 1,000 Units by mouth daily.    [provider]  conjugated estrogens (PREMARIN) vaginal cream Pea-sized amount (0.5g) twice weekly 11/06/22   Myna Hidalgo, DO  Cyanocobalamin (B-12 PO) Take 1 tablet by mouth daily.    [provider]  cyclobenzaprine (FLEXERIL) 10 MG tablet TAKE 1 TABLET BY MOUTH AT BEDTIME AS NEEDED FOR MUSCLE SPASMS. 03/25/23   Dettinger, Elige Radon, MD  DULoxetine (CYMBALTA) 60 MG capsule Take 1 capsule (60 mg total) by mouth daily. TAKE 1 CAPSULE BY MOUTH EVERY DAY 06/26/22    Dettinger, Elige Radon, MD  enalapril (VASOTEC) 5 MG tablet Take 1 tablet (5 mg total) by mouth daily. 04/16/23   Dettinger, Elige Radon, MD  esomeprazole (NEXIUM) 40 MG capsule Take 1 capsule (40 mg total) by mouth daily. 01/08/22   [name], NP  hydrOXYzine (ATARAX) 10 MG tablet TAKE 0.5 TABLETS (5 MG TOTAL) BY MOUTH 2 (TWO) TIMES DAILY AS NEEDED FOR ITCHING. 09/07/22   Mahon, Courtney L, NP  ipratropium-albuterol (DUONEB) 0.5-2.5 (3) MG/3ML SOLN Take 3 mLs by nebulization every 4 (four) hours as needed. 06/11/23   Particia Nearing, PA-C  Multiple Vitamins-Minerals (HAIR SKIN NAILS PO) Take 3 tablets by mouth daily.    [provider]  Multiple Vitamins-Minerals (MULTIVITAMIN WITH MINERALS) tablet Take 1 tablet by mouth daily.    [provider]  mupirocin ointment (BACTROBAN) 2 % Apply to both nares bid for 5 days 07/08/22   Rourk, Gerrit Friends, MD  naloxone [facility]) nasal spray 4 mg/0.1 mL Place 1 spray into the nose once. 08/06/23   [provider]  nicotine (NICODERM CQ - DOSED IN MG/24 HOURS) 21 mg/24hr patch Place 21 mg onto the skin daily as needed (smoking cessation).    [provider]  nitrofurantoin (MACRODANTIN) 100 MG capsule Take 100 mg by mouth daily.    [provider]  predniSONE (DELTASONE) 20 MG tablet 2 po at same time daily for 5 days 07/16/23   Dettinger, Elige Radon, MD  pregabalin (LYRICA) 150 MG capsule Take 1 capsule (150 mg total) by mouth 3 (three) times daily. 06/26/20   Dettinger, Elige Radon, MD  ramelteon (ROZEREM) 8 MG tablet TAKE 1 TABLET BY MOUTH AT BEDTIME. 07/14/22   Hunsucker, Lesia Sago, MD  ramelteon (ROZEREM) 8 MG tablet TAKE 1 TABLET BY MOUTH EVERYDAY AT BEDTIME 06/09/23   Hunsucker, Lesia Sago, MD  sitaGLIPtin-metformin (JANUMET) 50-1000 MG tablet TAKE 1 TABLET BY MOUTH TWICE A DAY WITH FOOD 04/16/23   Dettinger, Elige Radon, MD  Tiotropium Bromide Monohydrate (SPIRIVA RESPIMAT) 2.5 MCG/ACT AERS Inhale 2 puffs into the  lungs daily. 06/22/22   Hunsucker, Lesia Sago, MD      Allergies    Avelox [moxifloxacin], Iohexol, and Shellfish allergy    Review of Systems   Review of Systems  Respiratory:  Positive for shortness of breath.     Physical Exam Updated Vital Signs BP 126/80 (BP Location: Left Arm)   Pulse 90   Temp 98.4 F (36.9 C) (Oral)   Resp 14   Ht 5\' 2"  (1.575 m)   Wt 62.1 kg   SpO2 93%   BMI 25.06 kg/m  Physical Exam Vitals and nursing note reviewed.  Constitutional:      General: She is not in acute distress.    Appearance: She is  well-developed.  HENT:     Head: Normocephalic and atraumatic.  Eyes:     Extraocular Movements: Extraocular movements intact.     Conjunctiva/sclera: Conjunctivae normal.     Pupils: Pupils are equal, round, and reactive to light.  Cardiovascular:     Rate and Rhythm: Normal rate and regular rhythm.     Heart sounds: No murmur heard. Pulmonary:     Effort: Pulmonary effort is normal. No respiratory distress.     Breath sounds: Examination of the right-upper field reveals wheezing. Examination of the left-upper field reveals wheezing. Examination of the right-middle field reveals wheezing. Examination of the left-middle field reveals wheezing. Examination of the right-lower field reveals wheezing and rhonchi. Examination of the left-lower field reveals wheezing and rhonchi. Wheezing and rhonchi present.  Abdominal:     Palpations: Abdomen is soft.     Tenderness: There is no abdominal tenderness.  Musculoskeletal:        General: No swelling.     Cervical back: Neck supple.  Skin:    General: Skin is warm and dry.     Capillary Refill: Capillary refill takes less than 2 seconds.  Neurological:     General: No focal deficit present.     Mental Status: She is alert.  Psychiatric:        Mood and Affect: Mood normal.     ED Results / Procedures / Treatments   Labs (all labs ordered are listed, but only abnormal results are displayed) Labs  Reviewed  BASIC METABOLIC PANEL  BRAIN NATRIURETIC PEPTIDE  CBC WITH DIFFERENTIAL/PLATELET    EKG None  Radiology No results found.  Procedures Procedures    Medications Ordered in ED Medications  ipratropium-albuterol (DUONEB) 0.5-2.5 (3) MG/3ML nebulizer solution 3 mL (has no administration in time range)  methylPREDNISolone sodium succinate (SOLU-MEDROL) 125 mg/2 mL injection 125 mg (has no administration in time range)    ED Course/ Medical Decision Making/ A&P Clinical Course as of 08/12/23 1916  Thu Aug 12, 2023  1753 Patient coming in with increased O2 requirement from urgent care, got DuoNeb en route with improvement, satting 87 to 88% on her baseline 3 L, she is not diaphoretic or having accessory muscle use but is having diffuse wheezing and mild increased work of breathing, will give an additional DuoNeb, steroids and recheck.  Also obtain labs and chest x-ray. [CB]    Clinical Course User Index [CB] Ma Rings, PA-C                                 Medical Decision Making This patient presents to the ED for concern of shortness of breath and wheezing, this involves an extensive number of treatment options, and is a complaint that carries with it a high risk of complications and morbidity.  The differential diagnosis includes COPD exacerbation, pneumonia, PE, pneumothorax, symptomatic anemia, other   Co morbidities that complicate the patient evaluation :   COPD, tobacco abuse, chronic respiratory failure   Additional history obtained:  Additional history obtained from EMR External records from outside source obtained and reviewed including prior notes, labs    Amount and/or Complexity of Data Reviewed Labs: ordered. Radiology: ordered.  Risk Prescription drug management.           Final Clinical Impression(s) / ED Diagnoses Final diagnoses:  None    Rx / DC Orders ED Discharge Orders     None  Josem Kaufmann 08/12/23 1917    Bethann Berkshire, MD 08/14/23 1121

## 2023-08-12 NOTE — ED Provider Notes (Signed)
Patient is a handoff from [place], [place].  In short, patient here with concerns of worsening shortness of breath of the last week.  She was seen at urgent care today and was found to have oxygen saturations in the 80% with her baseline 3 L.  She has been given multiple rounds of nebulized medications with improvement.  Work of breathing improving. Physical Exam  BP 110/82   Pulse (!) 103   Temp 98.4 F (36.9 C) (Oral)   Resp 17   Ht 5\' 2"  (1.575 m)   Wt 62.1 kg   SpO2 (!) 86%   BMI 25.06 kg/m   Physical Exam Vitals and nursing note reviewed.  Constitutional:      General: She is not in acute distress.    Appearance: She is well-developed.     Interventions: She is not intubated. HENT:     Head: Normocephalic and atraumatic.  Eyes:     Extraocular Movements: Extraocular movements intact.     Conjunctiva/sclera: Conjunctivae normal.     Pupils: Pupils are equal, round, and reactive to light.  Cardiovascular:     Rate and Rhythm: Normal rate and regular rhythm.     Heart sounds: No murmur heard. Pulmonary:     Effort: Pulmonary effort is normal. No tachypnea, bradypnea, accessory muscle usage or respiratory distress. She is not intubated.     Breath sounds: No stridor. Examination of the right-upper field reveals wheezing. Examination of the left-upper field reveals wheezing. Examination of the right-middle field reveals wheezing. Examination of the left-middle field reveals wheezing. Examination of the right-lower field reveals wheezing and rhonchi. Examination of the left-lower field reveals wheezing and rhonchi. Wheezing and rhonchi present. No decreased breath sounds.  Abdominal:     Palpations: Abdomen is soft.     Tenderness: There is no abdominal tenderness.  Musculoskeletal:        General: No swelling.     Cervical back: Neck supple.  Skin:    General: Skin is warm and dry.     Capillary Refill: Capillary refill takes less than 2 seconds.  Neurological:     General: No  focal deficit present.     Mental Status: She is alert.  Psychiatric:        Mood and Affect: Mood normal.     Procedures  Procedures  ED Course / MDM   Clinical Course as of 08/12/23 2038  Thu Aug 12, 2023  1753 Patient coming in with increased O2 requirement from urgent care, got DuoNeb en route with improvement, satting 87 to 88% on her baseline 3 L, she is not diaphoretic or having accessory muscle use but is having diffuse wheezing and mild increased work of breathing, will give an additional DuoNeb, steroids and recheck.  Also obtain labs and chest x-ray. [CB]    Clinical Course User Index [CB] Ma Rings, PA-C   Medical Decision Making Amount and/or Complexity of Data Reviewed Labs: ordered. Radiology: ordered.  Risk Prescription drug management.   Patient is a handoff from [place], [place].  In short, patient here with concerns of worsening shortness of breath of the last week.  She was seen at urgent care today and was found to have oxygen saturations in the 80% with her baseline 3 L.  She has been given multiple rounds of nebulized medications with improvement.  Work of breathing improving.  Work up is largely unremarkable.  Patient is positive for RSV.  Chest x-ray does not show any acute cardiopulmonary process  such as pneumonia or other concerns.  However given patient's productive cough with a discolored sputum, will initiate a course of antibiotics as well as oral steroids to continue at home.  Patient's wheezing appears to be somewhat improved and work of breathing improved as well.  Patient continues to have saturation levels somewhere between 90 to 95% on average. Mentation is at baseline and patient able to respond and converse well. Appears that she is stable for discharge home and outpatient follow up. Strict return precautions discussed.       [name], PA-C 08/12/23 2038    Bethann Berkshire, MD 08/14/23 1121

## 2023-08-12 NOTE — ED Notes (Addendum)
EMS at bedside. Report given EMS and called to Jennette Kettle, RN at [facility] ED.

## 2023-08-12 NOTE — ED Notes (Signed)
Patient is being discharged from the Urgent Care and sent to the Emergency Department via EMS . Per PA, patient is in need of higher level of care due to shortness of breath/hypoxia. Patient is aware and verbalizes understanding of plan of care.  Vitals:   08/12/23 1644  BP: 117/71  Resp: (!) 26  SpO2: (!) 80%

## 2023-08-12 NOTE — ED Triage Notes (Signed)
Pt sent by UC, states her O2 was in 80%s on regular 3L so UC put her on 15L per EMS. Pt states she was diagnosed with RSV and treated with antibiotics and steroids somewhere between the "last few weeks and November 2024". Pt c/o cough, nausea, runny nose, SOB.

## 2023-08-13 ENCOUNTER — Other Ambulatory Visit: Payer: Self-pay | Admitting: Family Medicine

## 2023-08-13 DIAGNOSIS — G950 Syringomyelia and syringobulbia: Secondary | ICD-10-CM

## 2023-08-16 ENCOUNTER — Telehealth: Payer: Self-pay | Admitting: Pulmonary Disease

## 2023-08-16 NOTE — Telephone Encounter (Signed)
PT has an issue with [employer]. Recently she changed to a POC. [employer] said she could no have tanks now that she has POC but the POC needs to charge and they have no 02 while it does. PT has COPD and recently RSV. Please call to advise. @ [phone]

## 2023-08-18 ENCOUNTER — Encounter: Payer: Self-pay | Admitting: Pulmonary Disease

## 2023-08-18 ENCOUNTER — Ambulatory Visit: Payer: Medicaid Other | Admitting: Pulmonary Disease

## 2023-08-18 VITALS — BP 130/84 | HR 97 | Ht 64.0 in | Wt 139.8 lb

## 2023-08-18 DIAGNOSIS — G4733 Obstructive sleep apnea (adult) (pediatric): Secondary | ICD-10-CM | POA: Diagnosis not present

## 2023-08-18 DIAGNOSIS — G4700 Insomnia, unspecified: Secondary | ICD-10-CM | POA: Diagnosis not present

## 2023-08-18 DIAGNOSIS — J449 Chronic obstructive pulmonary disease, unspecified: Secondary | ICD-10-CM | POA: Diagnosis not present

## 2023-08-18 DIAGNOSIS — J9611 Chronic respiratory failure with hypoxia: Secondary | ICD-10-CM

## 2023-08-18 MED ORDER — SPIRIVA RESPIMAT 2.5 MCG/ACT IN AERS: 2.0000 | INHALATION_SPRAY | Freq: Every day | RESPIRATORY_TRACT | 11 refills | Status: AC

## 2023-08-18 MED ORDER — BUDESONIDE-FORMOTEROL FUMARATE 160-4.5 MCG/ACT IN AERO: 2.0000 | INHALATION_SPRAY | Freq: Two times a day (BID) | RESPIRATORY_TRACT | 12 refills | Status: AC

## 2023-08-18 MED ORDER — PREDNISONE 10 MG PO TABS: ORAL_TABLET | ORAL | 0 refills | Status: AC

## 2023-08-18 NOTE — Patient Instructions (Signed)
Nice to see you again  To help with residual inflammation after RSV, I prescribed a prednisone taper, take as ordered  I refilled the Symbicort 2 puffs twice a day and Spiriva 2 puffs once daily.  Rinse her mouth out after every use of Symbicort.  We will put a new order and see if we get you a couple of tanks to have in reserve in case your portable oxygen concentrator cannot be used  I recommend contacting [employer] to see if they can take over services via your insurance and if so let me know and we can send an order.  They are often on regulations around oxygen therapy that makes changing companies difficult.  I recommend you contact your current oxygen company and ask if you can get a rechargeable battery for your portable oxygen concentrator.  That way you can be charging 1 battery while using the other and you can switch them out and exchange and allow you to stay out of the house longer.  Return to clinic in 3 months or sooner as needed with Dr. Judeth Horn

## 2023-08-18 NOTE — Progress Notes (Signed)
@Patient  ID: Margaret Vaughn, female    DOB: 05/24/1961, 63 y.o.   MRN: [id number]  Chief Complaint  Patient presents with  . Follow-up    F/U visit. Pt was diagnosed with RSV last week.    Referring provider: Dettinger, Elige Radon, MD  HPI:   63 y.o. woman whom are seen in follow up for dyspnea exertion and COPD.  Most recent PCP note reviewed.  Returns for routine follow-up.  Follow-up as overdue last seen May 2023.  Maintained on high-dose Symbicort and Spiriva via Medicaid formulary.  Doing okay.  Stable oxygen of 3 L POC.  Issues with oxygen concentrator turning off and plugging.  Has no recourse for this when she is out and about cannot recharge etc.  We discussed her allergies to troubleshoot this today.  Will try the orders for small tanks, dubious ocular code we will fill this but can try.  Recently contracted RSV.  Lingering symptoms for many weeks particularly increased cough and congestion.  Prednisone helped some.  But wears off.  Additional prednisone taper today.  HPI at initial visit: Patient was diagnosed with COPD sometime ago.  PFTs performed at pulmonary doctor office in [facility].  Reportedly no medication was prescribed to her given she was still smoking despite her symptoms as well as her diagnosis of COPD on PFTs.  Seen by PCP recently.  Started on inhalers.  Referred here for further evaluation.  She is on low-dose Wixela as well as Spiriva daily.  She does think the medication helps.  Dyspnea bit better.  Her chronic issues include dyspnea on exertion.  Worse on inclines or stairs.  Gradually getting worse.  No time of day when things are better or worse.  No position that makes things better or worse.  No seasonal environmental factors to make things better or worse.  She also has a cough.  Chronic.  Daily.  For years.  Productive of sputum.  Sometimes worse in the mornings.  Again the inhaler seem to be helping some with this.  She has been on oxygen.  Using 2 L.   She has chronic neurologic issues, weakness.  Due to cervical pathology.  Difficult to manipulate small tanks.  Unable to get POC through current oxygen provider.  Walk today.  Qualify for POC.  New order placed to different DME company.  PMH: Tobacco abuse, Hyperlipidemia, GERD, hypertension, migraines Surgical history: Hysterectomy, anterior cervical decompression, appendectomy, hernia repair, tubal ligation Family history: Mother with uterine cancer, CAD, hyperlipidemia Social history: Current smoker, 60-pack-year, trying to reduce.  Quit for 2 months 12 years ago.  Then resumed.  Lives in [facility]   Questionaires / Pulmonary Flowsheets:   ACT:      No data to display          MMRC:     No data to display          Epworth:      No data to display          Tests:   FENO:  No results found for: "NITRICOXIDE"  PFT:     No data to display          WALK:      No data to display          Imaging: Personally reviewed, most recent chest x-ray 2021 clear on my review interpretation DG Chest Port 1 View Result Date: 08/12/2023 CLINICAL DATA:  sob EXAM: PORTABLE CHEST 1 VIEW COMPARISON:  Chest x-ray 06/11/2023 FINDINGS: The heart and mediastinal contours are unchanged. Aortic calcification. No focal consolidation. No pulmonary edema. No pleural effusion. No pneumothorax. No acute osseous abnormality. IMPRESSION: 1. No active disease. 2.  Aortic Atherosclerosis (ICD10-I70.0). Electronically Signed   By: Tish Frederickson M.D.   On: 08/12/2023 18:22    Lab Results: Personally reviewed CBC    Component Value Date/Time   WBC 6.9 08/12/2023 1805   RBC 4.91 08/12/2023 1805   HGB 14.3 08/12/2023 1805   HGB 13.8 04/16/2023 1626   HCT 46.2 (H) 08/12/2023 1805   HCT 43.2 04/16/2023 1626   PLT 261 08/12/2023 1805   PLT 328 04/16/2023 1626   MCV 94.1 08/12/2023 1805   MCV 93 04/16/2023 1626   MCH 29.1 08/12/2023 1805   MCHC 31.0 08/12/2023 1805    RDW 16.1 (H) 08/12/2023 1805   RDW 14.4 04/16/2023 1626   LYMPHSABS 1.3 08/12/2023 1805   LYMPHSABS 2.1 04/16/2023 1626   MONOABS 0.8 08/12/2023 1805   EOSABS 0.1 08/12/2023 1805   EOSABS 0.2 04/16/2023 1626   BASOSABS 0.0 08/12/2023 1805   BASOSABS 0.0 04/16/2023 1626    BMET    Component Value Date/Time   NA 137 08/12/2023 1805   NA 139 07/16/2023 1553   K 4.0 08/12/2023 1805   CL 100 08/12/2023 1805   CO2 30 08/12/2023 1805   GLUCOSE 88 08/12/2023 1805   BUN 17 08/12/2023 1805   BUN 9 07/16/2023 1553   CREATININE 0.69 08/12/2023 1805   CALCIUM 8.6 (L) 08/12/2023 1805   GFRNONAA >60 08/12/2023 1805   GFRAA 112 03/13/2020 1523    BNP    Component Value Date/Time   BNP 78.0 08/12/2023 1805    ProBNP No results found for: "PROBNP"  Specialty Problems       Pulmonary Problems   COPD (chronic obstructive pulmonary disease) (HCC)   Chronic respiratory failure with hypoxia (HCC)    Allergies  Allergen Reactions  . Avelox [Moxifloxacin] Anaphylaxis  . Iohexol Anaphylaxis    Pt given 300 for CT Angio was given 50 mg benadryl prior to scan had no problems. Anaphylaxis?  Marland Kitchen Shellfish Allergy Hives    Immunization History  Administered Date(s) Administered  . Hepatitis B 05/11/1991, 06/08/1991, 11/08/1991  . Influenza, Seasonal, Injecte, Preservative Fre 04/21/2023  . Influenza,inj,Quad PF,6+ Mos 06/26/2020, 06/26/2022  . MMR 08/30/1990  . PFIZER(Purple Top)SARS-COV-2 Vaccination 11/08/2019  . Tdap 08/22/2015, 09/25/2022  . Zoster Recombinant(Shingrix) 12/18/2021, 03/20/2022    Past Medical History:  Diagnosis Date  . Acid reflux   . Allergies   . Anemia   . Asthma   . Bronchitis   . Cervical stenosis of spine   . Chronic pain syndrome   . Complication of anesthesia    difficulty waking up from anesthesia  . COPD (chronic obstructive pulmonary disease) (HCC)   . DDD (degenerative disc disease), lumbar   . Depression with anxiety   . Diabetes  mellitus without complication (HCC)   . Dyspnea   . Dysrhythmia    PSVT  . Fatigue   . H/O blood clots   . H/O echocardiogram 1989   Per pt, done @ [facility]- Showed mitral valve prolapse  . Headache(784.0)   . Heart murmur   . Hemorrhoids, external   . History of hiatal hernia   . History of kidney stones   . History of PSVT (paroxysmal supraventricular tachycardia)   . HTN (hypertension)   . Hyperlipidemia   . Hypoxemia   .  Insomnia   . Memory loss   . Myalgia and myositis   . Narcolepsy   . On home O2    2-4 L @ HS  . OSA (obstructive sleep apnea)   . Pneumonia   . Sleeping difficulty   . 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HCC)   . Thoracalgia   . Tobacco abuse   . Tremor   . Ulcer disease     Tobacco History: Social History   Tobacco Use  Smoking Status Every Day  . Current packs/day: 2.00  . Average packs/day: 2.0 packs/day for 30.0 years (60.0 ttl pk-yrs)  . Types: Cigarettes  Smokeless Tobacco Never  Tobacco Comments   Pt states she is down to 1 ppd. 06/22/2022    Ready to quit: Not Answered Counseling given: Not Answered Tobacco comments: Pt states she is down to 1 ppd. 06/22/2022    Continue to not smoke  Outpatient Encounter Medications as of 08/18/2023  Medication Sig  . predniSONE (DELTASONE) 10 MG tablet Take 4 tablets (40 mg total) by mouth daily with breakfast for 5 days, THEN 2 tablets (20 mg total) daily with breakfast for 5 days, THEN 1 tablet (10 mg total) daily with breakfast for 5 days.  Marland Kitchen albuterol (PROVENTIL) (2.5 MG/3ML) 0.083% nebulizer solution Take 3 mLs (2.5 mg total) by nebulization every 6 (six) hours as needed for wheezing or shortness of breath.  Marland Kitchen albuterol (VENTOLIN HFA) 108 (90 Base) MCG/ACT inhaler INHALE 2 PUFFS BY MOUTH EVERY 6 HOURS AS NEEDED FOR WHEEZE OR SHORTNESS OF BREATH  . AMITIZA 24 MCG capsule TAKE 1 CAPSULE (24 MCG TOTAL) BY MOUTH 2 (TWO) TIMES DAILY WITH A MEAL.  Marland Kitchen aspirin EC 81 MG tablet Take 81 mg by mouth daily.  Swallow whole.  Marland Kitchen atorvastatin (LIPITOR) 40 MG tablet Take 1 tablet (40 mg total) by mouth daily.  . baclofen (LIORESAL) 10 MG tablet Take 10 mg by mouth 3 (three) times daily as needed for muscle spasms.  . BELBUCA 300 MCG FILM Place 300 mcg inside cheek 2 (two) times daily as needed (pain).  . benzonatate (TESSALON) 200 MG capsule Take 1 capsule (200 mg total) by mouth 3 (three) times daily as needed for cough.  . budesonide-formoterol (SYMBICORT) 160-4.5 MCG/ACT inhaler Inhale 2 puffs into the lungs 2 (two) times daily.  . DULoxetine (CYMBALTA) 60 MG capsule Take 1 capsule (60 mg total) by mouth daily. TAKE 1 CAPSULE BY MOUTH EVERY DAY  . enalapril (VASOTEC) 5 MG tablet Take 1 tablet (5 mg total) by mouth daily.  . Multiple Vitamins-Minerals (HAIR SKIN NAILS PO) Take 3 tablets by mouth daily.  . Multiple Vitamins-Minerals (MULTIVITAMIN WITH MINERALS) tablet Take 1 tablet by mouth daily.  . naloxone (NARCAN) nasal spray 4 mg/0.1 mL Place 1 spray into the nose once.  . nicotine (NICODERM CQ - DOSED IN MG/24 HOURS) 21 mg/24hr patch Place 21 mg onto the skin daily as needed (smoking cessation).  . nystatin (MYCOSTATIN) 100000 UNIT/ML suspension Take 5 mLs by mouth 4 (four) times daily as needed (thrush).  . ondansetron (ZOFRAN) 4 MG tablet Take 4 mg by mouth 3 (three) times daily as needed for nausea or vomiting.  Marland Kitchen oxyCODONE (ROXICODONE) 15 MG immediate release tablet Take 15 mg by mouth 4 (four) times daily as needed for pain.  . pregabalin (LYRICA) 150 MG capsule Take 1 capsule (150 mg total) by mouth 3 (three) times daily.  . sitaGLIPtin-metformin (JANUMET) 50-1000 MG tablet TAKE 1 TABLET BY MOUTH TWICE A DAY WITH  FOOD  . Tiotropium Bromide Monohydrate (SPIRIVA RESPIMAT) 2.5 MCG/ACT AERS Inhale 2 puffs into the lungs daily.  Marland Kitchen zolpidem (AMBIEN CR) 6.25 MG CR tablet Take 6.25 mg by mouth at bedtime.  . [DISCONTINUED] budesonide-formoterol (SYMBICORT) 160-4.5 MCG/ACT inhaler Inhale 2 puffs into  the lungs 2 (two) times daily.  . [DISCONTINUED] Tiotropium Bromide Monohydrate (SPIRIVA RESPIMAT) 2.5 MCG/ACT AERS Inhale 2 puffs into the lungs daily.   No facility-administered encounter medications on file as of 08/18/2023.     Review of Systems  Review of Systems  N/a Physical Exam  BP 130/84 (BP Location: Left Arm, Patient Position: Sitting, Cuff Size: Normal)   Pulse 97   Ht 5\' 4"  (1.626 m)   Wt 139 lb 12.8 oz (63.4 kg)   SpO2 93%   BMI 24.00 kg/m   Wt Readings from Last 5 Encounters:  08/18/23 139 lb 12.8 oz (63.4 kg)  08/12/23 137 lb (62.1 kg)  07/16/23 138 lb (62.6 kg)  04/16/23 144 lb 3.2 oz (65.4 kg)  01/01/23 137 lb (62.1 kg)    BMI Readings from Last 5 Encounters:  08/18/23 24.00 kg/m  08/12/23 25.06 kg/m  07/16/23 25.24 kg/m  04/16/23 26.37 kg/m  01/01/23 25.06 kg/m     Physical Exam General: Sitting in chair, no acute distress Eyes: EOMI, icterus Neck: Supple, no JVP Pulmonary: Distant with expiratory wheeze low pitched, clear Cardiovascular: Warm, no edema Abdomen: Nondistended, bowel sounds present MSK: No synovitis, joint effusion Neuro: Normal gait, no weakness Psych: Normal mood, full affect   Assessment & Plan:   Dyspnea on exertion: Likely multifactorial largely given by underlying COPD due to cigarette smoking.  Likely contribution from deconditioning given decreased activity over time.  Improved with alteration of COPD therapies.  COPD with subacute exacerbation due to RSV infection: Suspect severe based on description of her PFTs.  I cannot review these.  Improvement overall with switch from DPI to HFA ICS/LABA, continue high-dose Symbicort, Symbicort Respimat.  Recent decompensation worsening cough with RSV infection likely the last many more weeks.  Prednisone taper today.  Polycythemia: Likely due to chronic hypoxemia.  Stressed importance of oxygen usage at all times with exertion. Improved over time.  Chronic hypoxemic  respiratory failure: Continue oxygen supplementation POC device.  Insomnia: Discussed case with Dr. Aldean Ast in the past..  Recommended ramelteon.  This was prescribed in the past.  Seems like it is NOT helping.  Severe sleep apnea/chronic hypercapnia: Historically BiPAP therapy.  BiPAP machine not working well 2023 but deferred additional workup until sleeping better.  She still cannot sleep.  Likely would benefit from a referral to a sleep doctor once improved from RSV standpoint.  Return in about 3 months (around 11/16/2023) for f/u Dr. Judeth Horn.   Karren Burly, MD 08/18/2023  I spent 41 minutes in care of patient including review of records, face-to-face visit, coordination of care.

## 2023-08-20 ENCOUNTER — Other Ambulatory Visit: Payer: Self-pay | Admitting: Family Medicine

## 2023-08-20 DIAGNOSIS — F411 Generalized anxiety disorder: Secondary | ICD-10-CM

## 2023-08-20 DIAGNOSIS — M797 Fibromyalgia: Secondary | ICD-10-CM

## 2023-08-20 DIAGNOSIS — G4700 Insomnia, unspecified: Secondary | ICD-10-CM | POA: Diagnosis not present

## 2023-08-20 DIAGNOSIS — G4730 Sleep apnea, unspecified: Secondary | ICD-10-CM | POA: Diagnosis not present

## 2023-08-20 DIAGNOSIS — J449 Chronic obstructive pulmonary disease, unspecified: Secondary | ICD-10-CM | POA: Diagnosis not present

## 2023-08-20 DIAGNOSIS — E119 Type 2 diabetes mellitus without complications: Secondary | ICD-10-CM | POA: Diagnosis not present

## 2023-08-20 DIAGNOSIS — M4802 Spinal stenosis, cervical region: Secondary | ICD-10-CM | POA: Diagnosis not present

## 2023-08-20 DIAGNOSIS — G629 Polyneuropathy, unspecified: Secondary | ICD-10-CM | POA: Diagnosis not present

## 2023-08-20 DIAGNOSIS — Z79899 Other long term (current) drug therapy: Secondary | ICD-10-CM | POA: Diagnosis not present

## 2023-08-20 DIAGNOSIS — B370 Candidal stomatitis: Secondary | ICD-10-CM | POA: Diagnosis not present

## 2023-08-20 DIAGNOSIS — G950 Syringomyelia and syringobulbia: Secondary | ICD-10-CM | POA: Diagnosis not present

## 2023-08-20 DIAGNOSIS — G894 Chronic pain syndrome: Secondary | ICD-10-CM | POA: Diagnosis not present

## 2023-08-20 DIAGNOSIS — R110 Nausea: Secondary | ICD-10-CM | POA: Diagnosis not present

## 2023-08-23 ENCOUNTER — Other Ambulatory Visit: Payer: Self-pay | Admitting: *Deleted

## 2023-08-23 NOTE — Patient Instructions (Signed)
Visit Information  Margaret Vaughn was given information about Medicaid Managed Care team care coordination services as a part of their [employer] Medicaid benefit. Margaret Vaughn verbally consented to engagement with the [facility] Managed Care team.   If you are experiencing a medical emergency, please call 911 or report to your local emergency department or urgent care.   If you have a non-emergency medical problem during routine business hours, please contact your provider's office and ask to speak with a nurse.   For questions related to your Healthy [facility] health plan, please call: [phone] or visit the homepage here: [email or web address]  If you would like to schedule transportation through your Healthy [facility] plan, please call the following number at least 2 days in advance of your appointment: [phone]  For information about your ride after you set it up, call Ride Assist at [phone]. Use this number to activate a Will Call pickup, or if your transportation is late for a scheduled pickup. Use this number, too, if you need to make a change or cancel a previously scheduled reservation.  If you need transportation services right away, call [phone]. The after-hours call center is staffed 24 hours to handle ride assistance and urgent reservation requests (including discharges) 365 days a year. Urgent trips include sick visits, hospital discharge requests and life-sustaining treatment.  Call the [facility] Line at [phone], at any time, 24 hours a day, 7 days a week. If you are in danger or need immediate medical attention call 911.  If you would like help to quit smoking, call 1-800-QUIT-NOW ((818)737-2725) OR Espaol: 1-855-Djelo-Ya (3-875-643-3295) o para ms informacin haga clic aqu or Text READY to 188-416 to register via text  Margaret Vaughn,   Please see education materials related to COPD provided by  MyChart link.  Patient verbalizes understanding of instructions and care plan provided today and agrees to view in MyChart. Active MyChart status and patient understanding of how to access instructions and care plan via MyChart confirmed with patient.     Telephone follow up appointment with Managed Medicaid care management team member scheduled for:09/06/23 at 1:15pm  Estanislado Emms RN, BSN [place]  [facility] RN Care Manager [phone]   Following is a copy of your plan of care:  Care Plan : RN Care Manager Plan of Care  Updates made by Heidi Dach, RN since 08/23/2023 12:00 AM     Problem: Health Managment needs related to COPD      Long-Range Goal: Development of Plan of Care to address Health Managment needs related to COPD   Start Date: 08/23/2023  Expected End Date: 11/21/2023  Note:   Current Barriers:  Chronic Disease Management support and education needs related to COPD   RNCM Clinical Goal(s):  Patient will take all medications exactly as prescribed and will call provider for medication related questions as evidenced by patient reports attend all scheduled medical appointments: 10/15/23 with PCP and Pulmonolgy on 11/22/23 as evidenced by provider documentation in EMR continue to work with RN Care Manager to address care management and care coordination needs related to  COPD as evidenced by adherence to CM Team Scheduled appointments through collaboration with RN Care manager, provider, and care team.   Interventions: Evaluation of current treatment plan related to  self management and patient's adherence to plan as established by provider   COPD Interventions:  (Status:  New goal.) Long Term Goal Provided patient with basic written and verbal COPD  education on self care/management/and exacerbation prevention Provided instruction about proper use of medications used for management of COPD including inhalers Advised patient to  self assesses COPD action plan zone and make appointment with provider if in the yellow zone for 48 hours without improvement Provided education about and advised patient to utilize infection prevention strategies to reduce risk of respiratory infection Discussed the importance of adequate rest and management of fatigue with COPD Assessed social determinant of health barriers Collaborated with [employer] regarding patient needing small portable oxygen tank in addition to POC Discussed and advised smoking cessation Discussed LCSW referral for smoking cessation and managing stress-patient will consider and discuss at next outreach Reviewed referral for oxygen tank placed by Pulmonology, advised patient to contact [employer]  Patient Goals/Self-Care Activities: Take all medications as prescribed Attend all scheduled provider appointments Call provider office for new concerns or questions  follow rescue plan if symptoms flare-up keep follow-up appointments: 10/15/23 with PCP and Pulmonology 11/22/23 use an extra pillow to sleep practice relaxation or meditation daily Begin to cut back on smoking  Follow Up Plan:  Telephone follow up appointment with care management team member scheduled for:  09/06/23 at 1:15pm

## 2023-08-23 NOTE — Patient Outreach (Signed)
Medicaid Managed Care   Nurse Care Manager Note  08/23/2023 Name:  Margaret Vaughn MRN:  [id number] DOB:  1960/09/20  Margaret Vaughn is an 63 y.o. year old female who is a primary patient of Dettinger, Elige Radon, MD.  The Medicaid Managed Care Coordination team was consulted for assistance with:    COPD  Ms. Troupe was given information about Medicaid Managed Care Coordination team services today. Margaret Vaughn Patient agreed to services and verbal consent obtained.  Engaged with patient by telephone for initial visit in response to provider referral for case management and/or care coordination services.   Patient is participating in a Managed Medicaid Plan:  Yes  Assessments/Interventions:  Review of past medical history, allergies, medications, health status, including review of consultants reports, laboratory and other test data, was performed as part of comprehensive evaluation and provision of chronic care management services.  SDOH (Social Drivers of Health) assessments and interventions performed: SDOH Interventions    Flowsheet Row Patient Outreach Telephone from 08/23/2023 in [place] [facility] Office Visit from 07/16/2023 in [facility] Office Visit from 04/16/2023 in [facility] Office Visit from 09/25/2022 in [facility] Office Visit from 06/26/2022 in [facility] Office Visit from 03/20/2022 in [facility]  SDOH Interventions        Food Insecurity Interventions Intervention Not Indicated -- -- -- -- --  Housing Interventions Intervention Not Indicated -- -- -- -- --  Transportation Interventions Intervention Not Indicated -- -- -- -- --  Utilities Interventions Intervention Not Indicated -- -- -- -- --  Depression Interventions/Treatment  -- Medication, Currently on Treatment  Medication Medication Medication, Currently on Treatment Medication       Care Plan  Allergies  Allergen Reactions   Avelox [Moxifloxacin] Anaphylaxis   Iohexol Anaphylaxis    Pt given 300 for CT Angio was given 50 mg benadryl prior to scan had no problems. Anaphylaxis?   Shellfish Allergy Hives    Medications Reviewed Today     Reviewed by Heidi Dach, RN (Registered Nurse) on 08/23/23 at 1420  Med List Status: <None>   Medication Order Taking? Sig Documenting Provider Last Dose Status Informant  albuterol (PROVENTIL) (2.5 MG/3ML) 0.083% nebulizer solution [id number] Yes Take 3 mLs (2.5 mg total) by nebulization every 6 (six) hours as needed for wheezing or shortness of breath. [name], PA-C Taking Active   albuterol (VENTOLIN HFA) 108 (90 Base) MCG/ACT inhaler [id number] Yes INHALE 2 PUFFS BY MOUTH EVERY 6 HOURS AS NEEDED FOR WHEEZE OR SHORTNESS OF BREATH Dettinger, Elige Radon, MD Taking Active Self  AMITIZA 24 MCG capsule [id number] Yes TAKE 1 CAPSULE (24 MCG TOTAL) BY MOUTH 2 (TWO) TIMES DAILY WITH A MEAL. Margaret Raider, NP Taking Active Self           Med Note (Tierre Netto A   Mon Aug 23, 2023  2:09 PM) Taking as needed  aspirin EC 81 MG tablet [id number] Yes Take 81 mg by mouth daily. Swallow whole. [provider] Taking Active Self  atorvastatin (LIPITOR) 40 MG tablet [id number] Yes Take 1 tablet (40 mg total) by mouth daily. Dettinger, Elige Radon, MD Taking Active Self  baclofen (LIORESAL) 10 MG tablet [id number] Yes Take 10 mg by mouth 3 (three) times daily as needed for muscle spasms. [provider] Taking Active Self  BELBUCA 300 MCG FILM  [id number] Yes Place 300 mcg inside cheek 2 (two) times daily as needed (pain). [provider] Taking Active Self  benzonatate (TESSALON) 200 MG capsule [id number] Yes Take 1 capsule (200 mg total) by mouth 3 (three) times daily as needed for cough. Dettinger, Elige Radon, MD Taking Active Self   budesonide-formoterol [facility]) 160-4.5 MCG/ACT inhaler [id number] Yes Inhale 2 puffs into the lungs 2 (two) times daily. Hunsucker, Lesia Sago, MD Taking Active   DULoxetine (CYMBALTA) 60 MG capsule [id number] Yes TAKE 1 CAPSULE (60 MG TOTAL) BY MOUTH DAILY. Dettinger, Elige Radon, MD Taking Active   enalapril (VASOTEC) 5 MG tablet [id number] Yes Take 1 tablet (5 mg total) by mouth daily. Dettinger, Elige Radon, MD Taking Active Self  Multiple Vitamins-Minerals (HAIR SKIN NAILS PO) [id number] Yes Take 3 tablets by mouth daily. [provider] Taking Active Self  Multiple Vitamins-Minerals (MULTIVITAMIN WITH MINERALS) tablet [id number] Yes Take 1 tablet by mouth daily. [provider] Taking Active Self  naloxone [facility]) nasal spray 4 mg/0.1 mL [id number]  Place 1 spray into the nose once. [provider]  Active Self           Med Note (Vaughn, Margaret G   Thu Aug 12, 2023  7:29 PM) Never used, has at home  nicotine (NICODERM CQ - DOSED IN MG/24 HOURS) 21 mg/24hr patch [id number] Yes Place 21 mg onto the skin daily as needed (smoking cessation). [provider] Taking Active Self  nystatin (MYCOSTATIN) 100000 UNIT/ML suspension [id number] Yes Take 5 mLs by mouth 4 (four) times daily as needed (thrush). [provider] Taking Active Self  ondansetron (ZOFRAN) 4 MG tablet [id number] Yes Take 4 mg by mouth 3 (three) times daily as needed for nausea or vomiting. [provider] Taking Active Self  oxyCODONE (ROXICODONE) 15 MG immediate release tablet [id number] Yes Take 15 mg by mouth 4 (four) times daily as needed for pain. [provider] Taking Active Self  predniSONE (DELTASONE) 10 MG tablet [id number] Yes Take 4 tablets (40 mg total) by mouth daily with breakfast for 5 days, THEN 2 tablets (20 mg total) daily with breakfast for 5 days, THEN 1 tablet (10 mg total) daily with breakfast for 5 days. Hunsucker, Lesia Sago, MD Taking Active   pregabalin  (LYRICA) 150 MG capsule [id number] Yes Take 1 capsule (150 mg total) by mouth 3 (three) times daily. Dettinger, Elige Radon, MD Taking Active Self  sitaGLIPtin-metformin (JANUMET) 50-1000 MG tablet [id number] Yes TAKE 1 TABLET BY MOUTH TWICE A DAY WITH FOOD Dettinger, Elige Radon, MD Taking Active Self  Tiotropium Bromide Monohydrate (SPIRIVA RESPIMAT) 2.5 MCG/ACT AERS [id number] Yes Inhale 2 puffs into the lungs daily. Hunsucker, Lesia Sago, MD Taking Active   zolpidem (AMBIEN CR) 6.25 MG CR tablet [id number] Yes Take 6.25 mg by mouth at bedtime. 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   Myelopathy (HCC) 03/10/2021   HNP (herniated nucleus pulposus) with myelopathy, cervical 12/18/2020   Cervical myelopathy (HCC) 08/13/2020   Cervical spinal cord compression (HCC) 08/08/2020   Chronic respiratory failure with hypoxia (HCC) 08/08/2020   Type 2 diabetes mellitus with diabetic polyneuropathy, without long-term current use of insulin (HCC) 08/08/2020   Fibromyalgia  01/06/2019   Neuropathy 01/06/2019   Overweight 03/11/2018   COPD (chronic obstructive pulmonary disease) (HCC) 03/11/2018   Mixed diabetic hyperlipidemia associated with type 2 diabetes mellitus (HCC) 03/11/2018   PTSD (post-traumatic stress disorder) 03/11/2018   GAD (generalized anxiety disorder) 01/30/2014   Essential hypertension, benign 01/30/2014   Hyperlipidemia 08/10/2012   Tobacco abuse 08/10/2012   GERD 12/24/2009    Conditions to be addressed/monitored per PCP order:  COPD  Care Plan : RN Care Manager Plan of Care  Updates made by Heidi Dach, RN since 08/23/2023 12:00 AM     Problem: Health Managment needs related to COPD      Long-Range Goal: Development of Plan of Care to address Health Managment needs related  to COPD   Start Date: 08/23/2023  Expected End Date: 11/21/2023  Note:   Current Barriers:  Chronic Disease Management support and education needs related to COPD   RNCM Clinical Goal(s):  Patient will take all medications exactly as prescribed and will call provider for medication related questions as evidenced by patient reports attend all scheduled medical appointments: 10/15/23 with PCP and Pulmonolgy on 11/22/23 as evidenced by provider documentation in EMR continue to work with RN Care Manager to address care management and care coordination needs related to  COPD as evidenced by adherence to CM Team Scheduled appointments through collaboration with RN Care manager, provider, and care team.   Interventions: Evaluation of current treatment plan related to  self management and patient's adherence to plan as established by provider   COPD Interventions:  (Status:  New goal.) Long Term Goal Provided patient with basic written and verbal COPD education on self care/management/and exacerbation prevention Provided instruction about proper use of medications used for management of COPD including inhalers Advised patient to self assesses COPD action plan zone and make appointment with provider if in the yellow zone for 48 hours without improvement Provided education about and advised patient to utilize infection prevention strategies to reduce risk of respiratory infection Discussed the importance of adequate rest and management of fatigue with COPD Assessed social determinant of health barriers Collaborated with [employer] regarding patient needing small portable oxygen tank in addition to POC Discussed and advised smoking cessation Discussed LCSW referral for smoking cessation and managing stress-patient will consider and discuss at next outreach Reviewed referral for oxygen tank placed by Pulmonology, advised patient to contact [employer]  Patient Goals/Self-Care Activities: Take all  medications as prescribed Attend all scheduled provider appointments Call provider office for new concerns or questions  follow rescue plan if symptoms flare-up keep follow-up appointments: 10/15/23 with PCP and Pulmonology 11/22/23 use an extra pillow to sleep practice relaxation or meditation daily Begin to cut back on smoking  Follow Up Plan:  Telephone follow up appointment with care management team member scheduled for:  09/06/23 at 1:15pm      Follow Up:  Patient agrees to Care Plan and Follow-up.  Plan: The Managed Medicaid care management team will reach out to the patient again over the next 14 days.  Date/time of next scheduled RN care management/care coordination outreach:  09/06/23 at 1:15pm  Estanislado Emms RN, BSN [place]  Value-Based Care [facility] RN Care Manager [phone]

## 2023-08-24 DIAGNOSIS — Z79899 Other long term (current) drug therapy: Secondary | ICD-10-CM | POA: Diagnosis not present

## 2023-08-24 NOTE — Telephone Encounter (Signed)
Called the pt and there was no answer and her VM was full so was not able to leave msg  Will try back later

## 2023-08-25 NOTE — Telephone Encounter (Signed)
I called the pt again and still no answer and no way to leave msg bc her mailbox is full  Closing encounter per protocol

## 2023-08-28 DIAGNOSIS — J449 Chronic obstructive pulmonary disease, unspecified: Secondary | ICD-10-CM | POA: Diagnosis not present

## 2023-09-06 ENCOUNTER — Other Ambulatory Visit: Payer: Self-pay | Admitting: *Deleted

## 2023-09-06 NOTE — Patient Outreach (Signed)
 Medicaid Managed Care   Nurse Care Manager Note  09/06/2023 Name:  Margaret Vaughn MRN:  [id number] DOB:  [birth year]  Margaret Vaughn is an 63 y.o. year old female who is a primary patient of Dettinger, Lucio Sabin, MD.  The Medicaid Managed Care Coordination team was consulted for assistance with:    COPD  Ms. Kappeler was given information about Medicaid Managed Care Coordination team services today. Margaret Vaughn Patient agreed to services and verbal consent obtained.  Engaged with patient by telephone for follow up visit in response to provider referral for case management and/or care coordination services.   Patient is participating in a Managed Medicaid Plan:  Yes  Assessments/Interventions:  Review of past medical history, allergies, medications, health status, including review of consultants reports, laboratory and other test data, was performed as part of comprehensive evaluation and provision of chronic care management services.  SDOH (Social Drivers of Health) assessments and interventions performed: SDOH Interventions    Flowsheet Row Patient Outreach Telephone from 08/23/2023 in [place] [facility] POP[facility] DEPARTMENT Office Visit from 07/16/2023 in [facility] Office Visit from 04/16/2023 in [facility] Office Visit from 09/25/2022 in [facility] Office Visit from 06/26/2022 in [facility] Office Visit from 03/20/2022 in [facility]  SDOH Interventions        Food Insecurity Interventions Intervention Not Indicated -- -- -- -- --  Housing Interventions Intervention Not Indicated -- -- -- -- --  Transportation Interventions Intervention Not Indicated -- -- -- -- --  Utilities Interventions Intervention Not Indicated -- -- -- -- --  Depression Interventions/Treatment  -- Medication, Currently on Treatment  Medication Medication Medication, Currently on Treatment Medication       Care Plan  Allergies  Allergen Reactions   Avelox [Moxifloxacin] Anaphylaxis   Iohexol  Anaphylaxis    Pt given 300 for CT Angio was given 50 mg benadryl  prior to scan had no problems. Anaphylaxis?   Shellfish Allergy Hives    Medications Reviewed Today     Reviewed by [name], RN (Registered Nurse) on 09/06/23 at 1324  Med List Status: <None>   Medication Order Taking? Sig Documenting Provider Last Dose Status Informant  albuterol  (PROVENTIL ) (2.5 MG/3ML) 0.083% nebulizer solution [id number] Yes Take 3 mLs (2.5 mg total) by nebulization every 6 (six) hours as needed for wheezing or shortness of breath. Zelaya, Oscar A, PA-C Taking Active   albuterol  (VENTOLIN  HFA) 108 (90 Base) MCG/ACT inhaler [id number] Yes INHALE 2 PUFFS BY MOUTH EVERY 6 HOURS AS NEEDED FOR WHEEZE OR SHORTNESS OF BREATH Dettinger, Lucio Sabin, MD Taking Active Self  AMITIZA  24 MCG capsule [id number] Yes TAKE 1 CAPSULE (24 MCG TOTAL) BY MOUTH 2 (TWO) TIMES DAILY WITH A MEAL. Margaret Highman, NP Taking Active Self           Med Note ([name]   Mon Aug 23, 2023  2:09 PM) Taking as needed  aspirin EC 81 MG tablet [id number] Yes Take 81 mg by mouth daily. Swallow whole. [provider] Taking Active Self  atorvastatin  (LIPITOR) 40 MG tablet [id number] Yes Take 1 tablet (40 mg total) by mouth daily. Dettinger, Lucio Sabin, MD Taking Active Self  baclofen  (LIORESAL ) 10 MG tablet [id number] Yes Take 10 mg by mouth 3 (three) times daily as needed for muscle spasms. [provider] Taking Active Self  BELBUCA 300 MCG  FILM [id number] Yes Place 300 mcg inside cheek 2 (two) times daily as needed (pain). [provider] Taking Active Self  benzonatate  (TESSALON ) 200 MG capsule [id number] Yes Take 1 capsule (200 mg total) by mouth 3 (three) times daily as needed for cough. Dettinger, Lucio Sabin, MD Taking Active Self   budesonide -formoterol  (SYMBICORT ) 160-4.5 MCG/ACT inhaler [id number] Yes Inhale 2 puffs into the lungs 2 (two) times daily. Hunsucker, Archer Kobs, MD Taking Active   DULoxetine  (CYMBALTA ) 60 MG capsule [id number] Yes TAKE 1 CAPSULE (60 MG TOTAL) BY MOUTH DAILY. Dettinger, Lucio Sabin, MD Taking Active   enalapril  (VASOTEC ) 5 MG tablet [id number] Yes Take 1 tablet (5 mg total) by mouth daily. Dettinger, Lucio Sabin, MD Taking Active Self  Multiple Vitamins-Minerals (HAIR SKIN NAILS PO) [id number] Yes Take 3 tablets by mouth daily. [provider] Taking Active Self  Multiple Vitamins-Minerals (MULTIVITAMIN WITH MINERALS) tablet [id number] No Take 1 tablet by mouth daily.  Patient not taking: Reported on 09/06/2023   [provider] Not Taking Active Self  naloxone  (NARCAN ) nasal spray 4 mg/0.1 mL [id number]  Place 1 spray into the nose once. [provider]  Active Self           Med Note (Vaughn, Margaret G   Thu Aug 12, 2023  7:29 PM) Never used, has at home  nicotine  (NICODERM CQ  - DOSED IN MG/24 HOURS) 21 mg/24hr patch [id number] Yes Place 21 mg onto the skin daily as needed (smoking cessation). [provider] Taking Active Self  nystatin  (MYCOSTATIN ) 100000 UNIT/ML suspension [id number] Yes Take 5 mLs by mouth 4 (four) times daily as needed (thrush). [provider] Taking Active Self  ondansetron  (ZOFRAN ) 4 MG tablet [id number] Yes Take 4 mg by mouth 3 (three) times daily as needed for nausea or vomiting. [provider] Taking Active Self  oxyCODONE  (ROXICODONE ) 15 MG immediate release tablet [id number] Yes Take 15 mg by mouth 4 (four) times daily as needed for pain. [provider] Taking Active Self  pregabalin  (LYRICA ) 150 MG capsule [id number] Yes Take 1 capsule (150 mg total) by mouth 3 (three) times daily. Dettinger, Lucio Sabin, MD Taking Active Self  sitaGLIPtin -metformin  (JANUMET ) 50-1000 MG tablet [id number] Yes TAKE 1 TABLET BY MOUTH TWICE A  DAY WITH FOOD Dettinger, Lucio Sabin, MD Taking Active Self  Tiotropium Bromide  Monohydrate (SPIRIVA  RESPIMAT) 2.5 MCG/ACT AERS [id number] Yes Inhale 2 puffs into the lungs daily. Hunsucker, Archer Kobs, MD Taking Active   zolpidem (AMBIEN CR) 6.25 MG CR tablet [id number] No Take 6.25 mg by mouth at bedtime.  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   Myelopathy (HCC) 03/10/2021   HNP (herniated nucleus pulposus) with myelopathy, cervical 12/18/2020   Cervical myelopathy (HCC) 08/13/2020   Cervical spinal cord compression (HCC) 08/08/2020   Chronic respiratory failure with hypoxia (HCC) 08/08/2020   Type 2 diabetes mellitus with diabetic polyneuropathy, without long-term current use of insulin  (HCC) 08/08/2020   Fibromyalgia 01/06/2019   Neuropathy 01/06/2019   Overweight 03/11/2018   COPD (chronic obstructive pulmonary disease) (HCC) 03/11/2018   Mixed diabetic hyperlipidemia associated with type 2 diabetes mellitus (HCC) 03/11/2018  PTSD (post-traumatic stress disorder) 03/11/2018   GAD (generalized anxiety disorder) 01/30/2014   Essential hypertension, benign 01/30/2014   Hyperlipidemia 08/10/2012   Tobacco abuse 08/10/2012   GERD 12/24/2009    Conditions to be addressed/monitored per PCP order:  COPD  Care Plan : RN Care Manager Plan of Care  Updates made by [name], RN since 09/06/2023 12:00 AM     Problem: Health Managment needs related to COPD      Long-Range Goal: Development of Plan of Care to address Health Managment needs related to COPD   Start Date: 08/23/2023  Expected End Date: 11/21/2023  Note:   Current Barriers:  Chronic Disease Management support and education needs related to COPD   RNCM Clinical  Goal(s):  Patient will take all medications exactly as prescribed and will call provider for medication related questions as evidenced by patient reports attend all scheduled medical appointments: 10/15/23 with PCP and Pulmonolgy on 11/22/23 as evidenced by provider documentation in EMR continue to work with RN Care Manager to address care management and care coordination needs related to  COPD as evidenced by adherence to CM Team Scheduled appointments through collaboration with RN Care manager, provider, and care team.   Interventions: Evaluation of current treatment plan related to  self management and patient's adherence to plan as established by provider   COPD Interventions:  (Status:  Goal on track:  Yes.) Long Term Goal Provided patient with basic written and verbal COPD education on self care/management/and exacerbation prevention Provided instruction about proper use of medications used for management of COPD including inhalers Advised patient to self assesses COPD action plan zone and make appointment with provider if in the yellow zone for 48 hours without improvement Provided education about and advised patient to utilize infection prevention strategies to reduce risk of respiratory infection Discussed the importance of adequate rest and management of fatigue with COPD Assessed social determinant of health barriers Collaborated with [employer] regarding patient needing small portable oxygen  tank in addition to [employer] email to [employer] Teacher with [employer] Discussed and advised smoking cessation-patient declines at this time Discussed LCSW referral for smoking cessation and managing stress-patient declines Reviewed referral for oxygen  tank placed by Pulmonology, advised patient to contact [employer]-patient has not contacted [employer] Assisted patient with contacting [employer] [phone] regarding order placed by Dr. Gae Jointer message, [name] will return call to patient  today  Patient Goals/Self-Care Activities: Take all medications as prescribed Attend all scheduled provider appointments Call provider office for new concerns or questions  follow rescue plan if symptoms flare-up keep follow-up appointments: 10/15/23 with PCP and Pulmonology 11/22/23 use an extra pillow to sleep practice relaxation or meditation daily Begin to cut back on smoking  Follow Up Plan:  Telephone follow up appointment with care management team member scheduled for:  10/06/23 at 1:15pm      Follow Up:  Patient agrees to Care Plan and Follow-up.  Plan: The Managed Medicaid care management team will reach out to the patient again over the next 30 days.  Date/time of next scheduled RN care management/care coordination outreach:  10/06/23 at 1:15pm  Arna Better RN, BSN [place]  Value-Based Care [facility] RN Care Manager [phone]

## 2023-09-06 NOTE — Patient Instructions (Signed)
 Visit Information  [name] was given information about Medicaid Managed Care team care coordination services as a part of their [employer] Medicaid benefit. Margaret Vaughn verbally consented to engagement with the [facility] Managed Care team.   If you are experiencing a medical emergency, please call 911 or report to your local emergency department or urgent care.   If you have a non-emergency medical problem during routine business hours, please contact your provider's office and ask to speak with a nurse.   For questions related to your [facility] health plan, please call: [phone] or visit the homepage here: [email or web address]  If you would like to schedule transportation through your Healthy [facility] plan, please call the following number at least 2 days in advance of your appointment: [phone]  For information about your ride after you set it up, call Ride Assist at [phone]. Use this number to activate a Will Call pickup, or if your transportation is late for a scheduled pickup. Use this number, too, if you need to make a change or cancel a previously scheduled reservation.  If you need transportation services right away, call [phone]. The after-hours call center is staffed 24 hours to handle ride assistance and urgent reservation requests (including discharges) 365 days a year. Urgent trips include sick visits, hospital discharge requests and life-sustaining treatment.  Call the [facility] Line at [phone], at any time, 24 hours a day, 7 days a week. If you are in danger or need immediate medical attention call 911.  If you would like help to quit smoking, call 1-800-QUIT-NOW ((850) 771-6076) OR Espaol: 1-855-Djelo-Ya (4-742-595-6387) o para ms informacin haga clic aqu or Text READY to 564-332 to register via text  Margaret Vaughn,   Please see education materials related to COPD provided by  MyChart link.  Patient verbalizes understanding of instructions and care plan provided today and agrees to view in MyChart. Active MyChart status and patient understanding of how to access instructions and care plan via MyChart confirmed with patient.     Telephone follow up appointment with Managed Medicaid care management team member scheduled for:10/06/23 at 1:15pm  Arna Better RN, BSN   Value-Based Care [facility] RN Care Manager [phone]   Following is a copy of your plan of care:  Care Plan : RN Care Manager Plan of Care  Updates made by [name], RN since 09/06/2023 12:00 AM     Problem: Health Managment needs related to COPD      Long-Range Goal: Development of Plan of Care to address Health Managment needs related to COPD   Start Date: 08/23/2023  Expected End Date: 11/21/2023  Note:   Current Barriers:  Chronic Disease Management support and education needs related to COPD   RNCM Clinical Goal(s):  Patient will take all medications exactly as prescribed and will call provider for medication related questions as evidenced by patient reports attend all scheduled medical appointments: 10/15/23 with PCP and Pulmonolgy on 11/22/23 as evidenced by provider documentation in EMR continue to work with RN Care Manager to address care management and care coordination needs related to  COPD as evidenced by adherence to CM Team Scheduled appointments through collaboration with RN Care manager, provider, and care team.   Interventions: Evaluation of current treatment plan related to  self management and patient's adherence to plan as established by provider   COPD Interventions:  (Status:  Goal on track:  Yes.) Long Term Goal Provided patient with basic written  and verbal COPD education on self care/management/and exacerbation prevention Provided instruction about proper use of medications used for management of COPD including inhalers Advised  patient to self assesses COPD action plan zone and make appointment with provider if in the yellow zone for 48 hours without improvement Provided education about and advised patient to utilize infection prevention strategies to reduce risk of respiratory infection Discussed the importance of adequate rest and management of fatigue with COPD Assessed social determinant of health barriers Collaborated with [employer] regarding patient needing small portable oxygen  tank in addition to [employer] email to [employer] Teacher with [employer] Discussed and advised smoking cessation-patient declines at this time Discussed LCSW referral for smoking cessation and managing stress-patient declines Reviewed referral for oxygen  tank placed by Pulmonology, advised patient to contact [facility]-patient has not contacted [facility] Assisted patient with contacting [facility] [phone] regarding order placed by Dr. Gae Jointer message, [name] will return call to patient today  Patient Goals/Self-Care Activities: Take all medications as prescribed Attend all scheduled provider appointments Call provider office for new concerns or questions  follow rescue plan if symptoms flare-up keep follow-up appointments: 10/15/23 with PCP and Pulmonology 11/22/23 use an extra pillow to sleep practice relaxation or meditation daily Begin to cut back on smoking  Follow Up Plan:  Telephone follow up appointment with care management team member scheduled for:  10/06/23 at 1:15pm

## 2023-09-07 ENCOUNTER — Other Ambulatory Visit: Payer: Self-pay

## 2023-09-07 ENCOUNTER — Emergency Department (HOSPITAL_COMMUNITY): Payer: Medicaid Other

## 2023-09-07 ENCOUNTER — Emergency Department (HOSPITAL_COMMUNITY)
Admission: EM | Admit: 2023-09-07 | Discharge: 2023-09-07 | Disposition: A | Discharge status: Home / Self Care | Payer: Medicaid Other | Attending: Emergency Medicine | Admitting: Emergency Medicine

## 2023-09-07 ENCOUNTER — Encounter (HOSPITAL_COMMUNITY): Payer: Self-pay

## 2023-09-07 DIAGNOSIS — Z23 Encounter for immunization: Secondary | ICD-10-CM | POA: Diagnosis not present

## 2023-09-07 DIAGNOSIS — S060X0A Concussion without loss of consciousness, initial encounter: Secondary | ICD-10-CM | POA: Insufficient documentation

## 2023-09-07 DIAGNOSIS — S0101XA Laceration without foreign body of scalp, initial encounter: Secondary | ICD-10-CM | POA: Insufficient documentation

## 2023-09-07 DIAGNOSIS — R58 Hemorrhage, not elsewhere classified: Secondary | ICD-10-CM | POA: Diagnosis not present

## 2023-09-07 DIAGNOSIS — R55 Syncope and collapse: Secondary | ICD-10-CM | POA: Diagnosis not present

## 2023-09-07 DIAGNOSIS — W01198A Fall on same level from slipping, tripping and stumbling with subsequent striking against other object, initial encounter: Secondary | ICD-10-CM | POA: Insufficient documentation

## 2023-09-07 DIAGNOSIS — S060X1A Concussion with loss of consciousness of 30 minutes or less, initial encounter: Secondary | ICD-10-CM | POA: Diagnosis not present

## 2023-09-07 DIAGNOSIS — S0990XA Unspecified injury of head, initial encounter: Secondary | ICD-10-CM | POA: Diagnosis not present

## 2023-09-07 DIAGNOSIS — J449 Chronic obstructive pulmonary disease, unspecified: Secondary | ICD-10-CM | POA: Insufficient documentation

## 2023-09-07 DIAGNOSIS — E119 Type 2 diabetes mellitus without complications: Secondary | ICD-10-CM | POA: Diagnosis not present

## 2023-09-07 LAB — CBC
HCT: 34.2 % — ABNORMAL LOW (ref 36.0–46.0)
HCT: 30.9 % — ABNORMAL LOW (ref 36.0–46.0)
Hemoglobin: 10.7 g/dL — ABNORMAL LOW (ref 12.0–15.0)
Hemoglobin: 9.9 g/dL — ABNORMAL LOW (ref 12.0–15.0)
MCH: 29.5 pg (ref 26.0–34.0)
MCH: 30.4 pg (ref 26.0–34.0)
MCHC: 31.3 g/dL (ref 30.0–36.0)
MCHC: 32 g/dL (ref 30.0–36.0)
MCV: 94.2 fL (ref 80.0–100.0)
MCV: 94.8 fL (ref 80.0–100.0)
Platelets: 250 10*3/uL (ref 150–400)
Platelets: 223 10*3/uL (ref 150–400)
RBC: 3.63 MIL/uL — ABNORMAL LOW (ref 3.87–5.11)
RBC: 3.26 MIL/uL — ABNORMAL LOW (ref 3.87–5.11)
RDW: 17 % — ABNORMAL HIGH (ref 11.5–15.5)
RDW: 17 % — ABNORMAL HIGH (ref 11.5–15.5)
WBC: 9.6 10*3/uL (ref 4.0–10.5)
WBC: 9.8 10*3/uL (ref 4.0–10.5)
nRBC: 0 % (ref 0.0–0.2)
nRBC: 0 % (ref 0.0–0.2)

## 2023-09-07 LAB — BASIC METABOLIC PANEL
Anion gap: 8 (ref 5–15)
BUN: 13 mg/dL (ref 8–23)
CO2: 32 mmol/L (ref 22–32)
Calcium: 8.5 mg/dL — ABNORMAL LOW (ref 8.9–10.3)
Chloride: 99 mmol/L (ref 98–111)
Creatinine, Ser: 0.74 mg/dL (ref 0.44–1.00)
GFR, Estimated: >60 mL/min (ref >60)
Glucose, Bld: 121 mg/dL — ABNORMAL HIGH (ref 70–99)
Potassium: 4 mmol/L (ref 3.5–5.1)
Sodium: 139 mmol/L (ref 135–145)

## 2023-09-07 LAB — CBG MONITORING, ED: Glucose-Capillary: 105 mg/dL — ABNORMAL HIGH (ref 70–99)

## 2023-09-07 MED ORDER — TETANUS-DIPHTH-ACELL PERTUSSIS 5-2.5-18.5 LF-MCG/0.5 IM SUSY
0.5000 mL | PREFILLED_SYRINGE | Freq: Once | INTRAMUSCULAR | Status: AC
  Administered 2023-09-07: 0.5 mL via INTRAMUSCULAR
  Filled 2023-09-07: qty 0.5

## 2023-09-07 MED ORDER — ACETAMINOPHEN 325 MG PO TABS
650.0000 mg | ORAL_TABLET | Freq: Once | ORAL | Status: AC
  Administered 2023-09-07: 650 mg via ORAL
  Filled 2023-09-07: qty 2

## 2023-09-07 MED ORDER — LIDOCAINE-EPINEPHRINE-TETRACAINE (LET) TOPICAL GEL
3.0000 mL | Freq: Once | TOPICAL | Status: AC
  Administered 2023-09-07: 3 mL via TOPICAL
  Filled 2023-09-07: qty 3

## 2023-09-07 MED ORDER — LACTATED RINGERS IV BOLUS
500.0000 mL | Freq: Once | INTRAVENOUS | Status: AC
  Administered 2023-09-07: 500 mL via INTRAVENOUS

## 2023-09-07 NOTE — Discharge Instructions (Addendum)
You were seen in the emergency room for near fainting. CT scan of your brain is reassuring. Your hemoglobin has dropped about 4-1/2 g since January.  You do not suspect that you are having any bloody stools and are comfortable going home at this time.  We suspect that he will likely be dizzy, woozy.  There is a possibility of concussion as well.  Your symptoms should start getting better over the next few days.  Please take precautions to prevent any falls.  Hydrate well.  Return to the ER if your symptoms get worse.  Follow-up with your primary care doctor in 1 week for repeat blood work to make sure that your hemoglobin is stabilized.

## 2023-09-07 NOTE — ED Notes (Signed)
Pt presents with a 2in laceration to right scalp. Bleeding controlled in Triage.

## 2023-09-07 NOTE — ED Triage Notes (Signed)
Pt arrived via POV c/o head injury following a syncopal episode. Pt reports she bent down to pick something up and upon standing, she felt weak, dizzy and fell to the floor, striking her head on a 2X4 piece of lumber. Pt reports losing a lot of blood.

## 2023-09-07 NOTE — ED Provider Notes (Addendum)
[place] EMERGENCY DEPARTMENT AT [facility] Provider Note   CSN: [id number] Arrival date & time: 09/07/23  8295     History  Chief Complaint  Patient presents with   Loss of Consciousness    Margaret Vaughn is a 63 y.o. female.  HPI     63 year old female comes in with chief complaint of syncopal episode.  Patient states that she has been congested the last few days.  She had blown her nose, bent over to pick something up, upon standing up she started getting dizzy and then she fell down.  She struck her face onto 2 x 4.  She started having significant bleeding immediately.  At no point did patient lose consciousness.  She remembers the fall.  Patient thinks that she lost at least 500 cc of blood.  Now she feels dizzy when she sits up.  Patient denies injury elsewhere.  Specifically she has no neck pain that is new, chest pain, abdominal pain, back pain, pelvic pain.  Patient has past medical history of COPD on 3 L of oxygen, diabetes, myelopathy.  Home Medications Prior to Admission medications   Medication Sig Start Date End Date Taking? Authorizing Provider  albuterol (PROVENTIL) (2.5 MG/3ML) 0.083% nebulizer solution Take 3 mLs (2.5 mg total) by nebulization every 6 (six) hours as needed for wheezing or shortness of breath. 08/12/23   [name], PA-C  albuterol (VENTOLIN HFA) 108 (90 Base) MCG/ACT inhaler INHALE 2 PUFFS BY MOUTH EVERY 6 HOURS AS NEEDED FOR WHEEZE OR SHORTNESS OF BREATH 04/16/23   Dettinger, Elige Radon, MD  AMITIZA 24 MCG capsule TAKE 1 CAPSULE (24 MCG TOTAL) BY MOUTH 2 (TWO) TIMES DAILY WITH A MEAL. 09/07/22   [name], NP  aspirin EC 81 MG tablet Take 81 mg by mouth daily. Swallow whole.    [provider]  atorvastatin (LIPITOR) 40 MG tablet Take 1 tablet (40 mg total) by mouth daily. 04/16/23   Dettinger, Elige Radon, MD  baclofen (LIORESAL) 10 MG tablet Take 10 mg by mouth 3 (three) times daily as needed for muscle spasms.  06/12/22   [provider]  BELBUCA 300 MCG FILM Place 300 mcg inside cheek 2 (two) times daily as needed (pain). 07/09/23   [provider]  benzonatate (TESSALON) 200 MG capsule Take 1 capsule (200 mg total) by mouth 3 (three) times daily as needed for cough. 07/16/23   Dettinger, Elige Radon, MD  budesonide-formoterol (SYMBICORT) 160-4.5 MCG/ACT inhaler Inhale 2 puffs into the lungs 2 (two) times daily. 08/18/23   Hunsucker, Lesia Sago, MD  DULoxetine (CYMBALTA) 60 MG capsule TAKE 1 CAPSULE (60 MG TOTAL) BY MOUTH DAILY. 08/20/23   Dettinger, Elige Radon, MD  enalapril (VASOTEC) 5 MG tablet Take 1 tablet (5 mg total) by mouth daily. 04/16/23   Dettinger, Elige Radon, MD  Multiple Vitamins-Minerals (HAIR SKIN NAILS PO) Take 3 tablets by mouth daily.    [provider]  Multiple Vitamins-Minerals (MULTIVITAMIN WITH MINERALS) tablet Take 1 tablet by mouth daily. Patient not taking: Reported on 09/06/2023    [provider]  naloxone [facility]) nasal spray 4 mg/0.1 mL Place 1 spray into the nose once. 08/06/23   [provider]  nicotine (NICODERM CQ - DOSED IN MG/24 HOURS) 21 mg/24hr patch Place 21 mg onto the skin daily as needed (smoking cessation).    [provider]  nystatin (MYCOSTATIN) 100000 UNIT/ML suspension Take 5 mLs by mouth 4 (four) times daily as needed (  thrush). 07/09/23   [provider]  ondansetron (ZOFRAN) 4 MG tablet Take 4 mg by mouth 3 (three) times daily as needed for nausea or vomiting. 07/09/23   [provider]  oxyCODONE (ROXICODONE) 15 MG immediate release tablet Take 15 mg by mouth 4 (four) times daily as needed for pain. 08/08/23   [provider]  pregabalin (LYRICA) 150 MG capsule Take 1 capsule (150 mg total) by mouth 3 (three) times daily. 06/26/20   Dettinger, Elige Radon, MD  sitaGLIPtin-metformin (JANUMET) 50-1000 MG tablet TAKE 1 TABLET BY MOUTH TWICE A DAY WITH FOOD 04/16/23   Dettinger, Elige Radon, MD   Tiotropium Bromide Monohydrate (SPIRIVA RESPIMAT) 2.5 MCG/ACT AERS Inhale 2 puffs into the lungs daily. 08/18/23   Hunsucker, Lesia Sago, MD  zolpidem (AMBIEN CR) 6.25 MG CR tablet Take 6.25 mg by mouth at bedtime. Patient not taking: Reported on 09/06/2023 07/09/23   [provider]      Allergies    Avelox [moxifloxacin], Iohexol, and Shellfish allergy    Review of Systems   Review of Systems  Physical Exam Updated Vital Signs BP 124/81   Pulse 93   Temp 97.8 F (36.6 C) (Oral)   Resp 15   Ht 5\' 4"  (1.626 m)   Wt 63.4 kg   SpO2 100%   BMI 24.00 kg/m  Physical Exam Vitals and nursing note reviewed.  Constitutional:      Appearance: She is well-developed.  HENT:     Head: Atraumatic.  Cardiovascular:     Rate and Rhythm: Normal rate.  Pulmonary:     Effort: Pulmonary effort is normal.  Musculoskeletal:     Cervical back: Normal range of motion and neck supple.  Skin:    General: Skin is warm and dry.     Comments: 9 cm laceration to the right parietal region  Neurological:     Mental Status: She is alert and oriented to person, place, and time.     ED Results / Procedures / Treatments   Labs (all labs ordered are listed, but only abnormal results are displayed) Labs Reviewed  BASIC METABOLIC PANEL - Abnormal; Notable for the following components:      Result Value   Glucose, Bld 121 (*)    Calcium 8.5 (*)    All other components within normal limits  CBC - Abnormal; Notable for the following components:   RBC 3.63 (*)    Hemoglobin 10.7 (*)    HCT 34.2 (*)    RDW 17.0 (*)    All other components within normal limits  CBC - Abnormal; Notable for the following components:   RBC 3.26 (*)    Hemoglobin 9.9 (*)    HCT 30.9 (*)    RDW 17.0 (*)    All other components within normal limits  CBG MONITORING, ED - Abnormal; Notable for the following components:   Glucose-Capillary 105 (*)    All other components within normal limits  URINALYSIS, ROUTINE  W REFLEX MICROSCOPIC    EKG None  Radiology CT Head Wo Contrast Result Date: 09/07/2023 CLINICAL DATA:  Provided history: Head trauma, moderate/severe. Additional history provided: Head injury following a syncopal episode. EXAM: CT HEAD WITHOUT CONTRAST TECHNIQUE: Contiguous axial images were obtained from the base of the skull through the vertex without intravenous contrast. RADIATION DOSE REDUCTION: This exam was performed according to the departmental dose-optimization program which includes automated exposure control, adjustment of the mA and/or kV according to patient size and/or use  of iterative reconstruction technique. COMPARISON:  Head CT 12/21/2018. FINDINGS: Brain: No age-advanced or lobar predominant cerebral atrophy. There is no acute intracranial hemorrhage. No demarcated cortical infarct. No extra-axial fluid collection. No evidence of an intracranial mass. No midline shift. Vascular: No hyperdense vessel. Skull: No acute fracture or aggressive osseous lesion. Visible sinuses/orbits: No mass or acute finding within the imaged orbits. Minimal mucosal thickening within the bilateral ethmoid and right sphenoid sinuses. Other: Suspected right parietal scalp laceration. Direct examination recommended. IMPRESSION: 1.  No evidence of an acute intracranial abnormality. 2. Suspected right parietal scalp laceration. Direct examination recommended. 3. Minor bilateral ethmoid and right sphenoid sinus mucosal thickening. Electronically Signed   By: Jackey Loge D.O.   On: 09/07/2023 11:34    Procedures Procedures    Medications Ordered in ED Medications  Tdap (BOOSTRIX) injection 0.5 mL (has no administration in time range)  acetaminophen (TYLENOL) tablet 650 mg (has no administration in time range)  lidocaine-EPINEPHrine-tetracaine (LET) topical gel (3 mLs Topical Given 09/07/23 1203)  lactated ringers bolus 500 mL (500 mLs Intravenous Bolus 09/07/23 1425)    ED Course/ Medical Decision  Making/ A&P Clinical Course as of 09/07/23 1633  Tue Sep 07, 2023  1630 Offered admission to the patient.  Made her aware that her hemoglobin is 9.9.  She continues to state that she does not have any blood loss. She also states that her dizziness is better after the fluid.  She feels comfortable going home.  She will take extra precautions at home.  Husband and another family met at the bedside.  Husband also states he is comfortable with patient coming home.  Discussed return precautions.  Stable for discharge. [AN]    Clinical Course User Index [AN] Derwood Kaplan, MD                                 Medical Decision Making Amount and/or Complexity of Data Reviewed Labs: ordered. Radiology: ordered.  Risk OTC drugs. Prescription drug management.   63 year old patient comes in with chief complaint of dizziness, near fainting leading to fall.  Subsequently patient injured herself and has a large scalp laceration.  She also admits to significant blood loss.  Differential diagnosis considered for this patient includes: Orthostatic hypotension Stroke Vertebral artery dissection/stenosis Dysrhythmia PE Vasovagal/neurocardiogenic syncope Aortic stenosis Valvular disorder/Cardiomyopathy Anemia  Additionally, from the trauma perspective differential diagnosis includes brain bleed, skull fracture.  Patient's tetanus will be updated. Scalp laceration was repaired.  I have reviewed patient's CT scan of the brain.  No evidence of brain bleed.  Patient's labs are overall reassuring, with a hemoglobin has dropped from 14.3 to 10.7.  Patient denies any bloody stools. I have ordered repeat CBC.  Patient's hemoglobin has further dropped to 9.9.  She states that she gets dizzy every time she sits up.  Orthostatic blood pressure ordered, it is negative.  My suspicion is that patient's dizziness is likely a combination of acute blood loss and concussion.  We will try to ambulate the  patient, if she is extremely dizzy then we will admit her.  Alternative causes for anemia, including GI loss should be considered if that happens.  Final Clinical Impression(s) / ED Diagnoses Final diagnoses:  Scalp laceration, initial encounter  Concussion without loss of consciousness, initial encounter  Blood loss    Rx / DC Orders ED Discharge Orders     None  Derwood Kaplan, MD 09/07/23 [phone]

## 2023-09-07 NOTE — ED Notes (Signed)
Wound irrigated, able to visualize laceration. LET applied to wound.

## 2023-09-10 ENCOUNTER — Emergency Department (HOSPITAL_COMMUNITY)
Admission: EM | Admit: 2023-09-10 | Discharge: 2023-09-11 | Disposition: A | Discharge status: Home / Self Care | Payer: Medicaid Other | Attending: Emergency Medicine | Admitting: Emergency Medicine

## 2023-09-10 ENCOUNTER — Encounter (HOSPITAL_COMMUNITY): Payer: Self-pay | Admitting: Emergency Medicine

## 2023-09-10 ENCOUNTER — Other Ambulatory Visit: Payer: Self-pay

## 2023-09-10 DIAGNOSIS — R110 Nausea: Secondary | ICD-10-CM | POA: Diagnosis not present

## 2023-09-10 DIAGNOSIS — R079 Chest pain, unspecified: Secondary | ICD-10-CM | POA: Diagnosis not present

## 2023-09-10 DIAGNOSIS — M4310 Spondylolisthesis, site unspecified: Secondary | ICD-10-CM | POA: Insufficient documentation

## 2023-09-10 DIAGNOSIS — G894 Chronic pain syndrome: Secondary | ICD-10-CM | POA: Diagnosis not present

## 2023-09-10 DIAGNOSIS — I2510 Atherosclerotic heart disease of native coronary artery without angina pectoris: Secondary | ICD-10-CM | POA: Diagnosis not present

## 2023-09-10 DIAGNOSIS — Z7951 Long term (current) use of inhaled steroids: Secondary | ICD-10-CM | POA: Diagnosis not present

## 2023-09-10 DIAGNOSIS — B974 Respiratory syncytial virus as the cause of diseases classified elsewhere: Secondary | ICD-10-CM | POA: Insufficient documentation

## 2023-09-10 DIAGNOSIS — R0789 Other chest pain: Secondary | ICD-10-CM | POA: Diagnosis not present

## 2023-09-10 DIAGNOSIS — Z7982 Long term (current) use of aspirin: Secondary | ICD-10-CM | POA: Insufficient documentation

## 2023-09-10 DIAGNOSIS — Z79899 Other long term (current) drug therapy: Secondary | ICD-10-CM | POA: Diagnosis not present

## 2023-09-10 DIAGNOSIS — I10 Essential (primary) hypertension: Secondary | ICD-10-CM | POA: Diagnosis not present

## 2023-09-10 DIAGNOSIS — I959 Hypotension, unspecified: Secondary | ICD-10-CM | POA: Insufficient documentation

## 2023-09-10 DIAGNOSIS — R2241 Localized swelling, mass and lump, right lower limb: Secondary | ICD-10-CM | POA: Insufficient documentation

## 2023-09-10 DIAGNOSIS — M4802 Spinal stenosis, cervical region: Secondary | ICD-10-CM | POA: Diagnosis not present

## 2023-09-10 DIAGNOSIS — S12100A Unspecified displaced fracture of second cervical vertebra, initial encounter for closed fracture: Secondary | ICD-10-CM | POA: Insufficient documentation

## 2023-09-10 DIAGNOSIS — R531 Weakness: Secondary | ICD-10-CM | POA: Diagnosis not present

## 2023-09-10 DIAGNOSIS — E119 Type 2 diabetes mellitus without complications: Secondary | ICD-10-CM | POA: Diagnosis not present

## 2023-09-10 DIAGNOSIS — G629 Polyneuropathy, unspecified: Secondary | ICD-10-CM | POA: Diagnosis not present

## 2023-09-10 DIAGNOSIS — B370 Candidal stomatitis: Secondary | ICD-10-CM | POA: Diagnosis not present

## 2023-09-10 DIAGNOSIS — J45909 Unspecified asthma, uncomplicated: Secondary | ICD-10-CM | POA: Insufficient documentation

## 2023-09-10 DIAGNOSIS — E860 Dehydration: Secondary | ICD-10-CM | POA: Diagnosis not present

## 2023-09-10 DIAGNOSIS — J449 Chronic obstructive pulmonary disease, unspecified: Secondary | ICD-10-CM | POA: Diagnosis not present

## 2023-09-10 DIAGNOSIS — M7989 Other specified soft tissue disorders: Secondary | ICD-10-CM | POA: Diagnosis not present

## 2023-09-10 DIAGNOSIS — R0602 Shortness of breath: Secondary | ICD-10-CM | POA: Insufficient documentation

## 2023-09-10 DIAGNOSIS — F17210 Nicotine dependence, cigarettes, uncomplicated: Secondary | ICD-10-CM | POA: Diagnosis not present

## 2023-09-10 DIAGNOSIS — I700 Atherosclerosis of aorta: Secondary | ICD-10-CM | POA: Diagnosis not present

## 2023-09-10 DIAGNOSIS — B338 Other specified viral diseases: Secondary | ICD-10-CM

## 2023-09-10 DIAGNOSIS — G4700 Insomnia, unspecified: Secondary | ICD-10-CM | POA: Diagnosis not present

## 2023-09-10 LAB — HEPATIC FUNCTION PANEL
ALT: 20 U/L (ref 0–44)
AST: 20 U/L (ref 15–41)
Albumin: 2.8 g/dL — ABNORMAL LOW (ref 3.5–5.0)
Alkaline Phosphatase: 68 U/L (ref 38–126)
Bilirubin, Direct: <0.1 mg/dL (ref 0.0–0.2)
Total Bilirubin: 0.5 mg/dL (ref 0.0–1.2)
Total Protein: 5.7 g/dL — ABNORMAL LOW (ref 6.5–8.1)

## 2023-09-10 LAB — POC OCCULT BLOOD, ED: Fecal Occult Blood: NEGATIVE

## 2023-09-10 LAB — CBC
HCT: 29.7 % — ABNORMAL LOW (ref 36.0–46.0)
Hemoglobin: 9.2 g/dL — ABNORMAL LOW (ref 12.0–15.0)
MCH: 30.1 pg (ref 26.0–34.0)
MCHC: 31 g/dL (ref 30.0–36.0)
MCV: 97.1 fL (ref 80.0–100.0)
Platelets: 292 10*3/uL (ref 150–400)
RBC: 3.06 MIL/uL — ABNORMAL LOW (ref 3.87–5.11)
RDW: 17.2 % — ABNORMAL HIGH (ref 11.5–15.5)
WBC: 10.1 10*3/uL (ref 4.0–10.5)
nRBC: 0 % (ref 0.0–0.2)

## 2023-09-10 LAB — BASIC METABOLIC PANEL
Anion gap: 9 (ref 5–15)
BUN: 12 mg/dL (ref 8–23)
CO2: 32 mmol/L (ref 22–32)
Calcium: 8.6 mg/dL — ABNORMAL LOW (ref 8.9–10.3)
Chloride: 98 mmol/L (ref 98–111)
Creatinine, Ser: 0.63 mg/dL (ref 0.44–1.00)
GFR, Estimated: >60 mL/min (ref >60)
Glucose, Bld: 113 mg/dL — ABNORMAL HIGH (ref 70–99)
Potassium: 4.3 mmol/L (ref 3.5–5.1)
Sodium: 139 mmol/L (ref 135–145)

## 2023-09-10 LAB — MAGNESIUM: Magnesium: 1.7 mg/dL (ref 1.7–2.4)

## 2023-09-10 LAB — D-DIMER, QUANTITATIVE: D-Dimer, Quant: 0.73 ug{FEU}/mL — ABNORMAL HIGH (ref 0.00–0.50)

## 2023-09-10 LAB — TROPONIN I (HIGH SENSITIVITY)
Troponin I (High Sensitivity): 8 ng/L (ref <18)
Troponin I (High Sensitivity): 7 ng/L (ref <18)

## 2023-09-10 LAB — BRAIN NATRIURETIC PEPTIDE: B Natriuretic Peptide: 67 pg/mL (ref 0.0–100.0)

## 2023-09-10 MED ORDER — DIPHENHYDRAMINE HCL 25 MG PO CAPS: 50.0000 mg | ORAL_CAPSULE | Freq: Once | ORAL | Status: AC

## 2023-09-10 MED ORDER — ONDANSETRON HCL 4 MG/2ML IJ SOLN
4.0000 mg | Freq: Once | INTRAMUSCULAR | Status: AC
  Administered 2023-09-10: 4 mg via INTRAVENOUS
  Filled 2023-09-10: qty 2

## 2023-09-10 MED ORDER — LACTATED RINGERS IV BOLUS
1000.0000 mL | Freq: Once | INTRAVENOUS | Status: AC
  Administered 2023-09-10: 1000 mL via INTRAVENOUS

## 2023-09-10 MED ORDER — DIPHENHYDRAMINE HCL 50 MG/ML IJ SOLN
50.0000 mg | Freq: Once | INTRAMUSCULAR | Status: AC
  Administered 2023-09-11: 50 mg via INTRAVENOUS
  Filled 2023-09-10: qty 1

## 2023-09-10 MED ORDER — METHYLPREDNISOLONE SODIUM SUCC 40 MG IJ SOLR
40.0000 mg | Freq: Once | INTRAMUSCULAR | Status: AC
  Administered 2023-09-10: 40 mg via INTRAVENOUS
  Filled 2023-09-10: qty 1

## 2023-09-10 NOTE — ED Triage Notes (Signed)
Pt seen on 2/11 for fall and needing staples in head. States she is feeling weak and had hypotension today at [facility]. Pt on 3L [place] chronic.

## 2023-09-10 NOTE — ED Provider Notes (Signed)
 [place] EMERGENCY DEPARTMENT AT [facility] Provider Note   CSN: [id number] Arrival date & time: 09/10/23  1746     History  Chief Complaint  Patient presents with   Weakness    Margaret Vaughn is a 63 y.o. female with chronic hypoxic respiratory failure due to COPD on 3 L nasal cannula at baseline, HTN, HLD, OSA, syringomyelia, fibromyalgia, spondylolisthesis, dysthymic disorder, GAD/PTSD  who presents with generalized weakness, hypotension. Was seen by Dr. Malen Gauze today and found to be hypotensive, sent to the ED. She feels "bad," and feels nauseated/lightheaded with standing like she is going to pass out. Endorses dark stool. Not coughing more or more SOB than normal. Endorses intermittent mild chest pain as well. No urinary sxs, BRBPR. Has noted RLE edema worse than the left for approx 1 week as well with no pain. She states her breathing is at her baseline right now. Was recently diagnosed w/ RSV on 08/12/23 as well. Also endorses nausea/vomiting, decreased PO intake.  Pt seen on 2/11 for fall/syncopal episode, nasal congestion/dizziness and needing staples in head. States she is feeling weak and had hypotension today at [facility]. Pt on 3L [place] chronically d/t COPD.  Per chart review was seen in the ED on 08/12/2023 for worsening shortness of breath/hypoxia and was treated for a COPD exacerbation.  Was subsequently seen in the ED on 09/07/2023 after syncope and fall from standing with a scalp laceration treated with staples.  Complained of dizziness that time which improved with fluids. Currently she denies SOB.     Past Medical History:  Diagnosis Date   Acid reflux    Allergies    Anemia    Asthma    Bronchitis    Cervical stenosis of spine    Chronic pain syndrome    Complication of anesthesia    difficulty waking up from anesthesia   COPD (chronic obstructive pulmonary disease) (HCC)    DDD (degenerative disc disease), lumbar    Depression with anxiety     Diabetes mellitus without complication (HCC)    Dyspnea    Dysrhythmia    PSVT   Fatigue    H/O blood clots    H/O echocardiogram 1989   Per pt, done @ [facility]- Showed mitral valve prolapse   Headache(784.0)    Heart murmur    Hemorrhoids, external    History of hiatal hernia    History of kidney stones    History of PSVT (paroxysmal supraventricular tachycardia)    HTN (hypertension)    Hyperlipidemia    Hypoxemia    Insomnia    Memory loss    Myalgia and myositis    Narcolepsy    On home O2    2-4 L @ HS   OSA (obstructive sleep apnea)    Pneumonia    Sleeping difficulty    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HCC)    Thoracalgia    Tobacco abuse    Tremor    Ulcer disease        Home Medications Prior to Admission medications   Medication Sig Start Date End Date Taking? Authorizing Provider  albuterol (PROVENTIL) (2.5 MG/3ML) 0.083% nebulizer solution Take 3 mLs (2.5 mg total) by nebulization every 6 (six) hours as needed for wheezing or shortness of breath. 08/12/23   [name], PA-C  albuterol (VENTOLIN HFA) 108 (90 Base) MCG/ACT inhaler INHALE 2 PUFFS BY MOUTH EVERY 6 HOURS AS NEEDED FOR WHEEZE OR SHORTNESS OF BREATH 04/16/23   Dettinger,  Elige Radon, MD  AMITIZA 24 MCG capsule TAKE 1 CAPSULE (24 MCG TOTAL) BY MOUTH 2 (TWO) TIMES DAILY WITH A MEAL. 09/07/22   [name], NP  aspirin EC 81 MG tablet Take 81 mg by mouth daily. Swallow whole.    [provider]  atorvastatin (LIPITOR) 40 MG tablet Take 1 tablet (40 mg total) by mouth daily. 04/16/23   Dettinger, Elige Radon, MD  baclofen (LIORESAL) 10 MG tablet Take 10 mg by mouth 3 (three) times daily as needed for muscle spasms. 06/12/22   [provider]  BELBUCA 300 MCG FILM Place 300 mcg inside cheek 2 (two) times daily as needed (pain). 07/09/23   [provider]  benzonatate (TESSALON) 200 MG capsule Take 1 capsule (200 mg total) by mouth 3 (three) times daily as needed for cough. 07/16/23    Dettinger, Elige Radon, MD  budesonide-formoterol (SYMBICORT) 160-4.5 MCG/ACT inhaler Inhale 2 puffs into the lungs 2 (two) times daily. 08/18/23   Hunsucker, Lesia Sago, MD  DULoxetine (CYMBALTA) 60 MG capsule TAKE 1 CAPSULE (60 MG TOTAL) BY MOUTH DAILY. 08/20/23   Dettinger, Elige Radon, MD  enalapril (VASOTEC) 5 MG tablet Take 1 tablet (5 mg total) by mouth daily. 04/16/23   Dettinger, Elige Radon, MD  Multiple Vitamins-Minerals (HAIR SKIN NAILS PO) Take 3 tablets by mouth daily.    [provider]  Multiple Vitamins-Minerals (MULTIVITAMIN WITH MINERALS) tablet Take 1 tablet by mouth daily. Patient not taking: Reported on 09/06/2023    [provider]  naloxone [facility]) nasal spray 4 mg/0.1 mL Place 1 spray into the nose once. 08/06/23   [provider]  nicotine (NICODERM CQ - DOSED IN MG/24 HOURS) 21 mg/24hr patch Place 21 mg onto the skin daily as needed (smoking cessation).    [provider]  nystatin (MYCOSTATIN) 100000 UNIT/ML suspension Take 5 mLs by mouth 4 (four) times daily as needed (thrush). 07/09/23   [provider]  ondansetron (ZOFRAN) 4 MG tablet Take 4 mg by mouth 3 (three) times daily as needed for nausea or vomiting. 07/09/23   [provider]  oxyCODONE (ROXICODONE) 15 MG immediate release tablet Take 15 mg by mouth 4 (four) times daily as needed for pain. 08/08/23   [provider]  pregabalin (LYRICA) 150 MG capsule Take 1 capsule (150 mg total) by mouth 3 (three) times daily. 06/26/20   Dettinger, Elige Radon, MD  sitaGLIPtin-metformin (JANUMET) 50-1000 MG tablet TAKE 1 TABLET BY MOUTH TWICE A DAY WITH FOOD 04/16/23   Dettinger, Elige Radon, MD  Tiotropium Bromide Monohydrate (SPIRIVA RESPIMAT) 2.5 MCG/ACT AERS Inhale 2 puffs into the lungs daily. 08/18/23   Hunsucker, Lesia Sago, MD  zolpidem (AMBIEN CR) 6.25 MG CR tablet Take 6.25 mg by mouth at bedtime. Patient not taking: Reported on 09/06/2023 07/09/23   [provider]       Allergies    Avelox [moxifloxacin], Iohexol, and Shellfish allergy    Review of Systems   Review of Systems A 10 point review of systems was performed and is negative unless otherwise reported in HPI.  Physical Exam Updated Vital Signs BP 126/72   Pulse 94   Temp 99.2 F (37.3 C) (Oral)   Resp 16   Ht 5\' 4"  (1.626 m)   Wt 63 kg   SpO2 100%   BMI 23.84 kg/m  Physical Exam General: Weak-appearing elderly female, lying in bed.  HEENT: Well-healing scalp laceration on the crown of the scalp with staples in  place, no surrounding erythema, swelling, purulent drainage, or significant tenderness to palpation.  PERRLA, EOMI, no nystagmus, Sclera anicteric, MMM, trachea midline.  Cardiology: RRR, no murmurs/rubs/gallops. BL radial and DP pulses equal bilaterally.  Resp: Normal respiratory rate and effort. Mild inspiratory/expiratory wheezes bilaterally Abd: Soft, non-tender, non-distended. No rebound tenderness or guarding.  GU: Deferred. MSK: Nonpitting edema of R lower leg, asymmetric compared to left, with no TTP. No signs of trauma. Extremities without deformity or TTP. No cyanosis or clubbing. Skin: warm, dry. No rashes or lesions. Back: No CVA tenderness Neuro: A&Ox4, CNs II-XII grossly intact. MAEs. Sensation grossly intact.  Psych: Normal mood and affect.   ED Results / Procedures / Treatments   Labs (all labs ordered are listed, but only abnormal results are displayed) Labs Reviewed  BASIC METABOLIC PANEL - Abnormal; Notable for the following components:      Result Value   Glucose, Bld 113 (*)    Calcium 8.6 (*)    All other components within normal limits  CBC - Abnormal; Notable for the following components:   RBC 3.06 (*)    Hemoglobin 9.2 (*)    HCT 29.7 (*)    RDW 17.2 (*)    All other components within normal limits  HEPATIC FUNCTION PANEL - Abnormal; Notable for the following components:   Total Protein 5.7 (*)    Albumin 2.8 (*)    All other components  within normal limits  D-DIMER, QUANTITATIVE - Abnormal; Notable for the following components:   D-Dimer, Quant 0.73 (*)    All other components within normal limits  MAGNESIUM  BRAIN NATRIURETIC PEPTIDE  POC OCCULT BLOOD, ED  TROPONIN I (HIGH SENSITIVITY)    EKG EKG Interpretation Date/Time:  Friday September 10 2023 18:19:44 EST Ventricular Rate:  101 PR Interval:  202 QRS Duration:  128 QT Interval:  364 QTC Calculation: 471 R Axis:   -79  Text Interpretation: Sinus tachycardia Right bundle branch block Left anterior fascicular block Bifascicular block Similar to prior EKG Confirmed by Vivi Barrack [id number]) on 09/10/2023 10:41:38 PM  Radiology CT PE: Pending  Procedures Procedures    Medications Ordered in ED Medications  diphenhydrAMINE (BENADRYL) capsule 50 mg (has no administration in time range)    Or  diphenhydrAMINE (BENADRYL) injection 50 mg (has no administration in time range)  lactated ringers bolus 1,000 mL (has no administration in time range)  ondansetron (ZOFRAN) injection 4 mg (4 mg Intravenous Given 09/10/23 2214)  methylPREDNISolone sodium succinate (SOLU-MEDROL) 40 mg/mL injection 40 mg (40 mg Intravenous Given 09/10/23 2227)    ED Course/ Medical Decision Making/ A&P                          Medical Decision Making Amount and/or Complexity of Data Reviewed Labs: ordered. Decision-making details documented in ED Course. Radiology: ordered.  Risk Prescription drug management.    This patient presents to the ED for concern of dizziness/hypotension, this involves an extensive number of treatment options, and is a complaint that carries with it a high risk of complications and morbidity.  I considered the following differential and admission for this acute, potentially life threatening condition.   MDM:    Patient's primary complaint seems to be dizziness/presyncope. She also does endorse generalized weakness and chest pain. DDX for pre-syncope  includes but is not limited to:  Consider anemia and electrolyte abnormalities as possible etiologies. Consider arrhythmias and ACS, EKG without ischemia and similar to prior,  will eval troponin. Consider hemorrhage vs CVA, although neuro intact now, no nystagmus, and no associated other symptoms. She does have COPD and is not hypoxic on her home O2 today, doesn't report SOB/cough worse than normal, but does report N/V and recent diagnosis of RSV, consider viral syndrome. She does have recent hospitalization, and her R leg is found to be swollen more than normal x1 week, consider PE causing sxs. Will get D-dimer also to eval for DVT, but may need o/p [place]. Patient does have contrast allergy so will require emergency prep, will initiate now. Also consider aortic dissection, will evaluate with CT as well. Consider also orthostatic dizziness, dehydration, due to N/V, decrease PO intake which could cause hypovolemia/hypotension/dizziness. She is afebrile, has no abdominal TTP, lower c/f SBO or intraabdominal infection such as diverticulitis/appendicitis. No urinary sxs to indicate UTI.    Clinical Course as of 09/10/23 2244  Fri Sep 10, 2023  2115 Hemoglobin(!): 9.2 Down from 9.9 three days ago [HN]  2115 Basic metabolic panel(!) Unremarkable in the context of this patient's presentation  [HN]  2241 Orthostatic vitals signs neg [HN]    Clinical Course User Index [HN] Loetta Rough, MD    Labs: I Ordered, and personally interpreted labs.  The pertinent results include:  those listed above  Imaging Studies ordered: I ordered imaging studies including CT PE I independently visualized and interpreted imaging. I agree with the radiologist interpretation  Additional history obtained from chart review, husband at bedside.    Cardiac Monitoring: The patient was maintained on a cardiac monitor.  I personally viewed and interpreted the cardiac monitored which showed an underlying rhythm of: sinus  tachycardia  Reevaluation: After the interventions noted above, I reevaluated the patient and found that they have :stayed the same  Social Determinants of Health: Lives independently  Disposition:  Patient is signed out to the oncoming ED physician Dr. Blinda Leatherwood who is made aware of her history, presentation, exam, workup, and plan.    Co morbidities that complicate the patient evaluation  Past Medical History:  Diagnosis Date   Acid reflux    Allergies    Anemia    Asthma    Bronchitis    Cervical stenosis of spine    Chronic pain syndrome    Complication of anesthesia    difficulty waking up from anesthesia   COPD (chronic obstructive pulmonary disease) (HCC)    DDD (degenerative disc disease), lumbar    Depression with anxiety    Diabetes mellitus without complication (HCC)    Dyspnea    Dysrhythmia    PSVT   Fatigue    H/O blood clots    H/O echocardiogram 1989   Per pt, done @ [facility]- Showed mitral valve prolapse   Headache(784.0)    Heart murmur    Hemorrhoids, external    History of hiatal hernia    History of kidney stones    History of PSVT (paroxysmal supraventricular tachycardia)    HTN (hypertension)    Hyperlipidemia    Hypoxemia    Insomnia    Memory loss    Myalgia and myositis    Narcolepsy    On home O2    2-4 L @ HS   OSA (obstructive sleep apnea)    Pneumonia    Sleeping difficulty    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HCC)    Thoracalgia    Tobacco abuse    Tremor    Ulcer disease      Medicines Meds ordered  this encounter  Medications   ondansetron (ZOFRAN) injection 4 mg   methylPREDNISolone sodium succinate (SOLU-MEDROL) 40 mg/mL injection 40 mg    IV methylprednisolone will be converted to either a q12h or q24h frequency with the same total daily dose (TDD).  Ordered Dose: 1 to 125 mg TDD; convert to: TDD q24h.  Ordered Dose: 126 to 250 mg TDD; convert to: TDD div q12h.  Ordered Dose: >250 mg TDD; DAW.   OR Linked Order Group     diphenhydrAMINE (BENADRYL) capsule 50 mg    diphenhydrAMINE (BENADRYL) injection 50 mg   lactated ringers bolus 1,000 mL    I have reviewed the patients home medicines and have made adjustments as needed  Problem List / ED Course: Problem List Items Addressed This Visit   None Visit Diagnoses       Weakness    -  Primary     Dehydration         RSV infection         Leg swelling                       This note was created using dictation software, which may contain spelling or grammatical errors.    Loetta Rough, MD 09/18/23 [phone]

## 2023-09-11 ENCOUNTER — Emergency Department (HOSPITAL_COMMUNITY): Payer: Medicaid Other

## 2023-09-11 DIAGNOSIS — I700 Atherosclerosis of aorta: Secondary | ICD-10-CM | POA: Diagnosis not present

## 2023-09-11 DIAGNOSIS — J449 Chronic obstructive pulmonary disease, unspecified: Secondary | ICD-10-CM | POA: Diagnosis not present

## 2023-09-11 DIAGNOSIS — I2510 Atherosclerotic heart disease of native coronary artery without angina pectoris: Secondary | ICD-10-CM | POA: Diagnosis not present

## 2023-09-11 DIAGNOSIS — I10 Essential (primary) hypertension: Secondary | ICD-10-CM | POA: Diagnosis not present

## 2023-09-11 LAB — RESP PANEL BY RT-PCR (RSV, FLU A&B, COVID)  RVPGX2
Influenza A by PCR: NEGATIVE
Influenza B by PCR: NEGATIVE
Resp Syncytial Virus by PCR: POSITIVE — AB
SARS Coronavirus 2 by RT PCR: NEGATIVE

## 2023-09-11 MED ORDER — ONDANSETRON 4 MG PO TBDP: ORAL_TABLET | ORAL | 0 refills | Status: AC

## 2023-09-11 MED ORDER — IOHEXOL 350 MG/ML SOLN
75.0000 mL | Freq: Once | INTRAVENOUS | Status: AC | PRN
  Administered 2023-09-11: 75 mL via INTRAVENOUS

## 2023-09-11 NOTE — ED Provider Notes (Signed)
 Patient signed out to me by Dr. Jearld Fenton.  Patient seen for generalized weakness.  She does indicate that she has had some nausea and vomiting.  This tends to inhibit her from drinking and she gets dehydrated easily.  Patient's workup has been thorough and entirely negative other than RSV.  Patient tested positive for RSV 1 month ago as well.  I would expect the test to have been negative by this point, this may be repeat infection.  She reports that this would be the third time this year.  Patient not requiring any increased oxygen, in no respiratory distress.  Patient does have a GI doctor, recommend follow-up with them to further investigate her frequent nausea and vomiting.  Will give prescription for Zofran to be used symptomatically.  She does not require hospitalization at this time.  Patient with bilateral lower extremity edema that seems somewhat chronic, but reports that the right side has been more swollen for 1 week.  D-dimer was slightly elevated.  No evidence of PE on CT scan.  Outpatient venous duplex to be performed.   Gilda Crease, MD 09/11/23 [phone]

## 2023-09-11 NOTE — Discharge Instructions (Signed)
 You will need to come back to have an ultrasound of your leg to make sure there is not a blood clot in the leg.  We did not see any blood clots anywhere else in your body during your workup today.

## 2023-09-14 DIAGNOSIS — Z79899 Other long term (current) drug therapy: Secondary | ICD-10-CM | POA: Diagnosis not present

## 2023-09-16 ENCOUNTER — Ambulatory Visit: Payer: Self-pay | Admitting: Family Medicine

## 2023-09-16 NOTE — Telephone Encounter (Signed)
 Chief Complaint: Difficulty breathing Symptoms: SOB, tachycardia, leg swelling, wheezing, cough, syncope Frequency: Constant Pertinent Negatives: Patient denies chest pain, redness of legs, pain, headache, vision changes, numbness/weakness of legs Disposition: [] ED /[] Urgent Care (no appt availability in office) / [x] Appointment(In office/virtual)/ []  [place]/ [] Home Care/ [] Refused Recommended Disposition /[] [facility]/ []  Follow-up with PCP Additional Notes: Patient called with complaints of worsening COPD, tachycardia, leg swelling, syncope, and SOB. Patient states that she has had a long chronic history of COPD that has been getting worse for the past weeks. Patient states she normally wears oxygen at 3L but has had to turn it up to 5L in the last couple weeks in order to feel comfortable. Patient states she feels like she is suffocating if she takes her oxygen off and gave past 02 sat at rm air at 73%. Patient's 02 is usually around 88% and patient states that she also has new normal HR that has increased to around 120bpm, and sometimes feels like her heart is fluttering with her watch measuring HR around 160-180bpm. Patient states that she sometimes feels lightheaded during these times, has had past episodes of syncope which recently results in stiches of the head. Patient states she has pitting edema swelling of both legs that has worsened since December. Patient states she is normally slightly SOB at rest and denies current chest pain or severe difficulty breathing, HR is currently 120, and she is wearing oxygen per usual. Patient manages COPD with oxygen and several inhalers, and has history of micro valve prolapse Patient advised by this RN to be seen within 3 days per protocol to which patient was agreeable. Patient advised by this RN to call back or call [phone] with worsening symptoms prior to appointment to which patient was agreeable.  Copied from CRM [id number]. Topic:  Appointments - Appointment Scheduling >> Sep 16, 2023  9:59 AM Gery Pray wrote: Patient/patient representative is calling to schedule an appointment. Refer to attachments for appointment information. Patient has staples that need to be taken out. In the midst of scheduling the appointment patient stated that she thinks she is going into heart failure. Patient feet and lower legs has been swollen for the past week. Patient is experiencing fainting spells. In addition, patient's breathing due to her COPD is getting worse as she has had to turn her oxygen up. Reason for Disposition  [1] MODERATE longstanding difficulty breathing (e.g., speaks in phrases, SOB even at rest, pulse 100-120) AND [2] SAME as normal  Answer Assessment - Initial Assessment Questions 1. RESPIRATORY STATUS: "Describe your breathing?" (e.g., wheezing, shortness of breath, unable to speak, severe coughing)      Wheezing, SOB, coughing. 2. ONSET: "When did this breathing problem begin?"      "It's been getting worse two weeks ago right before I busted my head open." 3. PATTERN "Does the difficult breathing come and go, or has it been constant since it started?"      Constant 4. SEVERITY: "How bad is your breathing?" (e.g., mild, moderate, severe)    - MILD: No SOB at rest, mild SOB with walking, speaks normally in sentences, can lie down, no retractions, pulse < 100.    - MODERATE: SOB at rest, SOB with minimal exertion and prefers to sit, cannot lie down flat, speaks in phrases, mild retractions, audible wheezing, pulse 100-120.    - SEVERE: Very SOB at rest, speaks in single words, struggling to breathe, sitting hunched forward, retractions, pulse > 120  Moderate, pulse 120 "that's the new normal, it's been like that for several months." 5. RECURRENT SYMPTOM: "Have you had difficulty breathing before?" If Yes, ask: "When was the last time?" and "What happened that time?"      Yes "I have chronic COPD, it's getting worse." 6.  CARDIAC HISTORY: "Do you have any history of heart disease?" (e.g., heart attack, angina, bypass surgery, angioplasty)      Micro valve prolapse. "My heart goes crazy. Sometimes my watching will be like 160-180 bpm. Sometimes, " 7. LUNG HISTORY: "Do you have any history of lung disease?"  (e.g., pulmonary embolus, asthma, emphysema)     COPD - O2 L5, use fast acting inhaler. 8. CAUSE: "What do you think is causing the breathing problem?"      "COPD has gotten worse." 9. OTHER SYMPTOMS: "Do you have any other symptoms? (e.g., dizziness, runny nose, cough, chest pain, fever)     Coughing, "both of my feet are swollen and past my knees now, it's been creeping up and it's been like for a couple. "If I take my oxygen." 10. O2 SATURATION MONITOR:  "Do you use an oxygen saturation monitor (pulse oximeter) at home?" If Yes, ask: "What is your reading (oxygen level) today?" "What is your usual oxygen saturation reading?" (e.g., 95%)       88% 12. TRAVEL: "Have you traveled out of the country in the last month?" (e.g., travel history, exposures)       Denies  Protocols used: Breathing Difficulty-A-AH

## 2023-09-17 ENCOUNTER — Ambulatory Visit: Payer: Medicaid Other | Admitting: Family Medicine

## 2023-09-17 ENCOUNTER — Encounter: Payer: Self-pay | Admitting: Family Medicine

## 2023-09-17 VITALS — BP 126/81 | HR 116 | Ht 64.0 in | Wt 144.0 lb

## 2023-09-17 DIAGNOSIS — S0101XD Laceration without foreign body of scalp, subsequent encounter: Secondary | ICD-10-CM

## 2023-09-17 DIAGNOSIS — J441 Chronic obstructive pulmonary disease with (acute) exacerbation: Secondary | ICD-10-CM | POA: Diagnosis not present

## 2023-09-17 DIAGNOSIS — B338 Other specified viral diseases: Secondary | ICD-10-CM | POA: Diagnosis not present

## 2023-09-17 DIAGNOSIS — E860 Dehydration: Secondary | ICD-10-CM | POA: Diagnosis not present

## 2023-09-17 MED ORDER — METHYLPREDNISOLONE ACETATE 80 MG/ML IJ SUSP
80.0000 mg | Freq: Once | INTRAMUSCULAR | Status: AC
  Administered 2023-09-17: 80 mg via INTRA_ARTICULAR

## 2023-09-17 MED ORDER — PREDNISONE 20 MG PO TABS: ORAL_TABLET | ORAL | 0 refills | Status: DC

## 2023-09-17 NOTE — Progress Notes (Signed)
 BP 126/81   Pulse (!) 116   Ht 5\' 4"  (1.626 m)   Wt 144 lb (65.3 kg)   SpO2 96%   BMI 24.72 kg/m    Subjective:   Patient ID: Margaret Vaughn, female    DOB: [birth year], 63 y.o.   MRN: [id number]  HPI: Margaret Vaughn is a 63 y.o. female presenting on 09/17/2023 for Follow-up (Fall, RSV), Shoulder Pain (right), Edema (BLE), and Suture / Staple Removal (Posterior scalp)   HPI Scalp laceration and fall. Patient had a fall on 09/07/23 and scalp laceration and does not know why she passed out.  She was on toilet and bent over to pick something up and got dizzy and fell forward and hit head on 2x4 and had staples. She lost a good amount of blood.  She got short of breath and tested positive for RSV  in ED on 09/10/23. Her oxygen was 75%.  Since then she still having some coughing and congestion and a lot of wheezing.  She is still using her oxygen.  She says her oxygen saturation got down as low as 76% but now it is maintaining in the high 80s at home.  She thinks that she may have fallen because she got a little short of breath and fell forward.  She had staples placed in her scalp laceration on 2/11.  She was given medicines in the ER but not sent home with anything from the RSV on 2/14.  She has developed some edema in bilateral lower extremities that is worsened and she does admit she has been a lot more sedentary and legs hanging down because she cannot lay back when she gets short of breath like this.  Relevant past medical, surgical, family and social history reviewed and updated as indicated. Interim medical history since our last visit reviewed. Allergies and medications reviewed and updated.  Review of Systems  Constitutional:  Negative for chills and fever.  HENT:  Positive for congestion. Negative for ear discharge and ear pain.   Eyes:  Negative for visual disturbance.  Respiratory:  Positive for cough, shortness of breath and wheezing. Negative for chest tightness.    Cardiovascular:  Positive for leg swelling. Negative for chest pain.  Genitourinary:  Negative for difficulty urinating and dysuria.  Musculoskeletal:  Negative for back pain and gait problem.  Skin:  Positive for wound. Negative for rash.  Neurological:  Negative for light-headedness and headaches.  Psychiatric/Behavioral:  Negative for agitation and behavioral problems.   All other systems reviewed and are negative.   Per HPI unless specifically indicated above   Allergies as of 09/17/2023       Reactions   Avelox [moxifloxacin] Anaphylaxis   Iohexol Anaphylaxis   Pt given 300 for CT Angio was given 50 mg benadryl prior to scan had no problems. Anaphylaxis?   Shellfish Allergy Hives        Medication List        Accurate as of September 17, 2023  4:07 PM. If you have any questions, ask your nurse or doctor.          albuterol 108 (90 Base) MCG/ACT inhaler Commonly known as: Ventolin HFA INHALE 2 PUFFS BY MOUTH EVERY 6 HOURS AS NEEDED FOR WHEEZE OR SHORTNESS OF BREATH   albuterol (2.5 MG/3ML) 0.083% nebulizer solution Commonly known as: PROVENTIL Take 3 mLs (2.5 mg total) by nebulization every 6 (six) hours as needed for wheezing or shortness of breath.   Amitiza 24  MCG capsule Generic drug: lubiprostone TAKE 1 CAPSULE (24 MCG TOTAL) BY MOUTH 2 (TWO) TIMES DAILY WITH A MEAL.   aspirin EC 81 MG tablet Take 81 mg by mouth daily. Swallow whole.   atorvastatin 40 MG tablet Commonly known as: LIPITOR Take 1 tablet (40 mg total) by mouth daily.   baclofen 10 MG tablet Commonly known as: LIORESAL Take 10 mg by mouth 3 (three) times daily as needed for muscle spasms.   Belbuca 300 MCG Film Generic drug: Buprenorphine HCl Place 300 mcg inside cheek 2 (two) times daily as needed (pain).   benzonatate 200 MG capsule Commonly known as: TESSALON Take 1 capsule (200 mg total) by mouth 3 (three) times daily as needed for cough.   budesonide-formoterol 160-4.5  MCG/ACT inhaler Commonly known as: Symbicort Inhale 2 puffs into the lungs 2 (two) times daily.   DULoxetine 60 MG capsule Commonly known as: CYMBALTA TAKE 1 CAPSULE (60 MG TOTAL) BY MOUTH DAILY.   enalapril 5 MG tablet Commonly known as: VASOTEC Take 1 tablet (5 mg total) by mouth daily.   HAIR SKIN NAILS PO Take 3 tablets by mouth daily.   multivitamin with minerals tablet Take 1 tablet by mouth daily.   Janumet 50-1000 MG tablet Generic drug: sitaGLIPtin-metformin TAKE 1 TABLET BY MOUTH TWICE A DAY WITH FOOD   naloxone 4 MG/0.1ML Liqd nasal spray kit Commonly known as: NARCAN Place 1 spray into the nose once.   nicotine 21 mg/24hr patch Commonly known as: NICODERM CQ - dosed in mg/24 hours Place 21 mg onto the skin daily as needed (smoking cessation).   nystatin 100000 UNIT/ML suspension Commonly known as: MYCOSTATIN Take 5 mLs by mouth 4 (four) times daily as needed (thrush).   ondansetron 4 MG disintegrating tablet Commonly known as: ZOFRAN-ODT 4mg  ODT q4 hours prn nausea/vomit   ondansetron 4 MG tablet Commonly known as: ZOFRAN Take 4 mg by mouth 3 (three) times daily as needed for nausea or vomiting.   oxyCODONE 15 MG immediate release tablet Commonly known as: ROXICODONE Take 15 mg by mouth 4 (four) times daily as needed for pain.   predniSONE 20 MG tablet Commonly known as: DELTASONE 2 po at same time daily for 5 days Started by: Elige Radon Rakayla Ricklefs   pregabalin 150 MG capsule Commonly known as: LYRICA Take 1 capsule (150 mg total) by mouth 3 (three) times daily.   Spiriva Respimat 2.5 MCG/ACT Aers Generic drug: Tiotropium Bromide Monohydrate Inhale 2 puffs into the lungs daily.   zolpidem 6.25 MG CR tablet Commonly known as: AMBIEN CR Take 6.25 mg by mouth at bedtime.         Objective:   BP 126/81   Pulse (!) 116   Ht 5\' 4"  (1.626 m)   Wt 144 lb (65.3 kg)   SpO2 96%   BMI 24.72 kg/m   Wt Readings from Last 3 Encounters:  09/17/23  144 lb (65.3 kg)  09/10/23 138 lb 14.2 oz (63 kg)  09/07/23 139 lb 12.8 oz (63.4 kg)    Physical Exam Vitals and nursing note reviewed.  Constitutional:      General: She is not in acute distress.    Appearance: She is well-developed. She is not diaphoretic.  Eyes:     Conjunctiva/sclera: Conjunctivae normal.  Cardiovascular:     Rate and Rhythm: Normal rate and regular rhythm.     Heart sounds: Normal heart sounds. No murmur heard. Pulmonary:     Effort: Pulmonary effort is normal.  No respiratory distress.     Breath sounds: Wheezing, rhonchi and rales present.  Musculoskeletal:        General: No tenderness. Normal range of motion.  Skin:    General: Skin is warm and dry.     Findings: No rash.  Neurological:     Mental Status: She is alert and oriented to person, place, and time.     Coordination: Coordination normal.  Psychiatric:        Behavior: Behavior normal.       Assessment & Plan:   Problem List Items Addressed This Visit   None Visit Diagnoses       Laceration of scalp, subsequent encounter    -  Primary     Dehydration         RSV infection       Relevant Medications   methylPREDNISolone acetate (DEPO-MEDROL) injection 80 mg (Start on 09/17/2023  4:15 PM)   predniSONE (DELTASONE) 20 MG tablet   Other Relevant Orders   CBC with Differential/Platelet   CMP14+EGFR     COPD exacerbation (HCC)       Relevant Medications   methylPREDNISolone acetate (DEPO-MEDROL) injection 80 mg (Start on 09/17/2023  4:15 PM)   predniSONE (DELTASONE) 20 MG tablet   Other Relevant Orders   CBC with Differential/Platelet   CMP14+EGFR       Will give Depo-Medrol 80 mg and prednisone help with her breathing.  That peripheral edema is likely because she is dependent and encouraged compression stockings and elevation is much as she can.  She has trouble with that because of her respiratory status.  She is currently on oxygen as well and will continue with that.  Will check  blood counts including CBC and chemistry panel today on the way out.  Urinalysis  Removed 9 staples and the wound looks to be healed well Follow up plan: Return if symptoms worsen or fail to improve.  Counseling provided for all of the vaccine components Orders Placed This Encounter  Procedures   CBC with Differential/Platelet   CMP14+EGFR    Arville Care, MD [facility] 09/17/2023, 4:07 PM

## 2023-09-18 LAB — CBC WITH DIFFERENTIAL/PLATELET
Basophils Absolute: 0 10*3/uL (ref 0.0–0.2)
Basos: 0 %
EOS (ABSOLUTE): 0.2 10*3/uL (ref 0.0–0.4)
Eos: 2 %
Hematocrit: 30.1 % — ABNORMAL LOW (ref 34.0–46.6)
Hemoglobin: 9.6 g/dL — ABNORMAL LOW (ref 11.1–15.9)
Immature Grans (Abs): 0.1 10*3/uL (ref 0.0–0.1)
Immature Granulocytes: 1 %
Lymphocytes Absolute: 1.9 10*3/uL (ref 0.7–3.1)
Lymphs: 18 %
MCH: 29.7 pg (ref 26.6–33.0)
MCHC: 31.9 g/dL (ref 31.5–35.7)
MCV: 93 fL (ref 79–97)
Monocytes Absolute: 1.1 10*3/uL — ABNORMAL HIGH (ref 0.1–0.9)
Monocytes: 11 %
Neutrophils Absolute: 6.9 10*3/uL (ref 1.4–7.0)
Neutrophils: 68 %
Platelets: 567 10*3/uL — ABNORMAL HIGH (ref 150–450)
RBC: 3.23 x10E6/uL — ABNORMAL LOW (ref 3.77–5.28)
RDW: 15.2 % (ref 11.7–15.4)
WBC: 10.2 10*3/uL (ref 3.4–10.8)

## 2023-09-18 LAB — CMP14+EGFR
ALT: 16 IU/L (ref 0–32)
AST: 18 IU/L (ref 0–40)
Albumin: 3.3 g/dL — ABNORMAL LOW (ref 3.9–4.9)
Alkaline Phosphatase: 102 IU/L (ref 44–121)
BUN/Creatinine Ratio: 19 (ref 12–28)
BUN: 12 mg/dL (ref 8–27)
Bilirubin Total: <0.2 mg/dL (ref 0.0–1.2)
CO2: 28 mmol/L (ref 20–29)
Calcium: 9.4 mg/dL (ref 8.7–10.3)
Chloride: 101 mmol/L (ref 96–106)
Creatinine, Ser: 0.64 mg/dL (ref 0.57–1.00)
Globulin, Total: 2.1 g/dL (ref 1.5–4.5)
Glucose: 99 mg/dL (ref 70–99)
Potassium: 4.8 mmol/L (ref 3.5–5.2)
Sodium: 143 mmol/L (ref 134–144)
Total Protein: 5.4 g/dL — ABNORMAL LOW (ref 6.0–8.5)
eGFR: 99 mL/min/{1.73_m2} (ref >59)

## 2023-09-23 DIAGNOSIS — G894 Chronic pain syndrome: Secondary | ICD-10-CM | POA: Diagnosis not present

## 2023-09-23 DIAGNOSIS — Z09 Encounter for follow-up examination after completed treatment for conditions other than malignant neoplasm: Secondary | ICD-10-CM | POA: Diagnosis not present

## 2023-09-23 DIAGNOSIS — Z79899 Other long term (current) drug therapy: Secondary | ICD-10-CM | POA: Diagnosis not present

## 2023-09-23 DIAGNOSIS — R110 Nausea: Secondary | ICD-10-CM | POA: Diagnosis not present

## 2023-09-23 DIAGNOSIS — R030 Elevated blood-pressure reading, without diagnosis of hypertension: Secondary | ICD-10-CM | POA: Diagnosis not present

## 2023-09-23 DIAGNOSIS — G629 Polyneuropathy, unspecified: Secondary | ICD-10-CM | POA: Diagnosis not present

## 2023-09-23 DIAGNOSIS — M4802 Spinal stenosis, cervical region: Secondary | ICD-10-CM | POA: Diagnosis not present

## 2023-09-23 DIAGNOSIS — E119 Type 2 diabetes mellitus without complications: Secondary | ICD-10-CM | POA: Diagnosis not present

## 2023-09-24 ENCOUNTER — Encounter: Payer: Self-pay | Admitting: Family Medicine

## 2023-09-25 DIAGNOSIS — J449 Chronic obstructive pulmonary disease, unspecified: Secondary | ICD-10-CM | POA: Diagnosis not present

## 2023-09-28 DIAGNOSIS — Z79899 Other long term (current) drug therapy: Secondary | ICD-10-CM | POA: Diagnosis not present

## 2023-09-29 ENCOUNTER — Other Ambulatory Visit: Payer: Self-pay | Admitting: Family Medicine

## 2023-09-29 NOTE — Telephone Encounter (Signed)
 Copied from CRM [id number]. Topic: Clinical - Medication Refill >> Sep 29, 2023  5:35 PM DeAngela L wrote: Most Recent Primary Care Visit:  Provider: Arville Care A  Department: [facility]  Visit Type: ACUTE  Date: 09/17/2023  Medication: ondansetron (ZOFRAN-ODT) 4 MG disintegrating tablet   Has the patient contacted their pharmacy? Yes, the Patient is out of medication and needs the prescription fill urgently to pick up before she leaves to go out of town tomorrow for mothers funeral  (Agent: If no, request that the patient contact the pharmacy for the refill. If patient does not wish to contact the pharmacy document the reason why and proceed with request.) (Agent: If yes, when and what did the pharmacy advise?)  Is this the correct pharmacy for this prescription? yes If no, delete pharmacy and type the correct one.  This is the patient's preferred pharmacy:  [id number] - [place], [place] - [address] [place] [address] Phone: [phone] Fax: [phone]   Has the prescription been filled recently? yes  Is the patient out of the medication? yes  Has the patient been seen for an appointment in the last year OR does the patient have an upcoming appointment? Yes   Can we respond through MyChart? Yes   Agent: Please be advised that Rx refills may take up to 3 business days. We ask that you follow-up with your pharmacy.

## 2023-09-29 NOTE — Telephone Encounter (Signed)
 Copied from CRM [id number]. Topic: Clinical - Medication Refill >> Sep 29, 2023  2:14 PM Izetta Dakin wrote: Most Recent Primary Care Visit:  Provider: Arville Care A  Department: [facility]  Visit Type: ACUTE  Date: 09/17/2023  Medication:  ondansetron (ZOFRAN-ODT) 4 MG disintegrating tablet  Has the patient contacted their pharmacy? No (Agent: If no, request that the patient contact the pharmacy for the refill. If patient does not wish to contact the pharmacy document the reason why and proceed with request.) (Agent: If yes, when and what did the pharmacy advise?)  Is this the correct pharmacy for this prescription? Yes If no, delete pharmacy and type the correct one.  This is the patient's preferred pharmacy:  [id number] - [place], [place] - [address] [place] [address] Phone: [phone] Fax: [phone]   Has the prescription been filled recently? No  Is the patient out of the medication? Yes  Has the patient been seen for an appointment in the last year OR does the patient have an upcoming appointment? Yes  Can we respond through MyChart? No, request phone call.   Agent: Please be advised that Rx refills may take up to 3 business days. We ask that you follow-up with your pharmacy.

## 2023-09-29 NOTE — Telephone Encounter (Unsigned)
 Copied from CRM [id number]. Topic: Clinical - Medication Refill >> Sep 29, 2023  3:25 PM Carlatta H wrote: Most Recent Primary Care Visit:  Provider: Arville Care A  Department: [facility]  Visit Type: ACUTE  Date: 09/17/2023  Medication: ondansetron (ZOFRAN-ODT) 4 MG disintegrating tablet [[id number]]  Has the patient contacted their pharmacy? Yes (Agent: If no, request that the patient contact the pharmacy for the refill. If patient does not wish to contact the pharmacy document the reason why and proceed with request.) (Agent: If yes, when and what did the pharmacy advise?)  Is this the correct pharmacy for this prescription? Yes If no, delete pharmacy and type the correct one.  This is the patient's preferred pharmacy:  [id number] - [place], [place] - [address] [place] [address] Phone: [phone] Fax: [phone]   Has the prescription been filled recently? No  Is the patient out of the medication? Yes  Has the patient been seen for an appointment in the last year OR does the patient have an upcoming appointment? No  Can we respond through MyChart? Yes  Agent: Please be advised that Rx refills may take up to 3 business days. We ask that you follow-up with your pharmacy.

## 2023-09-29 NOTE — Telephone Encounter (Signed)
 Last Fill: 09/11/23 20 tabs/0 RF  Last OV: 09/17/23 Next OV: 10/15/23  Routing to provider for review/authorization.

## 2023-09-30 ENCOUNTER — Other Ambulatory Visit: Payer: Self-pay

## 2023-09-30 MED ORDER — ONDANSETRON HCL 4 MG PO TABS: 4.0000 mg | ORAL_TABLET | ORAL | 1 refills | Status: AC | PRN

## 2023-10-06 ENCOUNTER — Other Ambulatory Visit: Payer: Self-pay | Admitting: *Deleted

## 2023-10-06 NOTE — Patient Outreach (Signed)
 Care Coordination  10/06/2023  ALMIRA PHETTEPLACE 15-Nov-1960 [id number]  Successful telephone outreach with Ms. Francesca Jewett. Patient unable to keep this telephone appointment due to the death of her mother in law this morning. Patient would like to reschedule. A new telephone visit was scheduled on 10/13/23 at 11:15am. Patient agreed to new date and time.   Estanislado Emms RN, BSN [place]  [facility] RN Care Manager [phone]

## 2023-10-08 DIAGNOSIS — M4802 Spinal stenosis, cervical region: Secondary | ICD-10-CM | POA: Diagnosis not present

## 2023-10-08 DIAGNOSIS — R110 Nausea: Secondary | ICD-10-CM | POA: Diagnosis not present

## 2023-10-08 DIAGNOSIS — G894 Chronic pain syndrome: Secondary | ICD-10-CM | POA: Diagnosis not present

## 2023-10-08 DIAGNOSIS — E119 Type 2 diabetes mellitus without complications: Secondary | ICD-10-CM | POA: Diagnosis not present

## 2023-10-08 DIAGNOSIS — G629 Polyneuropathy, unspecified: Secondary | ICD-10-CM | POA: Diagnosis not present

## 2023-10-08 DIAGNOSIS — Z79899 Other long term (current) drug therapy: Secondary | ICD-10-CM | POA: Diagnosis not present

## 2023-10-13 ENCOUNTER — Other Ambulatory Visit: Payer: Self-pay | Admitting: *Deleted

## 2023-10-13 DIAGNOSIS — J449 Chronic obstructive pulmonary disease, unspecified: Secondary | ICD-10-CM | POA: Diagnosis not present

## 2023-10-13 DIAGNOSIS — J9601 Acute respiratory failure with hypoxia: Secondary | ICD-10-CM | POA: Diagnosis not present

## 2023-10-13 NOTE — Patient Outreach (Signed)
 Medicaid Managed Care   Nurse Care Manager Note  10/13/2023 Name:  Margaret Vaughn MRN:  [id number] DOB:  November 05, 1960  Margaret Vaughn is an 63 y.o. year old female who is a primary patient of Dettinger, Elige Radon, MD.  [facility] Managed Care Coordination team was consulted for assistance with:    COPD Anemia  [name] was given information about Medicaid Managed Care Coordination team services today. [name] Patient agreed to services and verbal consent obtained.  Engaged with patient by telephone for follow up visit in response to provider referral for case management and/or care coordination services.   Patient is participating in a Managed Medicaid Plan:  Yes  Assessments/Interventions:  Review of past medical history, allergies, medications, health status, including review of consultants reports, laboratory and other test data, was performed as part of comprehensive evaluation and provision of chronic care management services.  SDOH (Social Drivers of Health) assessments and interventions performed: SDOH Interventions    Flowsheet Row Patient Outreach Telephone from 08/23/2023 in [place] [facility] Office Visit from 07/16/2023 in [facility] Office Visit from 04/16/2023 in [facility] Office Visit from 09/25/2022 in [facility] Office Visit from 06/26/2022 in [facility] Office Visit from 03/20/2022 in [facility]  SDOH Interventions        Food Insecurity Interventions Intervention Not Indicated -- -- -- -- --  Housing Interventions Intervention Not Indicated -- -- -- -- --  Transportation Interventions Intervention Not Indicated -- -- -- -- --  Utilities Interventions Intervention Not Indicated -- -- -- -- --  Depression Interventions/Treatment  -- Medication, Currently on  Treatment Medication Medication Medication, Currently on Treatment Medication       Care Plan  Allergies  Allergen Reactions   Avelox [Moxifloxacin] Anaphylaxis   Iohexol Anaphylaxis    Pt given 300 for CT Angio was given 50 mg benadryl prior to scan had no problems. Anaphylaxis?   Shellfish Allergy Hives    Medications Reviewed Today     Reviewed by Heidi Dach, RN (Registered Nurse) on 10/13/23 at 1201  Med List Status: <None>   Medication Order Taking? Sig Documenting Provider Last Dose Status Informant  albuterol (PROVENTIL) (2.5 MG/3ML) 0.083% nebulizer solution [id number] Yes Take 3 mLs (2.5 mg total) by nebulization every 6 (six) hours as needed for wheezing or shortness of breath. [name], PA-C Taking Active   albuterol (VENTOLIN HFA) 108 (90 Base) MCG/ACT inhaler [id number] Yes INHALE 2 PUFFS BY MOUTH EVERY 6 HOURS AS NEEDED FOR WHEEZE OR SHORTNESS OF BREATH Dettinger, Elige Radon, MD Taking Active Self  AMITIZA 24 MCG capsule [id number] Yes TAKE 1 CAPSULE (24 MCG TOTAL) BY MOUTH 2 (TWO) TIMES DAILY WITH A MEAL. [name], NP Taking Active Self           Med Note (Ceazia Harb A   Mon Aug 23, 2023  2:09 PM) Taking as needed  aspirin EC 81 MG tablet [id number] Yes Take 81 mg by mouth daily. Swallow whole. [provider] Taking Active Self           Med Note (Shanae Luo A   Wed Oct 13, 2023 11:44 AM) Taking 325mg   atorvastatin (LIPITOR) 40 MG tablet [id number] Yes Take 1 tablet (40 mg total) by mouth daily. Dettinger, Elige Radon, MD Taking Active Self  b complex vitamins capsule [id number]  Yes Take 1 capsule by mouth daily. [provider] Taking Active   baclofen (LIORESAL) 10 MG tablet [id number] Yes Take 10 mg by mouth 3 (three) times daily as needed for muscle spasms. [provider] Taking Active Self  BELBUCA 300 MCG FILM [id number] Yes Place 300 mcg inside cheek 2 (two) times daily as needed (pain). [provider] Taking  Active Self           Med Note (Zacari Radick A   Wed Oct 13, 2023 11:49 AM) Taking twice daily as needed  benzonatate (TESSALON) 200 MG capsule [id number] Yes Take 1 capsule (200 mg total) by mouth 3 (three) times daily as needed for cough. Dettinger, Elige Radon, MD Taking Active Self  budesonide-formoterol [facility]) 160-4.5 MCG/ACT inhaler [id number] Yes Inhale 2 puffs into the lungs 2 (two) times daily. Hunsucker, Lesia Sago, MD Taking Active   Cyanocobalamin (VITAMIN B-12 PO) [id number] Yes Take by mouth. [provider] Taking Active   DULoxetine (CYMBALTA) 60 MG capsule [id number] Yes TAKE 1 CAPSULE (60 MG TOTAL) BY MOUTH DAILY. Dettinger, Elige Radon, MD Taking Active   enalapril (VASOTEC) 5 MG tablet [id number] No Take 1 tablet (5 mg total) by mouth daily.  Patient not taking: Reported on 10/13/2023   Dettinger, Elige Radon, MD Not Taking Active Self  ferrous sulfate 325 (65 FE) MG tablet [id number] Yes Take 325 mg by mouth 2 (two) times daily with a meal. [provider] Taking Active   Multiple Vitamins-Minerals (HAIR SKIN NAILS PO) [id number] Yes Take 3 tablets by mouth daily. [provider] Taking Active Self  Multiple Vitamins-Minerals (MULTIVITAMIN WITH MINERALS) tablet [id number] Yes Take 1 tablet by mouth daily. [provider] Taking Active Self  naloxone [facility]) nasal spray 4 mg/0.1 mL [id number] No Place 1 spray into the nose once.  Patient not taking: Reported on 10/13/2023   [provider] Not Taking Active Self           Med Note ([name], [name]   Thu Aug 12, 2023  7:29 PM) Never used, has at home  nicotine (NICODERM CQ - DOSED IN MG/24 HOURS) 21 mg/24hr patch [id number] Yes Place 21 mg onto the skin daily as needed (smoking cessation). [provider] Taking Active Self  nystatin (MYCOSTATIN) 100000 UNIT/ML suspension [id number] Yes Take 5 mLs by mouth 4 (four) times daily as needed (thrush). [provider] Taking  Active Self  ondansetron (ZOFRAN) 4 MG tablet [id number] Yes Take 1 tablet (4 mg total) by mouth as needed for nausea or vomiting. Take one 1 tablet every 8 hours as needed Dettinger, Elige Radon, MD Taking Active   ondansetron (ZOFRAN-ODT) 4 MG disintegrating tablet [id number] Yes 4mg  ODT q4 hours prn nausea/vomit Pollina, Canary Brim, MD Taking Active   oxyCODONE (ROXICODONE) 15 MG immediate release tablet [id number] Yes Take 15 mg by mouth 4 (four) times daily as needed for pain. [provider] Taking Active Self           Med Note (Domenico Achord A   Wed Oct 13, 2023 11:52 AM) Taking 10 mg   predniSONE (DELTASONE) 20 MG tablet [id number] No 2 po at same time daily for 5 days  Patient not taking: Reported on 10/13/2023   Dettinger, Elige Radon, MD Not Taking Active            Med Note (Shaneca Orne A   Wed Oct 13, 2023 11:57 AM) completed  pregabalin (LYRICA) 150 MG capsule [id number] Yes Take 1  capsule (150 mg total) by mouth 3 (three) times daily. Dettinger, Elige Radon, MD Taking Active Self  Pyridoxine HCl (VITAMIN B-6 PO) [id number] Yes Take by mouth. [provider] Taking Active   sitaGLIPtin-metformin (JANUMET) 50-1000 MG tablet [id number] Yes TAKE 1 TABLET BY MOUTH TWICE A DAY WITH FOOD Dettinger, Elige Radon, MD Taking Active Self  Tiotropium Bromide Monohydrate (SPIRIVA RESPIMAT) 2.5 MCG/ACT AERS [id number] Yes Inhale 2 puffs into the lungs daily. Hunsucker, Lesia Sago, MD Taking Active   zolpidem (AMBIEN CR) 6.25 MG CR tablet [id number] No Take 6.25 mg by mouth at bedtime.  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   Myelopathy (HCC) 03/10/2021   HNP  (herniated nucleus pulposus) with myelopathy, cervical 12/18/2020   Cervical myelopathy (HCC) 08/13/2020   Cervical spinal cord compression (HCC) 08/08/2020   Chronic respiratory failure with hypoxia (HCC) 08/08/2020   Type 2 diabetes mellitus with diabetic polyneuropathy, without long-term current use of insulin (HCC) 08/08/2020   Fibromyalgia 01/06/2019   Neuropathy 01/06/2019   Overweight 03/11/2018   COPD (chronic obstructive pulmonary disease) (HCC) 03/11/2018   Mixed diabetic hyperlipidemia associated with type 2 diabetes mellitus (HCC) 03/11/2018   PTSD (post-traumatic stress disorder) 03/11/2018   Myofascial pain 09/23/2017   GAD (generalized anxiety disorder) 01/30/2014   Essential hypertension, benign 01/30/2014   OSA (obstructive sleep apnea) 11/23/2013   Dysthymic disorder 03/22/2013   Memory loss 03/22/2013   Hyperlipidemia 08/10/2012   Tobacco abuse 08/10/2012   GERD 12/24/2009    Conditions to be addressed/monitored per PCP order:  COPD and Anemia  Care Plan : RN Care Manager Plan of Care  Updates made by Heidi Dach, RN since 10/13/2023 12:00 AM     Problem: Health Managment needs related to COPD      Long-Range Goal: Development of Plan of Care to address Health Managment needs related to COPD   Start Date: 08/23/2023  Expected End Date: 11/21/2023  Note:   Current Barriers:  Chronic Disease Management support and education needs related to COPD   RNCM Clinical Goal(s):  Patient will take all medications exactly as prescribed and will call provider for medication related questions as evidenced by patient reports attend all scheduled medical appointments: 10/15/23 with PCP and Pulmonolgy on 11/22/23 as evidenced by provider documentation in EMR continue to work with RN Care Manager to address care management and care coordination needs related to  COPD as evidenced by adherence to CM Team Scheduled appointments through collaboration with RN Care manager,  provider, and care team.   Interventions: Evaluation of current treatment plan related to  self management and patient's adherence to plan as established by provider   Anemia/Bleeding Interventions:  (Status:  New goal.) Long Term Goal  Assessment of understanding of anemia/bleeding disorder diagnosis  Basic overview and discussion of anemia/bleeding disorder or acute disease state  Medications reviewed  Counseled on avoidance of NSAIDs due to increased bleeding risk Counseled on importance of regular laboratory monitoring as directed by provider Provided education about signs and symptoms of active bleeding such as stomach discomfort, coughing up blood  or blood tinged secretions, bleeding from the gums/teeth, nosebleeds, increased bruising, blood in the urine/stool and/or if a traumatic injury occurs, regardless of severity of injury  Advised to call provider or 911 if active bleeding or signs and symptoms of active bleeding occur recommended promotion of rest and energy-conserving measures to manage fatigue, such as balancing activity with periods of rest encouraged strategies to prevent falls related to fatigue, weakness and dizziness; encouraged sitting before standing and using an assistive device encouraged dietary changes to increase dietary intake of iron, Vitamin B12 and folic acid as advised/prescribed Provided education on iron rich foods Lab Results  Component Value Date   WBC 10.2 09/17/2023   HGB 9.6 (L) 09/17/2023   HCT 30.1 (L) 09/17/2023   MCV 93 09/17/2023   PLT 567 (H) 09/17/2023      COPD Interventions:  (Status:  Goal on track:  Yes.) Long Term Goal Provided patient with basic written and verbal COPD education on self care/management/and exacerbation prevention Provided instruction about proper use of medications used for management of COPD including inhalers Advised patient to self assesses COPD action plan zone and make appointment with provider if in the yellow  zone for 48 hours without improvement Provided education about and advised patient to utilize infection prevention strategies to reduce risk of respiratory infection Discussed the importance of adequate rest and management of fatigue with COPD Assessed social determinant of health barriers Discussed and advised smoking cessation-patient declines at this time   Patient Goals/Self-Care Activities: Take all medications as prescribed Attend all scheduled provider appointments Call provider office for new concerns or questions  follow rescue plan if symptoms flare-up keep follow-up appointments: 10/15/23 with PCP and Pulmonology 11/22/23 use an extra pillow to sleep practice relaxation or meditation daily Begin to cut back on smoking  Follow Up Plan:  Telephone follow up appointment with care management team member scheduled for:  11/17/23 at 2pm      Follow Up:  Patient agrees to Care Plan and Follow-up.  Plan: The Managed Medicaid care management team will reach out to the patient again over the next 30 days.  Date/time of next scheduled RN care management/care coordination outreach:  11/17/23 at 2pm  Estanislado Emms RN, BSN [place]  [facility] RN Care Manager [phone]

## 2023-10-13 NOTE — Patient Instructions (Signed)
 Visit Information  Ms. Greenhouse was given information about Medicaid Managed Care team care coordination services as a part of their Healthy [employer] Medicaid benefit. [name] verbally consented to engagement with the [facility] Managed Care team.   If you are experiencing a medical emergency, please call 911 or report to your local emergency department or urgent care.   If you have a non-emergency medical problem during routine business hours, please contact your provider's office and ask to speak with a nurse.   For questions related to your Healthy [facility] health plan, please call: [phone] or visit the homepage here: [email or web address]  If you would like to schedule transportation through your Healthy [facility] plan, please call the following number at least 2 days in advance of your appointment: [phone]  For information about your ride after you set it up, call Ride Assist at [phone]. Use this number to activate a Will Call pickup, or if your transportation is late for a scheduled pickup. Use this number, too, if you need to make a change or cancel a previously scheduled reservation.  If you need transportation services right away, call [phone]. The after-hours call center is staffed 24 hours to handle ride assistance and urgent reservation requests (including discharges) 365 days a year. Urgent trips include sick visits, hospital discharge requests and life-sustaining treatment.  Call the [facility] Line at [phone], at any time, 24 hours a day, 7 days a week. If you are in danger or need immediate medical attention call 911.  If you would like help to quit smoking, call 1-800-QUIT-NOW (936-828-7091) OR Espaol: 1-855-Djelo-Ya (4-742-595-6387) o para ms informacin haga clic aqu or Text READY to 564-332 to register via text  Ms. Francesca Jewett,   Please see education materials related to iron rich food  provided by MyChart link.  Patient verbalizes understanding of instructions and care plan provided today and agrees to view in MyChart. Active MyChart status and patient understanding of how to access instructions and care plan via MyChart confirmed with patient.     Telephone follow up appointment with Managed Medicaid care management team member scheduled for:11/17/23 at 2pm  Estanislado Emms RN, BSN [place]  [facility]Based [facility] Health RN Care Manager [phone]   Following is a copy of your plan of care:  Care Plan : RN Care Manager Plan of Care  Updates made by Heidi Dach, RN since 10/13/2023 12:00 AM     Problem: Health Managment needs related to COPD      Long-Range Goal: Development of Plan of Care to address Health Managment needs related to COPD   Start Date: 08/23/2023  Expected End Date: 11/21/2023  Note:   Current Barriers:  Chronic Disease Management support and education needs related to COPD   RNCM Clinical Goal(s):  Patient will take all medications exactly as prescribed and will call provider for medication related questions as evidenced by patient reports attend all scheduled medical appointments: 10/15/23 with PCP and Pulmonolgy on 11/22/23 as evidenced by provider documentation in EMR continue to work with RN Care Manager to address care management and care coordination needs related to  COPD as evidenced by adherence to CM Team Scheduled appointments through collaboration with RN Care manager, provider, and care team.   Interventions: Evaluation of current treatment plan related to  self management and patient's adherence to plan as established by provider   Anemia/Bleeding Interventions:  (Status:  New goal.) Long Term Goal  Assessment of understanding of anemia/bleeding  disorder diagnosis  Basic overview and discussion of anemia/bleeding disorder or acute disease state  Medications reviewed  Counseled on avoidance of NSAIDs due to  increased bleeding risk Counseled on importance of regular laboratory monitoring as directed by provider Provided education about signs and symptoms of active bleeding such as stomach discomfort, coughing up blood or blood tinged secretions, bleeding from the gums/teeth, nosebleeds, increased bruising, blood in the urine/stool and/or if a traumatic injury occurs, regardless of severity of injury  Advised to call provider or 911 if active bleeding or signs and symptoms of active bleeding occur recommended promotion of rest and energy-conserving measures to manage fatigue, such as balancing activity with periods of rest encouraged strategies to prevent falls related to fatigue, weakness and dizziness; encouraged sitting before standing and using an assistive device encouraged dietary changes to increase dietary intake of iron, Vitamin B12 and folic acid as advised/prescribed Provided education on iron rich foods Lab Results  Component Value Date   WBC 10.2 09/17/2023   HGB 9.6 (L) 09/17/2023   HCT 30.1 (L) 09/17/2023   MCV 93 09/17/2023   PLT 567 (H) 09/17/2023      COPD Interventions:  (Status:  Goal on track:  Yes.) Long Term Goal Provided patient with basic written and verbal COPD education on self care/management/and exacerbation prevention Provided instruction about proper use of medications used for management of COPD including inhalers Advised patient to self assesses COPD action plan zone and make appointment with provider if in the yellow zone for 48 hours without improvement Provided education about and advised patient to utilize infection prevention strategies to reduce risk of respiratory infection Discussed the importance of adequate rest and management of fatigue with COPD Assessed social determinant of health barriers Discussed and advised smoking cessation-patient declines at this time   Patient Goals/Self-Care Activities: Take all medications as prescribed Attend all  scheduled provider appointments Call provider office for new concerns or questions  follow rescue plan if symptoms flare-up keep follow-up appointments: 10/15/23 with PCP and Pulmonology 11/22/23 use an extra pillow to sleep practice relaxation or meditation daily Begin to cut back on smoking  Follow Up Plan:  Telephone follow up appointment with care management team member scheduled for:  11/17/23 at 2pm

## 2023-10-14 DIAGNOSIS — Z79899 Other long term (current) drug therapy: Secondary | ICD-10-CM | POA: Diagnosis not present

## 2023-10-15 ENCOUNTER — Encounter: Payer: Self-pay | Admitting: Family Medicine

## 2023-10-15 ENCOUNTER — Ambulatory Visit: Payer: Medicaid Other | Admitting: Family Medicine

## 2023-10-15 ENCOUNTER — Ambulatory Visit (INDEPENDENT_AMBULATORY_CARE_PROVIDER_SITE_OTHER)

## 2023-10-15 VITALS — BP 143/85 | HR 97 | Temp 98.9°F | Ht 64.0 in | Wt 138.0 lb

## 2023-10-15 DIAGNOSIS — J449 Chronic obstructive pulmonary disease, unspecified: Secondary | ICD-10-CM | POA: Diagnosis not present

## 2023-10-15 DIAGNOSIS — W19XXXD Unspecified fall, subsequent encounter: Secondary | ICD-10-CM

## 2023-10-15 DIAGNOSIS — D649 Anemia, unspecified: Secondary | ICD-10-CM

## 2023-10-15 DIAGNOSIS — M19011 Primary osteoarthritis, right shoulder: Secondary | ICD-10-CM | POA: Diagnosis not present

## 2023-10-15 DIAGNOSIS — L659 Nonscarring hair loss, unspecified: Secondary | ICD-10-CM | POA: Diagnosis not present

## 2023-10-15 DIAGNOSIS — I10 Essential (primary) hypertension: Secondary | ICD-10-CM

## 2023-10-15 DIAGNOSIS — J9611 Chronic respiratory failure with hypoxia: Secondary | ICD-10-CM

## 2023-10-15 DIAGNOSIS — M25511 Pain in right shoulder: Secondary | ICD-10-CM

## 2023-10-15 MED ORDER — PREDNISONE 20 MG PO TABS: 40.0000 mg | ORAL_TABLET | Freq: Every day | ORAL | 0 refills | Status: AC

## 2023-10-15 NOTE — Progress Notes (Signed)
 Subjective:  Patient ID: Margaret Vaughn, female    DOB: [birth year], 63 y.o.   MRN: [id number]  Patient Care Team: Dettinger, Elige Radon, MD as PCP - General (Family Medicine) Heidi Dach, RN as [facility] Care Management   Chief Complaint:  f/u labwrk (Low hgb/Discuss iron supplements/Would b12 be helpful/fatigued)  HPI: Margaret Vaughn is a 63 y.o. female presenting on 10/15/2023 for f/u labwrk (Low hgb/Discuss iron supplements/Would b12 be helpful/fatigued)  Patient presents today to discuss fatigue. She was seen in ED 09/07/23 for fall with laceration with bleeding. 09/07/23 had anemia on labs. She was advised to start OTC iron supplementation. She continues to get winded and tired. Has to sit down after walking some distances. Reports that on Wednesday she started feeling slightly better. She has been taking iron supplementation twice per day. She states that she had vitamin B12 deficiency in pregnancy and wonders if that contributes. In addition, notes hair thinning and would like to check her thyroid.   States that in addition her shoulder has pain since she fell last month. Reports decreased range of motion. Reports that it radiates down to her elbow. She is wearing a "stabilizer" on her right shoulder that she purchased. She states that this pain has not improved. She is taking ibuprofen and naproxen at the same time. States that she was told to stop taking them together yesterday by RN, but she continued to take both last night.   Chronic respiratory failure Patient is utilizing oxygen. Reports inhaler use every 12 hours. Nebulizer rarely, has not used in over a week. She is following up with pulmnology next week. States that she is not as short as breath    Relevant past medical, surgical, family, and social history reviewed and updated as indicated.  Allergies and medications reviewed and updated. Data reviewed: Chart in Epic.   Past Medical History:  Diagnosis Date   Acid  reflux    Allergies    Anemia    Asthma    Bronchitis    Cervical stenosis of spine    Chronic pain syndrome    Complication of anesthesia    difficulty waking up from anesthesia   COPD (chronic obstructive pulmonary disease) (HCC)    DDD (degenerative disc disease), lumbar    Depression with anxiety    Diabetes mellitus without complication (HCC)    Dyspnea    Dysrhythmia    PSVT   Fatigue    H/O blood clots    H/O echocardiogram 1989   Per pt, done @ [facility]- Showed mitral valve prolapse   Headache(784.0)    Heart murmur    Hemorrhoids, external    History of hiatal hernia    History of kidney stones    History of PSVT (paroxysmal supraventricular tachycardia)    HTN (hypertension)    Hyperlipidemia    Hypoxemia    Insomnia    Memory loss    Myalgia and myositis    Narcolepsy    On home O2    2-4 L @ HS   OSA (obstructive sleep apnea)    Pneumonia    Sleeping difficulty    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HCC)    Thoracalgia    Tobacco abuse    Tremor    Ulcer disease     Past Surgical History:  Procedure Laterality Date   ABDOMINAL HYSTERECTOMY     ANTERIOR CERVICAL DECOMP/DISCECTOMY FUSION N/A 12/18/2020   Procedure: CERVICAL TWO-THREE ANTERIOR CERVICAL DISCECTOMY/DECOMPRESSION FUSION;  Surgeon: Donalee Citrin, MD;  Location: [facility] OR;  Service: Neurosurgery;  Laterality: N/A;   APPENDECTOMY  1992   BIOPSY  07/01/2022   Procedure: BIOPSY;  Surgeon: Corbin Ade, MD;  [facility];  Service: Endoscopy;;   CHOLECYSTECTOMY  1992   COLONOSCOPY WITH PROPOFOL N/A 07/01/2022   Procedure: COLONOSCOPY WITH PROPOFOL;  Surgeon: Corbin Ade, MD;  [facility];  Service: Endoscopy;  Laterality: N/A;  12:00 pm   DIAGNOSTIC LAPAROSCOPY  1992   lap chole   ESOPHAGOGASTRODUODENOSCOPY (EGD) WITH PROPOFOL N/A 07/01/2022   Procedure: ESOPHAGOGASTRODUODENOSCOPY (EGD) WITH PROPOFOL;  Surgeon: Corbin Ade, MD;  [facility];  Service: Endoscopy;   Laterality: N/A;   HEMOSTASIS CLIP PLACEMENT  07/01/2022   Procedure: HEMOSTASIS CLIP PLACEMENT;  Surgeon: Corbin Ade, MD;  [facility];  Service: Endoscopy;;   HERNIA REPAIR     umbilical... as a child   MALONEY DILATION  07/01/2022   Procedure: MALONEY DILATION;  Surgeon: Corbin Ade, MD;  [facility];  Service: Endoscopy;;   OOPHORECTOMY     POLYPECTOMY  07/01/2022   Procedure: POLYPECTOMY;  Surgeon: Corbin Ade, MD;  [facility];  Service: Endoscopy;;   POSTERIOR CERVICAL FUSION/FORAMINOTOMY N/A 08/07/2020   Procedure: POSTERIOR CERVICAL LAMINECTOMY FOR MYELOPATHY;  Surgeon: Donalee Citrin, MD;  Location: [facility] OR;  Service: Neurosurgery;  Laterality: N/A;   POSTERIOR CERVICAL FUSION/FORAMINOTOMY N/A 03/10/2021   Procedure: Posterior cervical fusion with lateral mass fixation Cervical one - cervical four;  Surgeon: Donalee Citrin, MD;  [facility] OR;  Service: Neurosurgery;  Laterality: N/A;   POSTERIOR CERVICAL LAMINECTOMY  08/07/2020   w/ Dr. Wynetta Emery   tonsillectomy     TONSILLECTOMY     TUBAL LIGATION      Social History   Socioeconomic History   Marital status: Married    Spouse name: Not on file   Number of children: 5   Years of education: Not on file   Highest education level: Not on file  Occupational History   Occupation: disabled    Employer: UNEMPLOYED  Tobacco Use   Smoking status: Every Day    Current packs/day: 2.00    Average packs/day: 2.0 packs/day for 30.0 years (60.0 ttl pk-yrs)    Types: Cigarettes   Smokeless tobacco: Never   Tobacco comments:    Pt states she is down to 1 ppd. 06/22/2022   Vaping Use   Vaping status: Never Used  Substance and Sexual Activity   Alcohol use: No   Drug use: No   Sexual activity: Yes    Birth control/protection: Surgical  Other Topics Concern   Not on file  Social History Narrative   Not on file   Social Drivers of Health   Financial Resource Strain: Not on file  Food  Insecurity: No Food Insecurity (08/23/2023)   Hunger Vital Sign    Worried About Running Out of Food in the Last Year: Never true    Ran Out of Food in the Last Year: Never true  Transportation Needs: No Transportation Needs (08/23/2023)   PRAPARE - Administrator, Civil Service (Medical): No    Lack of Transportation (Non-Medical): No  Physical Activity: Not on file  Stress: Not on file  Social Connections: Not on file  Intimate Partner Violence: Not At Risk (08/23/2023)   Humiliation, Afraid, Rape, and Kick questionnaire    Fear of Current or Ex-Partner: No  Emotionally Abused: No    Physically Abused: No    Sexually Abused: No    Outpatient Encounter Medications as of 10/15/2023  Medication Sig   albuterol (PROVENTIL) (2.5 MG/3ML) 0.083% nebulizer solution Take 3 mLs (2.5 mg total) by nebulization every 6 (six) hours as needed for wheezing or shortness of breath.   albuterol (VENTOLIN HFA) 108 (90 Base) MCG/ACT inhaler INHALE 2 PUFFS BY MOUTH EVERY 6 HOURS AS NEEDED FOR WHEEZE OR SHORTNESS OF BREATH   AMITIZA 24 MCG capsule TAKE 1 CAPSULE (24 MCG TOTAL) BY MOUTH 2 (TWO) TIMES DAILY WITH A MEAL.   aspirin EC 81 MG tablet Take 81 mg by mouth daily. Swallow whole.   atorvastatin (LIPITOR) 40 MG tablet Take 1 tablet (40 mg total) by mouth daily.   b complex vitamins capsule Take 1 capsule by mouth daily.   baclofen (LIORESAL) 10 MG tablet Take 10 mg by mouth 3 (three) times daily as needed for muscle spasms.   BELBUCA 300 MCG FILM Place 300 mcg inside cheek 2 (two) times daily as needed (pain).   benzonatate (TESSALON) 200 MG capsule Take 1 capsule (200 mg total) by mouth 3 (three) times daily as needed for cough.   budesonide-formoterol (SYMBICORT) 160-4.5 MCG/ACT inhaler Inhale 2 puffs into the lungs 2 (two) times daily.   Cyanocobalamin (VITAMIN B-12 PO) Take by mouth.   DULoxetine (CYMBALTA) 60 MG capsule TAKE 1 CAPSULE (60 MG TOTAL) BY MOUTH DAILY.   enalapril (VASOTEC)  5 MG tablet Take 1 tablet (5 mg total) by mouth daily.   ferrous sulfate 325 (65 FE) MG tablet Take 325 mg by mouth 2 (two) times daily with a meal.   Multiple Vitamins-Minerals (HAIR SKIN NAILS PO) Take 3 tablets by mouth daily.   Multiple Vitamins-Minerals (MULTIVITAMIN WITH MINERALS) tablet Take 1 tablet by mouth daily.   naloxone (NARCAN) nasal spray 4 mg/0.1 mL Place 1 spray into the nose once.   nicotine (NICODERM CQ - DOSED IN MG/24 HOURS) 21 mg/24hr patch Place 21 mg onto the skin daily as needed (smoking cessation).   nystatin (MYCOSTATIN) 100000 UNIT/ML suspension Take 5 mLs by mouth 4 (four) times daily as needed (thrush).   ondansetron (ZOFRAN) 4 MG tablet Take 1 tablet (4 mg total) by mouth as needed for nausea or vomiting. Take one 1 tablet every 8 hours as needed   ondansetron (ZOFRAN-ODT) 4 MG disintegrating tablet 4mg  ODT q4 hours prn nausea/vomit   oxyCODONE (ROXICODONE) 15 MG immediate release tablet Take 15 mg by mouth 4 (four) times daily as needed for pain.   predniSONE (DELTASONE) 20 MG tablet 2 po at same time daily for 5 days   pregabalin (LYRICA) 150 MG capsule Take 1 capsule (150 mg total) by mouth 3 (three) times daily.   Pyridoxine HCl (VITAMIN B-6 PO) Take by mouth.   sitaGLIPtin-metformin (JANUMET) 50-1000 MG tablet TAKE 1 TABLET BY MOUTH TWICE A DAY WITH FOOD   Tiotropium Bromide Monohydrate (SPIRIVA RESPIMAT) 2.5 MCG/ACT AERS Inhale 2 puffs into the lungs daily.   zolpidem (AMBIEN CR) 6.25 MG CR tablet Take 6.25 mg by mouth at bedtime.   No facility-administered encounter medications on file as of 10/15/2023.    Allergies  Allergen Reactions   Avelox [Moxifloxacin] Anaphylaxis   Iohexol Anaphylaxis    Pt given 300 for CT Angio was given 50 mg benadryl prior to scan had no problems. Anaphylaxis?   Shellfish Allergy Hives    Review of Systems As per HPI  Objective:  BP (!) 143/85   Pulse 97   Temp 98.9 F (37.2 C)   Ht 5\' 4"  (1.626 m)   Wt 138 lb  (62.6 kg)   SpO2 97%   BMI 23.69 kg/m    Wt Readings from Last 3 Encounters:  10/15/23 138 lb (62.6 kg)  09/17/23 144 lb (65.3 kg)  09/10/23 138 lb 14.2 oz (63 kg)   Physical Exam Constitutional:      General: She is awake. She is not in acute distress.    Appearance: Normal appearance. She is well-developed and well-groomed. She is ill-appearing.     Comments: Chronically ill-appearing   HENT:     Head:     Comments: [place] in place  Cardiovascular:     Rate and Rhythm: Normal rate and regular rhythm.     Heart sounds: Normal heart sounds.  Pulmonary:     Breath sounds: Examination of the right-middle field reveals wheezing. Examination of the left-middle field reveals wheezing. Examination of the right-lower field reveals wheezing. Examination of the left-lower field reveals wheezing. Decreased breath sounds and wheezing present.  Musculoskeletal:     Right shoulder: Tenderness present. No swelling, deformity, effusion, laceration, bony tenderness or crepitus. Decreased range of motion. Decreased strength. Normal pulse.  Skin:    General: Skin is warm.     Capillary Refill: Capillary refill takes less than 2 seconds.  Neurological:     General: No focal deficit present.     Mental Status: She is alert, oriented to person, place, and time and easily aroused. Mental status is at baseline.  Psychiatric:        Attention and Perception: Attention and perception normal.        Mood and Affect: Mood and affect normal.        Speech: Speech normal.        Behavior: Behavior normal. Behavior is cooperative.        Thought Content: Thought content normal.        Cognition and Memory: Cognition and memory normal.        Judgment: Judgment normal.     Results for orders placed or performed in visit on 09/17/23  CBC with Differential/Platelet   Collection Time: 09/17/23  4:13 PM  Result Value Ref Range   WBC 10.2 3.4 - 10.8 x10E3/uL   RBC 3.23 (L) 3.77 - 5.28 x10E6/uL   Hemoglobin  9.6 (L) 11.1 - 15.9 g/dL   Hematocrit 36.6 (L) 44.0 - 46.6 %   MCV 93 79 - 97 fL   MCH 29.7 26.6 - 33.0 pg   MCHC 31.9 31.5 - 35.7 g/dL   RDW 34.7 42.5 - 95.6 %   Platelets 567 (H) 150 - 450 x10E3/uL   Neutrophils 68 Not Estab. %   Lymphs 18 Not Estab. %   Monocytes 11 Not Estab. %   Eos 2 Not Estab. %   Basos 0 Not Estab. %   Neutrophils Absolute 6.9 1.4 - 7.0 x10E3/uL   Lymphocytes Absolute 1.9 0.7 - 3.1 x10E3/uL   Monocytes Absolute 1.1 (H) 0.1 - 0.9 x10E3/uL   EOS (ABSOLUTE) 0.2 0.0 - 0.4 x10E3/uL   Basophils Absolute 0.0 0.0 - 0.2 x10E3/uL   Immature Granulocytes 1 Not Estab. %   Immature Grans (Abs) 0.1 0.0 - 0.1 x10E3/uL  CMP14+EGFR   Collection Time: 09/17/23  4:13 PM  Result Value Ref Range   Glucose 99 70 - 99 mg/dL   BUN 12 8 - 27 mg/dL  Creatinine, Ser 0.64 0.57 - 1.00 mg/dL   eGFR 99 >11 BJ/YNW/2.95   BUN/Creatinine Ratio 19 12 - 28   Sodium 143 134 - 144 mmol/L   Potassium 4.8 3.5 - 5.2 mmol/L   Chloride 101 96 - 106 mmol/L   CO2 28 20 - 29 mmol/L   Calcium 9.4 8.7 - 10.3 mg/dL   Total Protein 5.4 (L) 6.0 - 8.5 g/dL   Albumin 3.3 (L) 3.9 - 4.9 g/dL   Globulin, Total 2.1 1.5 - 4.5 g/dL   Bilirubin Total <6.2 0.0 - 1.2 mg/dL   Alkaline Phosphatase 102 44 - 121 IU/L   AST 18 0 - 40 IU/L   ALT 16 0 - 32 IU/L   Pertinent labs & imaging results that were available during my care of the patient were reviewed by me and considered in my medical decision making.  Assessment & Plan:  Uchenna was seen today for f/u labwrk.  Diagnoses and all orders for this visit: 1. Anemia, unspecified type (Primary) Reviewed notes from ED, Pollina, MD. 09/10/2023.  Labs as below. Will communicate results to patient once available. Will await results to determine next steps.  - Anemia Profile B - CMP14+EGFR  2. Chronic obstructive pulmonary disease, unspecified COPD type (HCC) Given continued wheezing and shortness of breath, will treat as below. Discussed with patient to  monitor BG. Patient is very well controlled on last A1C check.  - predniSONE (DELTASONE) 20 MG tablet; Take 2 tablets (40 mg total) by mouth daily with breakfast for 5 days.  Dispense: 10 tablet; Refill: 0  3. Chronic respiratory failure with hypoxia [facility]) Established with pulmonology and has follow up next week. Continue to follow with specialty.   4. Acute pain of right shoulder Imaging as below. Will communicate results to patient once available. Will await results to determine next steps.  Medication as below for symptom management.  - DG Shoulder Right - predniSONE (DELTASONE) 20 MG tablet; Take 2 tablets (40 mg total) by mouth daily with breakfast for 5 days.  Dispense: 10 tablet; Refill: 0  5. Fall, subsequent encounter - DG Shoulder Right  6. Essential hypertension, benign Elevated BP in office. Patient reports that she has access to monitor at home. Patient to monitor BP and report measurements to PCP in 2 weeks.   7. Hair thinning - TSH   Continue all other maintenance medications.  Follow up plan: Return if symptoms worsen or fail to improve.   Continue healthy lifestyle choices, including diet (rich in fruits, vegetables, and lean proteins, and low in salt and simple carbohydrates) and exercise (at least 30 minutes of moderate physical activity daily).  Written and verbal instructions provided   The above assessment and management plan was discussed with the patient. The patient verbalized understanding of and has agreed to the management plan. Patient is aware to call the clinic if they develop any new symptoms or if symptoms persist or worsen. Patient is aware when to return to the clinic for a follow-up visit. Patient educated on when it is appropriate to go to the emergency department.   Neale Burly, DNP-FNP [facility] [address] [place], [place] [address] [phone]

## 2023-10-15 NOTE — Patient Instructions (Signed)
Monitoring your BP at home   Your BP was elevated today in office  Please keep a log of your BP at home.  The best time to take BP is 1st thing in the morning after waking.  Sit for 5 minutes with feet flat on the floor, arm at heart level.  Options for BP cuffs are at [employer], [employer], [employer], [facility] & [facility].  We will review measurements at follow up and determine plan for BP management.  If you have access, you can send a message in MyChart with your measurements prior to your follow up appointment.  The brand I recommend to get is Omron (Bronze).    

## 2023-10-16 LAB — CMP14+EGFR
ALT: 15 IU/L (ref 0–32)
AST: 19 IU/L (ref 0–40)
Albumin: 3.7 g/dL — ABNORMAL LOW (ref 3.9–4.9)
Alkaline Phosphatase: 93 IU/L (ref 44–121)
BUN/Creatinine Ratio: 22 (ref 12–28)
BUN: 15 mg/dL (ref 8–27)
Bilirubin Total: <0.2 mg/dL (ref 0.0–1.2)
CO2: 31 mmol/L — ABNORMAL HIGH (ref 20–29)
Calcium: 9.3 mg/dL (ref 8.7–10.3)
Chloride: 99 mmol/L (ref 96–106)
Creatinine, Ser: 0.67 mg/dL (ref 0.57–1.00)
Globulin, Total: 2.2 g/dL (ref 1.5–4.5)
Glucose: 89 mg/dL (ref 70–99)
Potassium: 4.2 mmol/L (ref 3.5–5.2)
Sodium: 142 mmol/L (ref 134–144)
Total Protein: 5.9 g/dL — ABNORMAL LOW (ref 6.0–8.5)
eGFR: 98 mL/min/{1.73_m2} (ref >59)

## 2023-10-16 LAB — ANEMIA PROFILE B
Basophils Absolute: 0 10*3/uL (ref 0.0–0.2)
Basos: 0 %
EOS (ABSOLUTE): 0.3 10*3/uL (ref 0.0–0.4)
Eos: 3 %
Ferritin: 30 ng/mL (ref 15–150)
Folate: 18.6 ng/mL (ref >3.0)
Hematocrit: 36.6 % (ref 34.0–46.6)
Hemoglobin: 11.4 g/dL (ref 11.1–15.9)
Immature Grans (Abs): 0 10*3/uL (ref 0.0–0.1)
Immature Granulocytes: 1 %
Iron Saturation: 23 % (ref 15–55)
Iron: 88 ug/dL (ref 27–139)
Lymphocytes Absolute: 1.4 10*3/uL (ref 0.7–3.1)
Lymphs: 17 %
MCH: 28.9 pg (ref 26.6–33.0)
MCHC: 31.1 g/dL — ABNORMAL LOW (ref 31.5–35.7)
MCV: 93 fL (ref 79–97)
Monocytes Absolute: 1 10*3/uL — ABNORMAL HIGH (ref 0.1–0.9)
Monocytes: 11 %
Neutrophils Absolute: 5.9 10*3/uL (ref 1.4–7.0)
Neutrophils: 68 %
Platelets: 411 10*3/uL (ref 150–450)
RBC: 3.94 x10E6/uL (ref 3.77–5.28)
RDW: 15.7 % — ABNORMAL HIGH (ref 11.7–15.4)
Retic Ct Pct: 3.1 % — ABNORMAL HIGH (ref 0.6–2.6)
Total Iron Binding Capacity: 383 ug/dL (ref 250–450)
UIBC: 295 ug/dL (ref 118–369)
Vitamin B-12: 1035 pg/mL (ref 232–1245)
WBC: 8.6 10*3/uL (ref 3.4–10.8)

## 2023-10-16 LAB — TSH: TSH: 1.54 u[IU]/mL (ref 0.450–4.500)

## 2023-10-19 ENCOUNTER — Encounter: Payer: Self-pay | Admitting: Family Medicine

## 2023-10-19 NOTE — Progress Notes (Signed)
 Mild changes on CBC. Not concerning at this time, may be related to smoking history. Decreased protein and albumin, recommend patient increase protein in diet. Follow up with PCP

## 2023-10-22 DIAGNOSIS — M542 Cervicalgia: Secondary | ICD-10-CM | POA: Diagnosis not present

## 2023-10-22 DIAGNOSIS — Z79899 Other long term (current) drug therapy: Secondary | ICD-10-CM | POA: Diagnosis not present

## 2023-10-22 DIAGNOSIS — Z9181 History of falling: Secondary | ICD-10-CM | POA: Diagnosis not present

## 2023-10-22 DIAGNOSIS — J449 Chronic obstructive pulmonary disease, unspecified: Secondary | ICD-10-CM | POA: Diagnosis not present

## 2023-10-22 DIAGNOSIS — M545 Low back pain, unspecified: Secondary | ICD-10-CM | POA: Diagnosis not present

## 2023-10-26 DIAGNOSIS — J449 Chronic obstructive pulmonary disease, unspecified: Secondary | ICD-10-CM | POA: Diagnosis not present

## 2023-10-29 ENCOUNTER — Encounter: Payer: Self-pay | Admitting: Family Medicine

## 2023-10-29 NOTE — Progress Notes (Signed)
 Mild arthritis on exam. Recommend patient consider PT and follow up with PCP. Recommend that patient continue stretching and strengthening exercises.

## 2023-11-11 DIAGNOSIS — M4802 Spinal stenosis, cervical region: Secondary | ICD-10-CM | POA: Diagnosis not present

## 2023-11-11 DIAGNOSIS — G629 Polyneuropathy, unspecified: Secondary | ICD-10-CM | POA: Diagnosis not present

## 2023-11-11 DIAGNOSIS — Z79899 Other long term (current) drug therapy: Secondary | ICD-10-CM | POA: Diagnosis not present

## 2023-11-11 DIAGNOSIS — G894 Chronic pain syndrome: Secondary | ICD-10-CM | POA: Diagnosis not present

## 2023-11-11 DIAGNOSIS — E119 Type 2 diabetes mellitus without complications: Secondary | ICD-10-CM | POA: Diagnosis not present

## 2023-11-13 DIAGNOSIS — J9601 Acute respiratory failure with hypoxia: Secondary | ICD-10-CM | POA: Diagnosis not present

## 2023-11-13 DIAGNOSIS — J449 Chronic obstructive pulmonary disease, unspecified: Secondary | ICD-10-CM | POA: Diagnosis not present

## 2023-11-17 ENCOUNTER — Encounter: Payer: Self-pay | Admitting: *Deleted

## 2023-11-17 ENCOUNTER — Other Ambulatory Visit: Payer: Self-pay

## 2023-11-17 ENCOUNTER — Other Ambulatory Visit: Payer: Self-pay | Admitting: *Deleted

## 2023-11-17 DIAGNOSIS — Z79899 Other long term (current) drug therapy: Secondary | ICD-10-CM | POA: Diagnosis not present

## 2023-11-17 NOTE — Patient Instructions (Signed)
 Visit Information  Margaret Vaughn was given information about Medicaid Managed Care team care coordination services as a part of their [employer] Medicaid benefit. [name] did not consent to engagement with the [facility] Managed Care team.   If you are experiencing a medical emergency, please call 911 or report to your local emergency department or urgent care.   If you have a non-emergency medical problem during routine business hours, please contact your provider's office and ask to speak with a nurse.   For questions related to your Healthy [facility] health plan, please call: [phone] or visit the homepage here: [email or web address]  If you would like to schedule transportation through your Healthy [facility] plan, please call the following number at least 2 days in advance of your appointment: [phone]  For information about your ride after you set it up, call Ride Assist at [phone]. Use this number to activate a Will Call pickup, or if your transportation is late for a scheduled pickup. Use this number, too, if you need to make a change or cancel a previously scheduled reservation.  If you need transportation services right away, call [phone]. The after-hours call center is staffed 24 hours to handle ride assistance and urgent reservation requests (including discharges) 365 days a year. Urgent trips include sick visits, hospital discharge requests and life-sustaining treatment.  Call the [facility] Line at [phone], at any time, 24 hours a day, 7 days a week. If you are in danger or need immediate medical attention call 911.  If you would like help to quit smoking, call 1-800-QUIT-NOW (248-632-1816) OR Espaol: 1-855-Djelo-Ya (8-756-433-2951) o para ms informacin haga clic aqu or Text READY to 884-166 to register via text  Margaret Vaughn - following are the goals we discussed in your visit today:   Goals  Addressed   None     Please see education materials related to COPD provided by MyChart link.  Patient verbalizes understanding of instructions and care plan provided today and agrees to view in MyChart. Active MyChart status and patient understanding of how to access instructions and care plan via MyChart confirmed with patient.     No further follow up required: patient declines to continue with complex care management  Grandville Lax, BSN RN [facility], [facility] RN Care Manager Direct Dial: [phone]  Fax: [phone]   COPD Action Plan A COPD action plan is a description of what to do when you have a flare (exacerbation) of chronic obstructive pulmonary disease (COPD). Your action plan is a color-coded plan that lists the symptoms that indicate whether your condition is under control and what actions to take. If you have symptoms in the green zone, it means you are doing well that day. If you have symptoms in the yellow zone, it means you are having a bad day or an exacerbation. If you have symptoms in the red zone, you need urgent medical care. Follow the plan that you and your health care provider developed. Review your plan with your health care provider at each visit. Red zone Symptoms in this zone mean that you should get medical help right away. They include: Feeling very short of breath, even when you are resting. Not being able to do any activities because of poor breathing. Not being able to sleep because of poor breathing. Fever or shaking chills. Feeling confused or very sleepy. Chest pain. Coughing up blood. If you have any of these symptoms, call emergency services (  911 in the [place] or go to the nearest emergency room. Yellow zone Symptoms in this zone mean that your condition may be getting worse. They include: Feeling more short of breath than usual. Having less energy for daily activities than usual. Phlegm or mucus  that is thicker than usual. Needing to use your rescue inhaler or nebulizer more often than usual. More ankle swelling than usual. Coughing more than usual. Feeling like you have a chest cold. Trouble sleeping due to COPD symptoms. Decreased appetite. COPD medicines not helping as much as usual. If you experience any "yellow" symptoms: Keep taking your daily medicines as directed. Use your quick-relief inhaler as told by your health care provider. If you were prescribed steroid medicine to take by mouth (oral medicine), start taking it as told by your health care provider. If you were prescribed an antibiotic medicine, start taking it as told by your health care provider. Do not stop taking the antibiotic even if you start to feel better. Use oxygen  as told by your health care provider. Get more rest. Do your pursed-lip breathing exercises. Do not smoke. Avoid any irritants in the air. If your signs and symptoms do not improve after taking these steps, call your health care provider right away. Green zone Symptoms in this zone mean that you are doing well. They include: Being able to do your usual activities and exercise. Having the usual amount of coughing, including the same amount of phlegm or mucus. Being able to sleep well. Having a good appetite. Where to find more information: You can find more information about COPD from: American Lung Association, My COPD Action Plan: www.lung.org COPD Foundation: www.copdfoundation.org National Heart, Lung, & Blood Institute: PopSteam.is Follow these instructions at home: Continue taking your daily medicines as told by your health care provider. Make sure you receive all the immunizations that your health care provider recommends, especially the pneumococcal and influenza vaccines. Wash your hands often with soap and water . Have family members wash their hands too. Regular hand washing can help prevent infections. Follow your usual  exercise and diet plan. Avoid irritants in the air, such as smoke. Do not use any products that contain nicotine  or tobacco. These products include cigarettes, chewing tobacco, and vaping devices, such as e-cigarettes. If you need help quitting, ask your health care provider. Summary A COPD action plan tells you what to do when you have a flare (exacerbation) of chronic obstructive pulmonary disease (COPD). Follow each action plan for your symptoms. If you have any symptoms in the red zone, call emergency services (911 in the [place] or go to the nearest emergency room. This information is not intended to replace advice given to you by your health care provider. Make sure you discuss any questions you have with your health care provider. Document Revised: 05/27/2023 Document Reviewed: 05/27/2023 Elsevier Patient Education  2024 [employer].  Following is a copy of your plan of care:  There are no care plans that you recently modified to display for this patient.

## 2023-11-17 NOTE — Patient Outreach (Signed)
 Complex Care Management   Visit Note  11/17/2023  Name:  Margaret Vaughn MRN: [id number] DOB: [birth year]  Situation: Referral received for Complex Care Management related to COPD I obtained verbal consent from Patient.  Visit completed with patient  on the phone  Background:   Past Medical History:  Diagnosis Date   Acid reflux    Allergies    Anemia    Asthma    Bronchitis    Cervical stenosis of spine    Chronic pain syndrome    Complication of anesthesia    difficulty waking up from anesthesia   COPD (chronic obstructive pulmonary disease) (HCC)    DDD (degenerative disc disease), lumbar    Depression with anxiety    Diabetes mellitus without complication (HCC)    Dyspnea    Dysrhythmia    PSVT   Fatigue    H/O blood clots    H/O echocardiogram 1989   Per pt, done @ [facility]- Showed mitral valve prolapse   Headache(784.0)    Heart murmur    Hemorrhoids, external    History of hiatal hernia    History of kidney stones    History of PSVT (paroxysmal supraventricular tachycardia)    HTN (hypertension)    Hyperlipidemia    Hypoxemia    Insomnia    Memory loss    Myalgia and myositis    Narcolepsy    On home O2    2-4 L @ HS   OSA (obstructive sleep apnea)    Pneumonia    Sleeping difficulty    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HCC)    Thoracalgia    Tobacco abuse    Tremor    Ulcer disease     Assessment: Patient Reported Symptoms:  Cognitive Cognitive Status: Alert and oriented to person, place, and time Cognitive/Intellectual Conditions Management [RPT]: None reported or documented in medical history or problem list   Health Maintenance Behaviors: Annual physical exam Health Facilitated by: Rest  Neurological Neurological Review of Symptoms: Headaches, Weakness Neurological Management Strategies: Routine screening, Medication therapy Neurological Self-Management Outcome: 4 (good)  HEENT HEENT Symptoms Reported: Not assessed      Cardiovascular  Cardiovascular Symptoms Reported: Dizziness Does patient have uncontrolled Hypertension?: No Cardiovascular Self-Management Outcome: 3 (uncertain)  Respiratory Respiratory Symptoms Reported: No symptoms reported Respiratory Conditions: COPD Respiratory Self-Management Outcome: 4 (good)  Endocrine Patient reports the following symptoms related to hypoglycemia or hyperglycemia : No symptoms reported Endocrine Self-Management Outcome: 4 (good)  Gastrointestinal Gastrointestinal Symptoms Reported: Not assessed      Genitourinary Genitourinary Symptoms Reported: Not assessed    Integumentary Integumentary Symptoms Reported: Not assessed    Musculoskeletal Musculoskelatal Symptoms Reviewed: Not assessed        Psychosocial Psychosocial Symptoms Reported: Not assessed     Quality of Family Relationships: unable to assess Do you feel physically threatened by others?: No      09/17/2023    3:26 PM  Depression screen PHQ 2/9  Decreased Interest 0  Down, Depressed, Hopeless 1  PHQ - 2 Score 1    Vitals:   11/15/23 1428  BP: 109/77    Medications Reviewed Today     Reviewed by Remona Carmel, RN (Registered Nurse) on 11/17/23 at 1430  Med List Status: <None>   Medication Order Taking? Sig Documenting Provider Last Dose Status Informant  albuterol  (PROVENTIL ) (2.5 MG/3ML) 0.083% nebulizer solution [id number] No Take 3 mLs (2.5 mg total) by nebulization every 6 (six) hours as needed for wheezing or shortness  of breath. Zelaya, Oscar A, PA-C Taking Active   albuterol  (VENTOLIN  HFA) 108 (90 Base) MCG/ACT inhaler [id number] No INHALE 2 PUFFS BY MOUTH EVERY 6 HOURS AS NEEDED FOR WHEEZE OR SHORTNESS OF BREATH Dettinger, Lucio Sabin, MD Taking Active Self  AMITIZA  24 MCG capsule [id number] No TAKE 1 CAPSULE (24 MCG TOTAL) BY MOUTH 2 (TWO) TIMES DAILY WITH A MEAL. [name], NP Taking Active Self           Med Note (ROBB, MELANIE A   Mon Aug 23, 2023  2:09 PM) Taking as needed   aspirin EC 81 MG tablet [id number] No Take 81 mg by mouth daily. Swallow whole. [provider] Taking Active Self           Med Note (ROBB, MELANIE A   Wed Oct 13, 2023 11:44 AM) Taking 325mg   atorvastatin  (LIPITOR) 40 MG tablet [id number] No Take 1 tablet (40 mg total) by mouth daily. Dettinger, Lucio Sabin, MD Taking Active Self  b complex vitamins capsule [id number] No Take 1 capsule by mouth daily. [provider] Taking Active   baclofen  (LIORESAL ) 10 MG tablet [id number] No Take 10 mg by mouth 3 (three) times daily as needed for muscle spasms. [provider] Taking Active Self  BELBUCA 300 MCG FILM [id number] No Place 300 mcg inside cheek 2 (two) times daily as needed (pain). [provider] Taking Active Self           Med Note (ROBB, MELANIE A   Wed Oct 13, 2023 11:49 AM) Taking 600mcg twice daily as needed  benzonatate  (TESSALON ) 200 MG capsule [id number] No Take 1 capsule (200 mg total) by mouth 3 (three) times daily as needed for cough. Dettinger, Lucio Sabin, MD Taking Active Self  budesonide -formoterol  (SYMBICORT ) 160-4.5 MCG/ACT inhaler [id number] No Inhale 2 puffs into the lungs 2 (two) times daily. Hunsucker, Archer Kobs, MD Taking Active   Cyanocobalamin  (VITAMIN B-12 PO) [id number] No Take by mouth. [provider] Taking Active   DULoxetine  (CYMBALTA ) 60 MG capsule [id number] No TAKE 1 CAPSULE (60 MG TOTAL) BY MOUTH DAILY. Dettinger, Lucio Sabin, MD Taking Active   enalapril  (VASOTEC ) 5 MG tablet [id number] No Take 1 tablet (5 mg total) by mouth daily. Dettinger, Lucio Sabin, MD Taking Active Self  ferrous sulfate 325 (65 FE) MG tablet [id number] No Take 325 mg by mouth 2 (two) times daily with a meal. [provider] Taking Active   Multiple Vitamins-Minerals (HAIR SKIN NAILS PO) [id number] No Take 3 tablets by mouth daily. [provider] Taking Active Self  Multiple Vitamins-Minerals (MULTIVITAMIN WITH MINERALS) tablet [id number] No Take 1  tablet by mouth daily. [provider] Taking Active Self  naloxone  (NARCAN ) nasal spray 4 mg/0.1 mL [id number] No Place 1 spray into the nose once. [provider] Taking Active Self           Med Note ([name], Wheeler Hammonds Aug 12, 2023  7:29 PM) Never used, has at home  nicotine  (NICODERM CQ  - DOSED IN MG/24 HOURS) 21 mg/24hr patch [id number] No Place 21 mg onto the skin daily as needed (smoking cessation). [provider] Taking Active Self  nystatin  (MYCOSTATIN ) 100000 UNIT/ML suspension [id number] No Take 5 mLs by mouth 4 (four) times daily as needed (thrush). [provider] Taking Active Self  ondansetron  (ZOFRAN ) 4 MG tablet [id number] No Take 1 tablet (4 mg total) by mouth as needed for nausea or vomiting. Take one 1 tablet every 8  hours as needed Dettinger, Lucio Sabin, MD Taking Active   ondansetron  (ZOFRAN -ODT) 4 MG disintegrating tablet [id number] No 4mg  ODT q4 hours prn nausea/vomit Pollina, Marine Sia, MD Taking Active   oxyCODONE  (ROXICODONE ) 15 MG immediate release tablet [id number] No Take 15 mg by mouth 4 (four) times daily as needed for pain. [provider] Taking Active Self           Med Note (ROBB, MELANIE A   Wed Oct 13, 2023 11:52 AM) Taking 10 mg   pregabalin  (LYRICA ) 150 MG capsule [id number] No Take 1 capsule (150 mg total) by mouth 3 (three) times daily. Dettinger, Lucio Sabin, MD Taking Active Self  Pyridoxine HCl (VITAMIN B-6 PO) [id number] No Take by mouth. [provider] Taking Active   sitaGLIPtin -metformin  (JANUMET ) 50-1000 MG tablet [id number] No TAKE 1 TABLET BY MOUTH TWICE A DAY WITH FOOD Dettinger, Lucio Sabin, MD Taking Active Self  Tiotropium Bromide  Monohydrate (SPIRIVA  RESPIMAT) 2.5 MCG/ACT AERS [id number] No Inhale 2 puffs into the lungs daily. Hunsucker, Archer Kobs, MD Taking Active   zolpidem (AMBIEN CR) 6.25 MG CR tablet [id number] No Take 6.25 mg by mouth at bedtime. [provider] Taking Active Self            Med Note ([name], Wheeler Hammonds Aug 12, 2023  6:34 PM) Pt requests refill            Recommendation:   PCP Follow-up  Follow Up Plan:   Closing From:  Complex Care Management per patient request  Grandville Lax, BSN RN [facility]  [place], [facility] RN Care Manager Direct Dial: [phone]  Fax: [phone]

## 2023-11-22 ENCOUNTER — Ambulatory Visit: Payer: Medicaid Other | Admitting: Pulmonary Disease

## 2023-11-22 ENCOUNTER — Encounter: Payer: Self-pay | Admitting: Pulmonary Disease

## 2023-11-22 VITALS — BP 128/74 | HR 98 | Ht 64.0 in | Wt 137.0 lb

## 2023-11-22 DIAGNOSIS — J9611 Chronic respiratory failure with hypoxia: Secondary | ICD-10-CM

## 2023-11-22 NOTE — Progress Notes (Unsigned)
 @Patient  ID: Margaret Vaughn, female    DOB: 07/15/1961, 63 y.o.   MRN: [id number]  Chief Complaint  Patient presents with   Follow-up    Breathing is slightly better since the last visit.     Referring provider: Dettinger, Lucio Sabin, MD  HPI:   63 y.o. woman whom are seen in follow up for dyspnea exertion and COPD.  Most recent PCP note reviewed.  Returns for routine follow-up.  Follow-up as overdue last seen May 2023.  Maintained on high-dose Symbicort  and Spiriva  via Medicaid formulary.  Doing okay.  Stable oxygen  of 3 L POC.  Issues with oxygen  concentrator turning off and plugging.  Has no recourse for this when she is out and about cannot recharge etc.  We discussed her allergies to troubleshoot this today.  Will try the orders for small tanks, dubious ocular code we will fill this but can try.  Recently contracted RSV.  Lingering symptoms for many weeks particularly increased cough and congestion.  Prednisone  helped some.  But wears off.  Additional prednisone  taper today.  HPI at initial visit: Patient was diagnosed with COPD sometime ago.  PFTs performed at pulmonary doctor office in [facility].  Reportedly no medication was prescribed to her given she was still smoking despite her symptoms as well as her diagnosis of COPD on PFTs.  Seen by PCP recently.  Started on inhalers.  Referred here for further evaluation.  She is on low-dose Wixela as well as Spiriva  daily.  She does think the medication helps.  Dyspnea bit better.  Her chronic issues include dyspnea on exertion.  Worse on inclines or stairs.  Gradually getting worse.  No time of day when things are better or worse.  No position that makes things better or worse.  No seasonal environmental factors to make things better or worse.  She also has a cough.  Chronic.  Daily.  For years.  Productive of sputum.  Sometimes worse in the mornings.  Again the inhaler seem to be helping some with this.  She has been on oxygen .  Using 2  L.  She has chronic neurologic issues, weakness.  Due to cervical pathology.  Difficult to manipulate small tanks.  Unable to get POC through current oxygen  provider.  Walk today.  Qualify for POC.  New order placed to different DME company.  PMH: Tobacco abuse, Hyperlipidemia, GERD, hypertension, migraines Surgical history: Hysterectomy, anterior cervical decompression, appendectomy, hernia repair, tubal ligation Family history: Mother with uterine cancer, CAD, hyperlipidemia Social history: Current smoker, 60-pack-year, trying to reduce.  Quit for 2 months 12 years ago.  Then resumed.  Lives in [place]    Questionaires / Pulmonary Flowsheets:   ACT:      No data to display          MMRC:     No data to display          Epworth:      No data to display          Tests:   FENO:  No results found for: "NITRICOXIDE"  PFT:     No data to display          WALK:      No data to display          Imaging: Personally reviewed, most recent chest x-ray 2021 clear on my review interpretation No results found.   Lab Results: Personally reviewed CBC    Component Value Date/Time  WBC 8.6 10/15/2023 1553   WBC 10.1 09/10/2023 1837   RBC 3.94 10/15/2023 1553   RBC 3.06 (L) 09/10/2023 1837   HGB 11.4 10/15/2023 1553   HCT 36.6 10/15/2023 1553   PLT 411 10/15/2023 1553   MCV 93 10/15/2023 1553   MCH 28.9 10/15/2023 1553   MCH 30.1 09/10/2023 1837   MCHC 31.1 (L) 10/15/2023 1553   MCHC 31.0 09/10/2023 1837   RDW 15.7 (H) 10/15/2023 1553   LYMPHSABS 1.4 10/15/2023 1553   MONOABS 0.8 08/12/2023 1805   EOSABS 0.3 10/15/2023 1553   BASOSABS 0.0 10/15/2023 1553    BMET    Component Value Date/Time   NA 142 10/15/2023 1553   K 4.2 10/15/2023 1553   CL 99 10/15/2023 1553   CO2 31 (H) 10/15/2023 1553   GLUCOSE 89 10/15/2023 1553   GLUCOSE 113 (H) 09/10/2023 1837   BUN 15 10/15/2023 1553   CREATININE 0.67 10/15/2023 1553   CALCIUM  9.3  10/15/2023 1553   GFRNONAA >60 09/10/2023 1837   GFRAA 112 03/13/2020 1523    BNP    Component Value Date/Time   BNP 67.0 09/10/2023 1837    ProBNP No results found for: "PROBNP"  Specialty Problems       Pulmonary Problems   OSA (obstructive sleep apnea)   IMPRESSION: patient on BiPAP but still with gasping/choking and axiety with machine.  +sleep attacks.  11-27-08 BiPAP good on 8/4 with desats independent of apnea.  Patient apparently was on a higher pressure before (?20/17?) since going down off this pressure, swelling in the legs is better, more energy and less pain.  06/27/2009 increase BiPAP to 12/6.      COPD (chronic obstructive pulmonary disease) (HCC)   Chronic respiratory failure with hypoxia (HCC)    Allergies  Allergen Reactions   Avelox [Moxifloxacin] Anaphylaxis   Iohexol  Anaphylaxis    Pt given 300 for CT Angio was given 50 mg benadryl  prior to scan had no problems. Anaphylaxis?   Shellfish Allergy Hives    Immunization History  Administered Date(s) Administered   Hepatitis B 05/11/1991, 06/08/1991, 11/08/1991   Influenza, Seasonal, Injecte, Preservative Fre 04/21/2023   Influenza,inj,Quad PF,6+ Mos 06/26/2020, 06/26/2022   MMR 08/30/1990   PFIZER(Purple Top)SARS-COV-2 Vaccination 11/08/2019   Tdap 08/22/2015, 09/25/2022, 09/07/2023   Zoster Recombinant(Shingrix ) 12/18/2021, 03/20/2022    Past Medical History:  Diagnosis Date   Acid reflux    Allergies    Anemia    Asthma    Bronchitis    Cervical stenosis of spine    Chronic pain syndrome    Complication of anesthesia    difficulty waking up from anesthesia   COPD (chronic obstructive pulmonary disease) (HCC)    DDD (degenerative disc disease), lumbar    Depression with anxiety    Diabetes mellitus without complication (HCC)    Dyspnea    Dysrhythmia    PSVT   Fatigue    H/O blood clots    H/O echocardiogram 1989   Per pt, done @ [facility]- Showed mitral valve prolapse    Headache(784.0)    Heart murmur    Hemorrhoids, external    History of hiatal hernia    History of kidney stones    History of PSVT (paroxysmal supraventricular tachycardia)    HTN (hypertension)    Hyperlipidemia    Hypoxemia    Insomnia    Memory loss    Myalgia and myositis    Narcolepsy    On home O2  2-4 L @ HS   OSA (obstructive sleep apnea)    Pneumonia    Sleeping difficulty    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HCC)    Thoracalgia    Tobacco abuse    Tremor    Ulcer disease     Tobacco History: Social History   Tobacco Use  Smoking Status Every Day   Current packs/day: 2.00   Average packs/day: 2.0 packs/day for 30.0 years (60.0 ttl pk-yrs)   Types: Cigarettes  Smokeless Tobacco Never  Tobacco Comments   Pt states she is down to 1 ppd. 06/22/2022    Ready to quit: Not Answered Counseling given: Not Answered Tobacco comments: Pt states she is down to 1 ppd. 06/22/2022    Continue to not smoke  Outpatient Encounter Medications as of 11/22/2023  Medication Sig   albuterol  (PROVENTIL ) (2.5 MG/3ML) 0.083% nebulizer solution Take 3 mLs (2.5 mg total) by nebulization every 6 (six) hours as needed for wheezing or shortness of breath.   albuterol  (VENTOLIN  HFA) 108 (90 Base) MCG/ACT inhaler INHALE 2 PUFFS BY MOUTH EVERY 6 HOURS AS NEEDED FOR WHEEZE OR SHORTNESS OF BREATH   AMITIZA  24 MCG capsule TAKE 1 CAPSULE (24 MCG TOTAL) BY MOUTH 2 (TWO) TIMES DAILY WITH A MEAL.   aspirin EC 81 MG tablet Take 81 mg by mouth daily. Swallow whole.   atorvastatin  (LIPITOR) 40 MG tablet Take 1 tablet (40 mg total) by mouth daily.   b complex vitamins capsule Take 1 capsule by mouth daily.   baclofen  (LIORESAL ) 10 MG tablet Take 10 mg by mouth 3 (three) times daily as needed for muscle spasms.   BELBUCA 300 MCG FILM Place 300 mcg inside cheek 2 (two) times daily as needed (pain).   benzonatate  (TESSALON ) 200 MG capsule Take 1 capsule (200 mg total) by mouth 3 (three) times daily as needed for cough.    budesonide -formoterol  (SYMBICORT ) 160-4.5 MCG/ACT inhaler Inhale 2 puffs into the lungs 2 (two) times daily.   Cyanocobalamin  (VITAMIN B-12 PO) Take by mouth.   DULoxetine  (CYMBALTA ) 60 MG capsule TAKE 1 CAPSULE (60 MG TOTAL) BY MOUTH DAILY.   enalapril  (VASOTEC ) 5 MG tablet Take 1 tablet (5 mg total) by mouth daily.   ferrous sulfate 325 (65 FE) MG tablet Take 325 mg by mouth 2 (two) times daily with a meal.   Multiple Vitamins-Minerals (HAIR SKIN NAILS PO) Take 3 tablets by mouth daily.   Multiple Vitamins-Minerals (MULTIVITAMIN WITH MINERALS) tablet Take 1 tablet by mouth daily.   naloxone  (NARCAN ) nasal spray 4 mg/0.1 mL Place 1 spray into the nose once.   nystatin  (MYCOSTATIN ) 100000 UNIT/ML suspension Take 5 mLs by mouth 4 (four) times daily as needed (thrush).   ondansetron  (ZOFRAN ) 4 MG tablet Take 1 tablet (4 mg total) by mouth as needed for nausea or vomiting. Take one 1 tablet every 8 hours as needed   ondansetron  (ZOFRAN -ODT) 4 MG disintegrating tablet 4mg  ODT q4 hours prn nausea/vomit   oxyCODONE  (ROXICODONE ) 15 MG immediate release tablet Take 15 mg by mouth 4 (four) times daily as needed for pain.   pregabalin  (LYRICA ) 150 MG capsule Take 1 capsule (150 mg total) by mouth 3 (three) times daily.   Pyridoxine HCl (VITAMIN B-6 PO) Take by mouth.   sitaGLIPtin -metformin  (JANUMET ) 50-1000 MG tablet TAKE 1 TABLET BY MOUTH TWICE A DAY WITH FOOD   Tiotropium Bromide  Monohydrate (SPIRIVA  RESPIMAT) 2.5 MCG/ACT AERS Inhale 2 puffs into the lungs daily.   [DISCONTINUED] nicotine  (NICODERM CQ  - DOSED IN  MG/24 HOURS) 21 mg/24hr patch Place 21 mg onto the skin daily as needed (smoking cessation).   [DISCONTINUED] zolpidem (AMBIEN CR) 6.25 MG CR tablet Take 6.25 mg by mouth at bedtime.   No facility-administered encounter medications on file as of 11/22/2023.     Review of Systems  Review of Systems  N/a Physical Exam  BP 128/74   Pulse 98   Ht 5\' 4"  (1.626 m)   Wt 137 lb (62.1 kg)    SpO2 97%   BMI 23.52 kg/m   Wt Readings from Last 5 Encounters:  11/22/23 137 lb (62.1 kg)  10/15/23 138 lb (62.6 kg)  09/17/23 144 lb (65.3 kg)  09/10/23 138 lb 14.2 oz (63 kg)  09/07/23 139 lb 12.8 oz (63.4 kg)    BMI Readings from Last 5 Encounters:  11/22/23 23.52 kg/m  10/15/23 23.69 kg/m  09/17/23 24.72 kg/m  09/10/23 23.84 kg/m  09/07/23 24.00 kg/m     Physical Exam General: Sitting in chair, no acute distress Eyes: EOMI, icterus Neck: Supple, no JVP Pulmonary: Distant with expiratory wheeze low pitched, clear Cardiovascular: Warm, no edema Abdomen: Nondistended, bowel sounds present MSK: No synovitis, joint effusion Neuro: Normal gait, no weakness Psych: Normal mood, full affect   Assessment & Plan:   Dyspnea on exertion: Likely multifactorial largely given by underlying COPD due to cigarette smoking.  Likely contribution from deconditioning given decreased activity over time.  Improved with alteration of COPD therapies.  COPD with subacute exacerbation due to RSV infection: Suspect severe based on description of her PFTs.  I cannot review these.  Improvement overall with switch from DPI to HFA ICS/LABA, continue high-dose Symbicort , Symbicort  Respimat.  Recent decompensation worsening cough with RSV infection likely the last many more weeks.  Prednisone  taper today.  Polycythemia: Likely due to chronic hypoxemia.  Stressed importance of oxygen  usage at all times with exertion. Improved over time.  Chronic hypoxemic respiratory failure: Continue oxygen  supplementation POC device.  Insomnia: Discussed case with Dr. Magda Schneider in the past..  Recommended ramelteon .  This was prescribed in the past.  Seems like it is NOT helping.  Severe sleep apnea/chronic hypercapnia: Historically BiPAP therapy.  BiPAP machine not working well 2023 but deferred additional workup until sleeping better.  She still cannot sleep.  Likely would benefit from a referral to a sleep  doctor once improved from RSV standpoint.  No follow-ups on file.   Guerry Leek, MD 11/22/2023  I spent 41 minutes in care of patient including review of records, face-to-face visit, coordination of care.

## 2023-11-22 NOTE — Patient Instructions (Signed)
 No changes to the inhalers etc., seem to be doing okay for the lungs  Return to clinic in 6 months or sooner as needed with Dr. Marygrace Snellen

## 2023-11-25 ENCOUNTER — Other Ambulatory Visit: Payer: Self-pay | Admitting: Family Medicine

## 2023-11-25 DIAGNOSIS — F411 Generalized anxiety disorder: Secondary | ICD-10-CM

## 2023-11-25 DIAGNOSIS — M797 Fibromyalgia: Secondary | ICD-10-CM

## 2023-11-25 DIAGNOSIS — J449 Chronic obstructive pulmonary disease, unspecified: Secondary | ICD-10-CM | POA: Diagnosis not present

## 2023-11-25 DIAGNOSIS — G950 Syringomyelia and syringobulbia: Secondary | ICD-10-CM

## 2023-12-10 DIAGNOSIS — G629 Polyneuropathy, unspecified: Secondary | ICD-10-CM | POA: Diagnosis not present

## 2023-12-10 DIAGNOSIS — E119 Type 2 diabetes mellitus without complications: Secondary | ICD-10-CM | POA: Diagnosis not present

## 2023-12-10 DIAGNOSIS — G894 Chronic pain syndrome: Secondary | ICD-10-CM | POA: Diagnosis not present

## 2023-12-10 DIAGNOSIS — Z79899 Other long term (current) drug therapy: Secondary | ICD-10-CM | POA: Diagnosis not present

## 2023-12-10 DIAGNOSIS — R030 Elevated blood-pressure reading, without diagnosis of hypertension: Secondary | ICD-10-CM | POA: Diagnosis not present

## 2023-12-10 DIAGNOSIS — M4802 Spinal stenosis, cervical region: Secondary | ICD-10-CM | POA: Diagnosis not present

## 2023-12-10 DIAGNOSIS — F17210 Nicotine dependence, cigarettes, uncomplicated: Secondary | ICD-10-CM | POA: Diagnosis not present

## 2023-12-13 DIAGNOSIS — J449 Chronic obstructive pulmonary disease, unspecified: Secondary | ICD-10-CM | POA: Diagnosis not present

## 2023-12-13 DIAGNOSIS — J9601 Acute respiratory failure with hypoxia: Secondary | ICD-10-CM | POA: Diagnosis not present

## 2023-12-15 DIAGNOSIS — Z79899 Other long term (current) drug therapy: Secondary | ICD-10-CM | POA: Diagnosis not present

## 2023-12-23 ENCOUNTER — Encounter: Payer: Self-pay | Admitting: Family Medicine

## 2023-12-23 ENCOUNTER — Other Ambulatory Visit: Payer: Self-pay | Admitting: Family Medicine

## 2023-12-23 DIAGNOSIS — M797 Fibromyalgia: Secondary | ICD-10-CM

## 2023-12-23 DIAGNOSIS — F411 Generalized anxiety disorder: Secondary | ICD-10-CM

## 2023-12-23 DIAGNOSIS — G950 Syringomyelia and syringobulbia: Secondary | ICD-10-CM

## 2023-12-23 NOTE — Telephone Encounter (Signed)
Left message to schedule appt and will mail letter

## 2023-12-23 NOTE — Telephone Encounter (Signed)
 Dettinger NTBS 30-d given 11/26/23

## 2023-12-26 DIAGNOSIS — J449 Chronic obstructive pulmonary disease, unspecified: Secondary | ICD-10-CM | POA: Diagnosis not present

## 2023-12-27 ENCOUNTER — Telehealth: Payer: Self-pay | Admitting: Family Medicine

## 2023-12-27 ENCOUNTER — Other Ambulatory Visit: Payer: Self-pay

## 2023-12-27 DIAGNOSIS — G950 Syringomyelia and syringobulbia: Secondary | ICD-10-CM

## 2023-12-27 DIAGNOSIS — F411 Generalized anxiety disorder: Secondary | ICD-10-CM

## 2023-12-27 DIAGNOSIS — M797 Fibromyalgia: Secondary | ICD-10-CM

## 2023-12-27 MED ORDER — DULOXETINE HCL 60 MG PO CPEP: 60.0000 mg | ORAL_CAPSULE | Freq: Every day | ORAL | 0 refills | Status: DC

## 2023-12-27 NOTE — Telephone Encounter (Signed)
 Copied from CRM [id number]. Topic: Appointments - Appointment Scheduling >> Dec 27, 2023 10:21 AM Baldemar Lev wrote: Pt wants to know if they will be able to fill their cymbalta  seeing a provider other than their PCP. Their PCP is not available until July and patient has appt with Connell Degree this Thursday. Please advise if she needs to see PCP instead

## 2023-12-27 NOTE — Telephone Encounter (Signed)
 Attempted to call patient to let them know that their Cymbalta  refill has be sent to the pharmacy. Voicemail not set up. Sent [employer].

## 2023-12-27 NOTE — Telephone Encounter (Signed)
 Go ahead and bridge her with enough Cymbalta  till she comes back in July

## 2023-12-30 ENCOUNTER — Encounter: Payer: Self-pay | Admitting: Family Medicine

## 2023-12-30 ENCOUNTER — Ambulatory Visit: Admitting: Family Medicine

## 2023-12-30 VITALS — BP 135/79 | HR 110 | Temp 97.8°F | Ht 64.0 in | Wt 133.0 lb

## 2023-12-30 DIAGNOSIS — Z9109 Other allergy status, other than to drugs and biological substances: Secondary | ICD-10-CM

## 2023-12-30 DIAGNOSIS — F411 Generalized anxiety disorder: Secondary | ICD-10-CM | POA: Diagnosis not present

## 2023-12-30 DIAGNOSIS — J9611 Chronic respiratory failure with hypoxia: Secondary | ICD-10-CM | POA: Diagnosis not present

## 2023-12-30 DIAGNOSIS — L249 Irritant contact dermatitis, unspecified cause: Secondary | ICD-10-CM

## 2023-12-30 MED ORDER — BUSPIRONE HCL 5 MG PO TABS: 5.0000 mg | ORAL_TABLET | Freq: Two times a day (BID) | ORAL | 0 refills | Status: DC

## 2023-12-30 MED ORDER — LEVOCETIRIZINE DIHYDROCHLORIDE 5 MG PO TABS: 5.0000 mg | ORAL_TABLET | Freq: Every evening | ORAL | 0 refills | Status: DC

## 2023-12-30 MED ORDER — FAMOTIDINE 20 MG PO TABS: 20.0000 mg | ORAL_TABLET | Freq: Two times a day (BID) | ORAL | 0 refills | Status: DC

## 2023-12-30 NOTE — Progress Notes (Signed)
 Subjective:  Patient ID: Margaret Vaughn, female    DOB: 02-11-1961, 63 y.o.   MRN: [id number]  Patient Care Team: Dettinger, Lucio Sabin, MD as PCP - General (Family Medicine)   Chief Complaint:  Allergies  HPI: Margaret Vaughn is a 63 y.o. female presenting on 12/30/2023 for Allergies  HPI Patient presents today to discuss allergies.  States that she has had allergies "since I was a baby". Reports that she is taking OTC allergy medications, benadryl  and pseudoephedrine daily. Reports taking benadryl  every 4 hours. Reports that she has eye itching, ear itching, and skin itching. States that she is scratching excessively that causes her to "go raw". She is not currently using lotion or cream. Sometimes has hydrocortisone cream.   Anxiety  States that she is having increased anxiety. States that she is having new fears and her brain "won't turn off". States that she feels on edge and that she will have a panic attack or heart attack and does not know why. Denies thoughts of self harm. States that she has tried counseling in the past. She is taking Cymbalta  for depression, but would like something for anxiety.    Relevant past medical, surgical, family, and social history reviewed and updated as indicated.  Allergies and medications reviewed and updated. Data reviewed: Chart in Epic.   Past Medical History:  Diagnosis Date   Acid reflux    Allergies    Anemia    Asthma    Bronchitis    Cervical stenosis of spine    Chronic pain syndrome    Complication of anesthesia    difficulty waking up from anesthesia   COPD (chronic obstructive pulmonary disease) (HCC)    DDD (degenerative disc disease), lumbar    Depression with anxiety    Diabetes mellitus without complication (HCC)    Dyspnea    Dysrhythmia    PSVT   Fatigue    H/O blood clots    H/O echocardiogram 1989   Per pt, done @ [facility]- Showed mitral valve prolapse   Headache(784.0)    Heart murmur     Hemorrhoids, external    History of hiatal hernia    History of kidney stones    History of PSVT (paroxysmal supraventricular tachycardia)    HTN (hypertension)    Hyperlipidemia    Hypoxemia    Insomnia    Memory loss    Myalgia and myositis    Narcolepsy    On home O2    2-4 L @ HS   OSA (obstructive sleep apnea)    Pneumonia    Sleeping difficulty    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HCC)    Thoracalgia    Tobacco abuse    Tremor    Ulcer disease     Past Surgical History:  Procedure Laterality Date   ABDOMINAL HYSTERECTOMY     ANTERIOR CERVICAL DECOMP/DISCECTOMY FUSION N/A 12/18/2020   Procedure: CERVICAL TWO-THREE ANTERIOR CERVICAL DISCECTOMY/DECOMPRESSION FUSION;  Surgeon: Gearl Keens, MD;  Location: [facility] OR;  Service: Neurosurgery;  Laterality: N/A;   APPENDECTOMY  1992   BIOPSY  07/01/2022   Procedure: BIOPSY;  Surgeon: Suzette Espy, MD;  [facility];  Service: Endoscopy;;   CHOLECYSTECTOMY  1992   COLONOSCOPY WITH PROPOFOL  N/A 07/01/2022   Procedure: COLONOSCOPY WITH PROPOFOL ;  Surgeon: Suzette Espy, MD;  [facility]: [facility];  Service: Endoscopy;  Laterality: N/A;  12:00 pm   DIAGNOSTIC LAPAROSCOPY  1992   lap chole  ESOPHAGOGASTRODUODENOSCOPY (EGD) WITH PROPOFOL  N/A 07/01/2022   Procedure: ESOPHAGOGASTRODUODENOSCOPY (EGD) WITH PROPOFOL ;  Surgeon: Suzette Espy, MD;  [facility];  Service: Endoscopy;  Laterality: N/A;   HEMOSTASIS CLIP PLACEMENT  07/01/2022   Procedure: HEMOSTASIS CLIP PLACEMENT;  Surgeon: Suzette Espy, MD;  [facility];  Service: Endoscopy;;   HERNIA REPAIR     umbilical... as a child   MALONEY DILATION  07/01/2022   Procedure: MALONEY DILATION;  Surgeon: Suzette Espy, MD;  [facility];  Service: Endoscopy;;   OOPHORECTOMY     POLYPECTOMY  07/01/2022   Procedure: POLYPECTOMY;  Surgeon: Suzette Espy, MD;  [facility];  Service: Endoscopy;;   POSTERIOR CERVICAL FUSION/FORAMINOTOMY N/A  08/07/2020   Procedure: POSTERIOR CERVICAL LAMINECTOMY FOR MYELOPATHY;  Surgeon: Gearl Keens, MD;  Location: [facility] OR;  Service: Neurosurgery;  Laterality: N/A;   POSTERIOR CERVICAL FUSION/FORAMINOTOMY N/A 03/10/2021   Procedure: Posterior cervical fusion with lateral mass fixation Cervical one - cervical four;  Surgeon: Gearl Keens, MD;  Location: [facility] OR;  Service: Neurosurgery;  Laterality: N/A;   POSTERIOR CERVICAL LAMINECTOMY  08/07/2020   w/ Dr. Lamon Pillow   tonsillectomy     TONSILLECTOMY     TUBAL LIGATION     Social History   Socioeconomic History   Marital status: Married    Spouse name: Not on file   Number of children: 5   Years of education: Not on file   Highest education level: Not on file  Occupational History   Occupation: disabled    Employer: UNEMPLOYED  Tobacco Use   Smoking status: Every Day    Current packs/day: 2.00    Average packs/day: 2.0 packs/day for 30.0 years (60.0 ttl pk-yrs)    Types: Cigarettes   Smokeless tobacco: Never   Tobacco comments:    Pt states she is down to 1 ppd. 06/22/2022   Vaping Use   Vaping status: Never Used  Substance and Sexual Activity   Alcohol  use: No   Drug use: No   Sexual activity: Yes    Birth control/protection: Surgical  Other Topics Concern   Not on file  Social History Narrative   Not on file   Social Drivers of Health   Financial Resource Strain: Not on file  Food Insecurity: No Food Insecurity (11/17/2023)   Hunger Vital Sign    Worried About Running Out of Food in the Last Year: Never true    Ran Out of Food in the Last Year: Never true  Transportation Needs: No Transportation Needs (11/17/2023)   PRAPARE - Administrator, Civil Service (Medical): No    Lack of Transportation (Non-Medical): No  Physical Activity: Not on file  Stress: Not on file  Social Connections: Not on file  Intimate Partner Violence: Not At Risk (11/17/2023)   Humiliation, Afraid, Rape, and Kick questionnaire    Fear of Current  or Ex-Partner: No    Emotionally Abused: No    Physically Abused: No    Sexually Abused: No    Outpatient Encounter Medications as of 12/30/2023  Medication Sig   albuterol  (PROVENTIL ) (2.5 MG/3ML) 0.083% nebulizer solution Take 3 mLs (2.5 mg total) by nebulization every 6 (six) hours as needed for wheezing or shortness of breath.   albuterol  (VENTOLIN  HFA) 108 (90 Base) MCG/ACT inhaler INHALE 2 PUFFS BY MOUTH EVERY 6 HOURS AS NEEDED FOR WHEEZE OR SHORTNESS OF BREATH   AMITIZA  24 MCG capsule TAKE 1 CAPSULE (  24 MCG TOTAL) BY MOUTH 2 (TWO) TIMES DAILY WITH A MEAL.   aspirin EC 81 MG tablet Take 81 mg by mouth daily. Swallow whole.   atorvastatin  (LIPITOR) 40 MG tablet Take 1 tablet (40 mg total) by mouth daily.   b complex vitamins capsule Take 1 capsule by mouth daily.   baclofen  (LIORESAL ) 10 MG tablet Take 10 mg by mouth 3 (three) times daily as needed for muscle spasms.   BELBUCA 300 MCG FILM Place 300 mcg inside cheek 2 (two) times daily as needed (pain).   benzonatate  (TESSALON ) 200 MG capsule Take 1 capsule (200 mg total) by mouth 3 (three) times daily as needed for cough.   budesonide -formoterol  (SYMBICORT ) 160-4.5 MCG/ACT inhaler Inhale 2 puffs into the lungs 2 (two) times daily.   Cyanocobalamin  (VITAMIN B-12 PO) Take by mouth.   DULoxetine  (CYMBALTA ) 60 MG capsule Take 1 capsule (60 mg total) by mouth daily. **NEEDS TO BE SEEN BEFORE NEXT REFILL**   enalapril  (VASOTEC ) 5 MG tablet Take 1 tablet (5 mg total) by mouth daily.   ferrous sulfate 325 (65 FE) MG tablet Take 325 mg by mouth 2 (two) times daily with a meal.   Multiple Vitamins-Minerals (HAIR SKIN NAILS PO) Take 3 tablets by mouth daily.   Multiple Vitamins-Minerals (MULTIVITAMIN WITH MINERALS) tablet Take 1 tablet by mouth daily.   naloxone  (NARCAN ) nasal spray 4 mg/0.1 mL Place 1 spray into the nose once.   nystatin  (MYCOSTATIN ) 100000 UNIT/ML suspension Take 5 mLs by mouth 4 (four) times daily as needed (thrush).    ondansetron  (ZOFRAN ) 4 MG tablet Take 1 tablet (4 mg total) by mouth as needed for nausea or vomiting. Take one 1 tablet every 8 hours as needed   ondansetron  (ZOFRAN -ODT) 4 MG disintegrating tablet 4mg  ODT q4 hours prn nausea/vomit   oxyCODONE  (ROXICODONE ) 15 MG immediate release tablet Take 15 mg by mouth 4 (four) times daily as needed for pain.   pregabalin  (LYRICA ) 150 MG capsule Take 1 capsule (150 mg total) by mouth 3 (three) times daily.   Pyridoxine HCl (VITAMIN B-6 PO) Take by mouth.   sitaGLIPtin -metformin  (JANUMET ) 50-1000 MG tablet TAKE 1 TABLET BY MOUTH TWICE A DAY WITH FOOD   Tiotropium Bromide  Monohydrate (SPIRIVA  RESPIMAT) 2.5 MCG/ACT AERS Inhale 2 puffs into the lungs daily.   No facility-administered encounter medications on file as of 12/30/2023.    Allergies  Allergen Reactions   Avelox [Moxifloxacin] Anaphylaxis   Iohexol  Anaphylaxis    Pt given 300 for CT Angio was given 50 mg benadryl  prior to scan had no problems. Anaphylaxis?   Shellfish Allergy Hives    Review of Systems As per HPI  Objective:  BP 135/79   Pulse (!) 110   Temp 97.8 F (36.6 C)   Ht 5\' 4"  (1.626 m)   Wt 133 lb (60.3 kg)   SpO2 98%   BMI 22.83 kg/m    Wt Readings from Last 3 Encounters:  12/30/23 133 lb (60.3 kg)  11/22/23 137 lb (62.1 kg)  10/15/23 138 lb (62.6 kg)   Physical Exam Constitutional:      General: She is awake. She is not in acute distress.    Appearance: Normal appearance. She is ill-appearing.     Comments: Chronically ill-appearing   HENT:     Nose: Congestion and rhinorrhea present. Rhinorrhea is clear.  Eyes:     General: Allergic shiner present.     Conjunctiva/sclera:     Right eye: Right conjunctiva is  injected.     Left eye: Left conjunctiva is injected.     Comments: Watery eyes  Cardiovascular:     Rate and Rhythm: Normal rate and regular rhythm.     Heart sounds: Normal heart sounds.  Pulmonary:     Effort: Pulmonary effort is normal.     Breath  sounds: Examination of the right-upper field reveals wheezing. Examination of the left-upper field reveals wheezing. Examination of the right-middle field reveals decreased breath sounds. Examination of the left-middle field reveals decreased breath sounds. Examination of the right-lower field reveals decreased breath sounds. Examination of the left-lower field reveals decreased breath sounds. Decreased breath sounds and wheezing present.     Comments: Nasal cannula in place  Patient states at baseline  Musculoskeletal:     Comments: Generalized weakness  Skin:    Capillary Refill: Capillary refill takes less than 2 seconds.     Coloration: Skin is pale.     Comments: Excoriated, dry, flaky skin along posterior left shoulder, bilateral forearms, and chest   Neurological:     General: No focal deficit present.     Mental Status: She is alert, oriented to person, place, and time and easily aroused. Mental status is at baseline.  Psychiatric:        Attention and Perception: Attention and perception normal.        Mood and Affect: Mood is anxious. Affect is flat.        Speech: Speech normal.        Behavior: Behavior is withdrawn. Behavior is cooperative.        Thought Content: Thought content normal.        Cognition and Memory: Cognition and memory normal.        Judgment: Judgment normal.      Results for orders placed or performed in visit on 10/15/23  Anemia Profile B   Collection Time: 10/15/23  3:53 PM  Result Value Ref Range   Total Iron Binding Capacity 383 250 - 450 ug/dL   UIBC 161 096 - 045 ug/dL   Iron 88 27 - 409 ug/dL   Iron Saturation 23 15 - 55 %   Ferritin 30 15 - 150 ng/mL   Vitamin B-12 1,035 232 - 1,245 pg/mL   Folate 18.6 >3.0 ng/mL   WBC 8.6 3.4 - 10.8 x10E3/uL   RBC 3.94 3.77 - 5.28 x10E6/uL   Hemoglobin 11.4 11.1 - 15.9 g/dL   Hematocrit 81.1 91.4 - 46.6 %   MCV 93 79 - 97 fL   MCH 28.9 26.6 - 33.0 pg   MCHC 31.1 (L) 31.5 - 35.7 g/dL   RDW 78.2 (H)  95.6 - 15.4 %   Platelets 411 150 - 450 x10E3/uL   Neutrophils 68 Not Estab. %   Lymphs 17 Not Estab. %   Monocytes 11 Not Estab. %   Eos 3 Not Estab. %   Basos 0 Not Estab. %   Neutrophils Absolute 5.9 1.4 - 7.0 x10E3/uL   Lymphocytes Absolute 1.4 0.7 - 3.1 x10E3/uL   Monocytes Absolute 1.0 (H) 0.1 - 0.9 x10E3/uL   EOS (ABSOLUTE) 0.3 0.0 - 0.4 x10E3/uL   Basophils Absolute 0.0 0.0 - 0.2 x10E3/uL   Immature Granulocytes 1 Not Estab. %   Immature Grans (Abs) 0.0 0.0 - 0.1 x10E3/uL   Retic Ct Pct 3.1 (H) 0.6 - 2.6 %  CMP14+EGFR   Collection Time: 10/15/23  3:53 PM  Result Value Ref Range   Glucose 89 70 -  99 mg/dL   BUN 15 8 - 27 mg/dL   Creatinine, Ser 1.19 0.57 - 1.00 mg/dL   eGFR 98 >14 NW/GNF/6.21   BUN/Creatinine Ratio 22 12 - 28   Sodium 142 134 - 144 mmol/L   Potassium 4.2 3.5 - 5.2 mmol/L   Chloride 99 96 - 106 mmol/L   CO2 31 (H) 20 - 29 mmol/L   Calcium  9.3 8.7 - 10.3 mg/dL   Total Protein 5.9 (L) 6.0 - 8.5 g/dL   Albumin 3.7 (L) 3.9 - 4.9 g/dL   Globulin, Total 2.2 1.5 - 4.5 g/dL   Bilirubin Total <3.0 0.0 - 1.2 mg/dL   Alkaline Phosphatase 93 44 - 121 IU/L   AST 19 0 - 40 IU/L   ALT 15 0 - 32 IU/L  TSH   Collection Time: 10/15/23  3:53 PM  Result Value Ref Range   TSH 1.540 0.450 - 4.500 uIU/mL       12/30/2023    2:10 PM 09/17/2023    3:26 PM 07/16/2023    3:11 PM 04/16/2023    3:56 PM 09/25/2022    3:10 PM  Depression screen PHQ 2/9  Decreased Interest 0 0 0 0 1  Down, Depressed, Hopeless 0 1 1 1 1   PHQ - 2 Score 0 1 1 1 2   Altered sleeping 3  3 3 2   Tired, decreased energy 3  2 2 1   Change in appetite 3  2 2 1   Feeling bad or failure about yourself  0  0 0 1  Trouble concentrating 3  0 0 1  Moving slowly or fidgety/restless 3  0 0 0  Suicidal thoughts 0  0 0 1  PHQ-9 Score 15  8 8 9   Difficult doing work/chores Somewhat difficult  Somewhat difficult Not difficult at all Not difficult at all       12/30/2023    2:11 PM 07/16/2023    3:12 PM  04/16/2023    3:56 PM 09/25/2022    3:11 PM  GAD 7 : Generalized Anxiety Score  Nervous, Anxious, on Edge 3 2 2 3   Control/stop worrying 3 3 3 2   Worry too much - different things 3 3 3 2   Trouble relaxing 3 3 3 3   Restless 3 0 0 1  Easily annoyed or irritable 2 1 1 1   Afraid - awful might happen 3 0 0 1  Total GAD 7 Score 20 12 12 13   Anxiety Difficulty Very difficult Somewhat difficult Somewhat difficult Somewhat difficult    Pertinent labs & imaging results that were available during my care of the patient were reviewed by me and considered in my medical decision making.  Assessment & Plan:  Margaret Vaughn was seen today for allergies.  Diagnoses and all orders for this visit: 1. Irritant dermatitis (Primary) Will start medications as below. Discussed with patient how to take medications. Will refer as below per patient request for follow up with allergies. Education provided on dermatitis. Discussed mixing steroid cream and thick tub lotion (Equate Eczema, Cetaphil, Cerave, Aquaphor, or Eucerin) and then applying to affected areas. - levocetirizine (XYZAL) 5 MG tablet; Take 1 tablet (5 mg total) by mouth every evening.  Dispense: 90 tablet; Refill: 0 - famotidine (PEPCID) 20 MG tablet; Take 1 tablet (20 mg total) by mouth 2 (two) times daily.  Dispense: 60 tablet; Refill: 0 - Ambulatory referral to Allergy  2. Environmental allergies As above.  - levocetirizine (XYZAL) 5 MG tablet; Take 1  tablet (5 mg total) by mouth every evening.  Dispense: 90 tablet; Refill: 0 - famotidine (PEPCID) 20 MG tablet; Take 1 tablet (20 mg total) by mouth 2 (two) times daily.  Dispense: 60 tablet; Refill: 0 - Ambulatory referral to Allergy  3. Chronic respiratory failure with hypoxia (HCC) Stable. Continue current regimen. Reviewed notes from [facility], MD 11/22/23.  Patient to follow up with specialty.   4. GAD (generalized anxiety disorder) Will start medication as below.  Denies SI. Safety contract  established.  Provided information for [facility].  Patient to follow up in one month with PCP  Discussed side effects with patient.  - busPIRone (BUSPAR) 5 MG tablet; Take 1 tablet (5 mg total) by mouth 2 (two) times daily.  Dispense: 60 tablet; Refill: 0  Continue all other maintenance medications.  Follow up plan: Return in about 4 weeks (around 01/27/2024).   Continue healthy lifestyle choices, including diet (rich in fruits, vegetables, and lean proteins, and low in salt and simple carbohydrates) and exercise (at least 30 minutes of moderate physical activity daily).  Written and verbal instructions provided   The above assessment and management plan was discussed with the patient. The patient verbalized understanding of and has agreed to the management plan. Patient is aware to call the clinic if they develop any new symptoms or if symptoms persist or worsen. Patient is aware when to return to the clinic for a follow-up visit. Patient educated on when it is appropriate to go to the emergency department.   Jacqualyn Mates, DNP-FNP [facility] [address] [place], [place] [address] [phone]

## 2023-12-30 NOTE — Patient Instructions (Addendum)
 Stop pseudoephedrine and benadryl   Switch to xyzal and benadryl    Education provided on atopic dermatitis. Discussed mixing steroid cream and thick tub lotion (Equate Eczema, Cetaphil, Cerave, Aquaphor, or Eucerin) and then applying to affected areas twice daily for two weeks. Then continue with lotion only.   [facility]  Phone: [phone] Address: [address] [place], [place] [address] Hours:Open 24/7, No appointment required.  The [facility] ([facility]) is centrally located in the [place] at [address] [place]

## 2024-01-07 DIAGNOSIS — E119 Type 2 diabetes mellitus without complications: Secondary | ICD-10-CM | POA: Diagnosis not present

## 2024-01-07 DIAGNOSIS — M4802 Spinal stenosis, cervical region: Secondary | ICD-10-CM | POA: Diagnosis not present

## 2024-01-07 DIAGNOSIS — Z122 Encounter for screening for malignant neoplasm of respiratory organs: Secondary | ICD-10-CM | POA: Diagnosis not present

## 2024-01-07 DIAGNOSIS — G894 Chronic pain syndrome: Secondary | ICD-10-CM | POA: Diagnosis not present

## 2024-01-07 DIAGNOSIS — G4700 Insomnia, unspecified: Secondary | ICD-10-CM | POA: Diagnosis not present

## 2024-01-07 DIAGNOSIS — F17210 Nicotine dependence, cigarettes, uncomplicated: Secondary | ICD-10-CM | POA: Diagnosis not present

## 2024-01-07 DIAGNOSIS — Z79899 Other long term (current) drug therapy: Secondary | ICD-10-CM | POA: Diagnosis not present

## 2024-01-07 DIAGNOSIS — G629 Polyneuropathy, unspecified: Secondary | ICD-10-CM | POA: Diagnosis not present

## 2024-01-07 DIAGNOSIS — R030 Elevated blood-pressure reading, without diagnosis of hypertension: Secondary | ICD-10-CM | POA: Diagnosis not present

## 2024-01-07 DIAGNOSIS — R110 Nausea: Secondary | ICD-10-CM | POA: Diagnosis not present

## 2024-01-13 DIAGNOSIS — J449 Chronic obstructive pulmonary disease, unspecified: Secondary | ICD-10-CM | POA: Diagnosis not present

## 2024-01-13 DIAGNOSIS — J9601 Acute respiratory failure with hypoxia: Secondary | ICD-10-CM | POA: Diagnosis not present

## 2024-01-13 DIAGNOSIS — Z79899 Other long term (current) drug therapy: Secondary | ICD-10-CM | POA: Diagnosis not present

## 2024-01-21 ENCOUNTER — Other Ambulatory Visit: Payer: Self-pay | Admitting: Family Medicine

## 2024-01-21 DIAGNOSIS — F411 Generalized anxiety disorder: Secondary | ICD-10-CM

## 2024-01-21 DIAGNOSIS — Z9109 Other allergy status, other than to drugs and biological substances: Secondary | ICD-10-CM

## 2024-01-21 DIAGNOSIS — L249 Irritant contact dermatitis, unspecified cause: Secondary | ICD-10-CM

## 2024-01-25 DIAGNOSIS — J449 Chronic obstructive pulmonary disease, unspecified: Secondary | ICD-10-CM | POA: Diagnosis not present

## 2024-02-04 DIAGNOSIS — Z79899 Other long term (current) drug therapy: Secondary | ICD-10-CM | POA: Diagnosis not present

## 2024-02-04 DIAGNOSIS — G894 Chronic pain syndrome: Secondary | ICD-10-CM | POA: Diagnosis not present

## 2024-02-04 DIAGNOSIS — F17210 Nicotine dependence, cigarettes, uncomplicated: Secondary | ICD-10-CM | POA: Diagnosis not present

## 2024-02-04 DIAGNOSIS — M4802 Spinal stenosis, cervical region: Secondary | ICD-10-CM | POA: Diagnosis not present

## 2024-02-04 DIAGNOSIS — Z6822 Body mass index (BMI) 22.0-22.9, adult: Secondary | ICD-10-CM | POA: Diagnosis not present

## 2024-02-04 DIAGNOSIS — R030 Elevated blood-pressure reading, without diagnosis of hypertension: Secondary | ICD-10-CM | POA: Diagnosis not present

## 2024-02-04 DIAGNOSIS — E119 Type 2 diabetes mellitus without complications: Secondary | ICD-10-CM | POA: Diagnosis not present

## 2024-02-04 DIAGNOSIS — J449 Chronic obstructive pulmonary disease, unspecified: Secondary | ICD-10-CM | POA: Diagnosis not present

## 2024-02-11 DIAGNOSIS — Z79899 Other long term (current) drug therapy: Secondary | ICD-10-CM | POA: Diagnosis not present

## 2024-02-12 DIAGNOSIS — J449 Chronic obstructive pulmonary disease, unspecified: Secondary | ICD-10-CM | POA: Diagnosis not present

## 2024-02-15 ENCOUNTER — Emergency Department (HOSPITAL_COMMUNITY)
Admission: EM | Admit: 2024-02-15 | Discharge: 2024-02-15 | Disposition: A | Discharge status: Home / Self Care | Source: Ambulatory Visit | Attending: Emergency Medicine | Admitting: Emergency Medicine

## 2024-02-15 ENCOUNTER — Other Ambulatory Visit: Payer: Self-pay

## 2024-02-15 ENCOUNTER — Encounter (HOSPITAL_COMMUNITY): Payer: Self-pay | Admitting: Emergency Medicine

## 2024-02-15 ENCOUNTER — Ambulatory Visit: Payer: Self-pay | Admitting: *Deleted

## 2024-02-15 DIAGNOSIS — E119 Type 2 diabetes mellitus without complications: Secondary | ICD-10-CM | POA: Insufficient documentation

## 2024-02-15 DIAGNOSIS — T25011A Burn of unspecified degree of right ankle, initial encounter: Secondary | ICD-10-CM | POA: Insufficient documentation

## 2024-02-15 DIAGNOSIS — Z7982 Long term (current) use of aspirin: Secondary | ICD-10-CM | POA: Insufficient documentation

## 2024-02-15 DIAGNOSIS — I10 Essential (primary) hypertension: Secondary | ICD-10-CM | POA: Diagnosis not present

## 2024-02-15 DIAGNOSIS — T2020XA Burn of second degree of head, face, and neck, unspecified site, initial encounter: Secondary | ICD-10-CM | POA: Diagnosis not present

## 2024-02-15 DIAGNOSIS — J449 Chronic obstructive pulmonary disease, unspecified: Secondary | ICD-10-CM | POA: Diagnosis not present

## 2024-02-15 DIAGNOSIS — J45909 Unspecified asthma, uncomplicated: Secondary | ICD-10-CM | POA: Insufficient documentation

## 2024-02-15 DIAGNOSIS — T2000XA Burn of unspecified degree of head, face, and neck, unspecified site, initial encounter: Secondary | ICD-10-CM | POA: Diagnosis not present

## 2024-02-15 DIAGNOSIS — T310 Burns involving less than 10% of body surface: Secondary | ICD-10-CM | POA: Diagnosis not present

## 2024-02-15 DIAGNOSIS — T25021A Burn of unspecified degree of right foot, initial encounter: Secondary | ICD-10-CM | POA: Diagnosis not present

## 2024-02-15 DIAGNOSIS — X088XXA Exposure to other specified smoke, fire and flames, initial encounter: Secondary | ICD-10-CM | POA: Diagnosis not present

## 2024-02-15 DIAGNOSIS — Z7951 Long term (current) use of inhaled steroids: Secondary | ICD-10-CM | POA: Diagnosis not present

## 2024-02-15 MED ORDER — HYDROMORPHONE HCL 1 MG/ML IJ SOLN
1.0000 mg | Freq: Once | INTRAMUSCULAR | Status: AC
  Administered 2024-02-15: 1 mg via INTRAVENOUS
  Filled 2024-02-15: qty 1

## 2024-02-15 MED ORDER — OXYCODONE HCL 10 MG PO TABS: 10.0000 mg | ORAL_TABLET | Freq: Every day | ORAL | 0 refills | Status: AC | PRN
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
## 2024-02-15 NOTE — Telephone Encounter (Signed)
 On 3rd attempt got through to [facility].  She was unable to get a nurse to the line so asked if I would send a CRM.   I let her know I've sent my triage notes to them high priority.

## 2024-02-15 NOTE — ED Notes (Signed)
 Consult to [facility] at this time for transfer . [place]

## 2024-02-16 NOTE — Telephone Encounter (Signed)
 Spoke with pt. Appt scheduled for 7/25 with Dettinger.
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
## 2024-02-18 ENCOUNTER — Encounter: Payer: Self-pay | Admitting: Family Medicine

## 2024-02-18 ENCOUNTER — Ambulatory Visit: Admitting: Family Medicine

## 2024-02-18 VITALS — BP 120/72 | HR 113 | Ht 64.0 in | Wt 136.0 lb

## 2024-02-18 DIAGNOSIS — T25211D Burn of second degree of right ankle, subsequent encounter: Secondary | ICD-10-CM

## 2024-02-18 DIAGNOSIS — T2020XD Burn of second degree of head, face, and neck, unspecified site, subsequent encounter: Secondary | ICD-10-CM
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
## 2024-02-19 LAB — CMP14+EGFR
ALT: 13 IU/L (ref 0–32)
AST: 21 IU/L (ref 0–40)
Albumin: 3.8 g/dL — ABNORMAL LOW (ref 3.9–4.9)
Alkaline Phosphatase: 99 IU/L (ref 44–121)
BUN/Creatinine Ratio: 18 (ref 12–28)
BUN: 16 mg/dL (ref 8–27)
Bilirubin Total: <0.2 mg/dL (ref 0.0–1.2)
CO2: 26 mmol/L (ref 20–29)
Calcium: 9.2 mg/dL (ref 8.7–10.3)
Chloride: 97 mmol/L (ref 96–106)
Creatinine, Ser: 0.9 mg/dL (ref 0.57–1.00)
Globulin, Total: 2.3 g/dL (ref 1.5–4.5)
Glucose: 113 mg/dL — ABNORMAL HIGH (ref 70–99)
Potassium: 4.2 mmol/L (ref 3.5–5.2)
Sodium: 139 mmol/L (ref 134–144)
Total Protein: 6.1 g/dL (ref 6.0–8.5)
eGFR: 72 mL/min/1.73 (ref >59)

## 2024-02-19 LAB — CBC WITH DIFFERENTIAL/PLATELET
Basophils Absolute: 0 x10E3/uL (ref 0.0–0.2)
Basos: 0 %
EOS (ABSOLUTE): 0.2 x10E3/uL (ref 0.0–0.4)
Eos: 2 %
Hematocrit: 39.4 % (ref 34.0–46.6)
Hemoglobin: 12.1 g/dL (ref 11.1–15.9)
Immature Grans (Abs): 0 x10E3/uL (ref 0.0–0.1)
Immature Granulocytes: 0 %
Lymphocytes Absolute: 1.4 x10E3/uL (ref 0.7–3.1)
Lymphs: 17 %
MCH: 28.5 pg (ref 26.6–33.0)
MCHC: 30.7 g/dL — ABNORMAL LOW (ref 31.5–35.7)
MCV: 93 fL (ref 79–97)
Monocytes Absolute: 1 x10E3/uL — ABNORMAL HIGH (ref 0.1–0.9)
Monocytes: 12 %
Neutrophils Absolute: 5.9 x10E3/uL (ref 1.4–7.0)
Neutrophils: 69 %
Platelets: 354 x10E3/uL (ref 150–450)
RBC: 4.24 x10E6/uL (ref 3.77–5.28)
RDW: 15.1 % (ref 11.7–15.4)
WBC: 8.6 x10E3/uL (ref 3.4–10.8)

## 2024-02-25 ENCOUNTER — Other Ambulatory Visit: Payer: Self-pay | Admitting: Family Medicine

## 2024-02-25 ENCOUNTER — Ambulatory Visit: Admitting: Family Medicine

## 2024-02-25 ENCOUNTER — Ambulatory Visit: Payer: Self-pay | Admitting: Family Medicine

## 2024-02-25 ENCOUNTER — Encounter: Payer: Self-pay | Admitting: Family Medicine

## 2024-02-25 ENCOUNTER — Telehealth: Payer: Self-pay | Admitting: Family Medicine

## 2024-02-25 VITALS — BP 107/79 | HR 108 | Ht 64.0 in | Wt 137.0 lb

## 2024-02-25 DIAGNOSIS — Z9109 Other allergy status, other than to drugs and biological substances: Secondary | ICD-10-CM

## 2024-02-25 DIAGNOSIS — T2020XD Burn of second degree of head, face, and neck, unspecified site, subsequent encounter: Secondary | ICD-10-CM

## 2024-02-25 DIAGNOSIS — E1169 Type 2 diabetes mellitus with other specified complication: Secondary | ICD-10-CM | POA: Diagnosis not present

## 2024-02-25 DIAGNOSIS — G950 Syringomyelia and syringobulbia: Secondary | ICD-10-CM | POA: Diagnosis not present

## 2024-02-25 DIAGNOSIS — I10 Essential (primary) hypertension: Secondary | ICD-10-CM | POA: Diagnosis not present

## 2024-02-25 DIAGNOSIS — J441 Chronic obstructive pulmonary disease with (acute) exacerbation: Secondary | ICD-10-CM

## 2024-02-25 DIAGNOSIS — E1142 Type 2 diabetes mellitus with diabetic polyneuropathy: Secondary | ICD-10-CM | POA: Diagnosis not present

## 2024-02-25 DIAGNOSIS — T25211D Burn of second degree of right ankle, subsequent encounter: Secondary | ICD-10-CM

## 2024-02-25 DIAGNOSIS — E782 Mixed hyperlipidemia: Secondary | ICD-10-CM

## 2024-02-25 DIAGNOSIS — F411 Generalized anxiety disorder: Secondary | ICD-10-CM | POA: Diagnosis not present

## 2024-02-25 DIAGNOSIS — M797 Fibromyalgia: Secondary | ICD-10-CM

## 2024-02-25 DIAGNOSIS — L249 Irritant contact dermatitis, unspecified cause: Secondary | ICD-10-CM | POA: Diagnosis not present

## 2024-02-25 DIAGNOSIS — J449 Chronic obstructive pulmonary disease, unspecified: Secondary | ICD-10-CM

## 2024-02-25 DIAGNOSIS — J9611 Chronic respiratory failure with hypoxia: Secondary | ICD-10-CM | POA: Diagnosis not present

## 2024-02-25 LAB — BAYER DCA HB A1C WAIVED: HB A1C (BAYER DCA - WAIVED): 5.4 % (ref 4.8–5.6)

## 2024-02-25 LAB — LIPID PANEL

## 2024-02-25 MED ORDER — DULOXETINE HCL 60 MG PO CPEP: 60.0000 mg | ORAL_CAPSULE | Freq: Every day | ORAL | 3 refills | Status: AC

## 2024-02-25 MED ORDER — ENALAPRIL MALEATE 5 MG PO TABS
5.0000 mg | ORAL_TABLET | Freq: Every day | ORAL | 3 refills | Status: AC
Start: 2024-02-25 — End: ?

## 2024-02-25 MED ORDER — ATORVASTATIN CALCIUM 40 MG PO TABS: 40.0000 mg | ORAL_TABLET | Freq: Every day | ORAL | 3 refills | Status: AC

## 2024-02-25 MED ORDER — JANUMET 50-1000 MG PO TABS: ORAL_TABLET | ORAL | 3 refills | Status: AC

## 2024-02-25 MED ORDER — SANTYL 250 UNIT/GM EX OINT: 1.0000 | TOPICAL_OINTMENT | Freq: Every day | CUTANEOUS | 0 refills | Status: DC

## 2024-02-25 MED ORDER — BUSPIRONE HCL 5 MG PO TABS: 5.0000 mg | ORAL_TABLET | Freq: Two times a day (BID) | ORAL | 3 refills | Status: DC

## 2024-02-25 MED ORDER — ALBUTEROL SULFATE HFA 108 (90 BASE) MCG/ACT IN AERS: INHALATION_SPRAY | RESPIRATORY_TRACT | 3 refills | Status: AC

## 2024-02-25 MED ORDER — FAMOTIDINE 20 MG PO TABS: 20.0000 mg | ORAL_TABLET | Freq: Two times a day (BID) | ORAL | 3 refills | Status: AC

## 2024-02-25 MED ORDER — RIZATRIPTAN BENZOATE 10 MG PO TABS
10.0000 mg | ORAL_TABLET | ORAL | 1 refills | Status: AC | PRN
Start: 2024-02-25 — End: ?
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
## 2024-02-25 NOTE — Telephone Encounter (Signed)
 Name from pharmacy: [facility]      Pharmacy comment: Alternative Requested:DRUG NOT COVERED; PLEASE CONTACT INSURANCE OR CONSIDER ALTERNATIVE.

## 2024-02-25 NOTE — Addendum Note (Signed)
 Addended by: MARYANNE CHEW on: 02/25/2024 02:52 PM   Modules accepted: Orders

## 2024-02-26 LAB — CBC WITH DIFFERENTIAL/PLATELET
Basophils Absolute: 0 x10E3/uL (ref 0.0–0.2)
Basos: 0 %
EOS (ABSOLUTE): 0.1 x10E3/uL (ref 0.0–0.4)
Eos: 2 %
Hematocrit: 38.7 % (ref 34.0–46.6)
Hemoglobin: 12.5 g/dL (ref 11.1–15.9)
Immature Grans (Abs): 0 x10E3/uL (ref 0.0–0.1)
Immature Granulocytes: 0 %
Lymphocytes Absolute: 1.3 x10E3/uL (ref 0.7–3.1)
Lymphs: 16 %
MCH: 29.8 pg (ref 26.6–33.0)
MCHC: 32.3 g/dL (ref 31.5–35.7)
MCV: 92 fL (ref 79–97)
Monocytes Absolute: 0.8 x10E3/uL (ref 0.1–0.9)
Monocytes: 10 %
Neutrophils Absolute: 5.8 x10E3/uL (ref 1.4–7.0)
Neutrophils: 71 %
Platelets: 386 x10E3/uL (ref 150–450)
RBC: 4.2 x10E6/uL (ref 3.77–5.28)
RDW: 15 % (ref 11.7–15.4)
WBC: 8.1 x10E3/uL (ref 3.4–10.8)

## 2024-02-26 LAB — LIPID PANEL
Chol/HDL Ratio: 2.4 ratio (ref 0.0–4.4)
Cholesterol, Total: 94 mg/dL — ABNORMAL LOW (ref 100–199)
HDL: 39 mg/dL — ABNORMAL LOW (ref >39)
LDL Chol Calc (NIH): 35 mg/dL (ref 0–99)
Triglycerides: 110 mg/dL (ref 0–149)
VLDL Cholesterol Cal: 20 mg/dL (ref 5–40)

## 2024-02-26 LAB — CMP14+EGFR
ALT: 42 IU/L — ABNORMAL HIGH (ref 0–32)
AST: 42 IU/L — ABNORMAL HIGH (ref 0–40)
Albumin: 3.7 g/dL — ABNORMAL LOW (ref 3.9–4.9)
Alkaline Phosphatase: 98 IU/L (ref 44–121)
BUN/Creatinine Ratio: 14 (ref 12–28)
BUN: 9 mg/dL (ref 8–27)
Bilirubin Total: <0.2 mg/dL (ref 0.0–1.2)
CO2: 30 mmol/L — ABNORMAL HIGH (ref 20–29)
Calcium: 9 mg/dL (ref 8.7–10.3)
Chloride: 98 mmol/L (ref 96–106)
Creatinine, Ser: 0.66 mg/dL (ref 0.57–1.00)
Globulin, Total: 2.1 g/dL (ref 1.5–4.5)
Glucose: 99 mg/dL (ref 70–99)
Potassium: 4.2 mmol/L (ref 3.5–5.2)
Sodium: 140 mmol/L (ref 134–144)
Total Protein: 5.8 g/dL — ABNORMAL LOW (ref 6.0–8.5)
eGFR: 99 mL/min/1.73 (ref >59)

## 2024-02-28 ENCOUNTER — Telehealth: Payer: Self-pay

## 2024-02-28 ENCOUNTER — Other Ambulatory Visit (HOSPITAL_COMMUNITY): Payer: Self-pay

## 2024-02-28 DIAGNOSIS — T25211D Burn of second degree of right ankle, subsequent encounter: Secondary | ICD-10-CM

## 2024-02-28 DIAGNOSIS — T2020XD Burn of second degree of head, face, and neck, unspecified site, subsequent encounter: Secondary | ICD-10-CM

## 2024-02-28 NOTE — Telephone Encounter (Signed)
 Pharmacy Patient Advocate Encounter  Received notification from [facility] that Prior Authorization for Santyl  250UNIT/GM ointment  has been DENIED.  Full denial letter will be uploaded to the media tab. See denial reason below.   PA #/Case ID/Reference #: [id number]

## 2024-02-28 NOTE — Telephone Encounter (Signed)
 PA request has been Submitted. New Encounter has been or will be created for follow up. For additional info see Pharmacy Prior Auth telephone encounter from 02/28/24.

## 2024-02-28 NOTE — Telephone Encounter (Signed)
 Pharmacy Patient Advocate Encounter   Received notification from Pt Calls Messages that prior authorization for Santyl  250UNIT/GM ointment is required/requested.   Insurance verification completed.   The patient is insured through [facility] .   Per test claim: PA required; PA submitted to above mentioned insurance via CoverMyMeds Key/confirmation #/EOC [id number] Status is pending

## 2024-02-28 NOTE — Telephone Encounter (Signed)
Do you want to send in something else?

## 2024-03-01 ENCOUNTER — Other Ambulatory Visit: Payer: Self-pay | Admitting: Family Medicine

## 2024-03-01 DIAGNOSIS — T25211D Burn of second degree of right ankle, subsequent encounter: Secondary | ICD-10-CM

## 2024-03-01 DIAGNOSIS — T2020XD Burn of second degree of head, face, and neck, unspecified site, subsequent encounter: Secondary | ICD-10-CM

## 2024-03-01 MED ORDER — MEDIHONEY WOUND/BURN DRESSING EX PSTE
1.0000 | PASTE | Freq: Every day | CUTANEOUS | 1 refills | Status: AC
Start: 2024-03-01 — End: ?

## 2024-03-01 NOTE — Telephone Encounter (Signed)
 Pt made aware. Pharmacy said they would need to order for her. She is waiting to hear back from them. She will call back if needed.

## 2024-03-01 NOTE — Telephone Encounter (Signed)
 I tried to send for Medihoney, but that is usually not covered either but we will see

## 2024-03-01 NOTE — Addendum Note (Signed)
 Addended by: MARYANNE CHEW on: 03/01/2024 07:58 AM   Modules accepted: Orders

## 2024-03-02 ENCOUNTER — Other Ambulatory Visit: Payer: Self-pay

## 2024-03-02 ENCOUNTER — Ambulatory Visit: Payer: Self-pay | Admitting: Family Medicine

## 2024-03-02 DIAGNOSIS — R7989 Other specified abnormal findings of blood chemistry: Secondary | ICD-10-CM

## 2024-03-02 NOTE — Telephone Encounter (Signed)
 TC to [facility] and ok'd for pt to use the OTC Makuna Honey, they have this behind the counter for her and will let her know.

## 2024-03-02 NOTE — Telephone Encounter (Signed)
 Let makuna honey is not 1 that I can prescribe as it is not in our system, can they just change it to makuna honey from her order

## 2024-03-02 NOTE — Telephone Encounter (Signed)
 Name from pharmacy: [facility] 100% PASTE  Pharmacy comment: Alternative Requested:MEDIHONEY PASTE IS ON MANUFACTURE OUTAGE SINCE APRIL. MANUKA HONEY SKIN CARE CREAM AVAILAVLE OTC.

## 2024-03-03 DIAGNOSIS — M4802 Spinal stenosis, cervical region: Secondary | ICD-10-CM | POA: Diagnosis not present

## 2024-03-03 DIAGNOSIS — G629 Polyneuropathy, unspecified: Secondary | ICD-10-CM | POA: Diagnosis not present

## 2024-03-03 DIAGNOSIS — G4700 Insomnia, unspecified: Secondary | ICD-10-CM | POA: Diagnosis not present

## 2024-03-03 DIAGNOSIS — F17210 Nicotine dependence, cigarettes, uncomplicated: Secondary | ICD-10-CM | POA: Diagnosis not present

## 2024-03-03 DIAGNOSIS — Z6822 Body mass index (BMI) 22.0-22.9, adult: Secondary | ICD-10-CM | POA: Diagnosis not present

## 2024-03-03 DIAGNOSIS — J449 Chronic obstructive pulmonary disease, unspecified: Secondary | ICD-10-CM | POA: Diagnosis not present

## 2024-03-03 DIAGNOSIS — Z79899 Other long term (current) drug therapy: Secondary | ICD-10-CM | POA: Diagnosis not present

## 2024-03-03 DIAGNOSIS — E119 Type 2 diabetes mellitus without complications: Secondary | ICD-10-CM | POA: Diagnosis not present

## 2024-03-03 DIAGNOSIS — G894 Chronic pain syndrome: Secondary | ICD-10-CM | POA: Diagnosis not present

## 2024-03-03 DIAGNOSIS — R030 Elevated blood-pressure reading, without diagnosis of hypertension: Secondary | ICD-10-CM | POA: Diagnosis not present

## 2024-03-09 DIAGNOSIS — Z79899 Other long term (current) drug therapy: Secondary | ICD-10-CM | POA: Diagnosis not present

## 2024-03-14 DIAGNOSIS — J449 Chronic obstructive pulmonary disease, unspecified: Secondary | ICD-10-CM | POA: Diagnosis not present

## 2024-03-16 ENCOUNTER — Other Ambulatory Visit (HOSPITAL_COMMUNITY): Payer: Self-pay | Admitting: Family Medicine

## 2024-03-16 DIAGNOSIS — I714 Abdominal aortic aneurysm, without rupture, unspecified: Secondary | ICD-10-CM

## 2024-03-17 ENCOUNTER — Ambulatory Visit (HOSPITAL_COMMUNITY)
Admission: RE | Admit: 2024-03-17 | Discharge: 2024-03-17 | Disposition: A | Discharge status: Home / Self Care | Source: Ambulatory Visit | Attending: Vascular Surgery | Admitting: Vascular Surgery

## 2024-03-17 DIAGNOSIS — I714 Abdominal aortic aneurysm, without rupture, unspecified: Secondary | ICD-10-CM | POA: Diagnosis not present

## 2024-03-20 ENCOUNTER — Ambulatory Visit: Payer: Self-pay | Admitting: Family Medicine

## 2024-03-22 ENCOUNTER — Telehealth: Payer: Self-pay

## 2024-03-22 DIAGNOSIS — T2000XA Burn of unspecified degree of head, face, and neck, unspecified site, initial encounter: Secondary | ICD-10-CM

## 2024-03-22 NOTE — Progress Notes (Signed)
 Complex Care Management Note  Care Guide Note 03/22/2024 Name: Margaret Vaughn MRN: [id number] DOB: 07-23-1961  Margaret Vaughn is a 63 y.o. year old female who sees Dettinger, Fonda LABOR, MD for primary care. I reached out to Tawni ONEIDA Coder by phone today to offer complex care management services.  [name] was given information about Complex Care Management services today including:   The Complex Care Management services include support from the care team which includes your Nurse Care Manager, Clinical Social Worker, or Pharmacist.  The Complex Care Management team is here to help remove barriers to the health concerns and goals most important to you. Complex Care Management services are voluntary, and the patient may decline or stop services at any time by request to their care team member.   Complex Care Management Consent Status: Patient agreed to services and verbal consent obtained.   Follow up plan:  Telephone appointment with complex care management team member scheduled for:  04/05/24 @ 2 pm  Encounter Outcome:  Patient Scheduled

## 2024-03-24 ENCOUNTER — Ambulatory Visit: Admitting: Family Medicine

## 2024-03-24 ENCOUNTER — Encounter: Payer: Self-pay | Admitting: Family Medicine

## 2024-03-24 ENCOUNTER — Other Ambulatory Visit

## 2024-03-24 VITALS — BP 118/65 | HR 50 | Temp 97.2°F | Ht 64.0 in | Wt 134.0 lb

## 2024-03-24 DIAGNOSIS — R7989 Other specified abnormal findings of blood chemistry: Secondary | ICD-10-CM

## 2024-03-24 DIAGNOSIS — T2020XD Burn of second degree of head, face, and neck, unspecified site, subsequent encounter: Secondary | ICD-10-CM | POA: Diagnosis not present

## 2024-03-24 DIAGNOSIS — J301 Allergic rhinitis due to pollen: Secondary | ICD-10-CM

## 2024-03-24 DIAGNOSIS — T25211D Burn of second degree of right ankle, subsequent encounter: Secondary | ICD-10-CM | POA: Diagnosis not present
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
## 2024-03-25 LAB — CMP14+EGFR
ALT: 13 IU/L (ref 0–32)
AST: 14 IU/L (ref 0–40)
Albumin: 3.9 g/dL (ref 3.9–4.9)
Alkaline Phosphatase: 105 IU/L (ref 44–121)
BUN/Creatinine Ratio: 14 (ref 12–28)
BUN: 10 mg/dL (ref 8–27)
Bilirubin Total: <0.2 mg/dL (ref 0.0–1.2)
CO2: 30 mmol/L — ABNORMAL HIGH (ref 20–29)
Calcium: 9.4 mg/dL (ref 8.7–10.3)
Chloride: 97 mmol/L (ref 96–106)
Creatinine, Ser: 0.71 mg/dL (ref 0.57–1.00)
Globulin, Total: 2.2 g/dL (ref 1.5–4.5)
Glucose: 90 mg/dL (ref 70–99)
Potassium: 3.9 mmol/L (ref 3.5–5.2)
Sodium: 141 mmol/L (ref 134–144)
Total Protein: 6.1 g/dL (ref 6.0–8.5)
eGFR: 95 mL/min/1.73 (ref >59)

## 2024-03-27 DIAGNOSIS — J449 Chronic obstructive pulmonary disease, unspecified: Secondary | ICD-10-CM | POA: Diagnosis not present

## 2024-04-03 ENCOUNTER — Ambulatory Visit: Payer: Self-pay | Admitting: Family Medicine

## 2024-04-03 NOTE — Telephone Encounter (Signed)
 Reviewed results with patient and patient voiced understanding.

## 2024-04-05 ENCOUNTER — Other Ambulatory Visit: Payer: Self-pay | Admitting: *Deleted

## 2024-04-05 ENCOUNTER — Other Ambulatory Visit: Payer: Self-pay

## 2024-04-05 NOTE — Patient Outreach (Signed)
 Complex Care Management   Visit Note  04/05/2024  Name:  Margaret Vaughn MRN: [id number] DOB: [birth year]  Situation: Referral received for Complex Care Management related to COPD I obtained verbal consent from Patient.  Visit completed with Patient  on the phone  Background:   Past Medical History:  Diagnosis Date   Acid reflux    Allergies    Anemia    Asthma    Bronchitis    Cervical stenosis of spine    Chronic pain syndrome    Complication of anesthesia    difficulty waking up from anesthesia   COPD (chronic obstructive pulmonary disease) (HCC)    DDD (degenerative disc disease), lumbar    Depression with anxiety    Diabetes mellitus without complication (HCC)    Dyspnea    Dysrhythmia    PSVT   Fatigue    H/O blood clots    H/O echocardiogram 1989   Per pt, done @ [facility]- Showed mitral valve prolapse   Headache(784.0)    Heart murmur    Hemorrhoids, external    History of hiatal hernia    History of kidney stones    History of PSVT (paroxysmal supraventricular tachycardia)    HTN (hypertension)    Hyperlipidemia    Hypoxemia    Insomnia    Memory loss    Myalgia and myositis    Narcolepsy    On home O2    2-4 L @ HS   OSA (obstructive sleep apnea)    Pneumonia    Sleeping difficulty    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HCC)    Thoracalgia    Tobacco abuse    Tremor    Ulcer disease     Assessment: Patient Reported Symptoms:  Cognitive Cognitive Status: Able to follow simple commands, Alert and oriented to person, place, and time, Insightful and able to interpret abstract concepts, Normal speech and language skills Cognitive/Intellectual Conditions Management [RPT]: None reported or documented in medical history or problem list   Health Maintenance Behaviors: Annual physical exam, Sleep adequate, Healthy diet, Stress management, Spiritual practice(s) Healing Pattern: Slow Health Facilitated by: Healthy diet, Pain control, Prayer/meditation, Rest,  Stress management  Neurological Neurological Review of Symptoms: Weakness (Patient reports that she has migraines but she has not had migraines this week.) Neurological Comment: Reports weakness due to back pain.  HEENT HEENT Symptoms Reported:  (Patient reports that she is hard of hearing.) HEENT Management Strategies: Adequate rest, Routine screening HEENT Self-Management Outcome: 3 (uncertain) HEENT Comment: Reports that she will consider speaking with Dr. Maryanne about a referral for Hearing evalution    Cardiovascular Cardiovascular Symptoms Reported: Dizziness Does patient have uncontrolled Hypertension?: No Cardiovascular Management Strategies: Adequate rest, Routine screening Cardiovascular Self-Management Outcome: 3 (uncertain) Cardiovascular Comment: Reports that dizziness comes from back pains.  Respiratory Respiratory Symptoms Reported: Productive cough, Wheezing, Shortness of breath Other Respiratory Symptoms: On home oxygen .  Patient also uses inhalers to help with wheezing and shortness of breath. Additional Respiratory Details: Patient is on 02 at 3L per  [place] Respiratory Management Strategies: Oxygen  therapy, Adequate rest, Routine screening Respiratory Self-Management Outcome: 3 (uncertain)  Endocrine Is patient diabetic?: Yes Is patient checking blood sugars at home?: Yes List most recent blood sugar readings, include date and time of day: Blood sugar 107 fasting today Endocrine Self-Management Outcome: 4 (good) Endocrine Comment: Highest-162 and lowest- 79  Gastrointestinal Gastrointestinal Symptoms Reported: No symptoms reported Gastrointestinal Management Strategies: Adequate rest Gastrointestinal Self-Management Outcome: 4 (good)    Genitourinary  Genitourinary Symptoms Reported: No symptoms reported Genitourinary Management Strategies: Adequate rest Genitourinary Self-Management Outcome: 4 (good)  Integumentary Additional Integumentary Details: Reports that burn  is healed up and reports dry skin.  Patient uses use Gold bond diabetic and Aveno lotion to help with dry skin. Skin Management Strategies: Adequate rest, Routine screening Skin Self-Management Outcome: 4 (good)  Musculoskeletal Musculoskelatal Symptoms Reviewed: Back pain, Limited mobility, Weakness, Joint pain Additional Musculoskeletal Details: Patient reports that she has back pain to her lower back.  She is on medication to help back pain. She reports also having problems with right shoulder, Hips bilaterally and feet. Musculoskeletal Management Strategies: Adequate rest, Medical device, Medication therapy, Coping strategies, Routine screening Musculoskeletal Self-Management Outcome: 2 (bad) Musculoskeletal Comment: Reports that she fell in Febuary 2025.  Reports that she had RSV illness and she had to have stiches. Falls in the past year?: Yes Number of falls in past year: 2 or more Was there an injury with Fall?: Yes Fall Risk Category Calculator: 3 Patient Fall Risk Level: High Fall Risk Fall risk Follow up: Falls evaluation completed, Education provided, Falls prevention discussed  Psychosocial Psychosocial Symptoms Reported: Sadness - if selected complete PHQ 2-9, Depression - if selected complete PHQ 2-9 Additional Psychological Details: Reports that she takes anti-depressant.  She denies the need for couseling.  She feels like the anti-depressent helps. Behavioral Management Strategies: Medication therapy, Coping strategies, Counseling Behavioral Health Self-Management Outcome: 3 (uncertain) Major Change/Loss/Stressor/Fears (CP): Medical condition, self Techniques to Cope with Loss/Stress/Change: Medication, Spiritual practice(s), Support group Quality of Family Relationships: helpful, involved, supportive Do you feel physically threatened by others?: No    04/05/2024    PHQ2-9 Depression Screening   Little interest or pleasure in doing things Not at all  Feeling down,  depressed, or hopeless Not at all  PHQ-2 - Total Score 0  Trouble falling or staying asleep, or sleeping too much    Feeling tired or having little energy    Poor appetite or overeating     Feeling bad about yourself - or that you are a failure or have let yourself or your family down    Trouble concentrating on things, such as reading the newspaper or watching television    Moving or speaking so slowly that other people could have noticed.  Or the opposite - being so fidgety or restless that you have been moving around a lot more than usual    Thoughts that you would be better off dead, or hurting yourself in some way    PHQ2-9 Total Score    If you checked off any problems, how difficult have these problems made it for you to do your work, take care of things at home, or get along with other people    Depression Interventions/Treatment      There were no vitals filed for this visit.  Medications Reviewed Today     Reviewed by Jorja Nichole LABOR, RN (Case Manager) on 04/05/24 at 1423  Med List Status: <None>   Medication Order Taking? Sig Documenting Provider Last Dose Status Informant  albuterol  (PROVENTIL ) (2.5 MG/3ML) 0.083% nebulizer solution [id number] Yes Take 3 mLs (2.5 mg total) by nebulization every 6 (six) hours as needed for wheezing or shortness of breath. Zelaya, Oscar A, PA-C  Active   albuterol  (VENTOLIN  HFA) 108 (90 Base) MCG/ACT inhaler [id number] Yes INHALE 2 PUFFS BY MOUTH EVERY 6 HOURS AS NEEDED FOR WHEEZE OR SHORTNESS OF BREATH Dettinger, Fonda LABOR, MD  Active   AMITIZA   24 MCG capsule [id number] Yes TAKE 1 CAPSULE (24 MCG TOTAL) BY MOUTH 2 (TWO) TIMES DAILY WITH A MEAL. Kennedy Charmaine CROME, NP  Active Self           Med Note (ROBB, MELANIE A   Mon Aug 23, 2023  2:09 PM) Taking as needed  aspirin EC 81 MG tablet [id number] Yes Take 81 mg by mouth daily. Swallow whole. [provider]  Active Self           Med Note SEABRON, LESLIE M   Mon Nov 22, 2023  3:56 PM)     atorvastatin  (LIPITOR) 40 MG tablet [id number] Yes Take 1 tablet (40 mg total) by mouth daily. Dettinger, Fonda LABOR, MD  Active   b complex vitamins capsule [id number] Yes Take 1 capsule by mouth daily. [provider]  Active   baclofen  (LIORESAL ) 10 MG tablet [id number] Yes Take 10 mg by mouth 3 (three) times daily as needed for muscle spasms. [provider]  Active Self  benzonatate  (TESSALON ) 200 MG capsule [id number] Yes Take 1 capsule (200 mg total) by mouth 3 (three) times daily as needed for cough. Dettinger, Fonda LABOR, MD  Active Self  budesonide -formoterol  (SYMBICORT ) 160-4.5 MCG/ACT inhaler [id number] Yes Inhale 2 puffs into the lungs 2 (two) times daily. Hunsucker, Donnice SAUNDERS, MD  Active   buprenorphine (SUBUTEX) 2 MG SUBL SL tablet [id number]  Place 2 mg under the tongue 2 (two) times daily.  Patient not taking: Reported on 04/05/2024   [provider]  Active   busPIRone  (BUSPAR ) 5 MG tablet [id number] Yes Take 1 tablet (5 mg total) by mouth 2 (two) times daily. Dettinger, Fonda LABOR, MD  Active   Cyanocobalamin  (VITAMIN B-12 PO) [id number] Yes Take by mouth. [provider]  Active   DULoxetine  (CYMBALTA ) 60 MG capsule [id number] Yes Take 1 capsule (60 mg total) by mouth daily. Dettinger, Fonda LABOR, MD  Active   enalapril  (VASOTEC ) 5 MG tablet [id number]  Take 1 tablet (5 mg total) by mouth daily.  Patient not taking: Reported on 04/05/2024   Dettinger, Fonda LABOR, MD  Active   famotidine  (PEPCID ) 20 MG tablet [id number] Yes Take 1 tablet (20 mg total) by mouth 2 (two) times daily. Dettinger, Fonda LABOR, MD  Active   leptospermum manuka honey (MEDIHONEY) PSTE paste [id number]  Apply 1 Application topically daily.  Patient not taking: Reported on 04/05/2024   Dettinger, Fonda LABOR, MD  Active   levocetirizine (XYZAL ) 5 MG tablet [id number] Yes Take 1 tablet (5 mg total) by mouth every evening. Cathlene Marry Lenis, FNP  Active   Multiple Vitamins-Minerals (HAIR SKIN  NAILS PO) [id number] Yes Take 3 tablets by mouth daily. [provider]  Active Self  Multiple Vitamins-Minerals (MULTIVITAMIN WITH MINERALS) tablet [id number] Yes Take 1 tablet by mouth daily. [provider]  Active Self  naloxone  (NARCAN ) nasal spray 4 mg/0.1 mL [id number] Yes Place 1 spray into the nose once. [provider]  Active Self           Med Note ([name], CHUCK KANDICE Schaumann Aug 12, 2023  7:29 PM) Never used, has at home  nystatin  (MYCOSTATIN ) 100000 UNIT/ML suspension [id number] Yes Take 5 mLs by mouth 4 (four) times daily as needed (thrush). [provider]  Active Self  ondansetron  (ZOFRAN ) 4 MG tablet [id number] Yes Take 1 tablet (4 mg total) by mouth as needed for nausea or vomiting. Take one 1 tablet every 8 hours as needed Dettinger,  Fonda LABOR, MD  Active   ondansetron  (ZOFRAN -ODT) 4 MG disintegrating tablet [id number] Yes 4mg  ODT q4 hours prn nausea/vomit Pollina, Lonni PARAS, MD  Active   Oxycodone  HCl 10 MG TABS [id number] Yes Take 1 tablet (10 mg total) by mouth 5 (five) times daily as needed (Take this medicine once a day in addition to the other dosing of your medicine.). Patsey Lot, MD  Active   pregabalin  (LYRICA ) 100 MG capsule [id number] Yes Take 100 mg by mouth 3 (three) times daily. [provider]  Active   Pyridoxine HCl (VITAMIN B-6 PO) [id number] Yes Take by mouth. [provider]  Active   rizatriptan  (MAXALT ) 10 MG tablet [id number] Yes Take 1 tablet (10 mg total) by mouth as needed for migraine. May repeat in 2 hours if needed Dettinger, Fonda LABOR, MD  Active   SANTYL  250 UNIT/GM ointment [id number]  APPLY 1 APPLICATION TOPICALLY DAILY  Patient not taking: Reported on 04/05/2024   Dettinger, Fonda LABOR, MD  Active   sitaGLIPtin -metformin  (JANUMET ) 50-1000 MG tablet [id number] Yes TAKE 1 TABLET BY MOUTH TWICE A DAY WITH FOOD Dettinger, Fonda LABOR, MD  Active   Tiotropium Bromide  Monohydrate (SPIRIVA  RESPIMAT) 2.5 MCG/ACT  AERS [id number] Yes Inhale 2 puffs into the lungs daily. Hunsucker, Donnice SAUNDERS, MD  Active   zolpidem (AMBIEN) 5 MG tablet [id number]  Take 5 mg by mouth at bedtime as needed.  Patient not taking: Reported on 04/05/2024   [provider]  Active             Recommendation:   PCP Follow-up Continue Current Plan of Care Follow up with PCP about electric wheelchair referral  Follow Up Plan:   Telephone follow-up 2 weeks  Poet Hineman, RN, BSN, ACM RN Care Manager [employer] [phone]

## 2024-04-05 NOTE — Patient Instructions (Signed)
 Visit Information  [name] was given information about Medicaid Managed Care team care coordination services as a part of their [employer] Medicaid benefit. Margaret Vaughn   If you would like to schedule transportation through your Healthy [facility] plan, please call the following number at least 2 days in advance of your appointment: [phone]  For information about your ride after you set it up, call Ride Assist at [phone]. Use this number to activate a Will Call pickup, or if your transportation is late for a scheduled pickup. Use this number, too, if you need to make a change or cancel a previously scheduled reservation.  If you need transportation services right away, call [phone]. The after-hours call center is staffed 24 hours to handle ride assistance and urgent reservation requests (including discharges) 365 days a year. Urgent trips include sick visits, hospital discharge requests and life-sustaining treatment.  Call the [facility] Line at [phone], at any time, 24 hours a day, 7 days a week. If you are in danger or need immediate medical attention call 911.   Margaret Vaughn - following are the goals we discussed in your visit today:   Goals Addressed             This Visit's Progress    VBCI RN Care Plan-COPD       Problems:  Chronic Disease Management support and education needs related to COPD  Goal: Over the next 90 days the Patient will attend all scheduled medical appointments: with PCP and Specialist as evidenced by keeping all schedule appointments.        continue to work with Medical illustrator and/or Social Worker to address care management and care coordination needs related to COPD as evidenced by adherence to care management team scheduled appointments     take all medications exactly as prescribed and will call provider for medication related questions as evidenced by compliance.    verbalize basic understanding of COPD disease  process and self health management plan as evidenced by verbal explanation, recognize monitor symptoms and life style changes.  Interventions:   COPD Interventions: Advised patient to track and manage COPD triggers Advised patient to self assesses COPD action plan zone and make appointment with provider if in the yellow zone for 48 hours without improvement Assessed social determinant of health barriers Discussed the importance of adequate rest and management of fatigue with COPD Provided education about and advised patient to utilize infection prevention strategies to reduce risk of respiratory infection Provided instruction about proper use of medications used for management of COPD including inhalers Provided patient with basic written and verbal COPD education on self care/management/and exacerbation prevention Screening for signs and symptoms of depression related to chronic disease state  Use of home oxygen   Patient Self-Care Activities:  Attend all scheduled provider appointments Call pharmacy for medication refills 3-7 days in advance of running out of medications Call provider office for new concerns or questions  Take medications as prescribed   eliminate smoking in my home identify and avoid work-related triggers identify and remove indoor air pollutants limit outdoor activity during cold weather eliminate symptom triggers at home get at least 7 to 8 hours of sleep at night  Plan:  Telephone follow up appointment with care management team member scheduled for:  04/19/24 @ 2 pm             Please see education materials related to COPD provided by MyChart link.  Patient verbalizes understanding of  instructions and care plan provided today and agrees to view in MyChart. Active MyChart status and patient understanding of how to access instructions and care plan via MyChart confirmed with patient.     Telephone follow up appointment with Managed Medicaid care  management team member scheduled for:  Shauntel Prest, RN, BSN, ACM RN Care Manager [facility] [phone]   Following is a copy of your plan of care:  There are no care plans that you recently modified to display for this patient.   Visit Information  Margaret Vaughn was given information about Medicaid Managed Care team care coordination services as a part of their [employer] Medicaid benefit. Margaret Vaughn   If you would like to schedule transportation through your Healthy [facility] plan, please call the following number at least 2 days in advance of your appointment: [phone]  For information about your ride after you set it up, call Ride Assist at [phone]. Use this number to activate a Will Call pickup, or if your transportation is late for a scheduled pickup. Use this number, too, if you need to make a change or cancel a previously scheduled reservation.  If you need transportation services right away, call [phone]. The after-hours call center is staffed 24 hours to handle ride assistance and urgent reservation requests (including discharges) 365 days a year. Urgent trips include sick visits, hospital discharge requests and life-sustaining treatment.  Call the [facility] Line at [phone], at any time, 24 hours a day, 7 days a week. If you are in danger or need immediate medical attention call 911.   Margaret Vaughn - following are the goals we discussed in your visit today:   Goals Addressed             This Visit's Progress    VBCI RN Care Plan-COPD       Problems:  Chronic Disease Management support and education needs related to COPD  Goal: Over the next 90 days the Patient will attend all scheduled medical appointments: with PCP and Specialist as evidenced by keeping all schedule appointments.        continue to work with Medical illustrator and/or Social Worker to address care management and care coordination needs related to COPD  as evidenced by adherence to care management team scheduled appointments     take all medications exactly as prescribed and will call provider for medication related questions as evidenced by compliance.    verbalize basic understanding of COPD disease process and self health management plan as evidenced by verbal explanation, recognize monitor symptoms and life style changes.  Interventions:   COPD Interventions: Advised patient to track and manage COPD triggers Advised patient to self assesses COPD action plan zone and make appointment with provider if in the yellow zone for 48 hours without improvement Assessed social determinant of health barriers Discussed the importance of adequate rest and management of fatigue with COPD Provided education about and advised patient to utilize infection prevention strategies to reduce risk of respiratory infection Provided instruction about proper use of medications used for management of COPD including inhalers Provided patient with basic written and verbal COPD education on self care/management/and exacerbation prevention Screening for signs and symptoms of depression related to chronic disease state  Use of home oxygen   Patient Self-Care Activities:  Attend all scheduled provider appointments Call pharmacy for medication refills 3-7 days in advance of running out of medications Call provider office for new concerns or questions  Take  medications as prescribed   eliminate smoking in my home identify and avoid work-related triggers identify and remove indoor air pollutants limit outdoor activity during cold weather eliminate symptom triggers at home get at least 7 to 8 hours of sleep at night  Plan:  Telephone follow up appointment with care management team member scheduled for:  04/19/24 @ 2 pm             Please see education materials related to COPD provided by MyChart link.  Patient verbalizes understanding of instructions and  care plan provided today and agrees to view in MyChart. Active MyChart status and patient understanding of how to access instructions and care plan via MyChart confirmed with patient.     Telephone follow up appointment with Managed Medicaid care management team member scheduled for: 04/19/24 @ 2 pm  Sharmane Dame, RN, BSN, ACM RN Care Manager [facility] [phone]   Following is a copy of your plan of care:  There are no care plans that you recently modified to display for this patient.

## 2024-04-07 DIAGNOSIS — G629 Polyneuropathy, unspecified: Secondary | ICD-10-CM | POA: Diagnosis not present

## 2024-04-07 DIAGNOSIS — Z79899 Other long term (current) drug therapy: Secondary | ICD-10-CM | POA: Diagnosis not present

## 2024-04-07 DIAGNOSIS — Z760 Encounter for issue of repeat prescription: Secondary | ICD-10-CM | POA: Diagnosis not present

## 2024-04-07 DIAGNOSIS — M4802 Spinal stenosis, cervical region: Secondary | ICD-10-CM | POA: Diagnosis not present

## 2024-04-07 DIAGNOSIS — M545 Low back pain, unspecified: Secondary | ICD-10-CM | POA: Diagnosis not present

## 2024-04-07 DIAGNOSIS — R030 Elevated blood-pressure reading, without diagnosis of hypertension: Secondary | ICD-10-CM | POA: Diagnosis not present

## 2024-04-07 DIAGNOSIS — E559 Vitamin D deficiency, unspecified: Secondary | ICD-10-CM | POA: Diagnosis not present

## 2024-04-07 DIAGNOSIS — M546 Pain in thoracic spine: Secondary | ICD-10-CM | POA: Diagnosis not present

## 2024-04-07 DIAGNOSIS — M542 Cervicalgia: Secondary | ICD-10-CM | POA: Diagnosis not present

## 2024-04-07 DIAGNOSIS — M129 Arthropathy, unspecified: Secondary | ICD-10-CM | POA: Diagnosis not present

## 2024-04-07 DIAGNOSIS — E7800 Pure hypercholesterolemia, unspecified: Secondary | ICD-10-CM | POA: Diagnosis not present

## 2024-04-07 DIAGNOSIS — Z1231 Encounter for screening mammogram for malignant neoplasm of breast: Secondary | ICD-10-CM | POA: Diagnosis not present

## 2024-04-07 DIAGNOSIS — E119 Type 2 diabetes mellitus without complications: Secondary | ICD-10-CM | POA: Diagnosis not present

## 2024-04-07 DIAGNOSIS — F17210 Nicotine dependence, cigarettes, uncomplicated: Secondary | ICD-10-CM | POA: Diagnosis not present

## 2024-04-14 DIAGNOSIS — Z79899 Other long term (current) drug therapy: Secondary | ICD-10-CM | POA: Diagnosis not present

## 2024-04-14 DIAGNOSIS — J449 Chronic obstructive pulmonary disease, unspecified: Secondary | ICD-10-CM | POA: Diagnosis not present

## 2024-04-18 ENCOUNTER — Other Ambulatory Visit: Payer: Self-pay | Admitting: *Deleted

## 2024-04-18 DIAGNOSIS — Z9109 Other allergy status, other than to drugs and biological substances: Secondary | ICD-10-CM

## 2024-04-18 DIAGNOSIS — L249 Irritant contact dermatitis, unspecified cause: Secondary | ICD-10-CM

## 2024-04-18 MED ORDER — LEVOCETIRIZINE DIHYDROCHLORIDE 5 MG PO TABS: 5.0000 mg | ORAL_TABLET | Freq: Every evening | ORAL | 1 refills | Status: AC

## 2024-04-19 ENCOUNTER — Other Ambulatory Visit: Payer: Self-pay | Admitting: *Deleted

## 2024-04-19 NOTE — Patient Instructions (Signed)
 Tawni ONEIDA Coder - I am sorry I was unable to reach you today for our scheduled appointment. I work with Dettinger, Fonda LABOR, MD and am calling to support your healthcare needs. Please contact me at [phone] at your earliest convenience. I have rescheduled appointment for 05/04/24 @ 1 pm. I look forward to speaking with you soon.   Thank you,  Pachia Strum, RN, BSN, ACM RN Care Manager [employer] [phone]

## 2024-05-04 ENCOUNTER — Other Ambulatory Visit: Payer: Self-pay | Admitting: *Deleted

## 2024-05-04 NOTE — Patient Instructions (Signed)
 Visit Information I am sorry you where unable to completed visit today.  I have rescheduled your visit for 05/29/24 @ 10 am.    Please call the care guide team at [phone] if you need to cancel, schedule, or reschedule an appointment.   Please call the Suicide and Crisis Lifeline: 988 call the [place]  National Suicide Prevention Lifeline: 361-551-3630 or TTY: 7378703316 TTY 412-715-3871) to talk to a trained counselor call 1-800-273-TALK (toll free, 24 hour hotline) go to [facility] [address] [place] [phone]) call the [facility] Crisis Line: [phone] call 911 if you are experiencing a Mental Health or Behavioral Health Crisis or need someone to talk to.  Evangelina Delancey, RN, BSN, Theatre manager [employer] [phone]

## 2024-05-16 DIAGNOSIS — Z1231 Encounter for screening mammogram for malignant neoplasm of breast: Secondary | ICD-10-CM | POA: Diagnosis not present

## 2024-05-16 LAB — HM MAMMOGRAPHY

## 2024-05-16 LAB — HM DEXA SCAN

## 2024-05-21 DIAGNOSIS — Z9981 Dependence on supplemental oxygen: Secondary | ICD-10-CM | POA: Diagnosis not present

## 2024-05-21 DIAGNOSIS — Z7982 Long term (current) use of aspirin: Secondary | ICD-10-CM | POA: Diagnosis not present

## 2024-05-21 DIAGNOSIS — I714 Abdominal aortic aneurysm, without rupture, unspecified: Secondary | ICD-10-CM | POA: Diagnosis not present

## 2024-05-21 DIAGNOSIS — W19XXXA Unspecified fall, initial encounter: Secondary | ICD-10-CM | POA: Diagnosis not present

## 2024-05-21 DIAGNOSIS — F17210 Nicotine dependence, cigarettes, uncomplicated: Secondary | ICD-10-CM | POA: Diagnosis not present

## 2024-05-21 DIAGNOSIS — I10 Essential (primary) hypertension: Secondary | ICD-10-CM | POA: Diagnosis not present

## 2024-05-21 DIAGNOSIS — R10A1 Flank pain, right side: Secondary | ICD-10-CM | POA: Diagnosis not present

## 2024-05-21 DIAGNOSIS — M545 Low back pain, unspecified: Secondary | ICD-10-CM | POA: Diagnosis not present

## 2024-05-21 DIAGNOSIS — M549 Dorsalgia, unspecified: Secondary | ICD-10-CM | POA: Diagnosis not present

## 2024-05-21 DIAGNOSIS — J449 Chronic obstructive pulmonary disease, unspecified: Secondary | ICD-10-CM | POA: Diagnosis not present

## 2024-05-21 DIAGNOSIS — E119 Type 2 diabetes mellitus without complications: Secondary | ICD-10-CM | POA: Diagnosis not present

## 2024-05-29 ENCOUNTER — Other Ambulatory Visit: Payer: Self-pay | Admitting: *Deleted

## 2024-05-29 ENCOUNTER — Encounter: Payer: Self-pay | Admitting: *Deleted

## 2024-05-29 VITALS — BP 138/98

## 2024-05-29 DIAGNOSIS — F411 Generalized anxiety disorder: Secondary | ICD-10-CM

## 2024-05-29 NOTE — Patient Instructions (Signed)
 Tawni ONEIDA Coder - I am sorry I was unable to reach you today for our scheduled appointment. I work with Dettinger, Fonda LABOR, MD and am calling to support your healthcare needs. Please contact me at [phone] at your earliest convenience. I look forward to speaking with you soon.   Thank you,  Rosina Forte, BSN RN [facility] RN Care Manager Direct Dial: [phone]  Fax: [phone]

## 2024-05-29 NOTE — Patient Instructions (Signed)
 Visit Information  Ms. Wickens was given information about Medicaid Managed Care team care coordination services as a part of their [employer] Medicaid benefit. LASHIKA ERKER   If you would like to schedule transportation through your [facility] plan, please call the following number at least 2 days in advance of your appointment: [phone]  For information about your ride after you set it up, call Ride Assist at [phone]. Use this number to activate a Will Call pickup, or if your transportation is late for a scheduled pickup. Use this number, too, if you need to make a change or cancel a previously scheduled reservation.  If you need transportation services right away, call [phone]. The after-hours call center is staffed 24 hours to handle ride assistance and urgent reservation requests (including discharges) 365 days a year. Urgent trips include sick visits, hospital discharge requests and life-sustaining treatment.  Call the [facility] Line at [phone], at any time, 24 hours a day, 7 days a week. If you are in danger or need immediate medical attention call 911.   Please see education materials related to COPD, Smoking Cessation provided by MyChart link.  Patient verbalizes understanding of instructions and care plan provided today and agrees to view in MyChart. Active MyChart status and patient understanding of how to access instructions and care plan via MyChart confirmed with patient.     Telephone follow up appointment with Managed Medicaid care management team member scheduled for:06-28-2024 at 10:00 am  Rosina Forte, BSN RN [facility], [facility] RN Care Manager Direct Dial: [phone]  Fax: [phone]   Following is a copy of your plan of care:  There are no care plans that you recently modified to display for this patient.

## 2024-05-29 NOTE — Patient Outreach (Signed)
 Complex Care Management   Visit Note  05/29/2024  Name:  Margaret Vaughn MRN: [id number] DOB: 02-06-1961  Situation: Referral received for Complex Care Management related to COPD I obtained verbal consent from Patient.  Visit completed with Patient  on the phone  Background:   Past Medical History:  Diagnosis Date   Acid reflux    Allergies    Anemia    Asthma    Bronchitis    Cervical stenosis of spine    Chronic pain syndrome    Complication of anesthesia    difficulty waking up from anesthesia   COPD (chronic obstructive pulmonary disease) (HCC)    DDD (degenerative disc disease), lumbar    Depression with anxiety    Diabetes mellitus without complication (HCC)    Dyspnea    Dysrhythmia    PSVT   Fatigue    H/O blood clots    H/O echocardiogram 1989   Per pt, done @ [facility]- Showed mitral valve prolapse   Headache(784.0)    Heart murmur    Hemorrhoids, external    History of hiatal hernia    History of kidney stones    History of PSVT (paroxysmal supraventricular tachycardia)    HTN (hypertension)    Hyperlipidemia    Hypoxemia    Insomnia    Memory loss    Myalgia and myositis    Narcolepsy    On home O2    2-4 L @ HS   OSA (obstructive sleep apnea)    Pneumonia    Sleeping difficulty    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HCC)    Thoracalgia    Tobacco abuse    Tremor    Ulcer disease     Assessment: Patient Reported Symptoms:  Cognitive Cognitive Status: No symptoms reported   Health Maintenance Behaviors: Annual physical exam  Neurological Neurological Review of Symptoms: No symptoms reported    HEENT HEENT Symptoms Reported: Nasal discharge, Tearing      Cardiovascular Cardiovascular Symptoms Reported: Dizziness, Swelling in legs or feet Does patient have uncontrolled Hypertension?: Yes Is patient checking Blood Pressure at home?: No    Respiratory Respiratory Symptoms Reported: Chest tightness, Productive cough, Wheezing Respiratory  Management Strategies: Oxygen  therapy Respiratory Self-Management Outcome: 3 (uncertain)  Endocrine Endocrine Symptoms Reported: No symptoms reported Is patient diabetic?: Yes Is patient checking blood sugars at home?: Yes List most recent blood sugar readings, include date and time of day: CBG 107    Gastrointestinal Gastrointestinal Symptoms Reported: No symptoms reported      Genitourinary Genitourinary Symptoms Reported: Difficulty initiating stream Additional Genitourinary Details: reports difficulty starting and continuing stream Genitourinary Self-Management Outcome: 3 (uncertain)  Integumentary Integumentary Symptoms Reported: No symptoms reported    Musculoskeletal Musculoskelatal Symptoms Reviewed: Back pain, Limited mobility, Weakness   Falls in the past year?: Yes Number of falls in past year: 2 or more Was there an injury with Fall?: Yes Fall Risk Category Calculator: 3 Patient Fall Risk Level: High Fall Risk Patient at Risk for Falls Due to: History of fall(s), Impaired balance/gait Fall risk Follow up: Falls evaluation completed, Education provided  Psychosocial Psychosocial Symptoms Reported: Anxiety - if selected complete GAD, Depression - if selected complete PHQ 2-9 Behavioral Management Strategies: Coping strategies Major Change/Loss/Stressor/Fears (CP): Medical condition, self      05/29/2024    PHQ2-9 Depression Screening   Little interest or pleasure in doing things More than half the days  Feeling down, depressed, or hopeless Several days  PHQ-2 - Total Score  3  Trouble falling or staying asleep, or sleeping too much Nearly every day  Feeling tired or having little energy Nearly every day  Poor appetite or overeating  More than half the days  Feeling bad about yourself - or that you are a failure or have let yourself or your family down Not at all  Trouble concentrating on things, such as reading the newspaper or watching television More than half the  days  Moving or speaking so slowly that other people could have noticed.  Or the opposite - being so fidgety or restless that you have been moving around a lot more than usual Several days  Thoughts that you would be better off dead, or hurting yourself in some way Not at all  PHQ2-9 Total Score 14  If you checked off any problems, how difficult have these problems made it for you to do your work, take care of things at home, or get along with other people Somewhat difficult  Depression Interventions/Treatment Medication, Currently on Treatment    Vitals:   05/29/24 0959  BP: (!) 138/98    Medications Reviewed Today     Reviewed by Bertrum Rosina HERO, RN (Registered Nurse) on 05/29/24 at [phone]  Med List Status: <None>   Medication Order Taking? Sig Documenting Provider Last Dose Status Informant  albuterol  (PROVENTIL ) (2.5 MG/3ML) 0.083% nebulizer solution [id number] Yes Take 3 mLs (2.5 mg total) by nebulization every 6 (six) hours as needed for wheezing or shortness of breath. Zelaya, Oscar A, PA-C  Active   albuterol  (VENTOLIN  HFA) 108 (90 Base) MCG/ACT inhaler [id number] Yes INHALE 2 PUFFS BY MOUTH EVERY 6 HOURS AS NEEDED FOR WHEEZE OR SHORTNESS OF BREATH Dettinger, Fonda LABOR, MD  Active   AMITIZA  24 MCG capsule [id number] Yes TAKE 1 CAPSULE (24 MCG TOTAL) BY MOUTH 2 (TWO) TIMES DAILY WITH A MEAL.  Patient taking differently: as needed.   Kennedy Charmaine CROME, NP  Active Self           Med Note (ROBB, MELANIE A   Mon Aug 23, 2023  2:09 PM) Taking as needed  aspirin EC 81 MG tablet [id number] Yes Take 81 mg by mouth daily. Swallow whole. [provider]  Active Self           Med Note SEABRON, LESLIE M   Mon Nov 22, 2023  3:56 PM)    atorvastatin  (LIPITOR) 40 MG tablet [id number] Yes Take 1 tablet (40 mg total) by mouth daily. Dettinger, Fonda LABOR, MD  Active   b complex vitamins capsule [id number] Yes Take 1 capsule by mouth daily. [provider]  Active   baclofen  (LIORESAL )  10 MG tablet [id number] Yes Take 10 mg by mouth 3 (three) times daily as needed for muscle spasms. [provider]  Active Self  benzonatate  (TESSALON ) 200 MG capsule [id number] Yes Take 1 capsule (200 mg total) by mouth 3 (three) times daily as needed for cough. Dettinger, Fonda LABOR, MD  Active Self  budesonide -formoterol  (SYMBICORT ) 160-4.5 MCG/ACT inhaler [id number] Yes Inhale 2 puffs into the lungs 2 (two) times daily. Hunsucker, Donnice SAUNDERS, MD  Active   buprenorphine (SUBUTEX) 2 MG SUBL SL tablet [id number] Yes Place 2 mg under the tongue 2 (two) times daily. [provider]  Active   busPIRone  (BUSPAR ) 5 MG tablet [id number] Yes Take 1 tablet (5 mg total) by mouth 2 (two) times daily. Dettinger, Fonda LABOR, MD  Active   Cyanocobalamin  (VITAMIN B-12 PO) [id number] Yes Take by  mouth. [provider]  Active   DULoxetine  (CYMBALTA ) 60 MG capsule [id number] Yes Take 1 capsule (60 mg total) by mouth daily. Dettinger, Fonda LABOR, MD  Active   enalapril  (VASOTEC ) 5 MG tablet [id number]  Take 1 tablet (5 mg total) by mouth daily.  Patient not taking: Reported on 05/29/2024   Dettinger, Fonda LABOR, MD  Active   famotidine  (PEPCID ) 20 MG tablet [id number] Yes Take 1 tablet (20 mg total) by mouth 2 (two) times daily. Dettinger, Fonda LABOR, MD  Active   leptospermum manuka honey (MEDIHONEY) PSTE paste [id number]  Apply 1 Application topically daily.  Patient not taking: Reported on 05/29/2024   Dettinger, Fonda LABOR, MD  Consider Medication Status and Discontinue   levocetirizine (XYZAL ) 5 MG tablet [id number] Yes Take 1 tablet (5 mg total) by mouth every evening. Dettinger, Fonda LABOR, MD  Active   Multiple Vitamins-Minerals [facility] SKIN NAILS PO) [id number] Yes Take 3 tablets by mouth daily. [provider]  Active Self  Multiple Vitamins-Minerals (MULTIVITAMIN WITH MINERALS) tablet [id number] Yes Take 1 tablet by mouth daily. [provider]  Active Self  naloxone  (NARCAN ) nasal spray 4  mg/0.1 mL [id number] Yes Place 1 spray into the nose once. [provider]  Active Self           Med Note ([name], CHUCK KANDICE Schaumann Aug 12, 2023  7:29 PM) Never used, has at home  nystatin  (MYCOSTATIN ) 100000 UNIT/ML suspension [id number] Yes Take 5 mLs by mouth 4 (four) times daily as needed (thrush). [provider]  Active Self  ondansetron  (ZOFRAN ) 4 MG tablet [id number] Yes Take 1 tablet (4 mg total) by mouth as needed for nausea or vomiting. Take one 1 tablet every 8 hours as needed Dettinger, Fonda LABOR, MD  Active   ondansetron  (ZOFRAN -ODT) 4 MG disintegrating tablet [id number] Yes 4mg  ODT q4 hours prn nausea/vomit Pollina, Lonni PARAS, MD  Active   Oxycodone  HCl 10 MG TABS [id number] Yes Take 1 tablet (10 mg total) by mouth 5 (five) times daily as needed (Take this medicine once a day in addition to the other dosing of your medicine.). Patsey Lot, MD  Active   pregabalin  (LYRICA ) 100 MG capsule [id number] Yes Take 100 mg by mouth 3 (three) times daily. [provider]  Active   Pyridoxine HCl (VITAMIN B-6 PO) [id number] Yes Take by mouth. [provider]  Active   rizatriptan  (MAXALT ) 10 MG tablet [id number] Yes Take 1 tablet (10 mg total) by mouth as needed for migraine. May repeat in 2 hours if needed Dettinger, Fonda LABOR, MD  Active   SANTYL  250 UNIT/GM ointment [id number]  APPLY 1 APPLICATION TOPICALLY DAILY  Patient not taking: Reported on 05/29/2024   Dettinger, Fonda LABOR, MD  Consider Medication Status and Discontinue   sitaGLIPtin -metformin  (JANUMET ) 50-1000 MG tablet [id number] Yes TAKE 1 TABLET BY MOUTH TWICE A DAY WITH FOOD Dettinger, Fonda LABOR, MD  Active   Tiotropium Bromide  Monohydrate (SPIRIVA  RESPIMAT) 2.5 MCG/ACT AERS [id number] Yes Inhale 2 puffs into the lungs daily. Hunsucker, Donnice SAUNDERS, MD  Active   zolpidem (AMBIEN) 5 MG tablet [id number] Yes Take 5 mg by mouth at bedtime as needed. [provider]  Active              Recommendation:   Continue Current Plan of Care Smoking Cessation - 1800 QUITNOW  Follow Up Plan:   Telephone follow-up in 1 month  Rosina Forte, BSN RN [facility],  Population Health RN Care Manager Direct Dial: [phone]  Fax: [phone]

## 2024-06-09 DIAGNOSIS — M8588 Other specified disorders of bone density and structure, other site: Secondary | ICD-10-CM | POA: Diagnosis not present

## 2024-06-09 DIAGNOSIS — M4802 Spinal stenosis, cervical region: Secondary | ICD-10-CM | POA: Diagnosis not present

## 2024-06-09 DIAGNOSIS — R030 Elevated blood-pressure reading, without diagnosis of hypertension: Secondary | ICD-10-CM | POA: Diagnosis not present

## 2024-06-09 DIAGNOSIS — Z6822 Body mass index (BMI) 22.0-22.9, adult: Secondary | ICD-10-CM | POA: Diagnosis not present

## 2024-06-09 DIAGNOSIS — G629 Polyneuropathy, unspecified: Secondary | ICD-10-CM | POA: Diagnosis not present

## 2024-06-09 DIAGNOSIS — I2510 Atherosclerotic heart disease of native coronary artery without angina pectoris: Secondary | ICD-10-CM | POA: Diagnosis not present

## 2024-06-09 DIAGNOSIS — F17210 Nicotine dependence, cigarettes, uncomplicated: Secondary | ICD-10-CM | POA: Diagnosis not present

## 2024-06-09 DIAGNOSIS — E119 Type 2 diabetes mellitus without complications: Secondary | ICD-10-CM | POA: Diagnosis not present

## 2024-06-09 DIAGNOSIS — G4700 Insomnia, unspecified: Secondary | ICD-10-CM | POA: Diagnosis not present

## 2024-06-09 DIAGNOSIS — Z79899 Other long term (current) drug therapy: Secondary | ICD-10-CM | POA: Diagnosis not present

## 2024-06-09 DIAGNOSIS — G950 Syringomyelia and syringobulbia: Secondary | ICD-10-CM | POA: Diagnosis not present

## 2024-06-12 DIAGNOSIS — Z79899 Other long term (current) drug therapy: Secondary | ICD-10-CM | POA: Diagnosis not present

## 2024-06-13 ENCOUNTER — Encounter: Payer: Self-pay | Admitting: Licensed Clinical Social Worker

## 2024-06-13 ENCOUNTER — Other Ambulatory Visit: Payer: Self-pay | Admitting: Licensed Clinical Social Worker

## 2024-06-13 NOTE — Patient Outreach (Signed)
 Complex Care Management   Visit Note  06/13/2024  Name:  Margaret Vaughn MRN: [id number] DOB: 1961-04-24  Situation: Referral received for Complex Care Management related to Mental/Behavioral Health diagnosis GAD/Insomnia. I obtained verbal consent from Patient.  Visit completed with Patient  on the phone  Background:   Past Medical History:  Diagnosis Date   Acid reflux    Allergies    Anemia    Asthma    Bronchitis    Cervical stenosis of spine    Chronic pain syndrome    Complication of anesthesia    difficulty waking up from anesthesia   COPD (chronic obstructive pulmonary disease) (HCC)    DDD (degenerative disc disease), lumbar    Depression with anxiety    Diabetes mellitus without complication (HCC)    Dyspnea    Dysrhythmia    PSVT   Fatigue    H/O blood clots    H/O echocardiogram 1989   Per pt, done @ [facility]- Showed mitral valve prolapse   Headache(784.0)    Heart murmur    Hemorrhoids, external    History of hiatal hernia    History of kidney stones    History of PSVT (paroxysmal supraventricular tachycardia)    HTN (hypertension)    Hyperlipidemia    Hypoxemia    Insomnia    Memory loss    Myalgia and myositis    Narcolepsy    On home O2    2-4 L @ HS   OSA (obstructive sleep apnea)    Pneumonia    Sleeping difficulty    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HCC)    Thoracalgia    Tobacco abuse    Tremor    Ulcer disease     Assessment: Patient Reported Symptoms:  Cognitive Cognitive Status: No symptoms reported Cognitive/Intellectual Conditions Management [RPT]: None reported or documented in medical history or problem list   Health Maintenance Behaviors: Annual physical exam Healing Pattern: Slow Health Facilitated by: Rest, Stress management, Pain control, Healthy diet, Prayer/meditation  Neurological Neurological Review of Symptoms: No symptoms reported Neurological Management Strategies: Routine screening Neurological Self-Management  Outcome: 3 (uncertain)  HEENT HEENT Symptoms Reported: Nasal discharge, Tearing HEENT Management Strategies: Adequate rest, Routine screening HEENT Self-Management Outcome: 3 (uncertain)    Cardiovascular Cardiovascular Symptoms Reported: Swelling in legs or feet, Dizziness Does patient have uncontrolled Hypertension?: Yes Is patient checking Blood Pressure at home?: No Cardiovascular Management Strategies: Adequate rest, Routine screening Cardiovascular Self-Management Outcome: 3 (uncertain)  Respiratory Respiratory Symptoms Reported: Chest tightness, Productive cough Respiratory Management Strategies: Oxygen  therapy Respiratory Self-Management Outcome: 3 (uncertain)  Endocrine Endocrine Symptoms Reported: No symptoms reported Is patient diabetic?: Yes Is patient checking blood sugars at home?: Yes Endocrine Self-Management Outcome: 4 (good)  Gastrointestinal Gastrointestinal Symptoms Reported: No symptoms reported Gastrointestinal Management Strategies: Adequate rest Gastrointestinal Self-Management Outcome: 4 (good)    Genitourinary Genitourinary Symptoms Reported: Difficulty initiating stream Genitourinary Management Strategies: Adequate rest Genitourinary Self-Management Outcome: 3 (uncertain)  Integumentary Integumentary Symptoms Reported: No symptoms reported Skin Management Strategies: Adequate rest, Routine screening Skin Self-Management Outcome: 4 (good)  Musculoskeletal Musculoskelatal Symptoms Reviewed: Back pain, Limited mobility, Weakness Musculoskeletal Management Strategies: Adequate rest, Medication therapy, Medical device, Routine screening, Coping strategies Musculoskeletal Self-Management Outcome: 2 (bad) Falls in the past year?: Yes Number of falls in past year: 2 or more Was there an injury with Fall?: Yes Fall Risk Category Calculator: 3 Patient Fall Risk Level: High Fall Risk    Psychosocial Psychosocial Symptoms Reported: Anxiety - if selected complete  GAD, Depression - if selected complete PHQ 2-9, Alteration in sleep habits Additional Psychological Details: Reports that she takes anti-depressant. She denies the need for couseling. She feels like the anti-depressent helps. Behavioral Management Strategies: Coping strategies, Medication therapy Behavioral Health Self-Management Outcome: 2 (bad) Major Change/Loss/Stressor/Fears (CP): Medical condition, self Techniques to Cope with Loss/Stress/Change: Spiritual practice(s), Medication Quality of Family Relationships: helpful, involved, supportive Do you feel physically threatened by others?: No    06/13/2024    PHQ2-9 Depression Screening   Little interest or pleasure in doing things More than half the days  Feeling down, depressed, or hopeless More than half the days  PHQ-2 - Total Score 4  Trouble falling or staying asleep, or sleeping too much Nearly every day  Feeling tired or having little energy Nearly every day  Poor appetite or overeating  More than half the days  Feeling bad about yourself - or that you are a failure or have let yourself or your family down Not at all  Trouble concentrating on things, such as reading the newspaper or watching television More than half the days  Moving or speaking so slowly that other people could have noticed.  Or the opposite - being so fidgety or restless that you have been moving around a lot more than usual Several days  Thoughts that you would be better off dead, or hurting yourself in some way Not at all  PHQ2-9 Total Score 15  If you checked off any problems, how difficult have these problems made it for you to do your work, take care of things at home, or get along with other people Somewhat difficult  Depression Interventions/Treatment Medication, Currently on Treatment   SDOH Interventions    Flowsheet Row Patient Outreach Telephone from 06/13/2024 in [place] [facility] DEPARTMENT Patient Outreach Telephone from 05/29/2024 in  [place] POPULATION HEALTH DEPARTMENT Patient Outreach Telephone from 04/05/2024 in  POPULATION HEALTH DEPARTMENT Office Visit from 02/25/2024 in [facility] Office Visit from 02/18/2024 in [facility] Office Visit from 12/30/2023 in [facility]  SDOH Interventions        Food Insecurity Interventions Intervention Not Indicated -- Intervention Not Indicated -- -- --  Housing Interventions Intervention Not Indicated -- Intervention Not Indicated -- -- --  Transportation Interventions Intervention Not Indicated -- Intervention Not Indicated -- -- --  Utilities Interventions Intervention Not Indicated -- Intervention Not Indicated -- -- --  Depression Interventions/Treatment  Medication, Currently on Treatment Medication, Currently on Treatment -- Medication, Currently on Treatment Medication, Currently on Treatment Medication, Currently on Treatment  Financial Strain Interventions Intervention Not Indicated -- -- -- -- --  Stress Interventions Provide Counseling, Community Resources Provided, Other (Comment)  [Pt denies wanting talk therapy , she has done this in the past and it was not helpful] -- -- -- -- --    There were no vitals filed for this visit.    Medications Reviewed Today     Reviewed by Merlynn Lyle CROME, LCSW (Social Worker) on 06/13/24 at 1115  Med List Status: <None>   Medication Order Taking? Sig Documenting Provider Last Dose Status Informant  albuterol  (PROVENTIL ) (2.5 MG/3ML) 0.083% nebulizer solution [id number]  Take 3 mLs (2.5 mg total) by nebulization every 6 (six) hours as needed for wheezing or shortness of breath. Zelaya, Oscar A, PA-C  Active   albuterol  (VENTOLIN  HFA) 108 (90 Base) MCG/ACT inhaler [id number]  INHALE 2 PUFFS BY  MOUTH EVERY 6 HOURS AS NEEDED FOR WHEEZE OR SHORTNESS OF BREATH Dettinger, Fonda LABOR, MD  Active   AMITIZA  24 MCG capsule [id number]  TAKE  1 CAPSULE (24 MCG TOTAL) BY MOUTH 2 (TWO) TIMES DAILY WITH A MEAL.  Patient taking differently: as needed.   Kennedy Charmaine CROME, NP  Active Self           Med Note (ROBB, MELANIE A   Mon Aug 23, 2023  2:09 PM) Taking as needed  aspirin EC 81 MG tablet [id number]  Take 81 mg by mouth daily. Swallow whole. [provider]  Active Self           Med Note SEABRON, LESLIE M   Mon Nov 22, 2023  3:56 PM)    atorvastatin  (LIPITOR) 40 MG tablet [id number]  Take 1 tablet (40 mg total) by mouth daily. Dettinger, Fonda LABOR, MD  Active   b complex vitamins capsule [id number]  Take 1 capsule by mouth daily. [provider]  Active   baclofen  (LIORESAL ) 10 MG tablet [id number]  Take 10 mg by mouth 3 (three) times daily as needed for muscle spasms. [provider]  Active Self  benzonatate  (TESSALON ) 200 MG capsule [id number]  Take 1 capsule (200 mg total) by mouth 3 (three) times daily as needed for cough. Dettinger, Fonda LABOR, MD  Active Self  budesonide -formoterol  (SYMBICORT ) 160-4.5 MCG/ACT inhaler [id number]  Inhale 2 puffs into the lungs 2 (two) times daily. Hunsucker, Donnice SAUNDERS, MD  Active   buprenorphine (SUBUTEX) 2 MG SUBL SL tablet [id number]  Place 2 mg under the tongue 2 (two) times daily. [provider]  Active   busPIRone  (BUSPAR ) 5 MG tablet [id number]  Take 1 tablet (5 mg total) by mouth 2 (two) times daily. Dettinger, Fonda LABOR, MD  Active   Cyanocobalamin  (VITAMIN B-12 PO) [id number]  Take by mouth. [provider]  Active   DULoxetine  (CYMBALTA ) 60 MG capsule [id number]  Take 1 capsule (60 mg total) by mouth daily. Dettinger, Fonda LABOR, MD  Active   enalapril  (VASOTEC ) 5 MG tablet [id number]  Take 1 tablet (5 mg total) by mouth daily.  Patient not taking: Reported on 05/29/2024   Dettinger, Fonda LABOR, MD  Active   famotidine  (PEPCID ) 20 MG tablet [id number]  Take 1 tablet (20 mg total) by mouth 2 (two) times daily. Dettinger, Fonda LABOR, MD  Active   leptospermum  manuka honey (MEDIHONEY) PSTE paste [id number]  Apply 1 Application topically daily.  Patient not taking: Reported on 05/29/2024   Dettinger, Fonda LABOR, MD  Active   levocetirizine (XYZAL ) 5 MG tablet [id number]  Take 1 tablet (5 mg total) by mouth every evening. Dettinger, Fonda LABOR, MD  Active   Multiple Vitamins-Minerals [facility] SKIN NAILS PO) [id number]  Take 3 tablets by mouth daily. [provider]  Active Self  Multiple Vitamins-Minerals (MULTIVITAMIN WITH MINERALS) tablet [id number]  Take 1 tablet by mouth daily. [provider]  Active Self  naloxone  (NARCAN ) nasal spray 4 mg/0.1 mL [id number]  Place 1 spray into the nose once. [provider]  Active Self           Med Note ([name], CHUCK KANDICE Schaumann Aug 12, 2023  7:29 PM) Never used, has at home  nystatin  (MYCOSTATIN ) 100000 UNIT/ML suspension [id number]  Take 5 mLs by mouth 4 (four) times daily as needed (thrush). [provider]  Active Self  ondansetron  (ZOFRAN ) 4 MG tablet [id number]  Take 1 tablet (4  mg total) by mouth as needed for nausea or vomiting. Take one 1 tablet every 8 hours as needed Dettinger, Fonda LABOR, MD  Active   ondansetron  (ZOFRAN -ODT) 4 MG disintegrating tablet [id number]  4mg  ODT q4 hours prn nausea/vomit Pollina, Lonni PARAS, MD  Active   Oxycodone  HCl 10 MG TABS [id number]  Take 1 tablet (10 mg total) by mouth 5 (five) times daily as needed (Take this medicine once a day in addition to the other dosing of your medicine.). Patsey Lot, MD  Active   pregabalin  (LYRICA ) 100 MG capsule [id number]  Take 100 mg by mouth 3 (three) times daily. [provider]  Active   Pyridoxine HCl (VITAMIN B-6 PO) [id number]  Take by mouth. [provider]  Active   rizatriptan  (MAXALT ) 10 MG tablet [id number]  Take 1 tablet (10 mg total) by mouth as needed for migraine. May repeat in 2 hours if needed Dettinger, Fonda LABOR, MD  Active   SANTYL  250 UNIT/GM ointment [id number]  APPLY 1 APPLICATION  TOPICALLY DAILY  Patient not taking: Reported on 05/29/2024   Dettinger, Fonda LABOR, MD  Active   sitaGLIPtin -metformin  (JANUMET ) 50-1000 MG tablet [id number]  TAKE 1 TABLET BY MOUTH TWICE A DAY WITH FOOD Dettinger, Fonda LABOR, MD  Active   Tiotropium Bromide  Monohydrate (SPIRIVA  RESPIMAT) 2.5 MCG/ACT AERS [id number]  Inhale 2 puffs into the lungs daily. Hunsucker, Donnice SAUNDERS, MD  Active   zolpidem (AMBIEN) 5 MG tablet [id number]  Take 5 mg by mouth at bedtime as needed. [provider]  Active             Recommendation:   PCP Follow-up Specialty provider follow-up Consider BH resources Continue Current Plan of Care  Follow Up Plan:   Telephone follow-up in 1 month  Lyle Rung, BSW, MSW, LCSW Licensed Clinical Social Worker [employer]   [email or web address] Direct Dial: [phone]

## 2024-06-13 NOTE — Patient Instructions (Signed)
 Visit Information  Thank you for taking time to visit with me today. Please don't hesitate to contact me if I can be of assistance to you before our next scheduled appointment.  Our next appointment is by telephone on 07/05/24 at 10 Please call the care guide team at [phone] if you need to cancel or reschedule your appointment.   Following is a copy of your care plan:   Goals Addressed             This Visit's Progress    LCSW [facility] Social Work Care Plan         Problems:   Chronic Mental Health needs related to symptoms of insomnia, stress and anxiety. GAD dx listed in chart. Patient requires Support, Education, Resources, Referrals, Advocacy, and Care Coordination, in order to meet Unmet Mental Health Needs. Social Isolation Sleep Apnea dx (in remission per pt) Patient lacks knowledge of local and available community resources and behavioral health agencies.   Clinical Goal(s): verbalize understanding of plan for management of Anxiety, Depression, Insomnia and Stress symptoms and demonstrate a reduction in symptoms. Patient will consider connecting with a provider for ongoing mental health treatment, and increase coping skills, healthy habits, self-management skills, and stress reduction      Patient will implement clinical interventions discussed today to decrease symptoms of stress and increase knowledge and/or ability of: healthy coping skills.    Clinical Interventions:  Assessed patient's previous and current treatment, coping skills, support system and barriers to care. Patient provided hx  Verbalization of feelings encouraged, motivational interviewing employed Emotional support provided, positive coping strategies explored. Establishing healthy boundaries emphasized and healthy self-care education provided Patient was educated on available mental health resources within their area that accept her insurance and offer counseling and psychiatry. Patient was advised to  contact the back of her insurance card for assistance with benefits as well. Patient educated on the difference between therapy and psychiatry per patient request. Patient reports not being interested in talk therapy.  Emotional support provided. CBT intervention implemented regarding being mentally fit by combating negative thinking and replacing it with uplifting support, hope and positivity. Patient reports taking Cymbalta  as prescribed for her symptom relief. She is also prescribed Ambien for sleep. She was successful in identifying triggers to anxiety and depression symptoms, in addition, to healthy coping skills.  Assessed social determinant of health barriers LCSW provided education on relaxation techniques such as meditation, deep breathing, massage, grounding exercises or yoga that can activate the body's relaxation response and ease symptoms of stress and anxiety. LCSW ask that when pt is struggling with difficult emotions and racing thoughts that they start this relaxation response process. LCSW provided extensive education on healthy coping skills for anxiety. SW used active and reflective listening, validated patient's feelings/concerns, and provided emotional support. LCSW provided education on healthy sleep hygiene and what that looks like. LCSW encouraged patient to implement a night time routine into their schedule that works best for them and that they are able to maintain. Advised patient to implement deep breathing/grounding/meditation/self-care exercises into their nightly routine to combat racing thoughts at night. LCSW encouraged patient to wake up at the same time each day, make their sleeping environment comfortable, exercise when able, to limit naps and to not eat or drink anything right before bed. Explained to go to bed at the same time each night and get up at the same time each morning, including on the weekends. Make sure your bedroom is quiet, dark, relaxing, and  at a  comfortable temperature. Remove electronic devices, such as TVs, computers, and smart phones, from the bedroom.    Motivational Interviewing employed Depression screen reviewed  PHQ2/ PHQ9 completed or reviewed  Mindfulness or Relaxation training provided Active listening / Reflection utilized  Advance Care and HCPOA education provided Emotional Support Provided Problem Solving /Task Center strategies reviewed Provided psychoeducation for mental health needs  Provided brief CBT  Reviewed mental health medications and discussed importance of compliance:  Quality of sleep assessed & Sleep Hygiene techniques promoted  Participation in counseling encouraged  Verbalization of feelings encouraged  Suicidal Ideation/Homicidal Ideation assessed: Patient denies SI/HI  Review resources, discussed options and provided patient information about  Mental Health Resources Inter-disciplinary care team collaboration (see longitudinal plan of care) Message sent to [facility] team and PCP regarding patient's interest in gaining additional support and guidance regarding her insomnia.   Patient Goals/Self-Care Activities: Take medications as prescribed   Attend all scheduled provider appointments Call pharmacy for medication refills 3-7 days in advance of running out of medications Perform all self care activities independently  Perform IADL's (shopping, preparing meals, housekeeping, managing finances) independently Call provider office for new concerns or questions Work with the social worker to address care coordination needs and will continue to work with the clinical team to address health care and disease management related needs call 1-800-273-TALK (toll free, 24 hour hotline) If in a crisis, CALL 988 or go to [facility] [address] [place] [phone]) Utilize healthy coping skills and supportive resources discussed Contact PCP with any questions or  concerns Keep 90 percent of counseling appointments Call your insurance provider for more information about your Enhanced Benefits  Incorporate into daily practice - relaxation techniques, deep breathing exercises, and mindfulness meditation strategies. Talk about feelings with friends, family members, spiritual advisor, etc. Contact LCSW directly [phone]), if you have questions, need assistance, or if additional social work needs are identified between now and our next scheduled telephone outreach call. Call 988 for mental health hotline/crisis line if needed (24/7 available) Try techniques to reduce symptoms of anxiety/negative thinking (deep breathing, distraction, positive self talk, etc)   Follow Up Plan:  The patient has been provided with contact information for the care management team and has been advised to call with any mental health or health related questions or concerns.  The care management team will reach out to the patient again over the next 30 business  days.   If you are experiencing a Mental Health or Behavioral Health Crisis or need someone to talk to, please call the Suicide and Crisis Lifeline: 988    Patient Goals: Initial goal         Please call the Suicide and Crisis Lifeline: 988 call the [place]  National Suicide Prevention Lifeline: 316-774-5428 or TTY: 229-413-6005 TTY 208-248-8922) to talk to a trained counselor call 1-800-273-TALK (toll free, 24 hour hotline) go to [facility] [address], [place] [phone]) call the [facility] Crisis Line: (720) 749-8708 call 911 if you are experiencing a Mental Health or Behavioral Health Crisis or need someone to talk to.  Patient verbalized understanding of Care plan and visit instructions communicated this visit  Lyle Rung, BSW, MSW, LCSW Licensed Clinical Social Worker [employer]   [facility] [email or web address] Direct Dial:  [phone]

## 2024-06-16 ENCOUNTER — Ambulatory Visit (INDEPENDENT_AMBULATORY_CARE_PROVIDER_SITE_OTHER)

## 2024-06-16 ENCOUNTER — Ambulatory Visit: Payer: Self-pay | Admitting: Family Medicine

## 2024-06-16 VITALS — BP 132/84 | HR 100 | Temp 97.8°F | Ht 64.0 in | Wt 130.4 lb

## 2024-06-16 DIAGNOSIS — I152 Hypertension secondary to endocrine disorders: Secondary | ICD-10-CM | POA: Diagnosis not present

## 2024-06-16 DIAGNOSIS — E1142 Type 2 diabetes mellitus with diabetic polyneuropathy: Secondary | ICD-10-CM

## 2024-06-16 DIAGNOSIS — E1169 Type 2 diabetes mellitus with other specified complication: Secondary | ICD-10-CM

## 2024-06-16 DIAGNOSIS — E1159 Type 2 diabetes mellitus with other circulatory complications: Secondary | ICD-10-CM | POA: Diagnosis not present

## 2024-06-16 DIAGNOSIS — M79672 Pain in left foot: Secondary | ICD-10-CM

## 2024-06-16 DIAGNOSIS — E782 Mixed hyperlipidemia: Secondary | ICD-10-CM | POA: Diagnosis not present

## 2024-06-16 DIAGNOSIS — E785 Hyperlipidemia, unspecified: Secondary | ICD-10-CM | POA: Diagnosis not present

## 2024-06-16 DIAGNOSIS — R7989 Other specified abnormal findings of blood chemistry: Secondary | ICD-10-CM | POA: Diagnosis not present

## 2024-06-16 DIAGNOSIS — J9611 Chronic respiratory failure with hypoxia: Secondary | ICD-10-CM | POA: Diagnosis not present

## 2024-06-16 DIAGNOSIS — Z23 Encounter for immunization: Secondary | ICD-10-CM | POA: Diagnosis not present

## 2024-06-16 LAB — BAYER DCA HB A1C WAIVED: HB A1C (BAYER DCA - WAIVED): 5.4 % (ref 4.8–5.6)

## 2024-06-16 NOTE — Progress Notes (Signed)
 BP 132/84   Pulse 100   Temp 97.8 F (36.6 C)   Ht 5' 4 (1.626 m)   Wt 130 lb 6.4 oz (59.1 kg)   SpO2 95% Comment: 3L O2  BMI 22.38 kg/m    Subjective:   Patient ID: Margaret Vaughn, female    DOB: May 17, 1961, 63 y.o.   MRN: [id number]  HPI: Margaret Vaughn is a 63 y.o. female presenting on 06/16/2024 for Medical Management of Chronic Issues   Discussed the use of AI scribe software for clinical note transcription with the patient, who gave verbal consent to proceed.  History of Present Illness   Margaret Vaughn is a 63 year old female with hypertension and osteoporosis who presents for a recheck of her blood pressure and medication management.  Hypertension and antihypertensive therapy - Elevated blood pressure readings over the past two months after discontinuation of antihypertensive medications - Restarted enalapril  5 mg daily - Blood pressure stable at home - Presyncope symptoms when systolic blood pressure drops below 100 mmHg  Osteoporosis - Diagnosed osteoporosis with T-score of -2.8 in the lower spine  Dyslipidemia - On atorvastatin  for cholesterol management  Chronic respiratory disease and oxygen  therapy - Uses inhalers: Symbicort , albuterol , and Spiriva  - On 3 liters of supplemental oxygen  - Recently obtained a new oxygen  concentrator, which is more effective than previous device  Coronary artery calcification and cardiac evaluation - History of coronary artery calcification - Scheduled for a stress test - Apprehensive about stress test due to previous experience of severe distress  Musculoskeletal injury - Left foot injury with persistent pain and a palpable knot for approximately one month  Viral respiratory infections and immunization consideration - Three episodes of RSV infection in the past year - Considering RSV vaccination  Recent imaging studies - Recent mammogram and low radiation lung x-ray, both reported as normal           Relevant past medical, surgical, family and social history reviewed and updated as indicated. Interim medical history since our last visit reviewed. Allergies and medications reviewed and updated.  Review of Systems  Constitutional:  Negative for chills and fever.  Eyes:  Negative for visual disturbance.  Respiratory:  Positive for cough and wheezing. Negative for chest tightness and shortness of breath.   Cardiovascular:  Negative for chest pain and leg swelling.  Musculoskeletal:  Negative for back pain and gait problem.  Skin:  Negative for rash.  Neurological:  Negative for dizziness, light-headedness and headaches.  Psychiatric/Behavioral:  Negative for agitation and behavioral problems.   All other systems reviewed and are negative.   Per HPI unless specifically indicated above   Allergies as of 06/16/2024       Reactions   Avelox [moxifloxacin] Anaphylaxis   Iohexol  Anaphylaxis   Pt given 300 for CT Angio was given 50 mg benadryl  prior to scan had no problems. Anaphylaxis?   Shellfish Allergy Hives        Medication List        Accurate as of June 16, 2024  3:08 PM. If you have any questions, ask your nurse or doctor.          albuterol  (2.5 MG/3ML) 0.083% nebulizer solution Commonly known as: PROVENTIL  Take 3 mLs (2.5 mg total) by nebulization every 6 (six) hours as needed for wheezing or shortness of breath.   albuterol  108 (90 Base) MCG/ACT inhaler Commonly known as: Ventolin  HFA INHALE 2 PUFFS BY MOUTH EVERY 6 HOURS AS NEEDED  FOR WHEEZE OR SHORTNESS OF BREATH   alendronate 70 MG tablet Commonly known as: FOSAMAX Take 70 mg by mouth once a week.   Amitiza  24 MCG capsule Generic drug: lubiprostone  TAKE 1 CAPSULE (24 MCG TOTAL) BY MOUTH 2 (TWO) TIMES DAILY WITH A MEAL. What changed: See the new instructions.   aspirin EC 81 MG tablet Take 81 mg by mouth daily. Swallow whole.   atorvastatin  40 MG tablet Commonly known as: LIPITOR Take 1  tablet (40 mg total) by mouth daily.   b complex vitamins capsule Take 1 capsule by mouth daily.   baclofen  10 MG tablet Commonly known as: LIORESAL  Take 10 mg by mouth 3 (three) times daily as needed for muscle spasms.   benzonatate  200 MG capsule Commonly known as: TESSALON  Take 1 capsule (200 mg total) by mouth 3 (three) times daily as needed for cough.   budesonide -formoterol  160-4.5 MCG/ACT inhaler Commonly known as: Symbicort  Inhale 2 puffs into the lungs 2 (two) times daily.   buprenorphine 2 MG Subl SL tablet Commonly known as: SUBUTEX Place 2 mg under the tongue 2 (two) times daily.   busPIRone  5 MG tablet Commonly known as: BUSPAR  Take 1 tablet (5 mg total) by mouth 2 (two) times daily.   DULoxetine  60 MG capsule Commonly known as: CYMBALTA  Take 1 capsule (60 mg total) by mouth daily.   enalapril  5 MG tablet Commonly known as: VASOTEC  Take 1 tablet (5 mg total) by mouth daily.   famotidine  20 MG tablet Commonly known as: PEPCID  Take 1 tablet (20 mg total) by mouth 2 (two) times daily.   HAIR SKIN NAILS PO Take 3 tablets by mouth daily.   multivitamin with minerals tablet Take 1 tablet by mouth daily.   Janumet  50-1000 MG tablet Generic drug: sitaGLIPtin -metformin  TAKE 1 TABLET BY MOUTH TWICE A DAY WITH FOOD   leptospermum manuka honey Pste paste Apply 1 Application topically daily.   levocetirizine 5 MG tablet Commonly known as: XYZAL  Take 1 tablet (5 mg total) by mouth every evening.   naloxone  4 MG/0.1ML Liqd nasal spray kit Commonly known as: NARCAN  Place 1 spray into the nose once.   nystatin  100000 UNIT/ML suspension Commonly known as: MYCOSTATIN  Take 5 mLs by mouth 4 (four) times daily as needed (thrush).   ondansetron  4 MG disintegrating tablet Commonly known as: ZOFRAN -ODT 4mg  ODT q4 hours prn nausea/vomit   ondansetron  4 MG tablet Commonly known as: ZOFRAN  Take 1 tablet (4 mg total) by mouth as needed for nausea or vomiting. Take  one 1 tablet every 8 hours as needed   Oxycodone  HCl 10 MG Tabs Take 1 tablet (10 mg total) by mouth 5 (five) times daily as needed (Take this medicine once a day in addition to the other dosing of your medicine.).   pregabalin  100 MG capsule Commonly known as: LYRICA  Take 100 mg by mouth 3 (three) times daily.   rizatriptan  10 MG tablet Commonly known as: Maxalt  Take 1 tablet (10 mg total) by mouth as needed for migraine. May repeat in 2 hours if needed   Santyl  250 UNIT/GM ointment Generic drug: collagenase  APPLY 1 APPLICATION TOPICALLY DAILY   Spiriva  Respimat 2.5 MCG/ACT Aers Generic drug: Tiotropium Bromide  Inhale 2 puffs into the lungs daily.   VITAMIN B-12 PO Take by mouth.   VITAMIN B-6 PO Take by mouth.   zolpidem 5 MG tablet Commonly known as: AMBIEN Take 5 mg by mouth at bedtime as needed.  Objective:   BP 132/84   Pulse 100   Temp 97.8 F (36.6 C)   Ht 5' 4 (1.626 m)   Wt 130 lb 6.4 oz (59.1 kg)   SpO2 95% Comment: 3L O2  BMI 22.38 kg/m   Wt Readings from Last 3 Encounters:  06/16/24 130 lb 6.4 oz (59.1 kg)  03/24/24 134 lb (60.8 kg)  02/25/24 137 lb (62.1 kg)    Physical Exam Vitals and nursing note reviewed.  Constitutional:      Appearance: Normal appearance.  Musculoskeletal:       Feet:  Neurological:     Mental Status: She is alert.    Physical Exam   VITALS: BP- 132/84 CHEST: Wheezing in the posterior lungs. CARDIOVASCULAR: Heart sounds regular.         Assessment & Plan:   Problem List Items Addressed This Visit       Cardiovascular and Mediastinum   Hypertension associated with diabetes (HCC)     Respiratory   Chronic respiratory failure with hypoxia (HCC)     Endocrine   Hyperlipidemia associated with type 2 diabetes mellitus (HCC)   Mixed diabetic hyperlipidemia associated with type 2 diabetes mellitus (HCC)   Type 2 diabetes mellitus with diabetic polyneuropathy, without long-term current use of  insulin  (HCC) - Primary   Relevant Orders   Bayer DCA Hb A1c Waived   Other Visit Diagnoses       Elevated liver function tests       Relevant Orders   Hepatic function panel     Encounter for immunization       Relevant Orders   Flu vaccine trivalent PF, 6mos and older(Flulaval,Afluria,Fluarix,Fluzone) (Completed)     Left foot pain       Relevant Orders   DG Foot Complete Left         Hypertension Blood pressure controlled at 132/84 mmHg with enalapril  5 mg daily. - Continue enalapril  5 mg daily. - Monitor blood pressure at home. Report if systolic >150 mmHg or <110 mmHg.  Chronic respiratory failure with hypoxia Managed with 3 liters of oxygen . Satisfied with current oxygen  concentrator. - Continue 3 liters of oxygen  therapy.  Hyperlipidemia Managed with atorvastatin  without issues. - Continue atorvastatin  as prescribed.  Osteoporosis of lumbar spine Confirmed with T-score of -2.8. Discussed risk of compression fractures and posture impact.  Left foot pain, chronic Chronic pain for a month on the outside of the left foot with a persistent knot. - Took a picture of the left foot for further evaluation.  General Health Maintenance Discussed RSV and pneumonia vaccinations. High risk for RSV, had it three times last year. - Recommended RSV vaccine, available at a pharmacy.          Follow up plan: Return in about 3 months (around 09/16/2024), or if symptoms worsen or fail to improve, for Diabetes.  Counseling provided for all of the vaccine components Orders Placed This Encounter  Procedures   DG Foot Complete Left   Flu vaccine trivalent PF, 6mos and older(Flulaval,Afluria,Fluarix,Fluzone)   Bayer DCA Hb A1c Waived   Hepatic function panel    Fonda Levins, MD [facility] 06/16/2024, 3:08 PM

## 2024-06-17 LAB — HEPATIC FUNCTION PANEL
ALT: 17 IU/L (ref 0–32)
AST: 21 IU/L (ref 0–40)
Albumin: 3.8 g/dL — ABNORMAL LOW (ref 3.9–4.9)
Alkaline Phosphatase: 102 IU/L (ref 49–135)
Bilirubin Total: <0.2 mg/dL (ref 0.0–1.2)
Bilirubin, Direct: <0.08 mg/dL (ref 0.00–0.40)
Total Protein: 6.1 g/dL (ref 6.0–8.5)

## 2024-06-18 ENCOUNTER — Other Ambulatory Visit: Payer: Self-pay | Admitting: Medical Genetics

## 2024-06-19 ENCOUNTER — Ambulatory Visit: Payer: Self-pay | Admitting: Family Medicine

## 2024-06-19 DIAGNOSIS — S92352G Displaced fracture of fifth metatarsal bone, left foot, subsequent encounter for fracture with delayed healing: Secondary | ICD-10-CM

## 2024-06-21 ENCOUNTER — Encounter: Payer: Self-pay | Admitting: Family Medicine

## 2024-06-28 ENCOUNTER — Other Ambulatory Visit: Payer: Self-pay | Admitting: *Deleted

## 2024-06-28 NOTE — Patient Instructions (Signed)
 Visit Information  Margaret Vaughn was given information about Medicaid Managed Care team care coordination services as a part of their [employer] Medicaid benefit. Margaret Vaughn   If you would like to schedule transportation through your [facility] plan, please call the following number at least 2 days in advance of your appointment: [phone]  For information about your ride after you set it up, call Ride Assist at [phone]. Use this number to activate a Will Call pickup, or if your transportation is late for a scheduled pickup. Use this number, too, if you need to make a change or cancel a previously scheduled reservation.  If you need transportation services right away, call [phone]. The after-hours call center is staffed 24 hours to handle ride assistance and urgent reservation requests (including discharges) 365 days a year. Urgent trips include sick visits, hospital discharge requests and life-sustaining treatment.  Call the [facility] Line at [phone], at any time, 24 hours a day, 7 days a week. If you are in danger or need immediate medical attention call 911.   Please see education materials related to COPD, Smoking Cessation provided by MyChart link.  Patient verbalizes understanding of instructions and care plan provided today and agrees to view in MyChart. Active MyChart status and patient understanding of how to access instructions and care plan via MyChart confirmed with patient.     Telephone follow up appointment with Managed Medicaid care management team member scheduled for:08-01-2024 at 10:00 am  Rosina Forte, BSN RN [facility], [facility] RN Care Manager Direct Dial: [phone]  Fax: [phone]   Following is a copy of your plan of care:  There are no care plans that you recently modified to display for this patient.

## 2024-06-28 NOTE — Patient Outreach (Signed)
 Complex Care Management   Visit Note  06/28/2024  Name:  Margaret Vaughn MRN: [id number] DOB: May 09, 1961  Situation: Referral received for Complex Care Management related to COPD I obtained verbal consent from Patient.  Visit completed with Patient  on the phone  Background:   Past Medical History:  Diagnosis Date   Acid reflux    Allergies    Anemia    Asthma    Bronchitis    Cervical stenosis of spine    Chronic pain syndrome    Complication of anesthesia    difficulty waking up from anesthesia   COPD (chronic obstructive pulmonary disease) (HCC)    DDD (degenerative disc disease), lumbar    Depression with anxiety    Diabetes mellitus without complication (HCC)    Dyspnea    Dysrhythmia    PSVT   Fatigue    H/O blood clots    H/O echocardiogram 1989   Per pt, done @ [facility]- Showed mitral valve prolapse   Headache(784.0)    Heart murmur    Hemorrhoids, external    History of hiatal hernia    History of kidney stones    History of PSVT (paroxysmal supraventricular tachycardia)    HTN (hypertension)    Hyperlipidemia    Hypoxemia    Insomnia    Memory loss    Myalgia and myositis    Narcolepsy    On home O2    2-4 L @ HS   OSA (obstructive sleep apnea)    Pneumonia    Sleeping difficulty    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HCC)    Thoracalgia    Tobacco abuse    Tremor    Ulcer disease     Assessment: Patient Reported Symptoms:  Cognitive Cognitive Status: No symptoms reported Cognitive/Intellectual Conditions Management [RPT]: None reported or documented in medical history or problem list   Health Maintenance Behaviors: Annual physical exam Healing Pattern: Slow Health Facilitated by: Rest  Neurological Neurological Review of Symptoms: Dizziness Neurological Management Strategies: Routine screening Neurological Self-Management Outcome: 3 (uncertain)  HEENT HEENT Symptoms Reported: Other: HEENT Self-Management Outcome: 3 (uncertain)     Cardiovascular Cardiovascular Symptoms Reported: Swelling in legs or feet, Dizziness Does patient have uncontrolled Hypertension?: Yes Is patient checking Blood Pressure at home?: No Cardiovascular Management Strategies: Medication therapy  Respiratory Respiratory Symptoms Reported: Productive cough, Wheezing Respiratory Management Strategies: Oxygen  therapy, Routine screening  Endocrine Endocrine Symptoms Reported: No symptoms reported Is patient diabetic?: No Is patient checking blood sugars at home?: No Endocrine Self-Management Outcome: 4 (good)  Gastrointestinal Gastrointestinal Symptoms Reported: Nausea, Constipation Gastrointestinal Self-Management Outcome: 4 (good)    Genitourinary Genitourinary Symptoms Reported: Difficulty initiating stream Genitourinary Self-Management Outcome: 3 (uncertain)  Integumentary Integumentary Symptoms Reported: Bruising Skin Management Strategies: Routine screening Skin Self-Management Outcome: 4 (good)  Musculoskeletal Musculoskelatal Symptoms Reviewed: Weakness, Unsteady gait, Back pain, Limited mobility, Difficulty walking, Joint pain Musculoskeletal Management Strategies: Routine screening Musculoskeletal Self-Management Outcome: 3 (uncertain) Falls in the past year?: Yes Number of falls in past year: 2 or more Was there an injury with Fall?: Yes Fall Risk Category Calculator: 3 Patient Fall Risk Level: High Fall Risk Patient at Risk for Falls Due to: History of fall(s), Impaired balance/gait Fall risk Follow up: Falls evaluation completed, Education provided, Falls prevention discussed  Psychosocial Psychosocial Symptoms Reported: No symptoms reported Behavioral Management Strategies: Coping strategies Behavioral Health Self-Management Outcome: 3 (uncertain) Major Change/Loss/Stressor/Fears (CP): Medical condition, self Techniques to Cope with Loss/Stress/Change: Medication      06/28/2024  PHQ2-9 Depression Screening   Little  interest or pleasure in doing things Not at all  Feeling down, depressed, or hopeless Several days  PHQ-2 - Total Score 1  Trouble falling or staying asleep, or sleeping too much    Feeling tired or having little energy    Poor appetite or overeating     Feeling bad about yourself - or that you are a failure or have let yourself or your family down    Trouble concentrating on things, such as reading the newspaper or watching television    Moving or speaking so slowly that other people could have noticed.  Or the opposite - being so fidgety or restless that you have been moving around a lot more than usual    Thoughts that you would be better off dead, or hurting yourself in some way    PHQ2-9 Total Score    If you checked off any problems, how difficult have these problems made it for you to do your work, take care of things at home, or get along with other people    Depression Interventions/Treatment      There were no vitals filed for this visit. Pain Scale: 0-10 Pain Score: 8  Pain Type: Chronic pain Pain Location: Generalized Pain Intervention(s): Medication (See eMAR)  Medications Reviewed Today     Reviewed by Bertrum Rosina HERO, RN (Registered Nurse) on 06/28/24 at 1014  Med List Status: <None>   Medication Order Taking? Sig Documenting Provider Last Dose Status Informant  albuterol  (PROVENTIL ) (2.5 MG/3ML) 0.083% nebulizer solution [id number] Yes Take 3 mLs (2.5 mg total) by nebulization every 6 (six) hours as needed for wheezing or shortness of breath. Zelaya, Oscar A, PA-C  Active   albuterol  (VENTOLIN  HFA) 108 (90 Base) MCG/ACT inhaler [id number] Yes INHALE 2 PUFFS BY MOUTH EVERY 6 HOURS AS NEEDED FOR WHEEZE OR SHORTNESS OF BREATH Dettinger, Fonda LABOR, MD  Active   alendronate (FOSAMAX) 70 MG tablet [id number]  Take 70 mg by mouth once a week. [provider]  Active   AMITIZA  24 MCG capsule [id number]  TAKE 1 CAPSULE (24 MCG TOTAL) BY MOUTH 2 (TWO) TIMES DAILY WITH A  MEAL.  Patient taking differently: as needed.   Kennedy Charmaine CROME, NP  Active Self           Med Note (ROBB, MELANIE A   Mon Aug 23, 2023  2:09 PM) Taking as needed  aspirin EC 81 MG tablet [id number] Yes Take 81 mg by mouth daily. Swallow whole. [provider]  Active Self           Med Note SEABRON, LESLIE M   Mon Nov 22, 2023  3:56 PM)    atorvastatin  (LIPITOR) 40 MG tablet [id number] Yes Take 1 tablet (40 mg total) by mouth daily. Dettinger, Fonda LABOR, MD  Active   b complex vitamins capsule [id number]  Take 1 capsule by mouth daily. [provider]  Active   baclofen  (LIORESAL ) 10 MG tablet [id number] Yes Take 10 mg by mouth 3 (three) times daily as needed for muscle spasms. [provider]  Active Self  benzonatate  (TESSALON ) 200 MG capsule [id number] Yes Take 1 capsule (200 mg total) by mouth 3 (three) times daily as needed for cough. Dettinger, Fonda LABOR, MD  Active Self  budesonide -formoterol  (SYMBICORT ) 160-4.5 MCG/ACT inhaler [id number] Yes Inhale 2 puffs into the lungs 2 (two) times daily. Hunsucker, Donnice SAUNDERS, MD  Active   buprenorphine (SUBUTEX) 2 MG SUBL SL  tablet [id number] Yes Place 2 mg under the tongue 2 (two) times daily. [provider]  Active   busPIRone  (BUSPAR ) 5 MG tablet [id number] Yes Take 1 tablet (5 mg total) by mouth 2 (two) times daily. Dettinger, Fonda LABOR, MD  Active   Cyanocobalamin  (VITAMIN B-12 PO) [id number] Yes Take by mouth. [provider]  Active   DULoxetine  (CYMBALTA ) 60 MG capsule [id number] Yes Take 1 capsule (60 mg total) by mouth daily. Dettinger, Fonda LABOR, MD  Active   enalapril  (VASOTEC ) 5 MG tablet [id number] Yes Take 1 tablet (5 mg total) by mouth daily. Dettinger, Fonda LABOR, MD  Active   famotidine  (PEPCID ) 20 MG tablet [id number] Yes Take 1 tablet (20 mg total) by mouth 2 (two) times daily. Dettinger, Fonda LABOR, MD  Active   leptospermum manuka honey (MEDIHONEY) PSTE paste [id number]  Apply 1 Application topically  daily.  Patient not taking: Reported on 05/29/2024   Dettinger, Fonda LABOR, MD  Active   levocetirizine (XYZAL ) 5 MG tablet [id number] Yes Take 1 tablet (5 mg total) by mouth every evening. Dettinger, Fonda LABOR, MD  Active   Multiple Vitamins-Minerals [facility] SKIN NAILS PO) [id number] Yes Take 3 tablets by mouth daily. [provider]  Active Self  Multiple Vitamins-Minerals (MULTIVITAMIN WITH MINERALS) tablet [id number] Yes Take 1 tablet by mouth daily. [provider]  Active Self  naloxone  (NARCAN ) nasal spray 4 mg/0.1 mL [id number] Yes Place 1 spray into the nose once. [provider]  Active Self           Med Note ([name], CHUCK KANDICE Schaumann Aug 12, 2023  7:29 PM) Never used, has at home  nystatin  (MYCOSTATIN ) 100000 UNIT/ML suspension [id number] Yes Take 5 mLs by mouth 4 (four) times daily as needed (thrush). [provider]  Active Self  ondansetron  (ZOFRAN ) 4 MG tablet [id number] Yes Take 1 tablet (4 mg total) by mouth as needed for nausea or vomiting. Take one 1 tablet every 8 hours as needed Dettinger, Fonda LABOR, MD  Active   ondansetron  (ZOFRAN -ODT) 4 MG disintegrating tablet [id number] Yes 4mg  ODT q4 hours prn nausea/vomit Pollina, Lonni PARAS, MD  Active   Oxycodone  HCl 10 MG TABS [id number] Yes Take 1 tablet (10 mg total) by mouth 5 (five) times daily as needed (Take this medicine once a day in addition to the other dosing of your medicine.). Patsey Lot, MD  Active   pregabalin  (LYRICA ) 100 MG capsule [id number] Yes Take 100 mg by mouth 3 (three) times daily. [provider]  Active   Pyridoxine HCl (VITAMIN B-6 PO) [id number]  Take by mouth. [provider]  Active   rizatriptan  (MAXALT ) 10 MG tablet [id number] Yes Take 1 tablet (10 mg total) by mouth as needed for migraine. May repeat in 2 hours if needed Dettinger, Fonda LABOR, MD  Active   SANTYL  250 UNIT/GM ointment [id number]  APPLY 1 APPLICATION TOPICALLY DAILY  Patient not taking: Reported  on 05/29/2024   Dettinger, Fonda LABOR, MD  Active   sitaGLIPtin -metformin  (JANUMET ) 50-1000 MG tablet [id number] Yes TAKE 1 TABLET BY MOUTH TWICE A DAY WITH FOOD Dettinger, Fonda LABOR, MD  Active   Tiotropium Bromide  Monohydrate (SPIRIVA  RESPIMAT) 2.5 MCG/ACT AERS [id number] Yes Inhale 2 puffs into the lungs daily. Hunsucker, Donnice SAUNDERS, MD  Active   zolpidem (AMBIEN) 5 MG tablet [id number] Yes Take 5 mg by mouth at bedtime as needed. [provider]  Active  Recommendation:   Continue Current Plan of Care  Follow Up Plan:   Telephone follow-up in 1 month  Rosina Forte, BSN RN [facility], [facility] RN Care Manager Direct Dial: [phone]  Fax: [phone]

## 2024-06-29 ENCOUNTER — Ambulatory Visit

## 2024-06-29 DIAGNOSIS — S92352G Displaced fracture of fifth metatarsal bone, left foot, subsequent encounter for fracture with delayed healing: Secondary | ICD-10-CM

## 2024-06-29 NOTE — Progress Notes (Signed)
 Patient came in to be fitted with a walking boot. Fittiing went well. Patient tolerated well.

## 2024-07-05 ENCOUNTER — Ambulatory Visit: Admitting: Surgical

## 2024-07-05 ENCOUNTER — Telehealth: Payer: Self-pay | Admitting: Licensed Clinical Social Worker

## 2024-07-05 ENCOUNTER — Encounter: Payer: Self-pay | Admitting: Licensed Clinical Social Worker

## 2024-07-05 ENCOUNTER — Other Ambulatory Visit: Payer: Self-pay

## 2024-07-05 DIAGNOSIS — M79672 Pain in left foot: Secondary | ICD-10-CM

## 2024-07-05 NOTE — Patient Instructions (Signed)
 Tawni ONEIDA Coder - I am sorry I was unable to reach you today for our scheduled appointment. I work with Dettinger, Fonda LABOR, MD and am calling to support your healthcare needs. Please contact me at [phone] at your earliest convenience. I look forward to speaking with you soon.   Thank you,  Lyle Rung, BSW, MSW, LCSW Licensed Clinical Social Worker [employer]   [facility] [email or web address] Direct Dial: [phone]

## 2024-07-10 ENCOUNTER — Ambulatory Visit: Admitting: Orthopedic Surgery

## 2024-07-10 DIAGNOSIS — E119 Type 2 diabetes mellitus without complications: Secondary | ICD-10-CM | POA: Diagnosis not present

## 2024-07-10 DIAGNOSIS — M8000XA Age-related osteoporosis with current pathological fracture, unspecified site, initial encounter for fracture: Secondary | ICD-10-CM

## 2024-07-10 DIAGNOSIS — S92355A Nondisplaced fracture of fifth metatarsal bone, left foot, initial encounter for closed fracture: Secondary | ICD-10-CM | POA: Diagnosis not present

## 2024-07-10 DIAGNOSIS — I2510 Atherosclerotic heart disease of native coronary artery without angina pectoris: Secondary | ICD-10-CM | POA: Diagnosis not present

## 2024-07-11 ENCOUNTER — Encounter: Payer: Self-pay | Admitting: Orthopedic Surgery

## 2024-07-11 NOTE — Progress Notes (Signed)
 Office Visit Note   Patient: Margaret Vaughn           Date of Birth: [birth year]           MRN: [id number] Visit Date: 07/10/2024              Requested by: Dettinger, Fonda LABOR, MD [address] [place],  [place] [address] PCP: Dettinger, Fonda LABOR, MD  Chief Complaint  Patient presents with   Left Foot - Fracture      HPI: Discussed the use of AI scribe software for clinical note transcription with the patient, who gave verbal consent to proceed.  History of Present Illness Margaret Vaughn is a 63 year old female with diabetes who presents with a fracture of the fifth metatarsal of the left foot.  She sustained a fracture to the base of the fifth metatarsal on her left foot when a nightstand fell on it. The fracture is located on the lateral aspect of her foot, accompanied by swelling and ecchymosis. Initial radiographs on November 21st confirmed the fracture, and follow-up radiographs on December 10th showed no change.  She has diabetes, with a recent A1c of 5.2. She is currently taking Janumet  for diabetes management. Additionally, she has recently started on Fosamax for osteoporosis.  She smokes.     Assessment & Plan: Visit Diagnoses:  1. Closed nondisplaced fracture of fifth metatarsal bone of left foot, initial encounter   2. Age-related osteoporosis with current pathological fracture, initial encounter     Plan: Assessment and Plan Assessment & Plan Metaphyseal fracture of the base of the fifth metatarsal, left foot Metaphyseal fracture confirmed by radiographs. Favorable for healing. Good glycemic control with A1c of 5.2. Smoking cessation advised to prevent delayed healing. Possible surgical intervention if nonunion occurs in 3-4 months, with increased infection risk if smoking persists. - Advised weight bearing as tolerated in fracture boot. - Instructed no weight bearing when boot is off. - Recommended smoking cessation. - Scheduled follow-up in four weeks  with repeat three-view radiographs of the left foot.      Follow-Up Instructions: No follow-ups on file.   Ortho Exam  Patient is alert, oriented, no adenopathy, well-dressed, normal affect, normal respiratory effort. Physical Exam CARDIOVASCULAR: Strong palpable dorsal pedis and posterior tibial pulse. MUSCULOSKELETAL: Ecchymosis and bruising at the base of the fifth metatarsal, tender to palpation.  I have reviewed the patient's history and given the presence of a fragility fracture, I have deemed the necessity of a osteoporosis management referral or confirmed that the patient is currently enrolled in a osteoporosis treatment program.       Imaging: No results found. No images are attached to the encounter.  Labs: Lab Results  Component Value Date   HGBA1C 5.4 06/16/2024   HGBA1C 5.4 02/25/2024   HGBA1C 5.3 07/16/2023   REPTSTATUS 12/22/2018 FINAL 12/21/2018   CULT (A) 12/21/2018    <10,000 COLONIES/mL INSIGNIFICANT GROWTH Performed at [facility], [address] [place], [place] [address]      Lab Results  Component Value Date   ALBUMIN 3.8 (L) 06/16/2024   ALBUMIN 3.9 03/24/2024   ALBUMIN 3.7 (L) 02/25/2024    Lab Results  Component Value Date   MG 1.7 09/10/2023   Lab Results  Component Value Date   VD25OH 52.5 03/20/2022    No results found for: PREALBUMIN    Latest Ref Rng & Units 02/25/2024    3:02 PM 02/18/2024    4:17 PM  10/15/2023    3:53 PM  CBC EXTENDED  WBC 3.4 - 10.8 x10E3/uL 8.1  8.6  8.6   RBC 3.77 - 5.28 x10E6/uL 4.20  4.24  3.94   Hemoglobin 11.1 - 15.9 g/dL 87.4  87.8  88.5   HCT 34.0 - 46.6 % 38.7  39.4  36.6   Platelets 150 - 450 x10E3/uL 386  354  411   NEUT# 1.4 - 7.0 x10E3/uL 5.8  5.9  5.9   Lymph# 0.7 - 3.1 x10E3/uL 1.3  1.4  1.4      There is no height or weight on file to calculate BMI.  Orders:  No orders of the defined types were placed in this encounter.  No orders of the defined types were placed in this  encounter.    Procedures: No procedures performed  Clinical Data: No additional findings.  ROS:  All other systems negative, except as noted in the HPI. Review of Systems  Objective: Vital Signs: There were no vitals taken for this visit.  Specialty Comments:  No specialty comments available.  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   Myelopathy (HCC) 03/10/2021   HNP (herniated nucleus pulposus) with myelopathy, cervical 12/18/2020   Cervical myelopathy (HCC) 08/13/2020   Cervical spinal cord compression (HCC) 08/08/2020   Chronic respiratory failure with hypoxia (HCC) 08/08/2020   Type 2 diabetes mellitus with diabetic polyneuropathy, without long-term current use of insulin  (HCC) 08/08/2020   Fibromyalgia 01/06/2019   Neuropathy 01/06/2019   Overweight 03/11/2018   COPD (chronic obstructive pulmonary disease) (HCC) 03/11/2018   Mixed diabetic hyperlipidemia associated with type 2 diabetes mellitus (HCC) 03/11/2018   PTSD (post-traumatic stress disorder) 03/11/2018   Myofascial pain 09/23/2017   GAD (generalized anxiety disorder) 01/30/2014   Hypertension associated with diabetes (HCC) 01/30/2014   OSA (obstructive sleep apnea) 11/23/2013   Dysthymic disorder 03/22/2013   Memory loss 03/22/2013   Hyperlipidemia associated with type 2 diabetes mellitus (HCC) 08/10/2012   Tobacco abuse 08/10/2012   GERD 12/24/2009   Past Medical History:  Diagnosis Date   Acid reflux    Allergies    Anemia    Asthma    Bronchitis    Cervical stenosis of spine    Chronic pain syndrome    Complication of anesthesia    difficulty waking up from anesthesia   COPD (chronic obstructive pulmonary disease) (HCC)    DDD (degenerative disc disease), lumbar    Depression with anxiety    Diabetes mellitus without complication (HCC)     Dyspnea    Dysrhythmia    PSVT   Fatigue    H/O blood clots    H/O echocardiogram 1989   Per pt, done @ [facility]- Showed mitral valve prolapse   Headache(784.0)    Heart murmur    Hemorrhoids, external    History of hiatal hernia    History of kidney stones    History of PSVT (paroxysmal supraventricular tachycardia)    HTN (hypertension)    Hyperlipidemia    Hypoxemia    Insomnia    Memory loss    Myalgia and myositis    Narcolepsy    On home O2    2-4 L @ HS   OSA (obstructive sleep apnea)    Pneumonia    Sleeping difficulty    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HCC)    Thoracalgia  Tobacco abuse    Tremor    Ulcer disease     Family History  Problem Relation Age of Onset   Ulcers Mother    Sleep apnea Mother    Migraines Mother    Cancer Mother        uterine   Arthritis Mother    Hypertension Mother    Heart disease Mother    Depression Mother    Hyperlipidemia Mother    Colon cancer Mother    Arrhythmia Son     Past Surgical History:  Procedure Laterality Date   ABDOMINAL HYSTERECTOMY     ANTERIOR CERVICAL DECOMP/DISCECTOMY FUSION N/A 12/18/2020   Procedure: CERVICAL TWO-THREE ANTERIOR CERVICAL DISCECTOMY/DECOMPRESSION FUSION;  Surgeon: Onetha Kuba, MD;  Location: [facility] OR;  Service: Neurosurgery;  Laterality: N/A;   APPENDECTOMY  1992   BIOPSY  07/01/2022   Procedure: BIOPSY;  Surgeon: Shaaron Lamar HERO, MD;  Location: [facility];  Service: Endoscopy;;   CHOLECYSTECTOMY  1992   COLONOSCOPY WITH PROPOFOL  N/A 07/01/2022   Procedure: COLONOSCOPY WITH PROPOFOL ;  Surgeon: Shaaron Lamar HERO, MD;  Location: [facility];  Service: Endoscopy;  Laterality: N/A;  12:00 pm   DIAGNOSTIC LAPAROSCOPY  1992   lap chole   ESOPHAGOGASTRODUODENOSCOPY (EGD) WITH PROPOFOL  N/A 07/01/2022   Procedure: ESOPHAGOGASTRODUODENOSCOPY (EGD) WITH PROPOFOL ;  Surgeon: Shaaron Lamar HERO, MD;  Location: [facility];  Service: Endoscopy;  Laterality: N/A;   HEMOSTASIS CLIP PLACEMENT  07/01/2022    Procedure: HEMOSTASIS CLIP PLACEMENT;  Surgeon: Shaaron Lamar HERO, MD;  Location: [facility];  Service: Endoscopy;;   HERNIA REPAIR     umbilical... as a child   MALONEY DILATION  07/01/2022   Procedure: MALONEY DILATION;  Surgeon: Shaaron Lamar HERO, MD;  Location: [facility];  Service: Endoscopy;;   OOPHORECTOMY     POLYPECTOMY  07/01/2022   Procedure: POLYPECTOMY;  Surgeon: Shaaron Lamar HERO, MD;  Location: [facility];  Service: Endoscopy;;   POSTERIOR CERVICAL FUSION/FORAMINOTOMY N/A 08/07/2020   Procedure: POSTERIOR CERVICAL LAMINECTOMY FOR MYELOPATHY;  Surgeon: Onetha Kuba, MD;  Location[facility] OR;  Service: Neurosurgery;  Laterality: N/A;   POSTERIOR CERVICAL FUSION/FORAMINOTOMY N/A 03/10/2021   Procedure: Posterior cervical fusion with lateral mass fixation Cervical one - cervical four;  Surgeon: Onetha Kuba, MD;  Location: [facility] OR;  Service: Neurosurgery;  Laterality: N/A;   POSTERIOR CERVICAL LAMINECTOMY  08/07/2020   w/ Dr. Onetha   tonsillectomy     TONSILLECTOMY     TUBAL LIGATION     Social History   Occupational History   Occupation: disabled    Employer: UNEMPLOYED  Tobacco Use   Smoking status: Every Day    Current packs/day: 2.00    Average packs/day: 2.0 packs/day for 30.0 years (60.0 ttl pk-yrs)    Types: Cigarettes   Smokeless tobacco: Never   Tobacco comments:    Pt states she is down to 1 ppd. 06/22/2022   Vaping Use   Vaping status: Never Used  Substance and Sexual Activity   Alcohol  use: No   Drug use: No   Sexual activity: Yes    Birth control/protection: Surgical

## 2024-07-14 DIAGNOSIS — F17210 Nicotine dependence, cigarettes, uncomplicated: Secondary | ICD-10-CM | POA: Diagnosis not present

## 2024-07-14 DIAGNOSIS — G4700 Insomnia, unspecified: Secondary | ICD-10-CM | POA: Diagnosis not present

## 2024-07-14 DIAGNOSIS — R110 Nausea: Secondary | ICD-10-CM | POA: Diagnosis not present

## 2024-07-14 DIAGNOSIS — Z79899 Other long term (current) drug therapy: Secondary | ICD-10-CM | POA: Diagnosis not present

## 2024-07-14 DIAGNOSIS — E119 Type 2 diabetes mellitus without complications: Secondary | ICD-10-CM | POA: Diagnosis not present

## 2024-07-14 DIAGNOSIS — G629 Polyneuropathy, unspecified: Secondary | ICD-10-CM | POA: Diagnosis not present

## 2024-07-14 DIAGNOSIS — R030 Elevated blood-pressure reading, without diagnosis of hypertension: Secondary | ICD-10-CM | POA: Diagnosis not present

## 2024-07-14 DIAGNOSIS — Z6822 Body mass index (BMI) 22.0-22.9, adult: Secondary | ICD-10-CM | POA: Diagnosis not present

## 2024-07-14 DIAGNOSIS — M4802 Spinal stenosis, cervical region: Secondary | ICD-10-CM | POA: Diagnosis not present

## 2024-07-14 DIAGNOSIS — G950 Syringomyelia and syringobulbia: Secondary | ICD-10-CM | POA: Diagnosis not present

## 2024-07-16 ENCOUNTER — Encounter: Payer: Self-pay | Admitting: Surgical

## 2024-07-16 NOTE — Progress Notes (Signed)
 "  Office Visit Note   Patient: Margaret Vaughn           Date of Birth: August 07, 1960           MRN: [id number] Visit Date: 07/05/2024 Requested by: Dettinger, Fonda LABOR, MD [address] [place],  [place] [address] PCP: Dettinger, Fonda LABOR, MD  Subjective: Chief Complaint  Patient presents with   Foot Injury    Clemens backwards dropped a heavy night stand on her left foot this happened several weeks ago saw pcp first said it wasn't a fracture then afterseveral weeks of continued swelling and pain and final read of xray was called back and said there was a fracture    HPI: Margaret Vaughn is a 63 y.o. female who presents to the office reporting left foot pain.  Sustained a fall several weeks ago with dropping a heavy nightstand directly on her left foot.  She has been trying to keep nonweightbearing.  Has been hopping.  Does have history of prior fibular fracture that did result in union.  Does have a history of diabetes and osteoporosis for which she takes Fosamax.  No other injury to the foot..                ROS: All systems reviewed are negative as they relate to the chief complaint within the history of present illness.  Patient denies fevers or chills.  Assessment & Plan: Visit Diagnoses:  1. Pain in left foot     Plan: Impression is 63 year old female who is here for evaluation of left fifth metatarsal fracture.  She has fracture of the base of the fifth metatarsal with plantar gapping which is poor prognostic indicator in terms of healing and avoiding nonunion.  Plan for further evaluation with Dr. Duda for his expert analysis and discussion on treatment with her.  Follow-Up Instructions: No follow-ups on file.   Orders:  Orders Placed This Encounter  Procedures   DG Foot Complete Left   No orders of the defined types were placed in this encounter.     Procedures: No procedures performed   Clinical Data: No additional findings.  Objective: Vital Signs: There were no  vitals taken for this visit.  Physical Exam:  Constitutional: Patient appears well-developed HEENT:  Head: Normocephalic Eyes:EOM are normal Neck: Normal range of motion Cardiovascular: Normal rate Pulmonary/chest: Effort normal Neurologic: Patient is alert Skin: Skin is warm Psychiatric: Patient has normal mood and affect  Ortho Exam: Ortho exam demonstrates left foot with palpable DP pulse rated 1+.  Intact dorsiflexion and plantarflexion.  Stable to anterior drawer of the ankle and stressing syndesmosis.  Does have tenderness over the fifth metatarsal at the site of her fracture.  No other tenderness throughout the left foot.  No plantar ecchymosis.  Specialty Comments:  No specialty comments available.  Imaging: No results found.   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   Myelopathy (HCC) 03/10/2021   HNP (herniated nucleus pulposus) with myelopathy, cervical 12/18/2020   Cervical myelopathy (HCC) 08/13/2020   Cervical spinal cord compression (HCC) 08/08/2020   Chronic respiratory failure with hypoxia (HCC) 08/08/2020   Type 2 diabetes mellitus with diabetic polyneuropathy, without long-term current use of insulin  (HCC) 08/08/2020   Fibromyalgia 01/06/2019   Neuropathy 01/06/2019   Overweight 03/11/2018   COPD (chronic obstructive pulmonary disease) (HCC) 03/11/2018  Mixed diabetic hyperlipidemia associated with type 2 diabetes mellitus (HCC) 03/11/2018   PTSD (post-traumatic stress disorder) 03/11/2018   Myofascial pain 09/23/2017   GAD (generalized anxiety disorder) 01/30/2014   Hypertension associated with diabetes (HCC) 01/30/2014   OSA (obstructive sleep apnea) 11/23/2013   Dysthymic disorder 03/22/2013   Memory loss 03/22/2013   Hyperlipidemia associated with type 2 diabetes mellitus (HCC) 08/10/2012   Tobacco  abuse 08/10/2012   GERD 12/24/2009   Past Medical History:  Diagnosis Date   Acid reflux    Allergies    Anemia    Asthma    Bronchitis    Cervical stenosis of spine    Chronic pain syndrome    Complication of anesthesia    difficulty waking up from anesthesia   COPD (chronic obstructive pulmonary disease) (HCC)    DDD (degenerative disc disease), lumbar    Depression with anxiety    Diabetes mellitus without complication (HCC)    Dyspnea    Dysrhythmia    PSVT   Fatigue    H/O blood clots    H/O echocardiogram 1989   Per pt, done @ [facility]- Showed mitral valve prolapse   Headache(784.0)    Heart murmur    Hemorrhoids, external    History of hiatal hernia    History of kidney stones    History of PSVT (paroxysmal supraventricular tachycardia)    HTN (hypertension)    Hyperlipidemia    Hypoxemia    Insomnia    Memory loss    Myalgia and myositis    Narcolepsy    On home O2    2-4 L @ HS   OSA (obstructive sleep apnea)    Pneumonia    Sleeping difficulty    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HCC)    Thoracalgia    Tobacco abuse    Tremor    Ulcer disease     Family History  Problem Relation Age of Onset   Ulcers Mother    Sleep apnea Mother    Migraines Mother    Cancer Mother        uterine   Arthritis Mother    Hypertension Mother    Heart disease Mother    Depression Mother    Hyperlipidemia Mother    Colon cancer Mother    Arrhythmia Son     Past Surgical History:  Procedure Laterality Date   ABDOMINAL HYSTERECTOMY     ANTERIOR CERVICAL DECOMP/DISCECTOMY FUSION N/A 12/18/2020   Procedure: CERVICAL TWO-THREE ANTERIOR CERVICAL DISCECTOMY/DECOMPRESSION FUSION;  Surgeon: Onetha Kuba, MD;  Location: [facility] OR;  Service: Neurosurgery;  Laterality: N/A;   APPENDECTOMY  1992   BIOPSY  07/01/2022   Procedure: BIOPSY;  Surgeon: Shaaron Lamar HERO, MD;  Location: [facility];  Service: Endoscopy;;   CHOLECYSTECTOMY  1992   COLONOSCOPY WITH PROPOFOL  N/A 07/01/2022    Procedure: COLONOSCOPY WITH PROPOFOL ;  Surgeon: Shaaron Lamar HERO, MD;  [facility]: [facility];  Service: Endoscopy;  Laterality: N/A;  12:00 pm   DIAGNOSTIC LAPAROSCOPY  1992   lap chole   ESOPHAGOGASTRODUODENOSCOPY (EGD) WITH PROPOFOL  N/A 07/01/2022   Procedure: ESOPHAGOGASTRODUODENOSCOPY (EGD) WITH PROPOFOL ;  Surgeon: Shaaron Lamar HERO, MD;  [facility]: [facility];  Service: Endoscopy;  Laterality: N/A;   HEMOSTASIS CLIP PLACEMENT  07/01/2022   Procedure: HEMOSTASIS CLIP PLACEMENT;  Surgeon: Shaaron Lamar HERO, MD;  [facility]: [facility];  Service: Endoscopy;;   HERNIA REPAIR     umbilical... as a child   MALONEY DILATION  07/01/2022  Procedure: MALONEY DILATION;  Surgeon: Shaaron Lamar HERO, MD;  [facility]: [facility];  Service: Endoscopy;;   OOPHORECTOMY     POLYPECTOMY  07/01/2022   Procedure: POLYPECTOMY;  Surgeon: Shaaron Lamar HERO, MD;  [facility]: [facility];  Service: Endoscopy;;   POSTERIOR CERVICAL FUSION/FORAMINOTOMY N/A 08/07/2020   Procedure: POSTERIOR CERVICAL LAMINECTOMY FOR MYELOPATHY;  Surgeon: Onetha Kuba, MD;  Location: [facility] OR;  Service: Neurosurgery;  Laterality: N/A;   POSTERIOR CERVICAL FUSION/FORAMINOTOMY N/A 03/10/2021   Procedure: Posterior cervical fusion with lateral mass fixation Cervical one - cervical four;  Surgeon: Onetha Kuba, MD;  Location: [facility] OR;  Service: Neurosurgery;  Laterality: N/A;   POSTERIOR CERVICAL LAMINECTOMY  08/07/2020   w/ Dr. Onetha   tonsillectomy     TONSILLECTOMY     TUBAL LIGATION     Social History   Occupational History   Occupation: disabled    Employer: UNEMPLOYED  Tobacco Use   Smoking status: Every Day    Current packs/day: 2.00    Average packs/day: 2.0 packs/day for 30.0 years (60.0 ttl pk-yrs)    Types: Cigarettes   Smokeless tobacco: Never   Tobacco comments:    Pt states she is down to 1 ppd. 06/22/2022   Vaping Use   Vaping status: Never Used  Substance and Sexual Activity   Alcohol  use: No   Drug use: No   Sexual  activity: Yes    Birth control/protection: Surgical        "

## 2024-07-19 DIAGNOSIS — Z79899 Other long term (current) drug therapy: Secondary | ICD-10-CM | POA: Diagnosis not present

## 2024-07-25 ENCOUNTER — Encounter: Payer: Self-pay | Admitting: Licensed Clinical Social Worker

## 2024-07-25 ENCOUNTER — Other Ambulatory Visit (INDEPENDENT_AMBULATORY_CARE_PROVIDER_SITE_OTHER): Payer: Self-pay | Admitting: Licensed Clinical Social Worker

## 2024-07-25 NOTE — Patient Outreach (Signed)
 Complex Care Management   Visit Note  07/25/2024  Name:  Margaret Vaughn MRN: [id number] DOB: 01/19/1961  Situation: Referral received for Complex Care Management related to Mental/Behavioral Health diagnosis depression and insomnia. I obtained verbal consent from Patient.  Visit completed with Patient  on the phone  Background:   Past Medical History:  Diagnosis Date   Acid reflux    Allergies    Anemia    Asthma    Bronchitis    Cervical stenosis of spine    Chronic pain syndrome    Complication of anesthesia    difficulty waking up from anesthesia   COPD (chronic obstructive pulmonary disease) (HCC)    DDD (degenerative disc disease), lumbar    Depression with anxiety    Diabetes mellitus without complication (HCC)    Dyspnea    Dysrhythmia    PSVT   Fatigue    H/O blood clots    H/O echocardiogram 1989   Per pt, done @ [facility]- Showed mitral valve prolapse   Headache(784.0)    Heart murmur    Hemorrhoids, external    History of hiatal hernia    History of kidney stones    History of PSVT (paroxysmal supraventricular tachycardia)    HTN (hypertension)    Hyperlipidemia    Hypoxemia    Insomnia    Memory loss    Myalgia and myositis    Narcolepsy    On home O2    2-4 L @ HS   OSA (obstructive sleep apnea)    Pneumonia    Sleeping difficulty    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HCC)    Thoracalgia    Tobacco abuse    Tremor    Ulcer disease     Assessment: Patient Reported Symptoms:  Cognitive Cognitive Status: Able to follow simple commands, Alert and oriented to person, place, and time, Normal speech and language skills, Struggling with memory recall Cognitive/Intellectual Conditions Management [RPT]: None reported or documented in medical history or problem list   Health Maintenance Behaviors: Annual physical exam Healing Pattern: Slow Health Facilitated by: Rest  Neurological Neurological Review of Symptoms: Dizziness, Weakness Neurological  Management Strategies: Routine screening Neurological Self-Management Outcome: 3 (uncertain)  HEENT HEENT Symptoms Reported: No symptoms reported HEENT Management Strategies: Adequate rest, Routine screening HEENT Self-Management Outcome: 4 (good)    Cardiovascular Cardiovascular Symptoms Reported: Swelling in legs or feet, Dizziness Does patient have uncontrolled Hypertension?: Yes Is patient checking Blood Pressure at home?: No Cardiovascular Management Strategies: Medication therapy, Routine screening Cardiovascular Self-Management Outcome: 3 (uncertain)  Respiratory Respiratory Symptoms Reported: Productive cough, Wheezing Other Respiratory Symptoms: On home oxygen . Patient also uses inhalers to help with wheezing and shortness of breath., pt continues to smke cigarettes Respiratory Management Strategies: Coping strategies, Oxygen  therapy, Routine screening Respiratory Self-Management Outcome: 3 (uncertain)  Endocrine Endocrine Symptoms Reported: No symptoms reported Is patient diabetic?: No Is patient checking blood sugars at home?: No Endocrine Self-Management Outcome: 4 (good)  Gastrointestinal Gastrointestinal Symptoms Reported: Nausea, Constipation Gastrointestinal Management Strategies: Adequate rest Gastrointestinal Self-Management Outcome: 4 (good)    Genitourinary Genitourinary Symptoms Reported: Difficulty initiating stream Genitourinary Self-Management Outcome: 3 (uncertain)  Integumentary Integumentary Symptoms Reported: Bruising Skin Management Strategies: Routine screening Skin Self-Management Outcome: 4 (good)  Musculoskeletal Musculoskelatal Symptoms Reviewed: Weakness, Back pain, Limited mobility, Difficulty walking, Joint pain Musculoskeletal Management Strategies: Routine screening Falls in the past year?: Yes Number of falls in past year: 2 or more Was there an injury with Fall?: Yes Fall Risk Category Calculator:  3 Patient Fall Risk Level: High Fall Risk     Psychosocial Psychosocial Symptoms Reported: No symptoms reported Additional Psychological Details: Reports that she takes anti-depressant. She denies the need for couseling. She feels like the anti-depressent helps. Behavioral Management Strategies: Coping strategies, Adequate rest Behavioral Health Self-Management Outcome: 4 (good) Major Change/Loss/Stressor/Fears (CP): Medical condition, self Techniques to Cope with Loss/Stress/Change: Medication Quality of Family Relationships: helpful, involved, supportive Do you feel physically threatened by others?: No    07/25/2024    PHQ2-9 Depression Screening   Little interest or pleasure in doing things Not at all  Feeling down, depressed, or hopeless Several days  PHQ-2 - Total Score 1  Trouble falling or staying asleep, or sleeping too much Nearly every day  Feeling tired or having little energy Nearly every day  Poor appetite or overeating  More than half the days  Feeling bad about yourself - or that you are a failure or have let yourself or your family down Not at all  Trouble concentrating on things, such as reading the newspaper or watching television More than half the days  Moving or speaking so slowly that other people could have noticed.  Or the opposite - being so fidgety or restless that you have been moving around a lot more than usual Several days  Thoughts that you would be better off dead, or hurting yourself in some way Not at all  PHQ2-9 Total Score 12  If you checked off any problems, how difficult have these problems made it for you to do your work, take care of things at home, or get along with other people Somewhat difficult  Depression Interventions/Treatment Patient refuses Treatment, Medication    There were no vitals filed for this visit.    Medications Reviewed Today     Reviewed by Merlynn Lyle CROME, LCSW (Social Worker) on 07/25/24 at [phone]  Med List Status: <None>   Medication Order Taking? Sig Documenting  Provider Last Dose Status Informant  albuterol  (PROVENTIL ) (2.5 MG/3ML) 0.083% nebulizer solution [id number]  Take 3 mLs (2.5 mg total) by nebulization every 6 (six) hours as needed for wheezing or shortness of breath. Zelaya, Oscar A, PA-C  Active   albuterol  (VENTOLIN  HFA) 108 (90 Base) MCG/ACT inhaler [id number]  INHALE 2 PUFFS BY MOUTH EVERY 6 HOURS AS NEEDED FOR WHEEZE OR SHORTNESS OF BREATH Dettinger, Fonda LABOR, MD  Active   alendronate (FOSAMAX) 70 MG tablet [id number]  Take 70 mg by mouth once a week. [provider]  Active   AMITIZA  24 MCG capsule [id number]  TAKE 1 CAPSULE (24 MCG TOTAL) BY MOUTH 2 (TWO) TIMES DAILY WITH A MEAL.  Patient taking differently: as needed.   Kennedy Charmaine CROME, NP  Active Self           Med Note (ROBB, MELANIE A   Mon Aug 23, 2023  2:09 PM) Taking as needed  aspirin EC 81 MG tablet [id number]  Take 81 mg by mouth daily. Swallow whole. [provider]  Active Self           Med Note SEABRON, LESLIE M   Mon Nov 22, 2023  3:56 PM)    atorvastatin  (LIPITOR) 40 MG tablet [id number]  Take 1 tablet (40 mg total) by mouth daily. Dettinger, Fonda LABOR, MD  Active   b complex vitamins capsule [id number]  Take 1 capsule by mouth daily. [provider]  Active   baclofen  (LIORESAL ) 10 MG tablet [id number]  Take 10 mg by  mouth 3 (three) times daily as needed for muscle spasms. [provider]  Active Self  benzonatate  (TESSALON ) 200 MG capsule [id number]  Take 1 capsule (200 mg total) by mouth 3 (three) times daily as needed for cough. Dettinger, Fonda LABOR, MD  Active Self  budesonide -formoterol  (SYMBICORT ) 160-4.5 MCG/ACT inhaler [id number]  Inhale 2 puffs into the lungs 2 (two) times daily. Hunsucker, Donnice SAUNDERS, MD  Active   buprenorphine (SUBUTEX) 2 MG SUBL SL tablet [id number]  Place 2 mg under the tongue 2 (two) times daily. [provider]  Active   busPIRone  (BUSPAR ) 5 MG tablet [id number]  Take 1 tablet (5 mg total) by mouth 2 (two)  times daily. Dettinger, Fonda LABOR, MD  Active   Cyanocobalamin  (VITAMIN B-12 PO) [id number]  Take by mouth. [provider]  Active   DULoxetine  (CYMBALTA ) 60 MG capsule [id number]  Take 1 capsule (60 mg total) by mouth daily. Dettinger, Fonda LABOR, MD  Active   enalapril  (VASOTEC ) 5 MG tablet [id number]  Take 1 tablet (5 mg total) by mouth daily. Dettinger, Fonda LABOR, MD  Active   famotidine  (PEPCID ) 20 MG tablet [id number]  Take 1 tablet (20 mg total) by mouth 2 (two) times daily. Dettinger, Fonda LABOR, MD  Active   leptospermum manuka honey (MEDIHONEY) PSTE paste [id number]  Apply 1 Application topically daily.  Patient not taking: Reported on 05/29/2024   Dettinger, Fonda LABOR, MD  Active   levocetirizine (XYZAL ) 5 MG tablet [id number]  Take 1 tablet (5 mg total) by mouth every evening. Dettinger, Fonda LABOR, MD  Active   Multiple Vitamins-Minerals [facility] SKIN NAILS PO) [id number]  Take 3 tablets by mouth daily. [provider]  Active Self  Multiple Vitamins-Minerals (MULTIVITAMIN WITH MINERALS) tablet [id number]  Take 1 tablet by mouth daily. [provider]  Active Self  naloxone  (NARCAN ) nasal spray 4 mg/0.1 mL [id number]  Place 1 spray into the nose once. [provider]  Active Self           Med Note ([name], CHUCK KANDICE Schaumann Aug 12, 2023  7:29 PM) Never used, has at home  nystatin  (MYCOSTATIN ) 100000 UNIT/ML suspension [id number]  Take 5 mLs by mouth 4 (four) times daily as needed (thrush). [provider]  Active Self  ondansetron  (ZOFRAN ) 4 MG tablet [id number]  Take 1 tablet (4 mg total) by mouth as needed for nausea or vomiting. Take one 1 tablet every 8 hours as needed Dettinger, Fonda LABOR, MD  Active   ondansetron  (ZOFRAN -ODT) 4 MG disintegrating tablet [id number]  4mg  ODT q4 hours prn nausea/vomit Pollina, Lonni PARAS, MD  Active   Oxycodone  HCl 10 MG TABS [id number]  Take 1 tablet (10 mg total) by mouth 5 (five) times daily as needed (Take this medicine once a  day in addition to the other dosing of your medicine.). Patsey Lot, MD  Active   pregabalin  (LYRICA ) 100 MG capsule [id number]  Take 100 mg by mouth 3 (three) times daily. [provider]  Active   Pyridoxine HCl (VITAMIN B-6 PO) [id number]  Take by mouth. [provider]  Active   rizatriptan  (MAXALT ) 10 MG tablet [id number]  Take 1 tablet (10 mg total) by mouth as needed for migraine. May repeat in 2 hours if needed Dettinger, Fonda LABOR, MD  Active   SANTYL  250 UNIT/GM ointment [id number]  APPLY 1 APPLICATION TOPICALLY DAILY  Patient not taking: Reported on 05/29/2024   Dettinger, Fonda LABOR, MD  Active   sitaGLIPtin -metformin  (  JANUMET ) 50-1000 MG tablet [id number]  TAKE 1 TABLET BY MOUTH TWICE A DAY WITH FOOD Dettinger, Fonda LABOR, MD  Active   Tiotropium Bromide  Monohydrate (SPIRIVA  RESPIMAT) 2.5 MCG/ACT AERS [id number]  Inhale 2 puffs into the lungs daily. Hunsucker, Donnice SAUNDERS, MD  Active   zolpidem (AMBIEN) 5 MG tablet [id number]  Take 5 mg by mouth at bedtime as needed. [provider]  Active             Recommendation:   PCP Follow-up Continue Current Plan of Care  Follow Up Plan:   Telephone follow-up in 1 month  Lyle Rung, BSW, MSW, LCSW Licensed Clinical Social Worker [employer]   [facility] [email or web address] Direct Dial: [phone]

## 2024-07-25 NOTE — Patient Instructions (Signed)
Visit Information  Thank you for taking time to visit with me today. Please don't hesitate to contact me if I can be of assistance to you before our next scheduled appointment.  Our next appointment is by telephone on 08/23/23 at 10 am Please call the care guide team at [phone] if you need to cancel or reschedule your appointment.   Following is a copy of your care plan:   Goals Addressed             This Visit's Progress    LCSW [facility] Social Work Care Plan         Problems:   Chronic Mental Health needs related to symptoms of insomnia, stress and anxiety. GAD dx listed in chart. Patient requires Support, Education, Resources, Referrals, Advocacy, and Care Coordination, in order to meet Unmet Mental Health Needs. Social Isolation Sleep Apnea dx (in remission per pt) Patient lacks knowledge of local and available community resources and behavioral health agencies.   Clinical Goal(s): verbalize understanding of plan for management of Anxiety, Depression, Insomnia and Stress symptoms and demonstrate a reduction in symptoms. Patient will consider connecting with a provider for ongoing mental health treatment, and increase coping skills, healthy habits, self-management skills, and stress reduction      Patient will implement clinical interventions discussed today to decrease symptoms of stress and increase knowledge and/or ability of: healthy coping skills.    Clinical Interventions:  Assessed patient's previous and current treatment, coping skills, support system and barriers to care. Patient provided hx  Verbalization of feelings encouraged, motivational interviewing employed Emotional support provided, positive coping strategies explored. Establishing healthy boundaries emphasized and healthy self-care education provided Patient was educated on available mental health resources within their area that accept her insurance and offer counseling and psychiatry. Patient was advised to  contact the back of her insurance card for assistance with benefits as well. Patient educated on the difference between therapy and psychiatry per patient request. Patient reports not being interested in talk therapy.  Emotional support provided. CBT intervention implemented regarding being mentally fit by combating negative thinking and replacing it with uplifting support, hope and positivity. Patient reports taking Cymbalta  as prescribed for her symptom relief. She is also prescribed Ambien for sleep. She was successful in identifying triggers to anxiety and depression symptoms, in addition, to healthy coping skills.  Assessed social determinant of health barriers LCSW provided education on relaxation techniques such as meditation, deep breathing, massage, grounding exercises or yoga that can activate the body's relaxation response and ease symptoms of stress and anxiety. LCSW ask that when pt is struggling with difficult emotions and racing thoughts that they start this relaxation response process. LCSW provided extensive education on healthy coping skills for anxiety. SW used active and reflective listening, validated patient's feelings/concerns, and provided emotional support. LCSW provided education on healthy sleep hygiene and what that looks like. LCSW encouraged patient to implement a night time routine into their schedule that works best for them and that they are able to maintain. Advised patient to implement deep breathing/grounding/meditation/self-care exercises into their nightly routine to combat racing thoughts at night. LCSW encouraged patient to wake up at the same time each day, make their sleeping environment comfortable, exercise when able, to limit naps and to not eat or drink anything right before bed. Explained to go to bed at the same time each night and get up at the same time each morning, including on the weekends. Make sure your bedroom is quiet, dark, relaxing,  and at a  comfortable temperature. Remove electronic devices, such as TVs, computers, and smart phones, from the bedroom.    Motivational Interviewing employed Depression screen reviewed  PHQ2/ PHQ9 completed or reviewed  Mindfulness or Relaxation training provided Active listening / Reflection utilized  Advance Care and HCPOA education provided Emotional Support Provided Problem Solving /Task Center strategies reviewed Provided psychoeducation for mental health needs  Provided brief CBT  Reviewed mental health medications and discussed importance of compliance:  Quality of sleep assessed & Sleep Hygiene techniques promoted  Participation in counseling encouraged  Verbalization of feelings encouraged  Suicidal Ideation/Homicidal Ideation assessed: Patient denies SI/HI  Review resources, discussed options and provided patient information about  Mental Health Resources Inter-disciplinary care team collaboration (see longitudinal plan of care) Message sent to [facility] team and PCP regarding patient's interest in gaining additional support and guidance regarding her insomnia. Pt reports taking 5 mg of Ambien per night. Pt requesting sleep study referral. Patient denied [facility] referrals for both therapy and psychiatry on 07/25/24.   Patient Goals/Self-Care Activities: Take medications as prescribed   Attend all scheduled provider appointments Call pharmacy for medication refills 3-7 days in advance of running out of medications Perform all self care activities independently  Perform IADL's (shopping, preparing meals, housekeeping, managing finances) independently Call provider office for new concerns or questions Work with the social worker to address care coordination needs and will continue to work with the clinical team to address health care and disease management related needs call 1-800-273-TALK (toll free, 24 hour hotline) If in a crisis, CALL 988 or go to [facility] [address], [place] [phone]) Utilize healthy coping skills and supportive resources discussed Contact PCP with any questions or concerns Keep 90 percent of counseling appointments Call your insurance provider for more information about your Enhanced Benefits  Incorporate into daily practice - relaxation techniques, deep breathing exercises, and mindfulness meditation strategies. Talk about feelings with friends, family members, spiritual advisor, etc. Contact LCSW directly [phone]), if you have questions, need assistance, or if additional social work needs are identified between now and our next scheduled telephone outreach call. Call 988 for mental health hotline/crisis line if needed (24/7 available) Try techniques to reduce symptoms of anxiety/negative thinking (deep breathing, distraction, positive self talk, etc)   Follow Up Plan:  The patient has been provided with contact information for the care management team and has been advised to call with any mental health or health related questions or concerns.  The care management team will reach out to the patient again over the next 30 business  days.   If you are experiencing a Mental Health or Behavioral Health Crisis or need someone to talk to, please call the Suicide and Crisis Lifeline: 988    Patient Goals: Follow up goal         Please call the Suicide and Crisis Lifeline: 988 call the [place]  National Suicide Prevention Lifeline: 984-415-7300 or TTY: 9077755787 TTY 740-542-2799) to talk to a trained counselor call 1-800-273-TALK (toll free, 24 hour hotline) go to [facility] [address] [place] [phone]) call the [facility] Crisis Line: (940) 057-5112 call 911 if you are experiencing a Mental Health or Behavioral Health Crisis or need someone to talk to.  Patient verbalized understanding of Care plan and visit instructions communicated this  visit  Lyle Rung, BSW, MSW, LCSW Licensed Clinical Social Worker [employer]   [email or web address] Direct Dial:  [phone] ° ° °  °

## 2024-07-26 NOTE — Progress Notes (Signed)
 Called and spoke with patient and made her aware of PCP advise. She voiced understanding and will call pulmonary office.

## 2024-08-01 ENCOUNTER — Other Ambulatory Visit: Payer: Self-pay | Admitting: *Deleted

## 2024-08-01 NOTE — Patient Instructions (Signed)
 Visit Information  Thank you for taking time to visit with me today. Please don't hesitate to contact me if I can be of assistance to you before our next scheduled appointment.  Your next care management appointment is by telephone on 09-01-2024 at 10:00 am  Telephone follow-up in 1 month  Please call the care guide team at [phone] if you need to cancel, schedule, or reschedule an appointment.   Please call the Suicide and Crisis Lifeline: 988 call the [place]  National Suicide Prevention Lifeline: 2791783269 or TTY: 3617231839 TTY (352)606-9017) to talk to a trained counselor call 1-800-273-TALK (toll free, 24 hour hotline) if you are experiencing a Mental Health or Behavioral Health Crisis or need someone to talk to.  Rosina Forte, BSN RN [facility], [facility] Health RN Care Manager Direct Dial: [phone]  Fax: [phone]

## 2024-08-01 NOTE — Patient Outreach (Signed)
 Complex Care Management   Visit Note  08/01/2024  Name:  Margaret Vaughn MRN: [id number] DOB: September 13, 1960  Situation: Referral received for Complex Care Management related to COPD I obtained verbal consent from Patient.  Visit completed with Patient  on the phone  Background:   Past Medical History:  Diagnosis Date   Acid reflux    Allergies    Anemia    Asthma    Bronchitis    Cervical stenosis of spine    Chronic pain syndrome    Complication of anesthesia    difficulty waking up from anesthesia   COPD (chronic obstructive pulmonary disease) (HCC)    DDD (degenerative disc disease), lumbar    Depression with anxiety    Diabetes mellitus without complication (HCC)    Dyspnea    Dysrhythmia    PSVT   Fatigue    H/O blood clots    H/O echocardiogram 1989   Per pt, done @ [facility]- Showed mitral valve prolapse   Headache(784.0)    Heart murmur    Hemorrhoids, external    History of hiatal hernia    History of kidney stones    History of PSVT (paroxysmal supraventricular tachycardia)    HTN (hypertension)    Hyperlipidemia    Hypoxemia    Insomnia    Memory loss    Myalgia and myositis    Narcolepsy    On home O2    2-4 L @ HS   OSA (obstructive sleep apnea)    Pneumonia    Sleeping difficulty    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HCC)    Thoracalgia    Tobacco abuse    Tremor    Ulcer disease     Assessment: Patient Reported Symptoms:  Cognitive Cognitive Status: Able to follow simple commands Cognitive/Intellectual Conditions Management [RPT]: None reported or documented in medical history or problem list   Health Maintenance Behaviors: Annual physical exam Healing Pattern: Slow Health Facilitated by: Rest  Neurological Neurological Review of Symptoms: Dizziness Neurological Management Strategies: Routine screening Neurological Self-Management Outcome: 3 (uncertain)  HEENT HEENT Symptoms Reported: Ear pain HEENT Self-Management Outcome: 3 (uncertain)     Cardiovascular Cardiovascular Symptoms Reported: Dizziness, Lightheadness, Irregular pulse Does patient have uncontrolled Hypertension?: Yes Is patient checking Blood Pressure at home?: No Cardiovascular Management Strategies: Routine screening Cardiovascular Self-Management Outcome: 3 (uncertain)  Respiratory Respiratory Symptoms Reported: Shortness of breath, Wheezing, Chest tightness, Productive cough Respiratory Management Strategies: Oxygen  therapy, Routine screening Respiratory Self-Management Outcome: 3 (uncertain)  Endocrine Endocrine Symptoms Reported: No symptoms reported Is patient diabetic?: No Is patient checking blood sugars at home?: No Endocrine Self-Management Outcome: 4 (good)  Gastrointestinal Gastrointestinal Symptoms Reported: Nausea, Vomiting, Diarrhea Gastrointestinal Self-Management Outcome: 3 (uncertain)    Genitourinary Genitourinary Symptoms Reported: No symptoms reported Genitourinary Self-Management Outcome: 5 (very good)  Integumentary Integumentary Symptoms Reported: Bruising Skin Management Strategies: Routine screening Skin Self-Management Outcome: 3 (uncertain)  Musculoskeletal Musculoskelatal Symptoms Reviewed: Weakness, Unsteady gait, Back pain, Difficulty walking, Joint pain Musculoskeletal Management Strategies: Routine screening, Medication therapy Musculoskeletal Self-Management Outcome: 3 (uncertain) Falls in the past year?: Yes Number of falls in past year: 2 or more Was there an injury with Fall?: Yes Fall Risk Category Calculator: 3 Patient Fall Risk Level: High Fall Risk Patient at Risk for Falls Due to: History of fall(s), Impaired balance/gait Fall risk Follow up: Falls evaluation completed, Education provided, Falls prevention discussed  Psychosocial Psychosocial Symptoms Reported: No symptoms reported Behavioral Management Strategies: Coping strategies Behavioral Health Self-Management Outcome: 3 (uncertain)  Major  Change/Loss/Stressor/Fears (CP): Medical condition, self Techniques to Cope with Loss/Stress/Change: Diversional activities, Medication      08/01/2024    PHQ2-9 Depression Screening   Little interest or pleasure in doing things Not at all  Feeling down, depressed, or hopeless Not at all  PHQ-2 - Total Score 0  Trouble falling or staying asleep, or sleeping too much    Feeling tired or having little energy    Poor appetite or overeating     Feeling bad about yourself - or that you are a failure or have let yourself or your family down    Trouble concentrating on things, such as reading the newspaper or watching television    Moving or speaking so slowly that other people could have noticed.  Or the opposite - being so fidgety or restless that you have been moving around a lot more than usual    Thoughts that you would be better off dead, or hurting yourself in some way    PHQ2-9 Total Score    If you checked off any problems, how difficult have these problems made it for you to do your work, take care of things at home, or get along with other people    Depression Interventions/Treatment      There were no vitals filed for this visit. Pain Score: 7  Pain Type: Chronic pain Pain Location: Generalized  Medications Reviewed Today     Reviewed by Bertrum Rosina HERO, RN (Registered Nurse) on 08/01/24 at 1010  Med List Status: <None>   Medication Order Taking? Sig Documenting Provider Last Dose Status Informant  albuterol  (PROVENTIL ) (2.5 MG/3ML) 0.083% nebulizer solution [id number] Yes Take 3 mLs (2.5 mg total) by nebulization every 6 (six) hours as needed for wheezing or shortness of breath. Zelaya, Oscar A, PA-C  Active   albuterol  (VENTOLIN  HFA) 108 (90 Base) MCG/ACT inhaler [id number] Yes INHALE 2 PUFFS BY MOUTH EVERY 6 HOURS AS NEEDED FOR WHEEZE OR SHORTNESS OF BREATH Dettinger, Fonda LABOR, MD  Active   alendronate (FOSAMAX) 70 MG tablet [id number] Yes Take 70 mg by mouth once a week.  [provider]  Active   AMITIZA  24 MCG capsule [id number]  TAKE 1 CAPSULE (24 MCG TOTAL) BY MOUTH 2 (TWO) TIMES DAILY WITH A MEAL.  Patient taking differently: as needed.   Kennedy Charmaine CROME, NP  Active Self           Med Note (ROBB, MELANIE A   Mon Aug 23, 2023  2:09 PM) Taking as needed  aspirin EC 81 MG tablet [id number] Yes Take 81 mg by mouth daily. Swallow whole. [provider]  Active Self           Med Note SEABRON, LESLIE M   Mon Nov 22, 2023  3:56 PM)    atorvastatin  (LIPITOR) 40 MG tablet [id number] Yes Take 1 tablet (40 mg total) by mouth daily. Dettinger, Fonda LABOR, MD  Active   b complex vitamins capsule [id number]  Take 1 capsule by mouth daily. [provider]  Active   baclofen  (LIORESAL ) 10 MG tablet [id number] Yes Take 10 mg by mouth 3 (three) times daily as needed for muscle spasms. [provider]  Active Self  benzonatate  (TESSALON ) 200 MG capsule [id number] Yes Take 1 capsule (200 mg total) by mouth 3 (three) times daily as needed for cough. Dettinger, Fonda LABOR, MD  Active Self  budesonide -formoterol  (SYMBICORT ) 160-4.5 MCG/ACT inhaler [id number] Yes Inhale 2 puffs into the lungs 2 (two)  times daily. Hunsucker, Donnice SAUNDERS, MD  Active   buprenorphine (SUBUTEX) 2 MG SUBL SL tablet [id number] Yes Place 2 mg under the tongue 2 (two) times daily.  Patient taking differently: Place 8 mg under the tongue 2 (two) times daily.   [provider]  Active   busPIRone  (BUSPAR ) 5 MG tablet [id number] Yes Take 1 tablet (5 mg total) by mouth 2 (two) times daily. Dettinger, Fonda LABOR, MD  Active   Cyanocobalamin  (VITAMIN B-12 PO) [id number] Yes Take by mouth. [provider]  Active   DULoxetine  (CYMBALTA ) 60 MG capsule [id number] Yes Take 1 capsule (60 mg total) by mouth daily. Dettinger, Fonda LABOR, MD  Active   enalapril  (VASOTEC ) 5 MG tablet [id number] Yes Take 1 tablet (5 mg total) by mouth daily. Dettinger, Fonda LABOR, MD  Active   famotidine   (PEPCID ) 20 MG tablet [id number] Yes Take 1 tablet (20 mg total) by mouth 2 (two) times daily. Dettinger, Fonda LABOR, MD  Active   leptospermum manuka honey (MEDIHONEY) PSTE paste [id number]  Apply 1 Application topically daily.  Patient not taking: Reported on 05/29/2024   Dettinger, Fonda LABOR, MD  Active   levocetirizine (XYZAL ) 5 MG tablet [id number] Yes Take 1 tablet (5 mg total) by mouth every evening. Dettinger, Fonda LABOR, MD  Active   Multiple Vitamins-Minerals [facility] SKIN NAILS PO) [id number] Yes Take 3 tablets by mouth daily. [provider]  Active Self  Multiple Vitamins-Minerals (MULTIVITAMIN WITH MINERALS) tablet [id number] Yes Take 1 tablet by mouth daily. [provider]  Active Self  naloxone  (NARCAN ) nasal spray 4 mg/0.1 mL [id number] Yes Place 1 spray into the nose once. [provider]  Active Self           Med Note ([name], CHUCK KANDICE Schaumann Aug 12, 2023  7:29 PM) Never used, has at home  nystatin  (MYCOSTATIN ) 100000 UNIT/ML suspension [id number] Yes Take 5 mLs by mouth 4 (four) times daily as needed (thrush). [provider]  Active Self  ondansetron  (ZOFRAN ) 4 MG tablet [id number] Yes Take 1 tablet (4 mg total) by mouth as needed for nausea or vomiting. Take one 1 tablet every 8 hours as needed Dettinger, Fonda LABOR, MD  Active   ondansetron  (ZOFRAN -ODT) 4 MG disintegrating tablet [id number] Yes 4mg  ODT q4 hours prn nausea/vomit Pollina, Lonni PARAS, MD  Active   Oxycodone  HCl 10 MG TABS [id number] Yes Take 1 tablet (10 mg total) by mouth 5 (five) times daily as needed (Take this medicine once a day in addition to the other dosing of your medicine.). Patsey Lot, MD  Active   pregabalin  (LYRICA ) 100 MG capsule [id number] Yes Take 100 mg by mouth 3 (three) times daily. [provider]  Active   Pyridoxine HCl (VITAMIN B-6 PO) [id number]  Take by mouth. [provider]  Active   rizatriptan  (MAXALT ) 10 MG tablet [id number] Yes Take 1 tablet  (10 mg total) by mouth as needed for migraine. May repeat in 2 hours if needed Dettinger, Fonda LABOR, MD  Active   SANTYL  250 UNIT/GM ointment [id number]  APPLY 1 APPLICATION TOPICALLY DAILY  Patient not taking: Reported on 05/29/2024   Dettinger, Fonda LABOR, MD  Active   sitaGLIPtin -metformin  (JANUMET ) 50-1000 MG tablet [id number] Yes TAKE 1 TABLET BY MOUTH TWICE A DAY WITH FOOD Dettinger, Fonda LABOR, MD  Active   Tiotropium Bromide  Monohydrate (SPIRIVA  RESPIMAT) 2.5 MCG/ACT AERS [id number] Yes Inhale 2 puffs into the lungs daily. Hunsucker, Donnice SAUNDERS, MD  Active  zolpidem (AMBIEN) 5 MG tablet [id number] Yes Take 5 mg by mouth at bedtime as needed. [provider]  Active             Recommendation:   Continue Current Plan of Care  Follow Up Plan:   Telephone follow-up in 1 month  Rosina Forte, BSN RN [facility], [facility] RN Care Manager Direct Dial: [phone]  Fax: [phone]

## 2024-08-03 ENCOUNTER — Other Ambulatory Visit: Payer: Self-pay

## 2024-08-03 ENCOUNTER — Ambulatory Visit: Admitting: Physician Assistant

## 2024-08-03 DIAGNOSIS — S92355A Nondisplaced fracture of fifth metatarsal bone, left foot, initial encounter for closed fracture: Secondary | ICD-10-CM

## 2024-08-03 DIAGNOSIS — M79672 Pain in left foot: Secondary | ICD-10-CM | POA: Diagnosis not present

## 2024-08-03 NOTE — Progress Notes (Signed)
 "  Office Visit Note   Patient: Margaret Vaughn           Date of Birth: 11-02-1960           MRN: [id number] Visit Date: 08/03/2024              Requested by: Dettinger, Fonda LABOR, MD [address] [place],  [place] [address] PCP: Dettinger, Fonda LABOR, MD  Chief Complaint  Patient presents with   Left Foot - Follow-up    5th MT fx       HPI: 64 y/o female with recent injury from a nightstand falling on her left foot.  She sustained a non displaced left fifth MT base fracture.  November 21st confirmed the fracture, and follow-up radiographs on December 10th showed no change.   She is on Fosamax for osteoporosis and she is a diabetic.  He most recent A1 c  was 5.2.      Assessment & Plan: Visit Diagnoses:  1. Pain in left foot   2. Closed nondisplaced fracture of fifth metatarsal bone of left foot, initial encounter     Plan: The left MT base of fifth fracture is showing signs of healing with callus formation.   She will get carbon fiber inserts verse stiff soled shoes to protect of feet from recurrent or new pathological fractures.   She will continue to take Fosamax for osteoporosis.   Follow-Up Instructions: Return if symptoms worsen or fail to improve.   Ortho Exam  Patient is alert, oriented, no adenopathy, well-dressed, normal affect, normal respiratory effort. Left foot without edema or cellulitis.  Non tender to palpation at the base of the left fifth MT.      Imaging: X rays show callus formation over the base of the fifth MT fracture without change in alignment.  Labs: Lab Results  Component Value Date   HGBA1C 5.4 06/16/2024   HGBA1C 5.4 02/25/2024   HGBA1C 5.3 07/16/2023   REPTSTATUS 12/22/2018 FINAL 12/21/2018   CULT (A) 12/21/2018    <10,000 COLONIES/mL INSIGNIFICANT GROWTH Performed at [facility], [address], [place], [place] [address]      Lab Results  Component Value Date   ALBUMIN 3.8 (L) 06/16/2024   ALBUMIN 3.9 03/24/2024    ALBUMIN 3.7 (L) 02/25/2024    Lab Results  Component Value Date   MG 1.7 09/10/2023   Lab Results  Component Value Date   VD25OH 52.5 03/20/2022    No results found for: PREALBUMIN    Latest Ref Rng & Units 02/25/2024    3:02 PM 02/18/2024    4:17 PM 10/15/2023    3:53 PM  CBC EXTENDED  WBC 3.4 - 10.8 x10E3/uL 8.1  8.6  8.6   RBC 3.77 - 5.28 x10E6/uL 4.20  4.24  3.94   Hemoglobin 11.1 - 15.9 g/dL 87.4  87.8  88.5   HCT 34.0 - 46.6 % 38.7  39.4  36.6   Platelets 150 - 450 x10E3/uL 386  354  411   NEUT# 1.4 - 7.0 x10E3/uL 5.8  5.9  5.9   Lymph# 0.7 - 3.1 x10E3/uL 1.3  1.4  1.4      There is no height or weight on file to calculate BMI.  Orders:  Orders Placed This Encounter  Procedures   XR Foot Complete Left   No orders of the defined types were placed in this encounter.    Procedures: No procedures performed  Clinical Data: No additional  findings.  ROS:  All other systems negative, except as noted in the HPI. Review of Systems  Objective: Vital Signs: There were no vitals taken for this visit.  Specialty Comments:  No specialty comments available.  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   Myelopathy (HCC) 03/10/2021   HNP (herniated nucleus pulposus) with myelopathy, cervical 12/18/2020   Cervical myelopathy (HCC) 08/13/2020   Cervical spinal cord compression (HCC) 08/08/2020   Chronic respiratory failure with hypoxia (HCC) 08/08/2020   Type 2 diabetes mellitus with diabetic polyneuropathy, without long-term current use of insulin  (HCC) 08/08/2020   Fibromyalgia 01/06/2019   Neuropathy 01/06/2019   Overweight 03/11/2018   COPD (chronic obstructive pulmonary disease) (HCC) 03/11/2018   Mixed diabetic hyperlipidemia associated with type 2 diabetes mellitus (HCC) 03/11/2018   PTSD (post-traumatic stress  disorder) 03/11/2018   Myofascial pain 09/23/2017   GAD (generalized anxiety disorder) 01/30/2014   Hypertension associated with diabetes (HCC) 01/30/2014   OSA (obstructive sleep apnea) 11/23/2013   Dysthymic disorder 03/22/2013   Memory loss 03/22/2013   Hyperlipidemia associated with type 2 diabetes mellitus (HCC) 08/10/2012   Tobacco abuse 08/10/2012   GERD 12/24/2009   Past Medical History:  Diagnosis Date   Acid reflux    Allergies    Anemia    Asthma    Bronchitis    Cervical stenosis of spine    Chronic pain syndrome    Complication of anesthesia    difficulty waking up from anesthesia   COPD (chronic obstructive pulmonary disease) (HCC)    DDD (degenerative disc disease), lumbar    Depression with anxiety    Diabetes mellitus without complication (HCC)    Dyspnea    Dysrhythmia    PSVT   Fatigue    H/O blood clots    H/O echocardiogram 1989   Per pt, done @ [facility]- Showed mitral valve prolapse   Headache(784.0)    Heart murmur    Hemorrhoids, external    History of hiatal hernia    History of kidney stones    History of PSVT (paroxysmal supraventricular tachycardia)    HTN (hypertension)    Hyperlipidemia    Hypoxemia    Insomnia    Memory loss    Myalgia and myositis    Narcolepsy    On home O2    2-4 L @ HS   OSA (obstructive sleep apnea)    Pneumonia    Sleeping difficulty    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HCC)    Thoracalgia    Tobacco abuse    Tremor    Ulcer disease     Family History  Problem Relation Age of Onset   Ulcers Mother    Sleep apnea Mother    Migraines Mother    Cancer Mother        uterine   Arthritis Mother    Hypertension Mother    Heart disease Mother    Depression Mother    Hyperlipidemia Mother    Colon cancer Mother    Arrhythmia Son     Past Surgical History:  Procedure Laterality Date   ABDOMINAL HYSTERECTOMY     ANTERIOR CERVICAL DECOMP/DISCECTOMY FUSION N/A 12/18/2020   Procedure: CERVICAL TWO-THREE  ANTERIOR CERVICAL DISCECTOMY/DECOMPRESSION FUSION;  Surgeon: Onetha Kuba, MD;  Location: [facility] OR;  Service: Neurosurgery;  Laterality: N/A;   APPENDECTOMY  1992   BIOPSY  07/01/2022  Procedure: BIOPSY;  Surgeon: Shaaron Lamar HERO, MD;  Location: [facility];  Service: Endoscopy;;   CHOLECYSTECTOMY  1992   COLONOSCOPY WITH PROPOFOL  N/A 07/01/2022   Procedure: COLONOSCOPY WITH PROPOFOL ;  Surgeon: Shaaron Lamar HERO, MD;  Location: [facility];  Service: Endoscopy;  Laterality: N/A;  12:00 pm   DIAGNOSTIC LAPAROSCOPY  1992   lap chole   ESOPHAGOGASTRODUODENOSCOPY (EGD) WITH PROPOFOL  N/A 07/01/2022   Procedure: ESOPHAGOGASTRODUODENOSCOPY (EGD) WITH PROPOFOL ;  Surgeon: Shaaron Lamar HERO, MD;  [facility]: [facility];  Service: Endoscopy;  Laterality: N/A;   HEMOSTASIS CLIP PLACEMENT  07/01/2022   Procedure: HEMOSTASIS CLIP PLACEMENT;  Surgeon: Shaaron Lamar HERO, MD;  Location: [facility];  Service: Endoscopy;;   HERNIA REPAIR     umbilical... as a child   MALONEY DILATION  07/01/2022   Procedure: MALONEY DILATION;  Surgeon: Shaaron Lamar HERO, MD;  Location: [facility];  Service: Endoscopy;;   OOPHORECTOMY     POLYPECTOMY  07/01/2022   Procedure: POLYPECTOMY;  Surgeon: Shaaron Lamar HERO, MD;  Location: [facility];  Service: Endoscopy;;   POSTERIOR CERVICAL FUSION/FORAMINOTOMY N/A 08/07/2020   Procedure: POSTERIOR CERVICAL LAMINECTOMY FOR MYELOPATHY;  Surgeon: Onetha Kuba, MD;  Location: [facility] OR;  Service: Neurosurgery;  Laterality: N/A;   POSTERIOR CERVICAL FUSION/FORAMINOTOMY N/A 03/10/2021   Procedure: Posterior cervical fusion with lateral mass fixation Cervical one - cervical four;  Surgeon: Onetha Kuba, MD;  Location: [facility] OR;  Service: Neurosurgery;  Laterality: N/A;   POSTERIOR CERVICAL LAMINECTOMY  08/07/2020   w/ Dr. Onetha   tonsillectomy     TONSILLECTOMY     TUBAL LIGATION     Social History   Occupational History   Occupation: disabled    Employer: UNEMPLOYED  Tobacco Use   Smoking status:  Every Day    Current packs/day: 2.00    Average packs/day: 2.0 packs/day for 30.0 years (60.0 ttl pk-yrs)    Types: Cigarettes   Smokeless tobacco: Never   Tobacco comments:    Pt states she is down to 1 ppd. 06/22/2022   Vaping Use   Vaping status: Never Used  Substance and Sexual Activity   Alcohol  use: No   Drug use: No   Sexual activity: Yes    Birth control/protection: Surgical       "

## 2024-08-04 ENCOUNTER — Telehealth: Admitting: Family Medicine

## 2024-08-04 ENCOUNTER — Encounter: Payer: Self-pay | Admitting: Physician Assistant

## 2024-08-04 ENCOUNTER — Encounter: Payer: Self-pay | Admitting: Family Medicine

## 2024-08-04 DIAGNOSIS — J441 Chronic obstructive pulmonary disease with (acute) exacerbation: Secondary | ICD-10-CM | POA: Diagnosis not present

## 2024-08-04 DIAGNOSIS — J449 Chronic obstructive pulmonary disease, unspecified: Secondary | ICD-10-CM

## 2024-08-04 MED ORDER — BENZONATATE 200 MG PO CAPS: 200.0000 mg | ORAL_CAPSULE | Freq: Three times a day (TID) | ORAL | 1 refills | Status: AC | PRN

## 2024-08-04 MED ORDER — AMOXICILLIN-POT CLAVULANATE 875-125 MG PO TABS: 1.0000 | ORAL_TABLET | Freq: Two times a day (BID) | ORAL | 0 refills | Status: AC

## 2024-08-04 MED ORDER — PREDNISONE 20 MG PO TABS: ORAL_TABLET | ORAL | 0 refills | Status: AC

## 2024-08-04 NOTE — Progress Notes (Signed)
 "  Virtual Visit via MyChart video note  I connected with Margaret Vaughn on 08/04/2024 at 1653 by video and verified that I am speaking with the correct person using two identifiers. Margaret Vaughn is currently located at home and husband are currently with her during visit. The provider, Fonda LABOR Nydia Ytuarte, MD is located in their office at time of visit.  Call ended at 1702  I discussed the limitations, risks, security and privacy concerns of performing an evaluation and management service by video and the availability of in person appointments. I also discussed with the patient that there may be a patient responsible charge related to this service. The patient expressed understanding and agreed to proceed.   History and Present Illness:  Discussed the use of AI scribe software for clinical note transcription with the patient, who gave verbal consent to proceed.  History of Present Illness   Margaret Vaughn is a 64 year old female with COPD who presents with sinus congestion and respiratory symptoms.  Sinonasal congestion - Significant sinus congestion with yellow nasal discharge - Sinus pressure radiating to the ears - Temporary relief with sinus irrigation, but symptoms quickly recur - Chills present - No fever or body aches  Respiratory symptoms in copd - Increased wheezing, coughing fits, and chest congestion - Persistent congestion and coughing despite daily Mucinex  and regular breathing treatments - Weight loss of three pounds over the past month due to decreased appetite  Oxygen  therapy and equipment - Relies on oxygen  therapy, typically at four liters per minute, especially with exertion such as walking to the bathroom - Oxygen  unit is old and has not been serviced in almost a year, possibly affecting efficiency  Current medications and symptom management - Uses Mucinex  daily and regular inhalers, which provide some symptom relief - Uses oxygen  therapy as needed          Outpatient Encounter Medications as of 08/04/2024  Medication Sig   amoxicillin -clavulanate (AUGMENTIN ) 875-125 MG tablet Take 1 tablet by mouth 2 (two) times daily.   predniSONE  (DELTASONE ) 20 MG tablet 2 po at same time daily for 5 days   albuterol  (PROVENTIL ) (2.5 MG/3ML) 0.083% nebulizer solution Take 3 mLs (2.5 mg total) by nebulization every 6 (six) hours as needed for wheezing or shortness of breath.   albuterol  (VENTOLIN  HFA) 108 (90 Base) MCG/ACT inhaler INHALE 2 PUFFS BY MOUTH EVERY 6 HOURS AS NEEDED FOR WHEEZE OR SHORTNESS OF BREATH   alendronate (FOSAMAX) 70 MG tablet Take 70 mg by mouth once a week.   AMITIZA  24 MCG capsule TAKE 1 CAPSULE (24 MCG TOTAL) BY MOUTH 2 (TWO) TIMES DAILY WITH A MEAL. (Patient taking differently: as needed.)   aspirin EC 81 MG tablet Take 81 mg by mouth daily. Swallow whole.   atorvastatin  (LIPITOR) 40 MG tablet Take 1 tablet (40 mg total) by mouth daily.   b complex vitamins capsule Take 1 capsule by mouth daily.   baclofen  (LIORESAL ) 10 MG tablet Take 10 mg by mouth 3 (three) times daily as needed for muscle spasms.   benzonatate  (TESSALON ) 200 MG capsule Take 1 capsule (200 mg total) by mouth 3 (three) times daily as needed for cough.   budesonide -formoterol  (SYMBICORT ) 160-4.5 MCG/ACT inhaler Inhale 2 puffs into the lungs 2 (two) times daily.   buprenorphine (SUBUTEX) 2 MG SUBL SL tablet Place 2 mg under the tongue 2 (two) times daily. (Patient taking differently: Place 8 mg under the tongue 2 (two) times daily.)  busPIRone  (BUSPAR ) 5 MG tablet Take 1 tablet (5 mg total) by mouth 2 (two) times daily.   Cyanocobalamin  (VITAMIN B-12 PO) Take by mouth.   DULoxetine  (CYMBALTA ) 60 MG capsule Take 1 capsule (60 mg total) by mouth daily.   enalapril  (VASOTEC ) 5 MG tablet Take 1 tablet (5 mg total) by mouth daily.   famotidine  (PEPCID ) 20 MG tablet Take 1 tablet (20 mg total) by mouth 2 (two) times daily.   leptospermum manuka honey (MEDIHONEY) PSTE  paste Apply 1 Application topically daily. (Patient not taking: Reported on 05/29/2024)   levocetirizine (XYZAL ) 5 MG tablet Take 1 tablet (5 mg total) by mouth every evening.   Multiple Vitamins-Minerals (HAIR SKIN NAILS PO) Take 3 tablets by mouth daily.   Multiple Vitamins-Minerals (MULTIVITAMIN WITH MINERALS) tablet Take 1 tablet by mouth daily.   naloxone  (NARCAN ) nasal spray 4 mg/0.1 mL Place 1 spray into the nose once.   nystatin  (MYCOSTATIN ) 100000 UNIT/ML suspension Take 5 mLs by mouth 4 (four) times daily as needed (thrush).   ondansetron  (ZOFRAN ) 4 MG tablet Take 1 tablet (4 mg total) by mouth as needed for nausea or vomiting. Take one 1 tablet every 8 hours as needed   ondansetron  (ZOFRAN -ODT) 4 MG disintegrating tablet 4mg  ODT q4 hours prn nausea/vomit   Oxycodone  HCl 10 MG TABS Take 1 tablet (10 mg total) by mouth 5 (five) times daily as needed (Take this medicine once a day in addition to the other dosing of your medicine.).   pregabalin  (LYRICA ) 100 MG capsule Take 100 mg by mouth 3 (three) times daily.   Pyridoxine HCl (VITAMIN B-6 PO) Take by mouth.   rizatriptan  (MAXALT ) 10 MG tablet Take 1 tablet (10 mg total) by mouth as needed for migraine. May repeat in 2 hours if needed   SANTYL  250 UNIT/GM ointment APPLY 1 APPLICATION TOPICALLY DAILY (Patient not taking: Reported on 05/29/2024)   sitaGLIPtin -metformin  (JANUMET ) 50-1000 MG tablet TAKE 1 TABLET BY MOUTH TWICE A DAY WITH FOOD   Tiotropium Bromide  Monohydrate (SPIRIVA  RESPIMAT) 2.5 MCG/ACT AERS Inhale 2 puffs into the lungs daily.   zolpidem (AMBIEN) 5 MG tablet Take 5 mg by mouth at bedtime as needed.   [DISCONTINUED] benzonatate  (TESSALON ) 200 MG capsule Take 1 capsule (200 mg total) by mouth 3 (three) times daily as needed for cough.   No facility-administered encounter medications on file as of 08/04/2024.    Review of Systems  Constitutional:  Positive for chills. Negative for fever.  HENT:  Positive for congestion,  postnasal drip, rhinorrhea and sinus pressure. Negative for ear discharge, ear pain, sneezing and sore throat.   Eyes:  Negative for pain, redness and visual disturbance.  Respiratory:  Positive for cough, shortness of breath and wheezing. Negative for chest tightness.   Cardiovascular:  Negative for chest pain and leg swelling.  Genitourinary:  Negative for difficulty urinating and dysuria.  Musculoskeletal:  Positive for myalgias. Negative for back pain and gait problem.  Skin:  Negative for rash.  Neurological:  Negative for light-headedness and headaches.  Psychiatric/Behavioral:  Negative for agitation and behavioral problems.   All other systems reviewed and are negative.   Observations/Objective: Patient is comfortable and in no acute distress  Assessment and Plan: Problem List Items Addressed This Visit       Respiratory   COPD (chronic obstructive pulmonary disease) (HCC)   Relevant Medications   amoxicillin -clavulanate (AUGMENTIN ) 875-125 MG tablet   predniSONE  (DELTASONE ) 20 MG tablet   benzonatate  (TESSALON ) 200 MG  capsule   Other Visit Diagnoses       COPD exacerbation (HCC)    -  Primary   Relevant Medications   amoxicillin -clavulanate (AUGMENTIN ) 875-125 MG tablet   predniSONE  (DELTASONE ) 20 MG tablet   benzonatate  (TESSALON ) 200 MG capsule           Chronic obstructive pulmonary disease with acute exacerbation COPD exacerbation with increased chest congestion, coughing fits, and increased oxygen  requirement. Inhalers and oxygen  therapy provide relief. Oxygen  unit is outdated and requires maintenance. - Prescribed prednisone  for inflammation. - Continue current inhaler regimen. - Ensure adequate hydration. - Advised to monitor symptoms closely and seek medical attention if symptoms worsen.  Acute sinusitis Sinus congestion with yellow discharge, sinus pressure, and ear involvement. Symptoms suggestive of bacterial sinusitis. - Prescribed amoxicillin  for  sinus infection. - Advised to monitor symptoms and seek medical attention if symptoms worsen.         Follow up plan: Return if symptoms worsen or fail to improve.     I discussed the assessment and treatment plan with the patient. The patient was provided an opportunity to ask questions and all were answered. The patient agreed with the plan and demonstrated an understanding of the instructions.   The patient was advised to call back or seek an in-person evaluation if the symptoms worsen or if the condition fails to improve as anticipated.  The above assessment and management plan was discussed with the patient. The patient verbalized understanding of and has agreed to the management plan. Patient is aware to call the clinic if symptoms persist or worsen. Patient is aware when to return to the clinic for a follow-up visit. Patient educated on when it is appropriate to go to the emergency department.    I provided 9 minutes of non-face-to-face time during this encounter.    Fonda DELENA Levins, MD    "

## 2024-08-05 ENCOUNTER — Other Ambulatory Visit: Payer: Self-pay | Admitting: Family Medicine

## 2024-08-05 DIAGNOSIS — F411 Generalized anxiety disorder: Secondary | ICD-10-CM

## 2024-08-17 ENCOUNTER — Telehealth: Payer: Self-pay | Admitting: Family Medicine

## 2024-08-17 NOTE — Telephone Encounter (Unsigned)
 Copied from CRM #[id number]. Topic: Clinical - Prescription Issue >> Aug 16, 2024  3:00 PM Susanna ORN wrote: Reason for CRM: Patient states she's being trying to contact the pharmacy about her DULoxetine  (CYMBALTA ) 60 MG capsule prescription issue but can't reach them. States that she last got it in December & was given a 30 day supply instead of a 90 day supply. States she thought her prescription was always for 90 day supply. The pharmacy told her that she can't get another refill until March but patient states she only received a 30 day supply. Patient states that if she can;t get her medication, which regulates her mood, then she will start crying & her mood will change & no one nor her family will want to be around her. She wants to see if Dr. Maryanne can help with this issue or does he have any samples in the office that she could get? Please give patient a call back to advise. CB #: [phone].

## 2024-08-18 NOTE — Telephone Encounter (Signed)
 Spoke with pt and made aware that she has enough refills until Aug. Pt understood and states that she has already resolved the issue. She has no further concerns.

## 2024-08-22 ENCOUNTER — Telehealth: Payer: Self-pay | Admitting: Licensed Clinical Social Worker

## 2024-08-22 ENCOUNTER — Encounter: Payer: Self-pay | Admitting: Licensed Clinical Social Worker

## 2024-08-22 NOTE — Patient Instructions (Signed)
 Tawni ONEIDA Coder - I am sorry I was unable to reach you today for our scheduled appointment. I work with Dettinger, Fonda LABOR, MD and am calling to support your healthcare needs. Please contact me at [phone] at your earliest convenience. I look forward to speaking with you soon.   Thank you,  Lyle Rung, BSW, MSW, LCSW Licensed Clinical Social Worker [employer]   [facility] [email or web address] Direct Dial: [phone]

## 2024-09-01 ENCOUNTER — Telehealth: Payer: Self-pay | Admitting: *Deleted

## 2024-09-01 ENCOUNTER — Encounter: Payer: Self-pay | Admitting: *Deleted

## 2024-09-01 NOTE — Patient Instructions (Signed)
 Tawni ONEIDA Coder - I am sorry I was unable to reach you today for our scheduled appointment. I work with Dettinger, Fonda LABOR, MD and am calling to support your healthcare needs. Please contact me at [phone] at your earliest convenience. I look forward to speaking with you soon.   Thank you,  Rosina Forte, BSN RN [facility] RN Care Manager Direct Dial: [phone]  Fax: [phone]

## 2024-09-05 ENCOUNTER — Other Ambulatory Visit: Admitting: Licensed Clinical Social Worker

## 2024-09-07 ENCOUNTER — Telehealth: Payer: Self-pay | Admitting: *Deleted

## 2024-09-18 ENCOUNTER — Ambulatory Visit: Admitting: Family Medicine
